# Patient Record
Sex: Female | Born: 1955 | Race: White | Hispanic: No | Marital: Married | Smoking: Former smoker
Health system: Southern US, Community
[De-identification: ages and names within clinical notes are randomized; demographics above are authoritative.]

## PROBLEM LIST (undated history)

## (undated) DIAGNOSIS — N3946 Mixed incontinence: Secondary | ICD-10-CM

## (undated) DIAGNOSIS — Z7189 Other specified counseling: Secondary | ICD-10-CM

## (undated) DIAGNOSIS — R232 Flushing: Secondary | ICD-10-CM

## (undated) DIAGNOSIS — E039 Hypothyroidism, unspecified: Secondary | ICD-10-CM

## (undated) DIAGNOSIS — B369 Superficial mycosis, unspecified: Secondary | ICD-10-CM

## (undated) DIAGNOSIS — R4589 Other symptoms and signs involving emotional state: Secondary | ICD-10-CM

## (undated) DIAGNOSIS — Z8489 Family history of other specified conditions: Secondary | ICD-10-CM

## (undated) DIAGNOSIS — M1990 Unspecified osteoarthritis, unspecified site: Secondary | ICD-10-CM

## (undated) DIAGNOSIS — F32A Depression, unspecified: Secondary | ICD-10-CM

## (undated) DIAGNOSIS — Z9889 Other specified postprocedural states: Secondary | ICD-10-CM

## (undated) DIAGNOSIS — E119 Type 2 diabetes mellitus without complications: Secondary | ICD-10-CM

## (undated) DIAGNOSIS — R7301 Impaired fasting glucose: Secondary | ICD-10-CM

## (undated) DIAGNOSIS — E65 Localized adiposity: Secondary | ICD-10-CM

## (undated) DIAGNOSIS — F329 Major depressive disorder, single episode, unspecified: Secondary | ICD-10-CM

## (undated) DIAGNOSIS — N816 Rectocele: Secondary | ICD-10-CM

## (undated) DIAGNOSIS — E669 Obesity, unspecified: Secondary | ICD-10-CM

## (undated) DIAGNOSIS — I10 Essential (primary) hypertension: Secondary | ICD-10-CM

## (undated) DIAGNOSIS — R0602 Shortness of breath: Secondary | ICD-10-CM

## (undated) DIAGNOSIS — K219 Gastro-esophageal reflux disease without esophagitis: Secondary | ICD-10-CM

## (undated) DIAGNOSIS — R112 Nausea with vomiting, unspecified: Secondary | ICD-10-CM

## (undated) DIAGNOSIS — C539 Malignant neoplasm of cervix uteri, unspecified: Secondary | ICD-10-CM

## (undated) DIAGNOSIS — J45909 Unspecified asthma, uncomplicated: Secondary | ICD-10-CM

## (undated) DIAGNOSIS — E7800 Pure hypercholesterolemia, unspecified: Secondary | ICD-10-CM

## (undated) DIAGNOSIS — N393 Stress incontinence (female) (male): Secondary | ICD-10-CM

## (undated) HISTORY — DX: Superficial mycosis, unspecified: B36.9

## (undated) HISTORY — DX: Impaired fasting glucose: R73.01

## (undated) HISTORY — DX: Mixed incontinence: N39.46

## (undated) HISTORY — DX: Rectocele: N81.6

## (undated) HISTORY — DX: Other specified counseling: Z71.89

## (undated) HISTORY — DX: Essential (primary) hypertension: I10

## (undated) HISTORY — DX: Localized adiposity: E65

## (undated) HISTORY — PX: HAMMER TOE SURGERY: SHX385

## (undated) HISTORY — PX: JOINT REPLACEMENT: SHX530

## (undated) HISTORY — DX: Other symptoms and signs involving emotional state: R45.89

## (undated) HISTORY — DX: Obesity, unspecified: E66.9

## (undated) HISTORY — PX: KNEE ARTHROSCOPY: SUR90

## (undated) HISTORY — PX: TUBAL LIGATION: SHX77

## (undated) HISTORY — DX: Stress incontinence (female) (male): N39.3

## (undated) HISTORY — DX: Pure hypercholesterolemia, unspecified: E78.00

## (undated) HISTORY — PX: COLONOSCOPY: SHX174

## (undated) HISTORY — PX: FUSION OF TALONAVICULAR JOINT: SHX6332

## (undated) HISTORY — PX: BACK SURGERY: SHX140

## (undated) HISTORY — DX: Flushing: R23.2

---
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
## 1984-03-27 HISTORY — PX: ABDOMINAL HYSTERECTOMY: SHX81

## 1999-04-20 ENCOUNTER — Other Ambulatory Visit: Admission: RE | Admit: 1999-04-20 | Discharge: 1999-04-20 | Payer: Self-pay | Admitting: Obstetrics and Gynecology

## 2001-04-19 ENCOUNTER — Ambulatory Visit (HOSPITAL_COMMUNITY): Admission: RE | Admit: 2001-04-19 | Discharge: 2001-04-19 | Payer: Self-pay | Admitting: Obstetrics and Gynecology

## 2001-04-19 ENCOUNTER — Encounter: Payer: Self-pay | Admitting: Obstetrics and Gynecology

## 2001-04-19 ENCOUNTER — Other Ambulatory Visit: Admission: RE | Admit: 2001-04-19 | Discharge: 2001-04-19 | Payer: Self-pay | Admitting: Obstetrics and Gynecology

## 2001-07-05 ENCOUNTER — Ambulatory Visit (HOSPITAL_COMMUNITY): Admission: RE | Admit: 2001-07-05 | Discharge: 2001-07-05 | Payer: Self-pay | Admitting: Family Medicine

## 2001-07-05 ENCOUNTER — Encounter: Payer: Self-pay | Admitting: Family Medicine

## 2002-03-27 HISTORY — PX: LUMBAR DISC SURGERY: SHX700

## 2002-06-03 ENCOUNTER — Ambulatory Visit (HOSPITAL_COMMUNITY): Admission: RE | Admit: 2002-06-03 | Discharge: 2002-06-03 | Payer: Self-pay | Admitting: Family Medicine

## 2003-02-04 ENCOUNTER — Ambulatory Visit (HOSPITAL_COMMUNITY): Admission: RE | Admit: 2003-02-04 | Discharge: 2003-02-04 | Payer: Self-pay | Admitting: Family Medicine

## 2003-02-23 ENCOUNTER — Inpatient Hospital Stay (HOSPITAL_COMMUNITY): Admission: RE | Admit: 2003-02-23 | Discharge: 2003-02-24 | Payer: Self-pay | Admitting: Neurosurgery

## 2003-03-23 ENCOUNTER — Ambulatory Visit (HOSPITAL_COMMUNITY): Admission: RE | Admit: 2003-03-23 | Discharge: 2003-03-23 | Payer: Self-pay | Admitting: Obstetrics and Gynecology

## 2003-06-03 ENCOUNTER — Ambulatory Visit (HOSPITAL_COMMUNITY): Admission: RE | Admit: 2003-06-03 | Discharge: 2003-06-03 | Payer: Self-pay | Admitting: Neurosurgery

## 2003-06-15 ENCOUNTER — Ambulatory Visit (HOSPITAL_COMMUNITY): Admission: RE | Admit: 2003-06-15 | Discharge: 2003-06-15 | Payer: Self-pay | Admitting: Neurosurgery

## 2003-06-23 ENCOUNTER — Ambulatory Visit (HOSPITAL_COMMUNITY): Admission: RE | Admit: 2003-06-23 | Discharge: 2003-06-23 | Payer: Self-pay | Admitting: Neurosurgery

## 2003-07-06 ENCOUNTER — Encounter: Admission: RE | Admit: 2003-07-06 | Discharge: 2003-07-06 | Payer: Self-pay | Admitting: Infectious Diseases

## 2003-07-08 ENCOUNTER — Ambulatory Visit (HOSPITAL_COMMUNITY): Admission: RE | Admit: 2003-07-08 | Discharge: 2003-07-08 | Payer: Self-pay | Admitting: Infectious Diseases

## 2003-07-10 ENCOUNTER — Ambulatory Visit (HOSPITAL_COMMUNITY): Admission: RE | Admit: 2003-07-10 | Discharge: 2003-07-10 | Payer: Self-pay | Admitting: Infectious Diseases

## 2003-07-28 ENCOUNTER — Encounter: Admission: RE | Admit: 2003-07-28 | Discharge: 2003-07-28 | Payer: Self-pay | Admitting: Infectious Diseases

## 2003-08-18 ENCOUNTER — Encounter: Admission: RE | Admit: 2003-08-18 | Discharge: 2003-08-18 | Payer: Self-pay | Admitting: Infectious Diseases

## 2003-08-25 ENCOUNTER — Encounter: Admission: RE | Admit: 2003-08-25 | Discharge: 2003-08-25 | Payer: Self-pay | Admitting: Infectious Diseases

## 2003-10-07 ENCOUNTER — Encounter: Admission: RE | Admit: 2003-10-07 | Discharge: 2003-10-07 | Payer: Self-pay | Admitting: Infectious Diseases

## 2003-11-10 ENCOUNTER — Encounter: Admission: RE | Admit: 2003-11-10 | Discharge: 2003-11-10 | Payer: Self-pay | Admitting: Infectious Diseases

## 2004-01-06 ENCOUNTER — Ambulatory Visit: Payer: Self-pay | Admitting: Infectious Diseases

## 2004-02-04 ENCOUNTER — Ambulatory Visit (HOSPITAL_COMMUNITY): Admission: RE | Admit: 2004-02-04 | Discharge: 2004-02-04 | Payer: Self-pay | Admitting: Family Medicine

## 2004-02-16 ENCOUNTER — Ambulatory Visit (HOSPITAL_COMMUNITY): Admission: RE | Admit: 2004-02-16 | Discharge: 2004-02-16 | Payer: Self-pay | Admitting: Family Medicine

## 2004-05-18 ENCOUNTER — Ambulatory Visit: Payer: Self-pay | Admitting: Infectious Diseases

## 2006-03-12 ENCOUNTER — Ambulatory Visit (HOSPITAL_COMMUNITY): Admission: RE | Admit: 2006-03-12 | Discharge: 2006-03-12 | Payer: Self-pay | Admitting: Family Medicine

## 2006-06-19 ENCOUNTER — Ambulatory Visit (HOSPITAL_COMMUNITY): Admission: RE | Admit: 2006-06-19 | Discharge: 2006-06-19 | Payer: Self-pay | Admitting: Gastroenterology

## 2006-06-19 ENCOUNTER — Ambulatory Visit: Payer: Self-pay | Admitting: Gastroenterology

## 2007-04-12 ENCOUNTER — Encounter: Admission: RE | Admit: 2007-04-12 | Discharge: 2007-04-12 | Payer: Self-pay | Admitting: Neurosurgery

## 2007-04-18 ENCOUNTER — Ambulatory Visit (HOSPITAL_COMMUNITY): Admission: RE | Admit: 2007-04-18 | Discharge: 2007-04-18 | Payer: Self-pay | Admitting: Family Medicine

## 2007-11-18 ENCOUNTER — Other Ambulatory Visit: Admission: RE | Admit: 2007-11-18 | Discharge: 2007-11-18 | Payer: Self-pay | Admitting: Obstetrics and Gynecology

## 2007-11-25 ENCOUNTER — Ambulatory Visit (HOSPITAL_COMMUNITY): Admission: RE | Admit: 2007-11-25 | Discharge: 2007-11-25 | Payer: Self-pay | Admitting: Obstetrics & Gynecology

## 2008-01-20 ENCOUNTER — Encounter: Payer: Self-pay | Admitting: Obstetrics and Gynecology

## 2008-01-20 ENCOUNTER — Observation Stay (HOSPITAL_COMMUNITY): Admission: RE | Admit: 2008-01-20 | Discharge: 2008-01-21 | Payer: Self-pay | Admitting: Obstetrics and Gynecology

## 2008-03-27 HISTORY — PX: BILATERAL SALPINGECTOMY: SHX5743

## 2010-01-25 ENCOUNTER — Other Ambulatory Visit: Admission: RE | Admit: 2010-01-25 | Discharge: 2010-01-25 | Payer: Self-pay | Admitting: Obstetrics & Gynecology

## 2010-06-01 ENCOUNTER — Other Ambulatory Visit (HOSPITAL_COMMUNITY): Payer: Self-pay | Admitting: Family Medicine

## 2010-06-01 DIAGNOSIS — Z139 Encounter for screening, unspecified: Secondary | ICD-10-CM

## 2010-06-03 ENCOUNTER — Ambulatory Visit (HOSPITAL_COMMUNITY)
Admission: RE | Admit: 2010-06-03 | Discharge: 2010-06-03 | Disposition: A | Discharge status: Home / Self Care | Payer: 59 | Source: Ambulatory Visit | Attending: Family Medicine | Admitting: Family Medicine

## 2010-06-03 DIAGNOSIS — Z139 Encounter for screening, unspecified: Secondary | ICD-10-CM

## 2010-06-03 DIAGNOSIS — Z1231 Encounter for screening mammogram for malignant neoplasm of breast: Secondary | ICD-10-CM | POA: Insufficient documentation

## 2010-08-09 NOTE — H&P (Signed)
[name], [name]                   ACCOUNT NO.:  [id number]   MEDICAL RECORD NO.:  [id number]          PATIENT TYPE:  AMB   LOCATION:  DAY                           FACILITY:  [facility]   PHYSICIAN:  Tilda Burrow, M.D. DATE OF BIRTH:  [birth year]   DATE OF ADMISSION:  DATE OF DISCHARGE:  LH                              HISTORY & PHYSICAL   ADMITTING DIAGNOSIS:  Right ovarian dermoid cyst.   HISTORY OF PRESENT ILLNESS:  This 55 year old female now status post  hysterectomy is scheduled for laparoscopic right salpingo-oophorectomy  and possible bilateral salpingo-oophorectomy at [facility] on  January 20, 2008.  Ultrasound has been performed, which shows a  characteristic appearance to an 1.1- x 1.5-cm hyperechoic area in the  right ovary consistent with an ovarian dermoid.  This side is  symptomatic in that she has some moderate discomfort on this side.  The  ultrasound was performed in followup from her annual visit in August  2009, which revealed some right adnexal tenderness, which on ultrasound  was found to be this dermoid cyst.  We have discussed the treatment  options for dermoid cyst including ovarian preservation by cystectomy.  The patient is not particularly interested in attempting to preserve the  right ovary because of the discomfort, which is likely related to  adhesions on that side as well as the dermoid.  Therefore, the plan is  to remove the right tube and ovary.  She would like to hold on to the  left ovary for hormone benefit unless distinct abnormalities were  identified.   PAST MEDICAL HISTORY:  Benign.   ALLERGIES:  None.   PAST SURGICAL HISTORY:  Back surgery, hysterectomy for cervical  abnormalities, and knee surgery.   SOCIAL HISTORY:  Nonsmoker, nondrinker, no drugs, employed at [employer], and married.   REVIEW OF SYSTEMS:  Notable for some mild stress incontinence and urge  incontinence, not responding to Kegel exercises.   PHYSICAL EXAMINATION:  GENERAL:  Healthy, large-framed Caucasian female.  VITAL SIGNS:  Weight 226.2, blood pressure 104/60, and pulse of 72.  HEENT:  Pupils equal, round, and reactive to light.  Extraocular  movements intact.  NECK:  Supple.  CHEST:  Clear to auscultation.  ABDOMEN:  Moderate abdominal wall thickness.  GU:  External genitalia multiparous.  Vaginal exam shows well-healed  surgical cuff with tenderness in the right adnexa and left adnexa is  without discomfort on bimanual.  EXTREMITIES:  Grossly normal.   IMPRESSION:  Right ovarian dermoid.   PLAN:  Laparoscopic right salpingo-oophorectomy on January 20, 2008.      Tilda Burrow, M.D.  Electronically Signed     JVF/MEDQ  D:  01/16/2008  T:  01/17/2008  Job:  [id number]   cc:   Donna Bernard, M.D.  Fax: [phone]   [facility]

## 2010-08-09 NOTE — Op Note (Signed)
[name], [name]                   ACCOUNT NO.:  [id number]   MEDICAL RECORD NO.:  [id number]          PATIENT TYPE:  INP   LOCATION:  A318                          FACILITY:  [facility]   PHYSICIAN:  Tilda Burrow, M.D. DATE OF BIRTH:  02/12/1956   DATE OF PROCEDURE:  DATE OF DISCHARGE:                               OPERATIVE REPORT   PREOPERATIVE DIAGNOSES:  1. Pelvic pain.  2. Dyspareunia.  3. Right ovarian dermoid cyst.   POSTOPERATIVE DIAGNOSES:  1. Bilateral ovarian cysts.  2. Extensive pelvic adhesions.   PROCEDURES:  1. Laparoscopic bilateral salpingo-oophorectomy.  2. Extensive lysis of pelvic adhesions.   SURGEON:  Tilda Burrow, MD   ASSISTANT:  Trenton Founds, RNFA   ANESTHESIA:  General.   COMPLICATIONS:  None.   FINDINGS:  1. Adhesions from distal portion of right tube to vaginal cuff.  2. Adhesions from right side to lateral side of the sigmoid colon.  3. Extensive adhesions surrounding and encapsulating the left tube and      ovary including what was probably a left hydrosalpinx.   DETAILS OF PROCEDURE:  The patient was taken to the operating room,  prepped and draped for combined abdominal and vaginal procedure with  Foley catheter in place and single-tooth tenaculum attached to the  vaginal cuff for orientation.  Attention was directed to the abdomen,  where after time-out was completed, an infraumbilical vertical 1-cm skin  incision was made as well as a transverse 2-cm suprapubic incisions and  smaller incisions in the right lower quadrant just lateral to the  umbilicus.  Infraumbilical incision was used to introduce Veress needle  with loss of resistance technique used to confirm intraperitoneal  rotation and confirmed by water droplet technique.  Pneumoperitoneum  achieved using 16 mmHg pressure and 3 liters of CO2 infused.  Laparoscopic umbilical trocar was introduced without difficulty and  pelvis inspected.  There was rather significant amount  of filmy  adhesions to the anterior abdominal wall, particular on the left side,  from the umbilical site down to the left iliac crest.  There were also  few adhesions in the pelvis.  The patient was placed in sharp  Trendelenburg position and attention directed to the pelvic adhesions.  The right lower quadrant port was used to access the pelvis with  Harmonic scalpel and suprapubic trocar was used for traction and  countertraction.  Omental adhesions to the anterior abdominal wall were  taken down two-thirds away across into the left pelvis, with all the  suprapubic filmy adhesions removed.  Attention was directed to the  bowel, which could be freed up from some thin adhesions to the right  adnexa.  At this point, the tube and ovary could be identified and  gradually freed up from some thin adhesions to the mesoappendix and also  to the right side of the sigmoid colon.  These adhesions were taken down  primarily with Harmonic scalpel, transection through thin filmy adhesion  sites were identifiable, and traction and countertraction went over  adjacent to bowel.  Procedure went very nicely with good  hemostasis.  The tubes have been quite mobile and was felt to be well out of the way  of the ureter even though the peritoneal surface was thickened out of  it.  The actual ureter peristalsis could not be visualized.  Using  Harmonic scalpel and remaining close to the ovary, which was pulled out  from a sidewall on retraction, we took small bites and took down the  right tube and ovary removing them as a specimen and depositing them in  the abdomen and pelvic cuff.  The photo was taken just before completion  of resection of this.  Hemostasis was quite good.  Attention was then  directed to the remainder of the pelvis.  Seeing into the left lower  quadrant was more challenging due to some omental adhesions that still  existed.  By using a 5-mm trocar in the suprapubic site, we were able to   access the omental adhesions from a different angle and take down  approximately 90% of the pelvic adhesions in the left lower quadrant.  Attention was then directed to the sigmoid colon, which was adherent and  overlying the left tube and ovary.  Using laparoscopic Kitner dissection  device and gentle countertraction, we were able to identify natural  cleavage planes between the left adnexal structures and the bowel,  moving the bowel away as adequately.  At this point, a large  hydrosalpinx could be seen on the left as well as the left ovary  adhering were some thin fibrofatty adhesions to the pelvic floor.  The  laparoscopic grasping device was used to place the left tube and ovary  on traction and countertraction and gradually dissection around this  tube and ovary were successful identified restoring relative normal  appearance to the adnexal anatomy.  Hydrosalpinx drained spontaneously  during manipulation.  The left ovary could not be visualized adequately  against the sidewall and again laparoscopic Kitner dissectors were used  to mobilize the tube and ovary on the sides.  Once the infundibulopelvic  ligament can be successfully evaluated, identified, and be held well  away from the pelvic sidewall, Harmonic scalpel was used to serially  clamp and coagulate across to the left infundibulopelvic ligament.  The  retroperitoneum was entered on the left side by placing ovary on  traction and using Harmonic scalpel to open up the retroperitoneal space  at the level of the remnant of the left round ligament.  I then could  use Kitner dissector to mobilize the inferior aspect of the left tube  and peel the tube and ovary away from the sidewall sufficiently.  Then,  we could safely transect the left infundibulopelvic ligament with small  bites, being careful to stay close of the ovary and well off the  sidewall.  At no time was the ureter considered to be at risk and effort  was  continuously made to stay safe.  The hemostasis was quite good at  the end of the procedure.  Laparoscopic EndoCatch bag replaced to the  umbilical site, which was converted to a 12-mm port, and the specimen  was removed and sent to Histology.  Sponge and needle counts were  correct.  Estimated blood loss less than 100 mL, tolerated well by the  patient.      Tilda Burrow, M.D.  Electronically Signed     JVF/MEDQ  D:  01/20/2008  T:  01/21/2008  Job:  [id number]

## 2010-08-12 NOTE — Op Note (Signed)
[name], [name]                             ACCOUNT NO.:  [id number]   MEDICAL RECORD NO.:  [id number]                   PATIENT TYPE:  INP   LOCATION:  2866                                 FACILITY:  [facility]   PHYSICIAN:  Hewitt Shorts, M.D.            DATE OF BIRTH:  06-May-1955   DATE OF PROCEDURE:  02/23/2003  DATE OF DISCHARGE:                                 OPERATIVE REPORT   PREOPERATIVE DIAGNOSES:  1. 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  2. Lumbar degenerative disk disease.  3. Lumbar spondylosis.  4. Lumbar radiculopathy.   POSTOPERATIVE DIAGNOSES:  1. 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  2. Lumbar degenerative disk disease.  3. Lumbar spondylosis.  4. Lumbar radiculopathy.   PROCEDURE:  Left L4-5 lumbar laminotomy and microdiskectomy with micro-  dissection.   SURGEON:  Hewitt Shorts, M.D.   ASSISTANT:  Danae Orleans. Venetia Maxon, M.D.   ANESTHESIA:  General endotracheal.   INDICATIONS FOR PROCEDURE:  The patient is a 55 year old woman who presented  with left lumbar radiculopathy, who was found by MRI scan to have a left L4-  5 lumbar disk herniation, superimposed upon underlying degenerative disk  disease and spondylosis.  The decision was made to proceed with an elective  laminotomy and a microdiskectomy.   DESCRIPTION OF PROCEDURE:  The patient was brought to the operating room and  placed under general endotracheal anesthesia.  The patient was turned to the  prone position and the lumbar region was prepped with DuraPrep and draped in  a sterile fashion.  The midline was infiltrated with local anesthetic with  epinephrine.  An x-ray was taken and the L4-5 level identified and a midline  incision made over the L4-5 level, and carried down through the subcutaneous  tissue.  Bipolar cautery and electrocautery were used to maintain  hemostasis.  Dissection was carried down to the lumbar fascia which was  incised on the left side of the midline, and the paraspinous  muscle was  dissected from the spinous process and lamina in a subperiosteal fashion.  The L4-5 interlaminar space was identified.  A self-retaining retractor was  placed and an x-ray was taken to confirm the localization.  Once that was  confirmed, we proceeded with a laminotomy using the [facility] drill along  with Kerrison punches.  The ligament of flavum was carefully resected.  The  microscope was draped and brought on to the field to provide magnification,  illumination and visualization.  The remainder of the decompression was  performed using micro-dissection and micro-surgical technique.  We  identified the thecal sac and left L5 nerve root.  The foramen was  identified, as well as the annulus.  There was a sub-ligamentous disk  herniation.  The overlying epidural veins were coagulated as necessary.  Then we incised the annulus and proceeded with a thorough diskectomy,  removing the disk fragments and decompressing the thecal  sac and nerve  roots.  There was mild irritability of the left L5 nerve root.  A thorough  diskectomy was performed removing all loose fragments of disk material from  both the disk space and the epidural space.  Hemostasis was done, which was  achieved with bipolar cautery.  Once the decompression was completed and  hemostasis was established, we did carefully examine the foramen, as well as  the ventral epidural space, and it was felt once again that good  decompression had been achieved, and all loose fragments of disk material  were removed.  We then proceeded with closure.  Prior to closure 2 mL of  fentanyl and 80 mg of Depo-Medrol were infused into the epidural space.  The  deep fascia was closed with interrupted undyed #1 Vicryl sutures, the deep  subcutaneous tissue was approximated with interrupted and inverted undyed #1  Vicryl sutures, and the subcutaneous and subcuticular layers were closed  with interrupted inverted #2-0 undyed Vicryl sutures,  and the skin was  closed with Dermabond.  The patient tolerated the procedure well.  The estimated blood loss was 25  mL.  The sponge and needle counts were correct.  Following surgery the patient is to be turned back to a supine position,  reversed from anesthetic, extubated and transferred to the recovery room for  further care.                                               Hewitt Shorts, M.D.    RWN/MEDQ  D:  02/23/2003  T:  02/23/2003  Job:  [id number]

## 2010-08-12 NOTE — Op Note (Signed)
NAMESINCLAIRE, ARTIGA                   ACCOUNT NO.:  [id number]   MEDICAL RECORD NO.:  [id number]          PATIENT TYPE:  AMB   LOCATION:  DAY                           FACILITY:  [facility]   PHYSICIAN:  Kassie Mends, M.D.      DATE OF BIRTH:  1955-04-02   DATE OF PROCEDURE:  06/19/2006  DATE OF DISCHARGE:                               OPERATIVE REPORT   PROCEDURE:  Colonoscopy.   INDICATIONS FOR EXAM:  Ms. Degan is a 55 year old female who presents for  average-risk colon cancer screening.   FINDINGS:  1. Normal colon.  2. Sigmoid colon diverticulosis.  Otherwise normal colon, without      evidence of polyps, masses, inflammatory changes, or arteriovenous      malformations.  3. Normal retroflexed view of the rectum.   RECOMMENDATIONS:  1. High-fiber diet.  Ms. Yilmaz is given a handout on diverticulosis and      high-fiber diet.  2. Screening colonoscopy in 10 years.  3. Follow up with Dr. Donna Bernard.   MEDICATIONS:  1. Demerol 100 mg IV.  2. Versed 6 mg IV.   PROCEDURE TECHNIQUE:  Physical exam was performed, and informed consent  was obtained from the patient after explaining the benefits, risks, and  alternatives to the procedure.  The patient was connected to the monitor  and placed in the left lateral position.  Continuous oxygen was provided  by nasal cannula, and IV medicine administered through an indwelling  cannula.  After administration, sedation, and rectal exam, the patient's  rectum was intubated,  and the scope was advanced under direct visualization to the cecum.  The  scope was subsequently removed slowly by carefully examining the color,  texture, anatomy, and integrity of the mucosa on the way out.  The  patient was recovered in the endoscopy suite and discharged to home in  satisfactory condition.      Kassie Mends, M.D.  Electronically Signed     SM/MEDQ  D:  06/19/2006  T:  06/19/2006  Job:  [id number]   cc:   Donna Bernard, M.D.  Fax: [phone]

## 2010-08-12 NOTE — Procedures (Signed)
   NAMESKYLER, DUSING                             ACCOUNT NO.:  [id number]   MEDICAL RECORD NO.:  [id number]                   PATIENT TYPE:  OUT   LOCATION:  DFTL                                 FACILITY:  [facility]   PHYSICIAN:  Donna Bernard, M.D.             DATE OF BIRTH:  1955-05-06   DATE OF PROCEDURE:  06/03/2002  DATE OF DISCHARGE:                                    STRESS TEST   PROCEDURE:  Stress test.   INDICATIONS FOR TEST:  This patient is a 55 year old white female with a  strong family history of coronary artery disease.  She also has a personal  history of hyperlipidemia and has had experiences of excessive shortness of  breath and at times discomfort with exertion.  Stress test is performed for  further stratification and risk.   TEST DATA:  Stress test was performed at standard Bruce protocol.  Resting  EKG revealed no ST abnormalities and normal sinus rhythm.  The patient  tolerated the first stages well.  Towards the end of the second stage, she  reached her submax predicted heart rate of 138.  The patient exercised for a  minute and a half into the third stage, achieving a maximal heart rate of  151 while exceeding her submax predicted heart rate.  At this point, there  were no significant changes in her ST segment.   IMPRESSION:  Negative adequate stress test.   PLAN:  The patient encouraged to proceed with regular exercise program.                                               Donna Bernard, M.D.    WSL/MEDQ  D:  06/03/2002  T:  06/03/2002  Job:  [id number]

## 2010-12-27 LAB — DIFFERENTIAL
Basophils Absolute: 0
Basophils Relative: 0
Eosinophils Absolute: 0
Eosinophils Relative: 0
Lymphocytes Relative: 14
Lymphs Abs: 1.7
Monocytes Absolute: 1
Monocytes Relative: 8
Neutro Abs: 9.6 — ABNORMAL HIGH
Neutrophils Relative %: 78 — ABNORMAL HIGH

## 2010-12-27 LAB — COMPREHENSIVE METABOLIC PANEL
ALT: 16
AST: 28
Albumin: 4.1
Alkaline Phosphatase: 60
BUN: 12
CO2: 23
Calcium: 8.8
Chloride: 104
Creatinine, Ser: 0.66
GFR calc Af Amer: >60
GFR calc non Af Amer: >60
Glucose, Bld: 110 — ABNORMAL HIGH
Potassium: 4.2
Sodium: 136
Total Bilirubin: 0.9
Total Protein: 7

## 2010-12-27 LAB — CBC
HCT: 35.4 — ABNORMAL LOW
HCT: 41.2
Hemoglobin: 12.2
Hemoglobin: 14.2
MCHC: 34.5
MCHC: 34.5
MCV: 89.7
MCV: 90.3
Platelets: 265
Platelets: 284
RBC: 3.92
RBC: 4.59
RDW: 12.5
RDW: 12.7
WBC: 12.3 — ABNORMAL HIGH
WBC: 9.7

## 2010-12-27 LAB — BASIC METABOLIC PANEL
BUN: 7
CO2: 27
Calcium: 8.7
Chloride: 105
Creatinine, Ser: 0.76
GFR calc Af Amer: >60
GFR calc non Af Amer: >60
Glucose, Bld: 150 — ABNORMAL HIGH
Potassium: 4.4
Sodium: 137

## 2011-03-28 HISTORY — PX: TOTAL KNEE ARTHROPLASTY: SHX125

## 2011-06-27 ENCOUNTER — Other Ambulatory Visit: Payer: Self-pay | Admitting: Family Medicine

## 2011-06-27 DIAGNOSIS — Z139 Encounter for screening, unspecified: Secondary | ICD-10-CM

## 2011-06-29 ENCOUNTER — Other Ambulatory Visit: Payer: Self-pay | Admitting: Adult Health

## 2011-06-29 ENCOUNTER — Other Ambulatory Visit (HOSPITAL_COMMUNITY)
Admission: RE | Admit: 2011-06-29 | Discharge: 2011-06-29 | Disposition: A | Discharge status: Home / Self Care | Payer: 59 | Source: Ambulatory Visit | Attending: Obstetrics and Gynecology | Admitting: Obstetrics and Gynecology

## 2011-06-29 DIAGNOSIS — Z01419 Encounter for gynecological examination (general) (routine) without abnormal findings: Secondary | ICD-10-CM | POA: Insufficient documentation

## 2011-06-30 ENCOUNTER — Ambulatory Visit (HOSPITAL_COMMUNITY)
Admission: RE | Admit: 2011-06-30 | Discharge: 2011-06-30 | Disposition: A | Discharge status: Home / Self Care | Payer: 59 | Source: Ambulatory Visit | Attending: Family Medicine | Admitting: Family Medicine

## 2011-06-30 DIAGNOSIS — Z139 Encounter for screening, unspecified: Secondary | ICD-10-CM

## 2011-06-30 DIAGNOSIS — Z1231 Encounter for screening mammogram for malignant neoplasm of breast: Secondary | ICD-10-CM | POA: Insufficient documentation

## 2011-08-28 ENCOUNTER — Ambulatory Visit (HOSPITAL_COMMUNITY)
Admission: RE | Admit: 2011-08-28 | Discharge: 2011-08-28 | Disposition: A | Discharge status: Home / Self Care | Payer: 59 | Source: Ambulatory Visit | Attending: Orthopedic Surgery | Admitting: Orthopedic Surgery

## 2011-08-28 DIAGNOSIS — IMO0001 Reserved for inherently not codable concepts without codable children: Secondary | ICD-10-CM | POA: Insufficient documentation

## 2011-08-28 DIAGNOSIS — M25562 Pain in left knee: Secondary | ICD-10-CM | POA: Insufficient documentation

## 2011-08-28 DIAGNOSIS — R262 Difficulty in walking, not elsewhere classified: Secondary | ICD-10-CM | POA: Insufficient documentation

## 2011-08-28 DIAGNOSIS — M25662 Stiffness of left knee, not elsewhere classified: Secondary | ICD-10-CM | POA: Insufficient documentation

## 2011-08-28 DIAGNOSIS — M25669 Stiffness of unspecified knee, not elsewhere classified: Secondary | ICD-10-CM | POA: Insufficient documentation

## 2011-08-28 DIAGNOSIS — M25569 Pain in unspecified knee: Secondary | ICD-10-CM | POA: Insufficient documentation

## 2011-08-28 NOTE — Evaluation (Signed)
Physical Therapy Evaluation  Patient Details  Name: NOVAH NESSEL MRN: [id number] Date of Birth: 11-06-1955  Today's Date: 08/28/2011 Time: 1010-1050 PT Time Calculation (min): 40 min  Visit#: 1  of 8   Re-eval: 09/27/11 Assessment Diagnosis: menisectomy Surgical Date: 08/08/11 Next MD Visit: 09/05/2011 Prior Therapy: none   Past Medical History: No past medical history on file. Past Surgical History: No past surgical history on file.  Subjective Symptoms/Limitations Symptoms: Ms. Barretto states that she had arthroscopic surgery for a meniscus tear on her L knee on 08/08/2011.  The patient states that she has good days and bad days and  night time seems to be the worst for her.  She is currently being referred to PT to maximize her functional potential. Pertinent History: The patient states she has had arthroscopic surgery on her R knee in 2006.  She states she is waiting for a TKR on the right.  She states that the pain in her left knee came on suddenly  she was working on her knees quite a bit and feels that this is what caused her injury. How long can you sit comfortably?: She states that she is able to sit for about 30 to 40 minutes. How long can you stand comfortably?: She states that she is able to stand for no more than fifteen minutes. How long can you walk comfortably?: The patient states that she is unable to walk more than 10 to 15 minutes.   She states she is now walking slower than normal. Pain Assessment Currently in Pain?: Yes Pain Score:   1 (worst pain has been 7/10) Pain Location: Knee Pain Orientation: Left Pain Type: Surgical pain Pain Onset: In the past 7 days Pain Frequency: Intermittent Pain Relieving Factors: elevating; cryocuff. 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 Leisure: Hobbies-no  Cognition/Observation Cognition Overall Cognitive Status: Appears within functional limits for tasks assessed  Sensation/Coordination/Flexibility/Functional Tests Functional Tests Functional Tests: LEFS 45/80 Functional Tests: quad length  foot 41/2 " from buttock. Functional Tests: PSLR R + 85 L + 80  Assessment LLE AROM (degrees) Left Knee Extension 0-130: 7  Left Knee Flexion 0-140: 125  LLE Strength Left Hip Flexion: 5/5 Left Hip Extension: 3+/5 Left Hip ABduction: 5/5 Left Hip ADduction: 5/5 Left Knee Flexion: 4/5 Left Knee Extension: 5/5 Left Ankle Dorsiflexion: 5/5  Exercise/Treatments    Stretches Active Hamstring Stretch: 3 reps;30 seconds Quad Stretch: 2 reps;30 seconds    Supine Quad Sets: 10 reps   Prone  Hamstring Curl: 10 reps;Limitations Hamstring Curl Limitations: 3# Hip Extension: Left;10 reps      Physical Therapy Assessment and Plan PT Assessment and Plan Clinical Impression Statement: Pt with increased pain, decreased strength and decreased activity tolerance who will benefit from skillled PT to return pt to work duties which require patient to be on her feet for 12 hr at a time,(pt believes her work will allow her to go back at 8hr days). Pt will benefit from skilled therapeutic intervention in order to improve on the following deficits: Decreased activity tolerance;Increased edema;Decreased range of motion;Pain Rehab Potential: Good PT Frequency: Min 2X/week PT Duration: 4 weeks PT Treatment/Interventions: Therapeutic activities;Therapeutic exercise PT Plan: begin rockerboard, standing knee flexion, sls, standing terminal extension, minis quats,  lateral, forward step ups and bike next treatment.      Goals Home Exercise Program Pt will Perform Home Exercise Program: Independently PT Short Term Goals Time to Complete Short Term Goals: 2 weeks PT Short  Term Goal 1: ROM 0-130 to allow normalized gt PT Short Term Goal 2: Pt to be able to sit for an hour at a time to watch an hour tv show. PT Short Term Goal 3: Pt to be able to stand for 20 minutes without increased pain to be able to wait comfortablly in lines PT Short Term Goal 4: pain no greater than a 4 PT Long Term Goals Time to Complete Long Term Goals: 4 weeks PT Long Term Goal 1: Pain no greater than a 2 PT Long Term Goal 2: Pt to be able to be on her feet for three hours at a time to allow pt to return to work. Long Term Goal 3: Pt to be able to sit for two hours at a time to enjoy a movie or traveling without increased pain  Problem List Patient Active Problem List  Diagnoses  . Stiffness of joint, lower leg, left  . Difficulty in walking  . Pain in left knee   PT - End of Session Activity Tolerance: Patient tolerated treatment well General Behavior During Session: [facility] for tasks performed Cognition: [facility] for tasks performed PT Plan of Care PT Home Exercise Plan: given  Alexia Dinger,CINDY 08/28/2011, 11:07 AM  Physician Documentation Your signature is required to indicate approval of the treatment plan as stated above.  Please sign and either send electronically or make a copy of this report for your files and return this physician signed original.   Please mark one 1.__approve of plan  2. ___approve of plan with the following conditions.   ______________________________                                                          _____________________ Physician Signature                                                                                                             Date

## 2011-08-31 ENCOUNTER — Ambulatory Visit (HOSPITAL_COMMUNITY)
Admission: RE | Admit: 2011-08-31 | Discharge: 2011-08-31 | Disposition: A | Discharge status: Home / Self Care | Payer: 59 | Source: Ambulatory Visit | Attending: Orthopedic Surgery | Admitting: Orthopedic Surgery

## 2011-08-31 NOTE — Progress Notes (Signed)
Physical Therapy Treatment Patient Details  Name: [name] MRN: [id number] Date of Birth: 07-24-1955  Today's Date: 08/31/2011 Time: 1030-1108 PT Time Calculation (min): 38 min Visit#: 2  of 8   Re-eval: 09/27/11 Charges:  therex 38'    Subjective: Symptoms/Limitations Symptoms: Pt. states she has been compliant with HEP.  Pt. states her knee is a little sore with 3/10 pain today in her L knee.  She is to discuss a TKA on the R knee with her doctor next week. Pain Assessment Currently in Pain?: Yes Pain Score:   3 Pain Location: Knee Pain Orientation: Left   Exercise/Treatments Aerobic Stationary Bike: 6'@1 .5 seat 9 Standing Heel Raises: 15 reps;Limitations Heel Raises Limitations: toeraises 15 reps Knee Flexion: 10 reps Lateral Step Up: 10 reps;Step Height: 4";Hand Hold: 1 Forward Step Up: 10 reps;Step Height: 4";Hand Hold: 1 Functional Squat: 10 reps Rocker Board: 2 minutes SLS: 15" max of 3 trials Supine Quad Sets: 10 reps Straight Leg Raises: 10 reps Prone  Hamstring Curl: 15 reps Hamstring Curl Limitations: 3# Hip Extension: 15 reps      Physical Therapy Assessment and Plan PT Assessment and Plan Clinical Impression Statement: Added new standing exercises and bike per POC all without difficulty or pain.  Pt. displays good form with little VC's needed.  Pt. was able to demonstrate HEP correctly.  Improving extension. PT Plan: Add TKE with theraband, forward step downs and progress exercises to increase strength.     Problem List Patient Active Problem List  Diagnoses  . Stiffness of joint, lower leg, left  . Difficulty in walking  . Pain in left knee    PT - End of Session Activity Tolerance: Patient tolerated treatment well General Behavior During Session: [facility] for tasks performed Cognition: [facility] for tasks performed  GP No functional reporting required  Lurena Nida, PTA/CLT 08/31/2011, 11:13 AM

## 2011-09-06 ENCOUNTER — Inpatient Hospital Stay (HOSPITAL_COMMUNITY): Admission: RE | Admit: 2011-09-06 | Payer: 59 | Source: Ambulatory Visit | Admitting: Physical Therapy

## 2011-09-07 ENCOUNTER — Ambulatory Visit (HOSPITAL_COMMUNITY): Payer: 59 | Admitting: Physical Therapy

## 2011-09-13 ENCOUNTER — Ambulatory Visit (HOSPITAL_COMMUNITY): Payer: 59 | Admitting: *Deleted

## 2011-09-14 ENCOUNTER — Ambulatory Visit (HOSPITAL_COMMUNITY): Payer: 59 | Admitting: *Deleted

## 2011-09-18 ENCOUNTER — Ambulatory Visit (HOSPITAL_COMMUNITY): Payer: 59 | Admitting: Physical Therapy

## 2011-09-21 ENCOUNTER — Ambulatory Visit (HOSPITAL_COMMUNITY): Payer: 59 | Admitting: *Deleted

## 2012-05-23 ENCOUNTER — Telehealth (HOSPITAL_COMMUNITY): Payer: Self-pay | Admitting: Dietician

## 2012-05-23 NOTE — Telephone Encounter (Signed)
Pt registered to attend [facility] Diabetes Class on 05/23/12. However, pt was a no-show for class.

## 2012-07-04 ENCOUNTER — Encounter: Payer: Self-pay | Admitting: *Deleted

## 2012-07-04 DIAGNOSIS — Z8541 Personal history of malignant neoplasm of cervix uteri: Secondary | ICD-10-CM | POA: Insufficient documentation

## 2012-07-04 DIAGNOSIS — I10 Essential (primary) hypertension: Secondary | ICD-10-CM

## 2012-07-05 ENCOUNTER — Ambulatory Visit (INDEPENDENT_AMBULATORY_CARE_PROVIDER_SITE_OTHER): Payer: 59 | Admitting: Adult Health

## 2012-07-05 ENCOUNTER — Other Ambulatory Visit (HOSPITAL_COMMUNITY)
Admission: RE | Admit: 2012-07-05 | Discharge: 2012-07-05 | Disposition: A | Discharge status: Home / Self Care | Payer: 59 | Source: Ambulatory Visit | Attending: Adult Health | Admitting: Adult Health

## 2012-07-05 ENCOUNTER — Encounter: Payer: Self-pay | Admitting: Adult Health

## 2012-07-05 VITALS — BP 130/86 | HR 80 | Ht 63.0 in | Wt 243.0 lb

## 2012-07-05 DIAGNOSIS — Z01419 Encounter for gynecological examination (general) (routine) without abnormal findings: Secondary | ICD-10-CM | POA: Insufficient documentation

## 2012-07-05 DIAGNOSIS — Z1151 Encounter for screening for human papillomavirus (HPV): Secondary | ICD-10-CM | POA: Insufficient documentation

## 2012-07-05 DIAGNOSIS — B369 Superficial mycosis, unspecified: Secondary | ICD-10-CM | POA: Insufficient documentation

## 2012-07-05 DIAGNOSIS — Z8541 Personal history of malignant neoplasm of cervix uteri: Secondary | ICD-10-CM

## 2012-07-05 DIAGNOSIS — I10 Essential (primary) hypertension: Secondary | ICD-10-CM

## 2012-07-05 DIAGNOSIS — Z1212 Encounter for screening for malignant neoplasm of rectum: Secondary | ICD-10-CM

## 2012-07-05 DIAGNOSIS — Z0000 Encounter for general adult medical examination without abnormal findings: Secondary | ICD-10-CM

## 2012-07-05 DIAGNOSIS — R232 Flushing: Secondary | ICD-10-CM

## 2012-07-05 DIAGNOSIS — N816 Rectocele: Secondary | ICD-10-CM

## 2012-07-05 HISTORY — DX: Rectocele: N81.6

## 2012-07-05 HISTORY — DX: Flushing: R23.2

## 2012-07-05 LAB — HEMOCCULT GUIAC POC 1CARD (OFFICE): Fecal Occult Blood, POC: NEGATIVE

## 2012-07-05 MED ORDER — NYSTATIN-TRIAMCINOLONE 100000-0.1 UNIT/GM-% EX CREA: TOPICAL_CREAM | Freq: Three times a day (TID) | CUTANEOUS | Status: DC

## 2012-07-05 MED ORDER — ESTRADIOL 0.52 MG/0.87 GM (0.06%) TD GEL: 1.0000 "application " | Freq: Every day | TRANSDERMAL | Status: DC

## 2012-07-05 NOTE — Patient Instructions (Addendum)
Try elestrin 1 pump to upper arm daily  Use mytrex cream to fungus Take benefiber and use wet wipes with BM, try  Zinc oxide Follow 3 months Sign up for my chart Mammogram due

## 2012-07-05 NOTE — Progress Notes (Signed)
Patient ID: [name], female   DOB: 1955-08-24, 57 y.o.   MRN: [id number] History of Present Illness:   [name] is a 57 year old white female in for a pap and physical. She is complaining of hot flashes and skin fungus.    Current Medications, Allergies, Past Medical History, Past Surgical History, Family History and Social History were reviewed in [employer] record.     Review of Systems:Patient denies any headaches, blurred vision, shortness of breath, chest pain, abdominal pain, problems with urination,  She is having 4-5 BMs per day and her rectal area itches. No mood changes. She has a skin fungus under breast and panniculus. She had right knee surgery and is back at work 4 hours per day. Reviewed past medical, surgical, social and family history. Reviewed medications and allergies.  Physical Exam:Blood pressure 130/86, pulse 80, height 5\' 3"  (1.6 m), weight 243 lb (110.224 kg). General:  Well developed, well nourished, no acute distress Skin:  Warm and dry Neck:  Midline trachea, normal thyroid Lungs; Clear to auscultation bilaterally Breast:  No dominant palpable mass, retraction, or nipple discharge, has skin fungus under breast Cardiovascular: Regular rate and rhythm Abdomen:  Soft, non tender, no hepatosplenomegaly, has skin fungus under panniculus. Painted area with gentian violet. Pelvic:  External genitalia is normal in appearance.  The vagina is normal in appearance. The cervix is absent.  Pap was performed with HPV. Uterus is absent.  No adnexal masses or tenderness noted. Rectal: Good sphincter tone, no polyps, felt, she does have a internal hemorrhoid and a rectocele.  Hemoccult negative. Extremities:  No swelling or varicosities noted Psych:  No mood changes  Impression: Yearly exam Skin fungus Hypertension Hot flashes Rectocele  Plan: Use mytrex  Cream and keep dry Try benefiber  And try wet wipes, can use zinc oxide prn. Trial of elestrin 1  pump to upper arm.Call with follow up of results Get mammogram now. Follow up in 1 year physcial

## 2012-07-22 ENCOUNTER — Other Ambulatory Visit: Payer: Self-pay | Admitting: Family Medicine

## 2012-07-23 ENCOUNTER — Encounter: Payer: Self-pay | Admitting: *Deleted

## 2012-09-16 ENCOUNTER — Other Ambulatory Visit: Payer: Self-pay | Admitting: Podiatry

## 2012-09-16 DIAGNOSIS — M7750 Other enthesopathy of unspecified foot: Secondary | ICD-10-CM

## 2012-09-18 ENCOUNTER — Ambulatory Visit
Admission: RE | Admit: 2012-09-18 | Discharge: 2012-09-18 | Disposition: A | Discharge status: Home / Self Care | Payer: 59 | Source: Ambulatory Visit | Attending: Podiatry | Admitting: Podiatry

## 2012-09-18 DIAGNOSIS — M7750 Other enthesopathy of unspecified foot: Secondary | ICD-10-CM

## 2012-10-07 ENCOUNTER — Ambulatory Visit: Payer: 59 | Admitting: Adult Health

## 2012-11-07 ENCOUNTER — Other Ambulatory Visit: Payer: Self-pay | Admitting: Family Medicine

## 2013-01-13 ENCOUNTER — Encounter: Payer: Self-pay | Admitting: Podiatry

## 2013-01-13 ENCOUNTER — Ambulatory Visit (INDEPENDENT_AMBULATORY_CARE_PROVIDER_SITE_OTHER): Payer: 59 | Admitting: Podiatry

## 2013-01-13 ENCOUNTER — Ambulatory Visit (INDEPENDENT_AMBULATORY_CARE_PROVIDER_SITE_OTHER): Payer: 59

## 2013-01-13 VITALS — BP 193/107 | HR 65 | Resp 16

## 2013-01-13 DIAGNOSIS — Z9889 Other specified postprocedural states: Secondary | ICD-10-CM

## 2013-01-13 DIAGNOSIS — M2040 Other hammer toe(s) (acquired), unspecified foot: Secondary | ICD-10-CM

## 2013-01-13 DIAGNOSIS — M7750 Other enthesopathy of unspecified foot: Secondary | ICD-10-CM

## 2013-01-13 NOTE — Progress Notes (Signed)
Subjective:     Patient ID: [name], female   DOB: 01-25-1956, 57 y.o.   MRN: [id number]  HPI patient states that she is getting pain in her ankle left still at this time. States that her toes are doing fine swelling still of the second toe left but does not affect shoe gear or work   Review of Systems  All other systems reviewed and are negative.       Objective:   Physical Exam  Nursing note and vitals reviewed. Cardiovascular: Intact distal pulses.   Musculoskeletal: Normal range of motion.  Neurological: She is alert.  Skin: Skin is warm.   Continued pain around the posterior tibial tendon left with mild skin discoloration within the area. No edema is noted. The second third and fourth toes are in good position with mild edema consistent with postop    Assessment:     Tendinitis posterior tibial tendon left. Healing surgical sites digits left    Plan:     Reviewed x-rays with the patient and recommended orthotics to lift the plantar arch up patient is scanned for custom orthotic devices

## 2013-01-17 ENCOUNTER — Telehealth: Payer: Self-pay | Admitting: *Deleted

## 2013-01-17 NOTE — Telephone Encounter (Signed)
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  Please fax to Clide Dales, company nurse at [phone].   See faxed note.

## 2013-02-04 ENCOUNTER — Other Ambulatory Visit: Payer: Self-pay | Admitting: Family Medicine

## 2013-02-26 ENCOUNTER — Telehealth: Payer: Self-pay | Admitting: Family Medicine

## 2013-02-26 DIAGNOSIS — E785 Hyperlipidemia, unspecified: Secondary | ICD-10-CM

## 2013-02-26 DIAGNOSIS — Z79899 Other long term (current) drug therapy: Secondary | ICD-10-CM

## 2013-02-26 NOTE — Telephone Encounter (Signed)
Patient needs BW paperwork °

## 2013-02-26 NOTE — Telephone Encounter (Signed)
bloodwork orders done. Pt notified.

## 2013-02-26 NOTE — Telephone Encounter (Signed)
Lip liv m7 

## 2013-03-01 LAB — BASIC METABOLIC PANEL
BUN: 18 mg/dL (ref 6–23)
CO2: 28 mEq/L (ref 19–32)
Calcium: 9.9 mg/dL (ref 8.4–10.5)
Creat: 0.8 mg/dL (ref 0.50–1.10)
Potassium: 4.4 mEq/L (ref 3.5–5.3)

## 2013-03-01 LAB — HEPATIC FUNCTION PANEL
AST: 20 U/L (ref 0–37)
Albumin: 4 g/dL (ref 3.5–5.2)
Alkaline Phosphatase: 62 U/L (ref 39–117)
Indirect Bilirubin: 0.3 mg/dL (ref 0.0–0.9)
Total Protein: 6.9 g/dL (ref 6.0–8.3)

## 2013-03-01 LAB — LIPID PANEL
HDL: 48 mg/dL (ref >39)
Triglycerides: 110 mg/dL (ref <150)

## 2013-03-04 ENCOUNTER — Other Ambulatory Visit: Payer: Self-pay | Admitting: Family Medicine

## 2013-03-04 NOTE — Telephone Encounter (Signed)
Not seen since Epic. 

## 2013-03-10 ENCOUNTER — Ambulatory Visit (INDEPENDENT_AMBULATORY_CARE_PROVIDER_SITE_OTHER): Payer: 59 | Admitting: Family Medicine

## 2013-03-10 ENCOUNTER — Encounter: Payer: Self-pay | Admitting: Family Medicine

## 2013-03-10 VITALS — BP 136/86 | Ht 63.0 in | Wt 251.4 lb

## 2013-03-10 DIAGNOSIS — R7301 Impaired fasting glucose: Secondary | ICD-10-CM | POA: Insufficient documentation

## 2013-03-10 DIAGNOSIS — E119 Type 2 diabetes mellitus without complications: Secondary | ICD-10-CM

## 2013-03-10 DIAGNOSIS — E785 Hyperlipidemia, unspecified: Secondary | ICD-10-CM

## 2013-03-10 DIAGNOSIS — I10 Essential (primary) hypertension: Secondary | ICD-10-CM

## 2013-03-10 NOTE — Progress Notes (Signed)
   Subjective:    Patient ID: [name], female    DOB: 1955-12-08, 57 y.o.   MRN: [id number]  HPI  Patient arrives for a follow up on hypertension. Trying to watch the salt, has cut down in diet. Does not miss bp med regularly. No obvious side effects.  Sticking with chol med. No sig s e s. Does not miss a dose. Exercising some but not a lot.   trying to not take in any sugarsand to go over recent lab results.  Realizes that her sugars have been going up lately. There is significant family history of obesity and sugar issues.  Results for orders placed in visit on 02/26/13  LIPID PANEL      Result Value Range   Cholesterol 181  0 - 200 mg/dL   Triglycerides 409  <811 mg/dL   HDL 48  >91 mg/dL   Total CHOL/HDL Ratio 3.8     VLDL 22  0 - 40 mg/dL   LDL Cholesterol 478 (*) 0 - 99 mg/dL  HEPATIC FUNCTION PANEL      Result Value Range   Total Bilirubin 0.4  0.3 - 1.2 mg/dL   Bilirubin, Direct 0.1  0.0 - 0.3 mg/dL   Indirect Bilirubin 0.3  0.0 - 0.9 mg/dL   Alkaline Phosphatase 62  39 - 117 U/L   AST 20  0 - 37 U/L   ALT 12  0 - 35 U/L   Total Protein 6.9  6.0 - 8.3 g/dL   Albumin 4.0  3.5 - 5.2 g/dL  BASIC METABOLIC PANEL      Result Value Range   Sodium 138  135 - 145 mEq/L   Potassium 4.4  3.5 - 5.3 mEq/L   Chloride 102  96 - 112 mEq/L   CO2 28  19 - 32 mEq/L   Glucose, Bld 137 (*) 70 - 99 mg/dL   BUN 18  6 - 23 mg/dL   Creat 2.95  6.21 - 3.08 mg/dL   Calcium 9.9  8.4 - 65.7 mg/dL      Review of Systems No headache no chest pain no back pain no change in bowel habits no blood in stool no rash ROS otherwise negative    Objective:   Physical Exam Alert HEENT normal. Vital stable. Blood pressure good on repeat. Lungs clear. Heart rare rhythm. Ankles without edema.       Assessment & Plan:  Impression 1 hypertension good control discussed. #2 hyperlipidemia recently good control discussed. #3 hyperglycemia worsening. A1c done. Unfortunately 6.0. Advised patient that  she is extremely close to diabetes. Long discussion held. Plan 25 minutes spent most in discussion. Maintain same medications. Meds refilled. Diet exercise discussed in encourage. WSL

## 2013-03-24 ENCOUNTER — Ambulatory Visit (INDEPENDENT_AMBULATORY_CARE_PROVIDER_SITE_OTHER): Payer: 59

## 2013-03-24 ENCOUNTER — Ambulatory Visit (INDEPENDENT_AMBULATORY_CARE_PROVIDER_SITE_OTHER): Payer: 59 | Admitting: Podiatry

## 2013-03-24 DIAGNOSIS — G8918 Other acute postprocedural pain: Secondary | ICD-10-CM

## 2013-03-24 DIAGNOSIS — M7750 Other enthesopathy of unspecified foot: Secondary | ICD-10-CM

## 2013-03-24 MED ORDER — TRIAMCINOLONE ACETONIDE 10 MG/ML IJ SUSP
10.0000 mg | Freq: Once | INTRAMUSCULAR | Status: AC
  Administered 2013-03-24: 10 mg

## 2013-03-24 NOTE — Progress Notes (Signed)
   Subjective:    Patient ID: [name], female    DOB: 24-Jun-1955, 57 y.o.   MRN: [id number]  HPIPt complains of shooting pain in left medial ankle for about 6 month.    Review of Systems     Objective:   Physical Exam        Assessment & Plan:

## 2013-03-24 NOTE — Progress Notes (Signed)
Subjective:     Patient ID: [name], female   DOB: [birth year], 57 y.o.   MRN: [id number]  HPI patient presents stating that my toe seems to be doing okay it's a little red it the and that my joint no longer hurts but I am having pain in my ankle that has not gotten better   Review of Systems     Objective:   Physical Exam Neurovascular status intact with discomfort around the posterior tibial tendon as it goes under the medial malleolus with a second toe left that is doing okay with slight redness distal but no significant other issues with well-healed second MPJ left    Assessment:     Doing well in the forefoot with slight stiffness of the second MPJ with mild edema and posterior tibial tendinitis left with no indication of muscle loss or tear    Plan:     Advised on boot usage for the posterior tib and I did do a careful injection 3 mg Kenalog 5 L like Marcaine mixture into the sheath explaining chances for rupture before doing this. For the forefoot we'll begin cortisone cream and stretching exercises and patient will reappoint 4 weeks to recheck

## 2013-03-30 ENCOUNTER — Other Ambulatory Visit: Payer: Self-pay | Admitting: Family Medicine

## 2013-04-21 ENCOUNTER — Ambulatory Visit (INDEPENDENT_AMBULATORY_CARE_PROVIDER_SITE_OTHER): Payer: 59

## 2013-04-21 ENCOUNTER — Encounter: Payer: 59 | Admitting: Podiatry

## 2013-04-21 ENCOUNTER — Ambulatory Visit: Payer: 59 | Admitting: Podiatry

## 2013-04-21 ENCOUNTER — Encounter: Payer: Self-pay | Admitting: *Deleted

## 2013-04-21 VITALS — BP 183/96 | HR 72 | Resp 18 | Ht 63.0 in | Wt 225.0 lb

## 2013-04-21 DIAGNOSIS — Z9889 Other specified postprocedural states: Secondary | ICD-10-CM

## 2013-04-21 DIAGNOSIS — M2040 Other hammer toe(s) (acquired), unspecified foot: Secondary | ICD-10-CM

## 2013-04-21 DIAGNOSIS — S96919A Strain of unspecified muscle and tendon at ankle and foot level, unspecified foot, initial encounter: Secondary | ICD-10-CM

## 2013-04-21 NOTE — Progress Notes (Signed)
Pt states the left medial foot still has tendon pain and the right in the ball of the foot has throbbing pain when walking.

## 2013-04-22 ENCOUNTER — Other Ambulatory Visit: Payer: Self-pay | Admitting: *Deleted

## 2013-04-22 DIAGNOSIS — R52 Pain, unspecified: Secondary | ICD-10-CM

## 2013-04-24 ENCOUNTER — Telehealth: Payer: Self-pay | Admitting: *Deleted

## 2013-04-24 ENCOUNTER — Ambulatory Visit
Admission: RE | Admit: 2013-04-24 | Discharge: 2013-04-24 | Disposition: A | Discharge status: Home / Self Care | Payer: 59 | Source: Ambulatory Visit | Attending: Podiatry | Admitting: Podiatry

## 2013-04-24 DIAGNOSIS — R52 Pain, unspecified: Secondary | ICD-10-CM

## 2013-04-24 NOTE — Telephone Encounter (Signed)
[employer] authorized MRI of Left ankle: [phone], valid for 45 days expiring 06/08/2013.

## 2013-04-30 ENCOUNTER — Telehealth: Payer: Self-pay | Admitting: *Deleted

## 2013-04-30 NOTE — Telephone Encounter (Addendum)
i informed pt results would be reviewed by Dr Paulla Dolly and I would call with instructions.  Pt states understanding.  Dr Paulla Dolly states there were no abnormal findings on MRI, continue the boot and reappoint in 3 weeks.  Orders to pt, pt states will make an appt later this week.

## 2013-06-24 ENCOUNTER — Other Ambulatory Visit: Payer: Self-pay | Admitting: Family Medicine

## 2013-08-16 ENCOUNTER — Other Ambulatory Visit: Payer: Self-pay | Admitting: Family Medicine

## 2013-08-19 NOTE — Telephone Encounter (Signed)
Needs office visit.

## 2013-09-10 ENCOUNTER — Other Ambulatory Visit: Payer: Self-pay | Admitting: Orthopedic Surgery

## 2013-09-10 DIAGNOSIS — M25572 Pain in left ankle and joints of left foot: Secondary | ICD-10-CM

## 2013-09-12 ENCOUNTER — Ambulatory Visit
Admission: RE | Admit: 2013-09-12 | Discharge: 2013-09-12 | Disposition: A | Discharge status: Home / Self Care | Payer: 59 | Source: Ambulatory Visit | Attending: Orthopedic Surgery | Admitting: Orthopedic Surgery

## 2013-09-12 DIAGNOSIS — M25572 Pain in left ankle and joints of left foot: Secondary | ICD-10-CM

## 2013-10-06 ENCOUNTER — Other Ambulatory Visit (HOSPITAL_COMMUNITY): Payer: Self-pay | Admitting: Orthopedic Surgery

## 2013-10-11 ENCOUNTER — Other Ambulatory Visit: Payer: Self-pay | Admitting: Family Medicine

## 2013-10-13 ENCOUNTER — Encounter (HOSPITAL_COMMUNITY): Payer: Self-pay | Admitting: Pharmacy Technician

## 2013-10-14 NOTE — Pre-Procedure Instructions (Addendum)
[name]  10/14/2013   Your procedure is scheduled on:  10/24/13  Report to [facility] short stay admitting at 1015 AM.  Call this number if you have problems the morning of surgery: [phone]   Remember:   Do not eat food or drink liquids after midnight.   Take these medicines the morning of surgery with A SIP OF WATER: none  STOP all herbel meds, nsaids (aleve,naproxen,advil,ibuprofen) 5 days prior to surgery(10/19/13) including vitamins, aspirin   Do not wear jewelry, make-up or nail polish.  Do not wear lotions, powders, or perfumes. You may wear deodorant.  Do not shave 48 hours prior to surgery. Men may shave face and neck.  Do not bring valuables to the hospital.  [facility] is not responsible                  for any belongings or valuables.               Contacts, dentures or bridgework may not be worn into surgery.  Leave suitcase in the car. After surgery it may be brought to your room.  For patients admitted to the hospital, discharge time is determined by your                treatment team.               Patients discharged the day of surgery will not be allowed to drive  home.  Name and phone number of your driver:   Special Instructions:  Special Instructions: [place] - Preparing for Surgery  Before surgery, you can play an important role.  Because skin is not sterile, your skin needs to be as free of germs as possible.  You can reduce the number of germs on you skin by washing with CHG (chlorahexidine gluconate) soap before surgery.  CHG is an antiseptic cleaner which kills germs and bonds with the skin to continue killing germs even after washing.  Please DO NOT use if you have an allergy to CHG or antibacterial soaps.  If your skin becomes reddened/irritated stop using the CHG and inform your nurse when you arrive at Short Stay.  Do not shave (including legs and underarms) for at least 48 hours prior to the first CHG shower.  You may shave your  face.  Please follow these instructions carefully:   1.  Shower with CHG Soap the night before surgery and the morning of Surgery.  2.  If you choose to wash your hair, wash your hair first as usual with your normal shampoo.  3.  After you shampoo, rinse your hair and body thoroughly to remove the Shampoo.  4.  Use CHG as you would any other liquid soap.  You can apply chg directly  to the skin and wash gently with scrungie or a clean washcloth.  5.  Apply the CHG Soap to your body ONLY FROM THE NECK DOWN.  Do not use on open wounds or open sores.  Avoid contact with your eyes ears, mouth and genitals (private parts).  Wash genitals (private parts)       with your normal soap.  6.  Wash thoroughly, paying special attention to the area where your surgery will be performed.  7.  Thoroughly rinse your body with warm water from the neck down.  8.  DO NOT shower/wash with your normal soap after using and rinsing off the CHG Soap.  9.  Pat yourself dry with a  clean towel.            10.  Wear clean pajamas.            11.  Place clean sheets on your bed the night of your first shower and do not sleep with pets.  Day of Surgery  Do not apply any lotions/deodorants the morning of surgery.  Please wear clean clothes to the hospital/surgery center.   Please read over the following fact sheets that you were given: Pain Booklet, Coughing and Deep Breathing, MRSA Information and Surgical Site Infection Prevention

## 2013-10-15 ENCOUNTER — Encounter (HOSPITAL_COMMUNITY): Payer: Self-pay

## 2013-10-15 ENCOUNTER — Encounter (HOSPITAL_COMMUNITY)
Admission: RE | Admit: 2013-10-15 | Discharge: 2013-10-15 | Disposition: A | Discharge status: Home / Self Care | Payer: 59 | Source: Ambulatory Visit | Attending: Anesthesiology | Admitting: Anesthesiology

## 2013-10-15 ENCOUNTER — Encounter (HOSPITAL_COMMUNITY)
Admission: RE | Admit: 2013-10-15 | Discharge: 2013-10-15 | Disposition: A | Discharge status: Home / Self Care | Payer: 59 | Source: Ambulatory Visit | Attending: Orthopedic Surgery | Admitting: Orthopedic Surgery

## 2013-10-15 DIAGNOSIS — Z01810 Encounter for preprocedural cardiovascular examination: Secondary | ICD-10-CM | POA: Insufficient documentation

## 2013-10-15 DIAGNOSIS — Z01818 Encounter for other preprocedural examination: Secondary | ICD-10-CM | POA: Diagnosis not present

## 2013-10-15 DIAGNOSIS — Z01812 Encounter for preprocedural laboratory examination: Secondary | ICD-10-CM | POA: Diagnosis not present

## 2013-10-15 HISTORY — DX: Unspecified osteoarthritis, unspecified site: M19.90

## 2013-10-15 HISTORY — DX: Type 2 diabetes mellitus without complications: E11.9

## 2013-10-15 HISTORY — DX: Other specified postprocedural states: R11.2

## 2013-10-15 HISTORY — DX: Other specified postprocedural states: Z98.890

## 2013-10-15 LAB — COMPREHENSIVE METABOLIC PANEL
ALBUMIN: 4.2 g/dL (ref 3.5–5.2)
ALT: 18 U/L (ref 0–35)
AST: 21 U/L (ref 0–37)
Alkaline Phosphatase: 75 U/L (ref 39–117)
Anion gap: 12 (ref 5–15)
BUN: 22 mg/dL (ref 6–23)
CALCIUM: 9.4 mg/dL (ref 8.4–10.5)
CHLORIDE: 99 meq/L (ref 96–112)
CO2: 28 mEq/L (ref 19–32)
Creatinine, Ser: 0.85 mg/dL (ref 0.50–1.10)
GFR calc Af Amer: 86 mL/min — ABNORMAL LOW (ref >90)
GFR calc non Af Amer: 75 mL/min — ABNORMAL LOW (ref >90)
Glucose, Bld: 167 mg/dL — ABNORMAL HIGH (ref 70–99)
Potassium: 4.3 mEq/L (ref 3.7–5.3)
Sodium: 139 mEq/L (ref 137–147)
Total Bilirubin: 0.4 mg/dL (ref 0.3–1.2)
Total Protein: 7.7 g/dL (ref 6.0–8.3)

## 2013-10-15 LAB — CBC
HCT: 40.7 % (ref 36.0–46.0)
Hemoglobin: 13.5 g/dL (ref 12.0–15.0)
MCH: 30.1 pg (ref 26.0–34.0)
MCHC: 33.2 g/dL (ref 30.0–36.0)
MCV: 90.8 fL (ref 78.0–100.0)
PLATELETS: 221 10*3/uL (ref 150–400)
RBC: 4.48 MIL/uL (ref 3.87–5.11)
RDW: 13 % (ref 11.5–15.5)
WBC: 6 10*3/uL (ref 4.0–10.5)

## 2013-10-15 LAB — PROTIME-INR
INR: 0.95 (ref 0.00–1.49)
PROTHROMBIN TIME: 12.7 s (ref 11.6–15.2)

## 2013-10-15 LAB — APTT: aPTT: 29 seconds (ref 24–37)

## 2013-10-15 NOTE — Progress Notes (Signed)
10/15/13 0916  OBSTRUCTIVE SLEEP APNEA  Have you ever been diagnosed with sleep apnea through a sleep study? No  Do you snore loudly (loud enough to be heard through closed doors)?  0  Do you often feel tired, fatigued, or sleepy during the daytime? 0  Has anyone observed you stop breathing during your sleep? 0  Do you have, or are you being treated for high blood pressure? 1  BMI more than 35 kg/m2? 1  Age over 56 years old? 1  Neck circumference greater than 40 cm/16 inches? 1 (17)  Gender: 0  Obstructive Sleep Apnea Score 4  Score 4 or greater  Results sent to PCP

## 2013-10-15 NOTE — Progress Notes (Signed)
req'd office notes, ekg from pcp dr Gwyndolyn Saxon [place]

## 2013-10-16 NOTE — Progress Notes (Signed)
Anesthesia chart review: Patient is a 58 year old female scheduled for left subtalar and talonavicular fusion on 10/24/13 by Dr. Sharol Given.  History includes former smoker, postoperative nausea and vomiting, hypertension, exertional dyspnea, asthma, borderline diabetes mellitus type 2, cervical scans her status post hysterectomy, stress incontinence, hot flashes, hypercholesterolemia, rectocele, back surgery, right TKA '13. BMI is consistent with morbid obesity.  OSA screening score is 4. PCP is Dr. Baltazar Apo.  EKG on 10/15/13 showed NSR, incomplete right BBB.  It was not felt significantly changed from prior tracing from 01/16/08 [facility]).   Preoperative CXR and labs noted.  Non-fasting glucose was 167.  Further evaluation by her assigned anesthesiologist on the day of surgery. If no acute changes then I would anticipate that she could proceed as planned.  George [facility] Short Stay Center/Anesthesiology Phone [phone] 10/16/2013 12:13 PM

## 2013-10-23 ENCOUNTER — Ambulatory Visit (HOSPITAL_COMMUNITY)
Admission: RE | Admit: 2013-10-23 | Discharge: 2013-10-23 | Disposition: A | Discharge status: Home / Self Care | Payer: 59 | Source: Ambulatory Visit | Attending: Family Medicine | Admitting: Family Medicine

## 2013-10-23 ENCOUNTER — Other Ambulatory Visit: Payer: Self-pay | Admitting: Family Medicine

## 2013-10-23 DIAGNOSIS — Z1231 Encounter for screening mammogram for malignant neoplasm of breast: Secondary | ICD-10-CM

## 2013-10-23 MED ORDER — CEFAZOLIN SODIUM-DEXTROSE 2-3 GM-% IV SOLR
2.0000 g | INTRAVENOUS | Status: AC
  Administered 2013-10-24: 2 g via INTRAVENOUS
  Filled 2013-10-23: qty 50

## 2013-10-24 ENCOUNTER — Encounter (HOSPITAL_COMMUNITY): Payer: Self-pay | Admitting: Surgery

## 2013-10-24 ENCOUNTER — Observation Stay (HOSPITAL_COMMUNITY)
Admission: RE | Admit: 2013-10-24 | Discharge: 2013-10-25 | Disposition: A | Discharge status: Home / Self Care | Payer: 59 | Source: Ambulatory Visit | Attending: Orthopedic Surgery | Admitting: Orthopedic Surgery

## 2013-10-24 ENCOUNTER — Encounter (HOSPITAL_COMMUNITY)
Admission: RE | Disposition: A | Discharge status: Home / Self Care | Payer: Self-pay | Source: Ambulatory Visit | Attending: Orthopedic Surgery

## 2013-10-24 ENCOUNTER — Inpatient Hospital Stay (HOSPITAL_COMMUNITY): Payer: 59 | Admitting: Anesthesiology

## 2013-10-24 ENCOUNTER — Encounter (HOSPITAL_COMMUNITY): Payer: 59 | Admitting: Vascular Surgery

## 2013-10-24 DIAGNOSIS — M19079 Primary osteoarthritis, unspecified ankle and foot: Secondary | ICD-10-CM | POA: Insufficient documentation

## 2013-10-24 DIAGNOSIS — I10 Essential (primary) hypertension: Secondary | ICD-10-CM | POA: Diagnosis not present

## 2013-10-24 DIAGNOSIS — E7800 Pure hypercholesterolemia, unspecified: Secondary | ICD-10-CM | POA: Diagnosis not present

## 2013-10-24 DIAGNOSIS — E119 Type 2 diabetes mellitus without complications: Secondary | ICD-10-CM | POA: Insufficient documentation

## 2013-10-24 DIAGNOSIS — J45909 Unspecified asthma, uncomplicated: Secondary | ICD-10-CM | POA: Diagnosis not present

## 2013-10-24 DIAGNOSIS — Z87891 Personal history of nicotine dependence: Secondary | ICD-10-CM | POA: Insufficient documentation

## 2013-10-24 DIAGNOSIS — M76829 Posterior tibial tendinitis, unspecified leg: Secondary | ICD-10-CM | POA: Diagnosis present

## 2013-10-24 DIAGNOSIS — M24173 Other articular cartilage disorders, unspecified ankle: Secondary | ICD-10-CM | POA: Diagnosis present

## 2013-10-24 DIAGNOSIS — M2140 Flat foot [pes planus] (acquired), unspecified foot: Secondary | ICD-10-CM | POA: Diagnosis not present

## 2013-10-24 DIAGNOSIS — M249 Joint derangement, unspecified: Secondary | ICD-10-CM | POA: Diagnosis present

## 2013-10-24 HISTORY — PX: ANKLE FUSION: SHX5718

## 2013-10-24 LAB — GLUCOSE, CAPILLARY
Glucose-Capillary: 138 mg/dL — ABNORMAL HIGH (ref 70–99)
Glucose-Capillary: 130 mg/dL — ABNORMAL HIGH (ref 70–99)

## 2013-10-24 SURGERY — ANKLE FUSION
Anesthesia: General | Site: Ankle | Laterality: Left

## 2013-10-24 MED ORDER — SENNOSIDES-DOCUSATE SODIUM 8.6-50 MG PO TABS: 1.0000 | ORAL_TABLET | Freq: Every evening | ORAL | Status: DC | PRN

## 2013-10-24 MED ORDER — ONDANSETRON HCL 4 MG/2ML IJ SOLN
4.0000 mg | Freq: Four times a day (QID) | INTRAMUSCULAR | Status: DC | PRN
  Administered 2013-10-24: 4 mg via INTRAVENOUS
  Filled 2013-10-24: qty 2

## 2013-10-24 MED ORDER — FENTANYL CITRATE 0.05 MG/ML IJ SOLN: 25.0000 ug | INTRAMUSCULAR | Status: DC | PRN

## 2013-10-24 MED ORDER — ASPIRIN EC 325 MG PO TBEC
325.0000 mg | DELAYED_RELEASE_TABLET | Freq: Every day | ORAL | Status: DC
  Administered 2013-10-24 – 2013-10-25 (×2): 325 mg via ORAL
  Filled 2013-10-24 (×2): qty 1

## 2013-10-24 MED ORDER — MIDAZOLAM HCL 5 MG/ML IJ SOLN: 2.0000 mg | Freq: Once | INTRAMUSCULAR | Status: DC

## 2013-10-24 MED ORDER — METOCLOPRAMIDE HCL 5 MG PO TABS
5.0000 mg | ORAL_TABLET | Freq: Three times a day (TID) | ORAL | Status: DC | PRN
  Filled 2013-10-24: qty 2

## 2013-10-24 MED ORDER — CEFAZOLIN SODIUM-DEXTROSE 2-3 GM-% IV SOLR
2.0000 g | Freq: Four times a day (QID) | INTRAVENOUS | Status: AC
  Administered 2013-10-24 – 2013-10-25 (×3): 2 g via INTRAVENOUS
  Filled 2013-10-24 (×3): qty 50

## 2013-10-24 MED ORDER — MIDAZOLAM HCL 2 MG/2ML IJ SOLN
INTRAMUSCULAR | Status: AC
  Administered 2013-10-24: 2 mg
  Filled 2013-10-24: qty 2

## 2013-10-24 MED ORDER — NEOSTIGMINE METHYLSULFATE 10 MG/10ML IV SOLN
INTRAVENOUS | Status: DC | PRN
  Administered 2013-10-24: 3 mg via INTRAVENOUS

## 2013-10-24 MED ORDER — METHOCARBAMOL 1000 MG/10ML IJ SOLN
500.0000 mg | Freq: Four times a day (QID) | INTRAVENOUS | Status: DC | PRN
  Filled 2013-10-24: qty 5

## 2013-10-24 MED ORDER — EPHEDRINE SULFATE 50 MG/ML IJ SOLN
INTRAMUSCULAR | Status: AC
  Filled 2013-10-24: qty 1

## 2013-10-24 MED ORDER — PROPOFOL 10 MG/ML IV BOLUS
INTRAVENOUS | Status: AC
  Filled 2013-10-24: qty 20

## 2013-10-24 MED ORDER — ONDANSETRON HCL 4 MG/2ML IJ SOLN
INTRAMUSCULAR | Status: AC
  Filled 2013-10-24: qty 2

## 2013-10-24 MED ORDER — ONDANSETRON HCL 4 MG/2ML IJ SOLN
INTRAMUSCULAR | Status: DC | PRN
  Administered 2013-10-24: 4 mg via INTRAVENOUS

## 2013-10-24 MED ORDER — DOCUSATE SODIUM 100 MG PO CAPS
100.0000 mg | ORAL_CAPSULE | Freq: Two times a day (BID) | ORAL | Status: DC
  Administered 2013-10-24 – 2013-10-25 (×2): 100 mg via ORAL
  Filled 2013-10-24 (×2): qty 1

## 2013-10-24 MED ORDER — MAGNESIUM CITRATE PO SOLN: 1.0000 | Freq: Once | ORAL | Status: AC | PRN

## 2013-10-24 MED ORDER — LIDOCAINE HCL (CARDIAC) 20 MG/ML IV SOLN
INTRAVENOUS | Status: AC
  Filled 2013-10-24: qty 5

## 2013-10-24 MED ORDER — ROCURONIUM BROMIDE 100 MG/10ML IV SOLN
INTRAVENOUS | Status: DC | PRN
Start: 2013-10-24 — End: 2013-10-24
  Administered 2013-10-24: 40 mg via INTRAVENOUS

## 2013-10-24 MED ORDER — ONDANSETRON HCL 4 MG PO TABS: 4.0000 mg | ORAL_TABLET | Freq: Four times a day (QID) | ORAL | Status: DC | PRN

## 2013-10-24 MED ORDER — METHOCARBAMOL 500 MG PO TABS: 500.0000 mg | ORAL_TABLET | Freq: Four times a day (QID) | ORAL | Status: DC | PRN

## 2013-10-24 MED ORDER — LISINOPRIL 10 MG PO TABS
10.0000 mg | ORAL_TABLET | Freq: Every day | ORAL | Status: DC
  Administered 2013-10-25: 10 mg via ORAL
  Filled 2013-10-24: qty 1

## 2013-10-24 MED ORDER — ONDANSETRON HCL 4 MG/2ML IJ SOLN: 4.0000 mg | Freq: Once | INTRAMUSCULAR | Status: DC | PRN

## 2013-10-24 MED ORDER — SIMVASTATIN 40 MG PO TABS
40.0000 mg | ORAL_TABLET | Freq: Every day | ORAL | Status: DC
  Administered 2013-10-24: 40 mg via ORAL
  Filled 2013-10-24 (×2): qty 1

## 2013-10-24 MED ORDER — FENTANYL CITRATE 0.05 MG/ML IJ SOLN
INTRAMUSCULAR | Status: AC
  Administered 2013-10-24: 100 ug via INTRAVENOUS
  Filled 2013-10-24: qty 2

## 2013-10-24 MED ORDER — HYDROMORPHONE HCL PF 1 MG/ML IJ SOLN: 0.5000 mg | INTRAMUSCULAR | Status: DC | PRN

## 2013-10-24 MED ORDER — OXYCODONE HCL 5 MG/5ML PO SOLN: 5.0000 mg | Freq: Once | ORAL | Status: DC | PRN

## 2013-10-24 MED ORDER — FENTANYL CITRATE 0.05 MG/ML IJ SOLN
100.0000 ug | Freq: Once | INTRAMUSCULAR | Status: AC
Start: 2013-10-24 — End: 2013-10-24
  Administered 2013-10-24: 100 ug via INTRAVENOUS

## 2013-10-24 MED ORDER — HYDROCHLOROTHIAZIDE 12.5 MG PO CAPS
12.5000 mg | ORAL_CAPSULE | Freq: Every day | ORAL | Status: DC
  Administered 2013-10-25: 12.5 mg via ORAL
  Filled 2013-10-24: qty 1

## 2013-10-24 MED ORDER — FENTANYL CITRATE 0.05 MG/ML IJ SOLN
INTRAMUSCULAR | Status: DC | PRN
  Administered 2013-10-24: 125 ug via INTRAVENOUS

## 2013-10-24 MED ORDER — SODIUM CHLORIDE 0.9 % IV SOLN: INTRAVENOUS | Status: DC

## 2013-10-24 MED ORDER — METOCLOPRAMIDE HCL 5 MG/ML IJ SOLN: 5.0000 mg | Freq: Three times a day (TID) | INTRAMUSCULAR | Status: DC | PRN

## 2013-10-24 MED ORDER — LACTATED RINGERS IV SOLN
INTRAVENOUS | Status: DC
  Administered 2013-10-24 (×2): via INTRAVENOUS

## 2013-10-24 MED ORDER — LIDOCAINE HCL (CARDIAC) 20 MG/ML IV SOLN
INTRAVENOUS | Status: DC | PRN
  Administered 2013-10-24: 40 mg via INTRAVENOUS

## 2013-10-24 MED ORDER — PHENYLEPHRINE 40 MCG/ML (10ML) SYRINGE FOR IV PUSH (FOR BLOOD PRESSURE SUPPORT)
PREFILLED_SYRINGE | INTRAVENOUS | Status: AC
  Filled 2013-10-24: qty 20

## 2013-10-24 MED ORDER — SODIUM CHLORIDE 0.9 % IJ SOLN
INTRAMUSCULAR | Status: AC
  Filled 2013-10-24: qty 10

## 2013-10-24 MED ORDER — ROCURONIUM BROMIDE 50 MG/5ML IV SOLN
INTRAVENOUS | Status: AC
  Filled 2013-10-24: qty 1

## 2013-10-24 MED ORDER — OXYCODONE-ACETAMINOPHEN 5-325 MG PO TABS
1.0000 | ORAL_TABLET | ORAL | Status: DC | PRN
  Administered 2013-10-24 – 2013-10-25 (×3): 1 via ORAL
  Filled 2013-10-24 (×3): qty 1

## 2013-10-24 MED ORDER — FENTANYL CITRATE 0.05 MG/ML IJ SOLN
INTRAMUSCULAR | Status: AC
  Filled 2013-10-24: qty 5

## 2013-10-24 MED ORDER — PROPOFOL 10 MG/ML IV BOLUS
INTRAVENOUS | Status: DC | PRN
  Administered 2013-10-24: 200 mg via INTRAVENOUS

## 2013-10-24 MED ORDER — MIDAZOLAM HCL 2 MG/2ML IJ SOLN
INTRAMUSCULAR | Status: AC
  Filled 2013-10-24: qty 2

## 2013-10-24 MED ORDER — LISINOPRIL-HYDROCHLOROTHIAZIDE 10-12.5 MG PO TABS: 1.0000 | ORAL_TABLET | Freq: Every morning | ORAL | Status: DC

## 2013-10-24 MED ORDER — PHENYLEPHRINE HCL 10 MG/ML IJ SOLN
INTRAMUSCULAR | Status: DC | PRN
  Administered 2013-10-24: 80 ug via INTRAVENOUS
  Administered 2013-10-24 (×2): 120 ug via INTRAVENOUS
  Administered 2013-10-24 (×5): 80 ug via INTRAVENOUS

## 2013-10-24 MED ORDER — OXYCODONE HCL 5 MG PO TABS: 5.0000 mg | ORAL_TABLET | Freq: Once | ORAL | Status: DC | PRN

## 2013-10-24 MED ORDER — GLYCOPYRROLATE 0.2 MG/ML IJ SOLN
INTRAMUSCULAR | Status: DC | PRN
  Administered 2013-10-24: 0.4 mg via INTRAVENOUS

## 2013-10-24 MED ORDER — BISACODYL 5 MG PO TBEC: 5.0000 mg | DELAYED_RELEASE_TABLET | Freq: Every day | ORAL | Status: DC | PRN

## 2013-10-24 SURGICAL SUPPLY — 42 items
BANDAGE ESMARK 6X9 LF (GAUZE/BANDAGES/DRESSINGS) ×1 IMPLANT
BANDAGE GAUZE ELAST BULKY 4 IN (GAUZE/BANDAGES/DRESSINGS) ×4 IMPLANT
BIT DRILL CANN LRG QC 5X300 (BIT) ×1 IMPLANT
BLADE SAW SGTL HD 18.5X60.5X1. (BLADE) ×2 IMPLANT
BLADE SURG 10 STRL SS (BLADE) IMPLANT
BNDG CMPR 9X6 STRL LF SNTH (GAUZE/BANDAGES/DRESSINGS) ×1
BNDG COHESIVE 4X5 TAN STRL (GAUZE/BANDAGES/DRESSINGS) ×2 IMPLANT
BNDG ESMARK 6X9 LF (GAUZE/BANDAGES/DRESSINGS) ×2
COVER MAYO STAND STRL (DRAPES) IMPLANT
COVER SURGICAL LIGHT HANDLE (MISCELLANEOUS) ×2 IMPLANT
DRAPE INCISE IOBAN 66X45 STRL (DRAPES) ×2 IMPLANT
DRAPE OEC MINIVIEW 54X84 (DRAPES) IMPLANT
DRAPE U-SHAPE 47X51 STRL (DRAPES) ×2 IMPLANT
DRSG ADAPTIC 3X8 NADH LF (GAUZE/BANDAGES/DRESSINGS) ×2 IMPLANT
DURAPREP 26ML APPLICATOR (WOUND CARE) ×2 IMPLANT
ELECT REM PT RETURN 9FT ADLT (ELECTROSURGICAL) ×2
ELECTRODE REM PT RTRN 9FT ADLT (ELECTROSURGICAL) ×1 IMPLANT
GAUZE INTERSORB DRY 3X8 047086 (GAUZE/BANDAGES/DRESSINGS) ×1 IMPLANT
GLOVE BIOGEL PI IND STRL 9 (GLOVE) ×1 IMPLANT
GLOVE BIOGEL PI INDICATOR 9 (GLOVE) ×1
GLOVE SURG ORTHO 9.0 STRL STRW (GLOVE) ×2 IMPLANT
GOWN STRL REUS W/ TWL XL LVL3 (GOWN DISPOSABLE) ×3 IMPLANT
GOWN STRL REUS W/TWL XL LVL3 (GOWN DISPOSABLE) ×6
GUIDEWIRE 2.8MM (WIRE) ×3 IMPLANT
KIT BASIN OR (CUSTOM PROCEDURE TRAY) ×2 IMPLANT
KIT ROOM TURNOVER OR (KITS) ×2 IMPLANT
NS IRRIG 1000ML POUR BTL (IV SOLUTION) ×2 IMPLANT
PACK ORTHO EXTREMITY (CUSTOM PROCEDURE TRAY) ×2 IMPLANT
PAD ABD 8X10 STRL (GAUZE/BANDAGES/DRESSINGS) ×2 IMPLANT
PAD ARMBOARD 7.5X6 YLW CONV (MISCELLANEOUS) ×4 IMPLANT
PUTTY BONE DBX 5CC MIX (Putty) ×1 IMPLANT
SCREW HL THREAD 6.5X65 (Screw) ×2 IMPLANT
SCREW SHORT THREAD 4.0X40 (Screw) ×2 IMPLANT
SPONGE GAUZE 4X4 12PLY (GAUZE/BANDAGES/DRESSINGS) ×2 IMPLANT
SPONGE GAUZE 4X4 12PLY STER LF (GAUZE/BANDAGES/DRESSINGS) ×1 IMPLANT
SPONGE LAP 18X18 X RAY DECT (DISPOSABLE) ×2 IMPLANT
SUCTION FRAZIER TIP 10 FR DISP (SUCTIONS) ×2 IMPLANT
SUT ETHILON 2 0 PSLX (SUTURE) ×6 IMPLANT
TOWEL OR 17X24 6PK STRL BLUE (TOWEL DISPOSABLE) ×2 IMPLANT
TOWEL OR 17X26 10 PK STRL BLUE (TOWEL DISPOSABLE) ×2 IMPLANT
TUBE CONNECTING 12X1/4 (SUCTIONS) ×2 IMPLANT
WATER STERILE IRR 1000ML POUR (IV SOLUTION) ×2 IMPLANT

## 2013-10-24 NOTE — Progress Notes (Signed)
Orthopedic Tech Progress Note Patient Details:  [name] Jun 14, 1955 [id number]  Ortho Devices Type of Ortho Device: CAM walker Ortho Device/Splint Interventions: Application   Cammer, [name] 10/24/2013, 2:48 PM

## 2013-10-24 NOTE — H&P (Signed)
[name] is an 58 y.o. female.   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  Past Medical History  Diagnosis Date  . Cancer     cervical  . Hypertension   . Stress incontinence   . Elevated cholesterol   . Superficial fungus infection of skin   . Panniculus   . Hot flashes 07/05/2012  . Rectocele 07/05/2012  . IFG (impaired fasting glucose)   . PONV (postoperative nausea and vomiting)     previously not recently  . Shortness of breath     exersional  . Asthma     Mild no rx  . Diabetes mellitus without complication     "borderline"  . Arthritis     Past Surgical History  Procedure Laterality Date  . Abdominal hysterectomy    . Knee arthroscopy Bilateral 208 lft, 2010 rt  . Lap bso loa    . Total knee arthroplasty Right 2013  . Back surgery  04  . Hammer toes Bilateral     Family History  Problem Relation Age of Onset  . Heart attack Father   . Cancer Other     cervical  . Diabetes Other   . Hypertension Other   . Stroke Mother   . Diabetes Brother   . Hypertension Brother   . Diabetes Brother   . Hypertension Brother    Social History:  reports that she quit smoking about 25 years ago. Her smoking use included Cigarettes. She has a 5 pack-year smoking history. She has never used smokeless tobacco. She reports that she does not drink alcohol or use illicit drugs.  Allergies:  Allergies  Allergen Reactions  . Naprosyn [Naproxen]     No prescriptions prior to admission    No results found for this or any previous visit (from the past 48 hour(s)). No results found.  Review of Systems  All other systems reviewed and are negative.   There were no vitals taken for this  visit. Physical Exam   Examination patient has a good dorsalis pedis pulse she has a fixed pronated valgus hindfoot. She has tenderness to palpation the sinus Tarsi. Radiographs shows arthritic changes of the subtalar and talonavicular joint. 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  Plan: Will plan for a subtalar and talonavicular fusion. Risks and benefits were discussed including infection persistent pain need for additional surgery. Patient states she understands and wishes to proceed at this time.  DUDA,MARCUS V 10/24/2013, 7:02 AM

## 2013-10-24 NOTE — Anesthesia Procedure Notes (Addendum)
Procedure Name: Intubation Date/Time: 10/24/2013 12:27 PM Performed by: Rush Farmer E Pre-anesthesia Checklist: Patient identified, Emergency Drugs available, Suction available, Patient being monitored and Timeout performed Patient Re-evaluated:Patient Re-evaluated prior to inductionOxygen Delivery Method: Circle system utilized Preoxygenation: Pre-oxygenation with 100% oxygen Intubation Type: IV induction Laryngoscope Size: Mac and 3 Grade View: Grade I Tube type: Oral Tube size: 7.0 mm Number of attempts: 1 Airway Equipment and Method: Stylet Placement Confirmation: ETT inserted through vocal cords under direct vision,  positive ETCO2 and breath sounds checked- equal and bilateral Secured at: 21 cm Tube secured with: Tape Dental Injury: Teeth and Oropharynx as per pre-operative assessment    Anesthesia Regional Block:  Popliteal block  Pre-Anesthetic Checklist: ,, timeout performed, Correct Patient, Correct Site, Correct Laterality, Correct Procedure, Correct Position, site marked, Risks and benefits discussed,  Surgical consent,  Pre-op evaluation,  At surgeon's request and post-op pain management  Laterality: Left  Prep: chloraprep       Needles:   Needle Type: Echogenic Stimulator Needle     Needle Length: 9cm 9 cm Needle Gauge: 22 and 22 G    Additional Needles:  Procedures: ultrasound guided (picture in chart) and nerve stimulator Popliteal block Narrative:  Start time: 10/24/2013 11:40 AM End time: 10/24/2013 11:50 AM Injection made incrementally with aspirations every 5 mL.  Performed by: Personally   Additional Notes: 35 cc 0.5% Marcaine with 1:200 Epi injected easily

## 2013-10-24 NOTE — Anesthesia Postprocedure Evaluation (Signed)
  Anesthesia Post-op Note  Patient: [name]  Procedure(s) Performed: Procedure(s): LEFT ANKLE SUBTALAR AND TALONAVICULAR FUSION (Left)  Patient Location: PACU  Anesthesia Type:General and GA combined with regional for post-op pain  Level of Consciousness: awake, alert  and oriented  Airway and Oxygen Therapy: Patient Spontanous Breathing and Patient connected to nasal cannula oxygen  Post-op Pain: none  Post-op Assessment: Post-op Vital signs reviewed, Patient's Cardiovascular Status Stable, Respiratory Function Stable, Patent Airway and Pain level controlled  Post-op Vital Signs: stable  Last Vitals:  Filed Vitals:   10/24/13 1500  BP: 125/67  Pulse: 83  Temp: 36.3 C  Resp: 18    Complications: No apparent anesthesia complications

## 2013-10-24 NOTE — Anesthesia Preprocedure Evaluation (Addendum)
Anesthesia Evaluation  Patient identified by MRN, date of birth, ID band Patient awake    Reviewed: Allergy & Precautions, H&P , NPO status , Patient's Chart, lab work & pertinent test results  Airway Mallampati: II TM Distance: >3 FB Neck ROM: Full    Dental  (+) Teeth Intact, Dental Advisory Given   Pulmonary former smoker,  breath sounds clear to auscultation        Cardiovascular hypertension, Rhythm:Regular Rate:Normal     Neuro/Psych    GI/Hepatic   Endo/Other  diabetes  Renal/GU      Musculoskeletal   Abdominal   Peds  Hematology   Anesthesia Other Findings   Reproductive/Obstetrics                          Anesthesia Physical Anesthesia Plan  ASA: III  Anesthesia Plan: General   Post-op Pain Management:    Induction:   Airway Management Planned: LMA  Additional Equipment:   Intra-op Plan:   Post-operative Plan:   Informed Consent: I have reviewed the patients History and Physical, chart, labs and discussed the procedure including the risks, benefits and alternatives for the proposed anesthesia with the patient or authorized representative who has indicated his/her understanding and acceptance.   Dental advisory given  Plan Discussed with: CRNA and Anesthesiologist  Anesthesia Plan Comments:         Anesthesia Quick Evaluation

## 2013-10-24 NOTE — Transfer of Care (Signed)
Immediate Anesthesia Transfer of Care Note  Patient: [name]  Procedure(s) Performed: Procedure(s): LEFT ANKLE SUBTALAR AND TALONAVICULAR FUSION (Left)  Patient Location: PACU  Anesthesia Type:General  Level of Consciousness: awake, alert  and oriented  Airway & Oxygen Therapy: Patient Spontanous Breathing and Patient connected to nasal cannula oxygen  Post-op Assessment: Report given to PACU RN and Post -op Vital signs reviewed and stable  Post vital signs: Reviewed and stable  Complications: No apparent anesthesia complications

## 2013-10-24 NOTE — Op Note (Signed)
10/24/2013  5:15 PM  PATIENT:  [name]    PRE-OPERATIVE DIAGNOSIS:  Left ankle posterior tibial tendon insufficiency  POST-OPERATIVE DIAGNOSIS:  Same  PROCEDURE:  LEFT ANKLE SUBTALAR AND TALONAVICULAR FUSION  SURGEON:  Newt Minion, MD  PHYSICIAN ASSISTANT:None ANESTHESIA:   General  PREOPERATIVE INDICATIONS:  [name] is a  58 y.o. female with a diagnosis of Left ankle posterior tibial tendon insufficiency who failed conservative measures and elected for surgical management.    The risks benefits and alternatives were discussed with the patient preoperatively including but not limited to the risks of infection, bleeding, nerve injury, cardiopulmonary complications, the need for revision surgery, among others, and the patient was willing to proceed.  OPERATIVE IMPLANTS: Cannulated screw fixation x2 for the subtalar and talonavicular joints. Demineralized bone matrix 5 cc  OPERATIVE FINDINGS: Stable fusion radiographs obtained.  OPERATIVE PROCEDURE: Patient was brought to the operating room and underwent a general anesthetic. After adequate levels of anesthesia were obtained patient's left lower extremity was prepped using DuraPrep draped into a sterile field. A timeout was called. An oblique incision was made across the sinus Tarsi. The extensor muscles were elevated and using a saw and osteotome the subtalar joint was debrided of articular cartilage. A curette was used to finalize the debridement. The wound was irrigated with normal saline and the articular surfaces were packed with the  demineralized bone matrix. Attention was then focused on the talonavicular joint. A dorsal incision was made longitudinally just medial to the anterior tibial tendon. This was carried down to the talonavicular joint which was also debrided of articular cartilage. With the foot held plantigrade with slight valgus with a flat plate beneath the foot 2 K wires were inserted from the calcaneus into the  talus. C-arm fluoroscopy verified alignment. These were overdrilled and cannulated compression screws were placed to secure the subtalar joint. Attention was then focused on the talonavicular joint. With the foot plantigrade slight valgus hindfoot 2 guidewires were inserted from the navicular into the talus. 4.0 cannulated screws were then used to stabilize the talonavicular joint. C-arm fluoroscopy verified alignment in both AP and lateral planes. The bone fusion areas were packed with demineralized bone matrix. The wounds were irrigated with normal saline. The incisions were closed using 2-0 nylon. The foot was then wrapped with a sterile compressive dressing patient was extubated taken to the PACU in stable condition.

## 2013-10-25 DIAGNOSIS — M2140 Flat foot [pes planus] (acquired), unspecified foot: Secondary | ICD-10-CM | POA: Diagnosis not present

## 2013-10-25 MED ORDER — OXYCODONE-ACETAMINOPHEN 5-325 MG PO TABS: 1.0000 | ORAL_TABLET | ORAL | Status: DC | PRN

## 2013-10-25 NOTE — Discharge Summary (Signed)
Physician Discharge Summary  Patient ID: [name] MRN: [id number] DOB/AGE: 1955-10-18 58 y.o.  Admit date: 10/24/2013 Discharge date: 10/25/2013  Admission Diagnoses: Posterior tibial tendon insufficiency on the left  Discharge Diagnoses:  Active Problems:   Posterior tibialis muscle dysfunction   Discharged Condition: stable  Hospital Course: Patient's hospital course was essentially unremarkable. She underwent subtalar and talonavicular fusion. Postoperatively patient progressed well with therapy and was discharged to home in stable condition.  Consults: None  Significant Diagnostic Studies: labs: Routine labs  Treatments: surgery: See operative note  Discharge Exam: Blood pressure 142/77, pulse 82, temperature 98.8 F (37.1 C), temperature source Oral, resp. rate 17, height 5\' 3"  (1.6 m), weight 117.028 kg (258 lb), SpO2 98.00%. Incision/Wound: dressing clean dry and intact  Disposition:      Medication List    ASK your doctor about these medications       acetaminophen 500 MG tablet  Commonly known as:  TYLENOL  Take 1,000 mg by mouth every 6 (six) hours as needed.     beta carotene w/minerals tablet  Take 1 tablet by mouth daily.     ibuprofen 200 MG tablet  Commonly known as:  ADVIL,MOTRIN  Take 400 mg by mouth every 6 (six) hours as needed.     lisinopril-hydrochlorothiazide 10-12.5 MG per tablet  Commonly known as:  PRINZIDE,ZESTORETIC  TAKE 1 TABLET BY MOUTH IN THE MORNING     simvastatin 40 MG tablet  Commonly known as:  ZOCOR  TAKE 1 TABLET BY MOUTH EVERY DAY           Follow-up Information   Follow up with Child Campoy V, MD In 1 week.   Specialty:  Orthopedic Surgery   Contact information:   [place] [address] [phone]       Signed: Newt Minion 10/25/2013, 8:14 AM

## 2013-10-25 NOTE — Discharge Instructions (Signed)
Elevation left lower extremity. Minimize weightbearing left foot with fracture boot in place. May take boot off at rest.

## 2013-10-25 NOTE — Progress Notes (Signed)
Discharge instructions and prescriptions given to and reviewed with patient. Patient denies questions or concerns at this time. PT recommending wheelchair at home for patient, order placed per MD. Patient eager to be discharged home, despite wheelchair not being delivered. Unable to contact case management or equipment. Patient told to talk with doctor when setting up follow up appointment. Patient agrees and states she has no further questions at this time. IV removed. Vital signs stable. Patient discharged with personal belongings, prescriptions, and discharge packet via wheelchair accompanied by husband.

## 2013-10-25 NOTE — Progress Notes (Signed)
Physical Therapy Treatment Patient Details Name: MAELEY MATTON MRN: [id number] DOB: Jul 29, 1955 Today's Date: 10/25/2013    History of Present Illness pt presents with L Subtalar and Talonavicular Fusion.      PT Comments    At this time pt is unable to maintain NWBing and tends to keep L LE as TDWBing or PWBing during all mobility.  Pt ed on order for NWBing.  Discussed need for W/C with elevating leg rests for home as pt is having difficulties with mobility and WBing status.  Would benefit from continued PT, although pt is eager for D/C to home today.  Will continue to follow if remains on acute.    Follow Up Recommendations  Home health PT;Supervision for mobility/OOB     Equipment Recommendations  Wheelchair (measurements PT);Wheelchair cushion (measurements PT) (Elevating leg rests)    Recommendations for Other Services OT consult     Precautions / Restrictions Precautions Precautions: Fall Required Braces or Orthoses: Other Brace/Splint Other Brace/Splint: Cam Boot Restrictions Weight Bearing Restrictions: Yes LLE Weight Bearing: Non weight bearing    Mobility  Bed Mobility Overal bed mobility: Modified Independent                Transfers Overall transfer level: Needs assistance Equipment used: Rolling walker (2 wheeled) Transfers: Sit to/from Stand Sit to Stand: Supervision         General transfer comment: pt unable to maintain NWBing on L LE.  Cues for UE use and controlling descent to sitting.    Ambulation/Gait Ambulation/Gait assistance: Min assist Ambulation Distance (Feet): 30 Feet Assistive device: Rolling walker (2 wheeled) Gait Pattern/deviations: Step-to pattern     General Gait Details: pt unable to maintain NWBing on L LE despite multiple cues and stopping mid gait to attempt to have pt keep L LE off of floor.  pt varies betweent TDWBing and PWBing throughout.     Stairs Stairs: Yes Stairs assistance: Min assist (+2 for safety.   ) Stair Management: No rails;Step to pattern;Backwards;With walker Number of Stairs: 2 General stair comments: cues for stairs sequencing, use of RW on stairs, and maintaining NWBing.    Wheelchair Mobility    Modified Rankin (Stroke Patients Only)       Balance Overall balance assessment: Needs assistance Sitting-balance support: No upper extremity supported;Feet supported Sitting balance-Leahy Scale: Good     Standing balance support: Bilateral upper extremity supported Standing balance-Leahy Scale: Poor                      Cognition Arousal/Alertness: Awake/alert Behavior During Therapy: WFL for tasks assessed/performed Overall Cognitive Status: Within Functional Limits for tasks assessed                      Exercises      General Comments        Pertinent Vitals/Pain 3/10.  Premedicated.      Home Living Family/patient expects to be discharged to:: Private residence Living Arrangements: Spouse/significant other Available Help at Discharge: Family;Available 24 hours/day Type of Home: House Home Access: Stairs to enter Entrance Stairs-Rails: None Home Layout: One level Home Equipment: Environmental consultant - 2 wheels;Bedside commode      Prior Function Level of Independence: Independent          PT Goals (current goals can now be found in the care plan section) Acute Rehab PT Goals Patient Stated Goal: Home today PT Goal Formulation: With patient Time For Goal Achievement: 11/01/13 Potential  to Achieve Goals: Good    Frequency  Min 5X/week    PT Plan      Co-evaluation             End of Session Equipment Utilized During Treatment: Gait belt (Cam Boot) Activity Tolerance: Patient limited by fatigue Patient left: in chair;with call bell/phone within reach;with family/visitor present     Time: 5456-2563 PT Time Calculation (min): 29 min  Charges:  $Gait Training: 23-37 mins                    G Codes:  Functional Assessment Tool  Used: Clinical Judgement Functional Limitation: Mobility: Walking and moving around Mobility: Walking and Moving Around Current Status [phone]): At least 1 percent but less than 20 percent impaired, limited or restricted Mobility: Walking and Moving Around Goal Status [phone]): 0 percent impaired, limited or restricted   Catarina Hartshorn, [place] 10/25/2013, 12:03 PM

## 2013-10-25 NOTE — Progress Notes (Signed)
UR Completed.  [name] [id number] 10/25/2013

## 2013-10-25 NOTE — Progress Notes (Signed)
Subjective: 1 Day Post-Op Procedure(s) (LRB): LEFT ANKLE SUBTALAR AND TALONAVICULAR FUSION (Left) Patient reports pain as mild.    Objective: Vital signs in last 24 hours: Temp:  [97.3 F (36.3 Wolf)-98.8 F (37.1 Wolf)] 98.8 F (37.1 Wolf) (08/01 0524) Pulse Rate:  [62-88] 82 (08/01 0524) Resp:  [11-27] 17 (08/01 0524) BP: (125-162)/(66-81) 142/77 mmHg (08/01 0524) SpO2:  [95 %-100 %] 98 % (08/01 0524) Weight:  [117.028 kg (258 lb)] 117.028 kg (258 lb) (07/31 1025)  Intake/Output from previous day: 07/31 0701 - 08/01 0700 In: 1390 [P.O.:240; I.V.:1100; IV Piggyback:50] Out: 28 [Blood:50] Intake/Output this shift:    No results found for this basename: HGB,  in the last 72 hours No results found for this basename: WBC, RBC, HCT, PLT,  in the last 72 hours No results found for this basename: NA, K, CL, CO2, BUN, CREATININE, GLUCOSE, CALCIUM,  in the last 72 hours No results found for this basename: LABPT, INR,  in the last 72 hours  Neurologically intact  Assessment/Plan: 1 Day Post-Op Procedure(s) (LRB): LEFT ANKLE SUBTALAR AND TALONAVICULAR FUSION (Left) Plan  Discharge home.   [name] 10/25/2013, 9:07 AM

## 2013-10-26 NOTE — Care Management Note (Signed)
    Page 1 of 1   10/26/2013     8:47:29 AM CARE MANAGEMENT NOTE 10/26/2013  Patient:  [name], [name]   Account Number:  [id number]  Date Initiated:  10/26/2013  Documentation initiated by:  [facility]  Subjective/Objective Assessment:   adm: Posterior tibial tendon insufficiency on the left     Action/Plan:   Anticipated DC Date:  10/25/2013   Anticipated DC Plan:           Choice offered to / List presented to:     DME arranged  [place]      DME agency  [place].        Status of service:   Medicare Important Message given?   (If response is "NO", the following Medicare IM given date fields will be blank) Date Medicare IM given:   Medicare IM given by:   Date Additional Medicare IM given:   Additional Medicare IM given by:    Discharge Disposition:  HOME/SELF CARE  Per UR Regulation:    If discussed at Long Length of Stay Meetings, dates discussed:    Comments:  10/26/13 08:20 CM called pt to confirm address for delivery of wheelchair and cushion.  Pt left without receiving her wheelchair and cushion.  [facility] DME rep notified.  No other CM needs were communicated.  Mariane Masters, BSN, CM [phone].

## 2013-10-29 ENCOUNTER — Encounter (HOSPITAL_COMMUNITY): Payer: Self-pay | Admitting: Orthopedic Surgery

## 2013-11-17 ENCOUNTER — Other Ambulatory Visit: Payer: Self-pay | Admitting: Family Medicine

## 2013-11-17 DIAGNOSIS — E119 Type 2 diabetes mellitus without complications: Secondary | ICD-10-CM

## 2013-11-17 DIAGNOSIS — I10 Essential (primary) hypertension: Secondary | ICD-10-CM

## 2013-11-17 DIAGNOSIS — E785 Hyperlipidemia, unspecified: Secondary | ICD-10-CM

## 2013-11-18 ENCOUNTER — Telehealth: Payer: Self-pay | Admitting: *Deleted

## 2013-11-18 NOTE — Telephone Encounter (Signed)
Way late on fu visit, lip liv m7 A1c plus ov plus one omo worth medxs

## 2013-11-18 NOTE — Telephone Encounter (Signed)
Pt requesting refills through pharm for simvastatin and lisinopril/hctz. Last check up 02/2013. 1 month refills plus pt needs ov and bloodwork for lipid, liver, met 7, and a1c per Dr. Richardson Landry. bloodwork orders in. [facility] [place] to notify pt about bloodwork and to schedule OV.

## 2013-11-25 NOTE — Telephone Encounter (Signed)
Patient notified and verbalized understanding. 

## 2013-12-30 LAB — LIPID PANEL
CHOLESTEROL: 182 mg/dL (ref 0–200)
HDL: 46 mg/dL (ref >39)
LDL Cholesterol: 101 mg/dL — ABNORMAL HIGH (ref 0–99)
Total CHOL/HDL Ratio: 4 Ratio
Triglycerides: 174 mg/dL — ABNORMAL HIGH (ref <150)
VLDL: 35 mg/dL (ref 0–40)

## 2013-12-30 LAB — BASIC METABOLIC PANEL
BUN: 17 mg/dL (ref 6–23)
CO2: 30 mEq/L (ref 19–32)
Calcium: 9.2 mg/dL (ref 8.4–10.5)
Chloride: 102 mEq/L (ref 96–112)
Creat: 0.89 mg/dL (ref 0.50–1.10)
GLUCOSE: 154 mg/dL — AB (ref 70–99)
POTASSIUM: 4.5 meq/L (ref 3.5–5.3)
Sodium: 138 mEq/L (ref 135–145)

## 2013-12-30 LAB — HEPATIC FUNCTION PANEL
ALT: 17 U/L (ref 0–35)
AST: 23 U/L (ref 0–37)
Albumin: 4.1 g/dL (ref 3.5–5.2)
Alkaline Phosphatase: 66 U/L (ref 39–117)
BILIRUBIN DIRECT: 0.1 mg/dL (ref 0.0–0.3)
Indirect Bilirubin: 0.3 mg/dL (ref 0.2–1.2)
Total Bilirubin: 0.4 mg/dL (ref 0.2–1.2)
Total Protein: 6.8 g/dL (ref 6.0–8.3)

## 2013-12-30 LAB — HEMOGLOBIN A1C
HEMOGLOBIN A1C: 6.6 % — AB (ref <5.7)
Mean Plasma Glucose: 143 mg/dL — ABNORMAL HIGH (ref <117)

## 2014-01-13 ENCOUNTER — Encounter: Payer: Self-pay | Admitting: Family Medicine

## 2014-01-13 ENCOUNTER — Ambulatory Visit (INDEPENDENT_AMBULATORY_CARE_PROVIDER_SITE_OTHER): Payer: 59 | Admitting: Family Medicine

## 2014-01-13 DIAGNOSIS — Z23 Encounter for immunization: Secondary | ICD-10-CM

## 2014-01-13 DIAGNOSIS — E119 Type 2 diabetes mellitus without complications: Secondary | ICD-10-CM

## 2014-01-13 DIAGNOSIS — I10 Essential (primary) hypertension: Secondary | ICD-10-CM

## 2014-01-13 DIAGNOSIS — E785 Hyperlipidemia, unspecified: Secondary | ICD-10-CM

## 2014-01-13 NOTE — Progress Notes (Signed)
   Subjective:    Patient ID: [name], female    DOB: 08-10-1955, 58 y.o.   MRN: [id number]  HPIFollow up on bloodwork.  A1C 6.6 on 12/30/13. Pt in today to discuss diabetes. Pt has been watching her diet. Cutting back on sugar and starches. Gets boot off her foot tomorrow. Will start walking when specialist releases her.   On diet for about a month so far, lost tne or twelve pounds,    gmo and bro has diabetes, but mpstly grandparents  Diet down thru the yrs not the best  Drinks diet sodas and tea with sugar  Not much exercise  Works at [employer] and g moves around some  Compliant with blood pressure medication. No obvious side effect. Has cut salt intake down.  Also history of hyperlipidemia. With medications in this regard.    Review of Systems No headache no chest pain and back pain abdominal pain no change in bowel habits ROS otherwise negative    Objective:   Physical Exam  Alert HEENT normal. Lungs clear heart regular rate and rhythm. Ankles without edema.  C diabetic foot exam    Assessment & Plan:  Impression type 2 diabetes good control. #2 hypertension good control. 3 hyperlipidemia discussed plan diet exercise discuss follow-up in several months. WSR

## 2014-01-18 DIAGNOSIS — E119 Type 2 diabetes mellitus without complications: Secondary | ICD-10-CM | POA: Insufficient documentation

## 2014-01-26 ENCOUNTER — Other Ambulatory Visit: Payer: Self-pay | Admitting: Family Medicine

## 2014-01-26 ENCOUNTER — Encounter: Payer: Self-pay | Admitting: Family Medicine

## 2014-01-27 LAB — HM DIABETES EYE EXAM

## 2014-02-10 ENCOUNTER — Encounter: Payer: Self-pay | Admitting: Adult Health

## 2014-02-10 ENCOUNTER — Ambulatory Visit (INDEPENDENT_AMBULATORY_CARE_PROVIDER_SITE_OTHER): Payer: 59 | Admitting: Adult Health

## 2014-02-10 VITALS — BP 140/90 | HR 80 | Ht 62.25 in | Wt 249.5 lb

## 2014-02-10 DIAGNOSIS — Z1212 Encounter for screening for malignant neoplasm of rectum: Secondary | ICD-10-CM

## 2014-02-10 DIAGNOSIS — Z01419 Encounter for gynecological examination (general) (routine) without abnormal findings: Secondary | ICD-10-CM

## 2014-02-10 DIAGNOSIS — B369 Superficial mycosis, unspecified: Secondary | ICD-10-CM

## 2014-02-10 DIAGNOSIS — N3946 Mixed incontinence: Secondary | ICD-10-CM

## 2014-02-10 DIAGNOSIS — Z8541 Personal history of malignant neoplasm of cervix uteri: Secondary | ICD-10-CM

## 2014-02-10 LAB — HEMOCCULT GUIAC POC 1CARD (OFFICE): Fecal Occult Blood, POC: NEGATIVE

## 2014-02-10 MED ORDER — MIRABEGRON ER 50 MG PO TB24: 50.0000 mg | ORAL_TABLET | Freq: Every day | ORAL | Status: DC

## 2014-02-10 MED ORDER — NYSTATIN 100000 UNIT/GM EX POWD: CUTANEOUS | Status: DC

## 2014-02-10 NOTE — Patient Instructions (Signed)
Urinary Incontinence Urinary incontinence is the involuntary loss of urine from your bladder. CAUSES  There are many causes of urinary incontinence. They include:  Medicines.  Infections.  Prostatic enlargement, leading to overflow of urine from your bladder.  Surgery.  Neurological diseases.  Emotional factors. SIGNS AND SYMPTOMS Urinary Incontinence can be divided into four types: 1. Urge incontinence. Urge incontinence is the involuntary loss of urine before you have the opportunity to go to the bathroom. There is a sudden urge to void but not enough time to reach a bathroom. 2. Stress incontinence. Stress incontinence is the sudden loss of urine with any activity that forces urine to pass. It is commonly caused by anatomical changes to the pelvis and sphincter areas of your body. 3. Overflow incontinence. Overflow incontinence is the loss of urine from an obstructed opening to your bladder. This results in a backup of urine and a resultant buildup of pressure within the bladder. When the pressure within the bladder exceeds the closing pressure of the sphincter, the urine overflows, which causes incontinence, similar to water overflowing a dam. 4. Total incontinence. Total incontinence is the loss of urine as a result of the inability to store urine within your bladder. DIAGNOSIS  Evaluating the cause of incontinence may require:  A thorough and complete medical and obstetric history.  A complete physical exam.  Laboratory tests such as a urine culture and sensitivities. When additional tests are indicated, they can include:  An ultrasound exam.  Kidney and bladder X-rays.  Cystoscopy. This is an exam of the bladder using a narrow scope.  Urodynamic testing to test the nerve function to the bladder and sphincter areas. TREATMENT  Treatment for urinary incontinence depends on the cause:  For urge incontinence caused by a bacterial infection, antibiotics will be prescribed.  If the urge incontinence is related to medicines you take, your health care provider may have you change the medicine.  For stress incontinence, surgery to re-establish anatomical support to the bladder or sphincter, or both, will often correct the condition.  For overflow incontinence caused by an enlarged prostate, an operation to open the channel through the enlarged prostate will allow the flow of urine out of the bladder. In women with fibroids, a hysterectomy may be recommended.  For total incontinence, surgery on your urinary sphincter may help. An artificial urinary sphincter (an inflatable cuff placed around the urethra) may be required. In women who have developed a hole-like passage between their bladder and vagina (vesicovaginal fistula), surgery to close the fistula often is required. HOME CARE INSTRUCTIONS  Normal daily hygiene and the use of pads or adult diapers that are changed regularly will help prevent odors and skin damage.  Avoid caffeine. It can overstimulate your bladder.  Use the bathroom regularly. Try about every 2-3 hours to go to the bathroom, even if you do not feel the need to do so. Take time to empty your bladder completely. After urinating, wait a minute. Then try to urinate again.  For causes involving nerve dysfunction, keep a log of the medicines you take and a journal of the times you go to the bathroom. SEEK MEDICAL CARE IF:  You experience worsening of pain instead of improvement in pain after your procedure.  Your incontinence becomes worse instead of better. SEE IMMEDIATE MEDICAL CARE IF:  You experience fever or shaking chills.  You are unable to pass your urine.  You have redness spreading into your groin or down into your thighs. MAKE SURE   YOU:   Understand these instructions.   Will watch your condition.  Will get help right away if you are not doing well or get worse. Document Released: 04/20/2004 Document Revised: 01/01/2013 Document  Reviewed: 08/20/2012 [facility] Patient Information 2015 [employer], [place]. This information is not intended to replace advice given to you by your health care provider. Make sure you discuss any questions you have with your health care provider. Try myrbetriq Use nystatin powder Follow up in 6 weeks Physical in 1 year Mammogram yearly Labs with PCP

## 2014-02-10 NOTE — Progress Notes (Signed)
Patient ID: [name], female   DOB: May 28, 1955, 58 y.o.   MRN: [id number] History of Present Illness: [name] is a 58 year old white female, married in for gyn exam.She had normal pap with negative HPV 07/05/12.She is sp hysterectomy for cervical cancer.She is complaining of urinary incontinence and skin rash.She sweats a lot, she says.She is out of work, had foot surgery and it is not healing well, wearing a boot.   Current Medications, Allergies, Past Medical History, Past Surgical History, Family History and Social History were reviewed in [employer] record.     Review of Systems: Patient denies any headaches, blurred vision, shortness of breath, chest pain, abdominal pain, problems with bowel movements, or intercourse.Not having sex, no mood swings,see HPI for positives.    Physical Exam:BP 140/90 mmHg  Pulse 80  Ht 5' 2.25" (1.581 m)  Wt 249 lb 8 oz (113.172 kg)  BMI 45.28 kg/m2 General:  Well developed, well nourished, no acute distress Skin:  Warm and dry Neck:  Midline trachea, normal thyroid Lungs; Clear to auscultation bilaterally Breast:  No dominant palpable mass, retraction, or nipple discharge, has skin fungus under breasts Cardiovascular: Regular rate and rhythm Abdomen:  Soft, non tender, no hepatosplenomegaly,obese and has skin fungus under panniculus Pelvic:  External genitalia is normal in appearance for age, no lesions.  The vagina has decreased color, moisture and rugae.The cervix and uterus are absent.  No adnexal masses or tenderness noted.Has some relaxation. Rectal: Good sphincter tone, no polyps, has hemorrhoids.  Hemoccult negative. Extremities:  No swelling or varicosities noted Psych:  No mood changes,alert and cooperative,seems happy and is dieting now.   Impression: Well woman exam no pap Mixed UI Skin fungus History of cervical cancer    Plan: Rx myrbetriq 50 mg #30 1 daily with 3 refills Rx nystatin powder to use 2-3 x  daily to affected areas with 3 refills Review handout on UI Follow up in 6 weeks Physical in 1 year Mammogram yearly Labs with PCP Colonoscopy per GI

## 2014-02-20 ENCOUNTER — Other Ambulatory Visit: Payer: Self-pay | Admitting: Family Medicine

## 2014-02-23 ENCOUNTER — Other Ambulatory Visit: Payer: Self-pay | Admitting: Family Medicine

## 2014-03-24 ENCOUNTER — Ambulatory Visit: Payer: 59 | Admitting: Adult Health

## 2014-03-24 ENCOUNTER — Encounter: Payer: Self-pay | Admitting: Adult Health

## 2014-03-24 ENCOUNTER — Ambulatory Visit (INDEPENDENT_AMBULATORY_CARE_PROVIDER_SITE_OTHER): Payer: 59 | Admitting: Adult Health

## 2014-03-24 VITALS — BP 148/80 | Ht 62.0 in | Wt 246.0 lb

## 2014-03-24 DIAGNOSIS — N3946 Mixed incontinence: Secondary | ICD-10-CM

## 2014-03-24 DIAGNOSIS — B369 Superficial mycosis, unspecified: Secondary | ICD-10-CM

## 2014-03-24 MED ORDER — MIRABEGRON ER 25 MG PO TB24: 25.0000 mg | ORAL_TABLET | Freq: Every day | ORAL | Status: DC

## 2014-03-24 NOTE — Progress Notes (Signed)
Subjective:     Patient ID: [name], female   DOB: 03/20/1956, 58 y.o.   MRN: [id number]  HPI [name] is a 58 year old white female in for follow up of UI and skin fungus.  Review of Systems See HPI Reviewed past medical,surgical, social and family history. Reviewed medications and allergies.     Objective:   Physical Exam BP 148/80 mmHg  Ht 5\' 2"  (1.575 m)  Wt 246 lb (111.585 kg)  BMI 44.98 kg/m2   Skin fungus much better with nystatin powder and myrbetriq helped a lot with UI, wants to continue  She has lost 3.5 lbs too. Assessment:     Mixed UI Skin fungus resolving    Plan:     Refilled myrbetriq 25 mg #30 1 daily with 11 refills Continue nystatin powder Follow up prn

## 2014-03-24 NOTE — Patient Instructions (Signed)
Follow up prn

## 2014-04-02 ENCOUNTER — Other Ambulatory Visit: Payer: Self-pay | Admitting: Family Medicine

## 2014-04-15 ENCOUNTER — Encounter: Payer: Self-pay | Admitting: Family Medicine

## 2014-04-15 ENCOUNTER — Ambulatory Visit (INDEPENDENT_AMBULATORY_CARE_PROVIDER_SITE_OTHER): Payer: 59 | Admitting: Family Medicine

## 2014-04-15 VITALS — BP 100/80 | Ht 63.0 in | Wt 244.5 lb

## 2014-04-15 DIAGNOSIS — E119 Type 2 diabetes mellitus without complications: Secondary | ICD-10-CM

## 2014-04-15 LAB — POCT GLYCOSYLATED HEMOGLOBIN (HGB A1C): HEMOGLOBIN A1C: 6

## 2014-04-15 NOTE — Progress Notes (Signed)
   Subjective:    Patient ID: [name], female    DOB: 09/22/1955, 58 y.o.   MRN: [id number]  Diabetes She presents for her follow-up diabetic visit. She has type 2 diabetes mellitus. Her disease course has been stable. There are no hypoglycemic associated symptoms. There are no diabetic associated symptoms. There are no hypoglycemic complications. Symptoms are stable. There are no diabetic complications. There are no known risk factors for coronary artery disease. Current diabetic treatment includes diet. She is compliant with treatment all of the time.   Patient states she has no other concerns at this time.  Still on nutra syst watching diet closely , and a lot of water  Results for orders placed or performed in visit on 04/15/14  POCT glycosylated hemoglobin (Hb A1C)  Result Value Ref Range   Hemoglobin A1C 6.0    Watching diet  Works at [employer] and g sits andnstand s etc.  Had flu shot  Saw an eye doc, nov or dec and eyes weere good   Compliant with blood pressure medication. No obvious side effects. Watching her salt intake. Not able to exercise at this point because of healing from recent foot surgery.  Claims compliance with lipid medication. No obvious side effects. Watching fat in her diet. Next  Frustrated about weight. Has lost a few pounds but not as much as he hoped. Notes shortness of breath with exertion. Review of Systems No headache no chest pain no back pain no abdominal pain chronic foot pain    Objective:   Physical Exam Alert no acute distress blood pressure excellent on repeat H&T normal. Lungs clear. Heart regular in rhythm. Ankles without edema.       Assessment & Plan:  Impression 1 type 2 diabetes good control discussed #2 hypertension good control discussed over 3 hyperlipidemia discussed #4 weight gain with dyspnea with exertion due to deconditioning discussed plan maintain same medication. Diet discussed exercise discussed. Encouraged to advance  exercise. Follow-up in several months. Blood work then. If all results good can back off to every 6 month visit. WSL

## 2014-05-18 ENCOUNTER — Other Ambulatory Visit: Payer: Self-pay | Admitting: Family Medicine

## 2014-06-14 ENCOUNTER — Other Ambulatory Visit: Payer: Self-pay | Admitting: Family Medicine

## 2014-07-07 ENCOUNTER — Other Ambulatory Visit: Payer: Self-pay | Admitting: *Deleted

## 2014-07-07 DIAGNOSIS — E119 Type 2 diabetes mellitus without complications: Secondary | ICD-10-CM

## 2014-07-07 DIAGNOSIS — E785 Hyperlipidemia, unspecified: Secondary | ICD-10-CM

## 2014-07-07 DIAGNOSIS — Z79899 Other long term (current) drug therapy: Secondary | ICD-10-CM

## 2014-07-08 LAB — LIPID PANEL
CHOL/HDL RATIO: 4.2 ratio (ref 0.0–4.4)
CHOLESTEROL TOTAL: 233 mg/dL — AB (ref 100–199)
HDL: 56 mg/dL (ref >39)
LDL Calculated: 154 mg/dL — ABNORMAL HIGH (ref 0–99)
TRIGLYCERIDES: 114 mg/dL (ref 0–149)
VLDL Cholesterol Cal: 23 mg/dL (ref 5–40)

## 2014-07-08 LAB — HEPATIC FUNCTION PANEL
ALT: 20 IU/L (ref 0–32)
AST: 19 IU/L (ref 0–40)
Albumin: 4.4 g/dL (ref 3.5–5.5)
Alkaline Phosphatase: 65 IU/L (ref 39–117)
BILIRUBIN TOTAL: 0.4 mg/dL (ref 0.0–1.2)
Bilirubin, Direct: 0.09 mg/dL (ref 0.00–0.40)
Total Protein: 6.9 g/dL (ref 6.0–8.5)

## 2014-07-08 LAB — HEMOGLOBIN A1C
Est. average glucose Bld gHb Est-mCnc: 131 mg/dL
Hgb A1c MFr Bld: 6.2 % — ABNORMAL HIGH (ref 4.8–5.6)

## 2014-07-15 ENCOUNTER — Ambulatory Visit (INDEPENDENT_AMBULATORY_CARE_PROVIDER_SITE_OTHER): Payer: 59 | Admitting: Family Medicine

## 2014-07-15 ENCOUNTER — Encounter: Payer: Self-pay | Admitting: Family Medicine

## 2014-07-15 VITALS — BP 124/86 | Ht 63.0 in | Wt 246.0 lb

## 2014-07-15 DIAGNOSIS — E785 Hyperlipidemia, unspecified: Secondary | ICD-10-CM | POA: Diagnosis not present

## 2014-07-15 DIAGNOSIS — E119 Type 2 diabetes mellitus without complications: Secondary | ICD-10-CM

## 2014-07-15 DIAGNOSIS — I10 Essential (primary) hypertension: Secondary | ICD-10-CM | POA: Diagnosis not present

## 2014-07-15 MED ORDER — LISINOPRIL-HYDROCHLOROTHIAZIDE 10-12.5 MG PO TABS: 1.0000 | ORAL_TABLET | Freq: Every day | ORAL | Status: DC

## 2014-07-15 MED ORDER — SIMVASTATIN 40 MG PO TABS: ORAL_TABLET | ORAL | Status: DC

## 2014-07-15 NOTE — Patient Instructions (Signed)
Results for orders placed or performed in visit on 07/07/14  Lipid panel  Result Value Ref Range   Cholesterol, Total 233 (H) 100 - 199 mg/dL   Triglycerides 114 0 - 149 mg/dL   HDL 56 >39 mg/dL   VLDL Cholesterol Cal 23 5 - 40 mg/dL   LDL Calculated 154 (H) 0 - 99 mg/dL   Chol/HDL Ratio 4.2 0.0 - 4.4 ratio units  Hepatic function panel  Result Value Ref Range   Total Protein 6.9 6.0 - 8.5 g/dL   Albumin 4.4 3.5 - 5.5 g/dL   Bilirubin Total 0.4 0.0 - 1.2 mg/dL   Bilirubin, Direct 0.09 0.00 - 0.40 mg/dL   Alkaline Phosphatase 65 39 - 117 IU/L   AST 19 0 - 40 IU/L   ALT 20 0 - 32 IU/L  Hemoglobin A1c  Result Value Ref Range   Hgb A1c MFr Bld 6.2 (H) 4.8 - 5.6 %   Est. average glucose Bld gHb Est-mCnc 131 mg/dL

## 2014-07-15 NOTE — Progress Notes (Signed)
   Subjective:    Patient ID: [name], female    DOB: April 28, 1955, 59 y.o.   MRN: [id number]  Diabetes She presents for her follow-up diabetic visit. She has type 2 diabetes mellitus. Her disease course has been stable. There are no hypoglycemic associated symptoms. There are no diabetic associated symptoms. There are no hypoglycemic complications. Symptoms are stable. There are no diabetic complications. There are no known risk factors for coronary artery disease. When asked about current treatments, none were reported. She is compliant with treatment all of the time.  Patient had blood work completed and A1C was included.  No other concerns at this time.   Results for orders placed or performed in visit on 07/07/14  Lipid panel  Result Value Ref Range   Cholesterol, Total 233 (H) 100 - 199 mg/dL   Triglycerides 114 0 - 149 mg/dL   HDL 56 >39 mg/dL   VLDL Cholesterol Cal 23 5 - 40 mg/dL   LDL Calculated 154 (H) 0 - 99 mg/dL   Chol/HDL Ratio 4.2 0.0 - 4.4 ratio units  Hepatic function panel  Result Value Ref Range   Total Protein 6.9 6.0 - 8.5 g/dL   Albumin 4.4 3.5 - 5.5 g/dL   Bilirubin Total 0.4 0.0 - 1.2 mg/dL   Bilirubin, Direct 0.09 0.00 - 0.40 mg/dL   Alkaline Phosphatase 65 39 - 117 IU/L   AST 19 0 - 40 IU/L   ALT 20 0 - 32 IU/L  Hemoglobin A1c  Result Value Ref Range   Hgb A1c MFr Bld 6.2 (H) 4.8 - 5.6 %   Est. average glucose Bld gHb Est-mCnc 131 mg/dL   Patient compliant with blood pressure medicine. No obvious side effects. Has cut down salt intake. Blood pressure decent when checked elsewhere.  Compliant with lipid medicine except she did miss about a week right before blood work done. No obvious side effects.  Review of Systems Some fatigue ongoing weight gain ongoing stress ongoing foot troubles sees orthopedist    Objective:   Physical Exam Alert vitals stable blood pressure good on repeat HEENT normal lungs clear heart regular in rhythm. Ankles without  edema       Assessment & Plan:  Impression 1 hypertension good control #2 type 2 diabetes control good discuss. #3 hyperlipidemia control not good suboptimum at this time. Likely due to missing medication. Plan diet discussed exercise discussed recheck in 6 months. WSL

## 2014-08-12 ENCOUNTER — Other Ambulatory Visit: Payer: Self-pay | Admitting: Orthopedic Surgery

## 2014-08-12 DIAGNOSIS — M25572 Pain in left ankle and joints of left foot: Secondary | ICD-10-CM

## 2014-08-14 ENCOUNTER — Ambulatory Visit
Admission: RE | Admit: 2014-08-14 | Discharge: 2014-08-14 | Disposition: A | Discharge status: Home / Self Care | Payer: 59 | Source: Ambulatory Visit | Attending: Orthopedic Surgery | Admitting: Orthopedic Surgery

## 2014-08-14 DIAGNOSIS — M25572 Pain in left ankle and joints of left foot: Secondary | ICD-10-CM

## 2014-08-21 ENCOUNTER — Encounter (HOSPITAL_COMMUNITY): Payer: Self-pay | Admitting: Pharmacy Technician

## 2014-08-25 ENCOUNTER — Encounter (HOSPITAL_COMMUNITY): Payer: Self-pay | Admitting: *Deleted

## 2014-08-25 ENCOUNTER — Other Ambulatory Visit (HOSPITAL_COMMUNITY): Payer: Self-pay | Admitting: Orthopedic Surgery

## 2014-08-25 MED ORDER — CEFAZOLIN SODIUM-DEXTROSE 2-3 GM-% IV SOLR
2.0000 g | INTRAVENOUS | Status: AC
  Administered 2014-08-26: 2 g via INTRAVENOUS
  Filled 2014-08-25: qty 50

## 2014-08-26 ENCOUNTER — Encounter (HOSPITAL_COMMUNITY)
Admission: RE | Disposition: A | Discharge status: Home / Self Care | Payer: Self-pay | Source: Ambulatory Visit | Attending: Orthopedic Surgery

## 2014-08-26 ENCOUNTER — Encounter (HOSPITAL_COMMUNITY): Payer: Self-pay | Admitting: *Deleted

## 2014-08-26 ENCOUNTER — Ambulatory Visit (HOSPITAL_COMMUNITY): Payer: 59 | Admitting: Certified Registered Nurse Anesthetist

## 2014-08-26 ENCOUNTER — Ambulatory Visit (HOSPITAL_COMMUNITY)
Admission: RE | Admit: 2014-08-26 | Discharge: 2014-08-27 | Disposition: A | Discharge status: Home / Self Care | Payer: 59 | Source: Ambulatory Visit | Attending: Orthopedic Surgery | Admitting: Orthopedic Surgery

## 2014-08-26 DIAGNOSIS — Z8541 Personal history of malignant neoplasm of cervix uteri: Secondary | ICD-10-CM | POA: Insufficient documentation

## 2014-08-26 DIAGNOSIS — N3946 Mixed incontinence: Secondary | ICD-10-CM | POA: Insufficient documentation

## 2014-08-26 DIAGNOSIS — Y838 Other surgical procedures as the cause of abnormal reaction of the patient, or of later complication, without mention of misadventure at the time of the procedure: Secondary | ICD-10-CM | POA: Insufficient documentation

## 2014-08-26 DIAGNOSIS — I10 Essential (primary) hypertension: Secondary | ICD-10-CM | POA: Insufficient documentation

## 2014-08-26 DIAGNOSIS — Z96651 Presence of right artificial knee joint: Secondary | ICD-10-CM | POA: Diagnosis not present

## 2014-08-26 DIAGNOSIS — J45909 Unspecified asthma, uncomplicated: Secondary | ICD-10-CM | POA: Diagnosis not present

## 2014-08-26 DIAGNOSIS — Z886 Allergy status to analgesic agent status: Secondary | ICD-10-CM | POA: Insufficient documentation

## 2014-08-26 DIAGNOSIS — E119 Type 2 diabetes mellitus without complications: Secondary | ICD-10-CM | POA: Insufficient documentation

## 2014-08-26 DIAGNOSIS — E780 Pure hypercholesterolemia: Secondary | ICD-10-CM | POA: Diagnosis not present

## 2014-08-26 DIAGNOSIS — T8489XA Other specified complication of internal orthopedic prosthetic devices, implants and grafts, initial encounter: Secondary | ICD-10-CM | POA: Diagnosis not present

## 2014-08-26 DIAGNOSIS — M24673 Ankylosis, unspecified ankle: Secondary | ICD-10-CM | POA: Diagnosis present

## 2014-08-26 DIAGNOSIS — E669 Obesity, unspecified: Secondary | ICD-10-CM | POA: Diagnosis not present

## 2014-08-26 DIAGNOSIS — Z90710 Acquired absence of both cervix and uterus: Secondary | ICD-10-CM | POA: Diagnosis not present

## 2014-08-26 DIAGNOSIS — Z87891 Personal history of nicotine dependence: Secondary | ICD-10-CM | POA: Insufficient documentation

## 2014-08-26 DIAGNOSIS — Z981 Arthrodesis status: Secondary | ICD-10-CM | POA: Insufficient documentation

## 2014-08-26 DIAGNOSIS — M24672 Ankylosis, left ankle: Secondary | ICD-10-CM

## 2014-08-26 HISTORY — DX: Shortness of breath: R06.02

## 2014-08-26 HISTORY — DX: Family history of other specified conditions: Z84.89

## 2014-08-26 HISTORY — DX: Unspecified asthma, uncomplicated: J45.909

## 2014-08-26 HISTORY — DX: Malignant neoplasm of cervix uteri, unspecified: C53.9

## 2014-08-26 HISTORY — PX: ANKLE FUSION: SHX5718

## 2014-08-26 LAB — COMPREHENSIVE METABOLIC PANEL
ALK PHOS: 52 U/L (ref 38–126)
ALT: 15 U/L (ref 14–54)
AST: 18 U/L (ref 15–41)
Albumin: 3.6 g/dL (ref 3.5–5.0)
Anion gap: 9 (ref 5–15)
BILIRUBIN TOTAL: 0.4 mg/dL (ref 0.3–1.2)
BUN: 18 mg/dL (ref 6–20)
CALCIUM: 9.3 mg/dL (ref 8.9–10.3)
CHLORIDE: 105 mmol/L (ref 101–111)
CO2: 26 mmol/L (ref 22–32)
CREATININE: 0.79 mg/dL (ref 0.44–1.00)
GFR calc Af Amer: >60 mL/min (ref >60)
GFR calc non Af Amer: >60 mL/min (ref >60)
Glucose, Bld: 140 mg/dL — ABNORMAL HIGH (ref 65–99)
Potassium: 4.5 mmol/L (ref 3.5–5.1)
Sodium: 140 mmol/L (ref 135–145)
TOTAL PROTEIN: 6.6 g/dL (ref 6.5–8.1)

## 2014-08-26 LAB — CBC
HEMATOCRIT: 36.9 % (ref 36.0–46.0)
HEMOGLOBIN: 12.6 g/dL (ref 12.0–15.0)
MCH: 30.3 pg (ref 26.0–34.0)
MCHC: 34.1 g/dL (ref 30.0–36.0)
MCV: 88.7 fL (ref 78.0–100.0)
PLATELETS: 232 10*3/uL (ref 150–400)
RBC: 4.16 MIL/uL (ref 3.87–5.11)
RDW: 12.8 % (ref 11.5–15.5)
WBC: 6.1 10*3/uL (ref 4.0–10.5)

## 2014-08-26 LAB — PROTIME-INR
INR: 0.98 (ref 0.00–1.49)
Prothrombin Time: 13.2 seconds (ref 11.6–15.2)

## 2014-08-26 LAB — APTT: APTT: 28 s (ref 24–37)

## 2014-08-26 SURGERY — ANKLE FUSION
Anesthesia: General | Site: Foot | Laterality: Left

## 2014-08-26 MED ORDER — METOCLOPRAMIDE HCL 5 MG PO TABS: 5.0000 mg | ORAL_TABLET | Freq: Three times a day (TID) | ORAL | Status: DC | PRN

## 2014-08-26 MED ORDER — LACTATED RINGERS IV SOLN
INTRAVENOUS | Status: DC
  Administered 2014-08-26: 50 mL/h via INTRAVENOUS
  Administered 2014-08-26: 13:00:00 via INTRAVENOUS

## 2014-08-26 MED ORDER — ASPIRIN 81 MG PO TABS: 81.0000 mg | ORAL_TABLET | Freq: Every day | ORAL | Status: DC

## 2014-08-26 MED ORDER — ACETAMINOPHEN 325 MG PO TABS
650.0000 mg | ORAL_TABLET | Freq: Four times a day (QID) | ORAL | Status: DC | PRN
  Administered 2014-08-27: 650 mg via ORAL
  Filled 2014-08-26: qty 2

## 2014-08-26 MED ORDER — FENTANYL CITRATE (PF) 250 MCG/5ML IJ SOLN
INTRAMUSCULAR | Status: AC
  Filled 2014-08-26: qty 5

## 2014-08-26 MED ORDER — ONDANSETRON HCL 4 MG/2ML IJ SOLN
4.0000 mg | Freq: Four times a day (QID) | INTRAMUSCULAR | Status: DC | PRN
  Administered 2014-08-26: 4 mg via INTRAVENOUS
  Filled 2014-08-26 (×2): qty 2

## 2014-08-26 MED ORDER — CEFAZOLIN SODIUM-DEXTROSE 2-3 GM-% IV SOLR
2.0000 g | Freq: Four times a day (QID) | INTRAVENOUS | Status: AC
  Administered 2014-08-26 – 2014-08-27 (×3): 2 g via INTRAVENOUS
  Filled 2014-08-26 (×3): qty 50

## 2014-08-26 MED ORDER — FENTANYL CITRATE (PF) 100 MCG/2ML IJ SOLN
INTRAMUSCULAR | Status: DC | PRN
  Administered 2014-08-26: 100 ug via INTRAVENOUS
  Administered 2014-08-26: 25 ug via INTRAVENOUS
  Administered 2014-08-26: 50 ug via INTRAVENOUS
  Administered 2014-08-26 (×2): 25 ug via INTRAVENOUS
  Administered 2014-08-26: 50 ug via INTRAVENOUS
  Administered 2014-08-26: 25 ug via INTRAVENOUS

## 2014-08-26 MED ORDER — OXYCODONE HCL 5 MG PO TABS
5.0000 mg | ORAL_TABLET | ORAL | Status: DC | PRN
  Administered 2014-08-26: 10 mg via ORAL

## 2014-08-26 MED ORDER — OXYCODONE-ACETAMINOPHEN 5-325 MG PO TABS: 1.0000 | ORAL_TABLET | ORAL | Status: DC | PRN

## 2014-08-26 MED ORDER — DEXAMETHASONE SODIUM PHOSPHATE 4 MG/ML IJ SOLN
INTRAMUSCULAR | Status: DC | PRN
  Administered 2014-08-26: 4 mg via INTRAVENOUS

## 2014-08-26 MED ORDER — METHOCARBAMOL 500 MG PO TABS
500.0000 mg | ORAL_TABLET | Freq: Four times a day (QID) | ORAL | Status: DC | PRN
  Administered 2014-08-26: 500 mg via ORAL

## 2014-08-26 MED ORDER — SUCCINYLCHOLINE CHLORIDE 20 MG/ML IJ SOLN
INTRAMUSCULAR | Status: AC
  Filled 2014-08-26: qty 1

## 2014-08-26 MED ORDER — PROPOFOL 10 MG/ML IV BOLUS
INTRAVENOUS | Status: AC
  Filled 2014-08-26: qty 20

## 2014-08-26 MED ORDER — ONDANSETRON HCL 4 MG/2ML IJ SOLN: 4.0000 mg | Freq: Once | INTRAMUSCULAR | Status: DC | PRN

## 2014-08-26 MED ORDER — ONDANSETRON HCL 4 MG/2ML IJ SOLN
INTRAMUSCULAR | Status: AC
  Filled 2014-08-26: qty 2

## 2014-08-26 MED ORDER — FENTANYL CITRATE (PF) 100 MCG/2ML IJ SOLN
INTRAMUSCULAR | Status: AC
  Filled 2014-08-26: qty 2

## 2014-08-26 MED ORDER — HYDROCHLOROTHIAZIDE 12.5 MG PO CAPS
12.5000 mg | ORAL_CAPSULE | Freq: Every day | ORAL | Status: DC
  Administered 2014-08-26 – 2014-08-27 (×2): 12.5 mg via ORAL
  Filled 2014-08-26 (×2): qty 1

## 2014-08-26 MED ORDER — GLYCOPYRROLATE 0.2 MG/ML IJ SOLN
INTRAMUSCULAR | Status: DC | PRN
  Administered 2014-08-26: 0.2 mg via INTRAVENOUS

## 2014-08-26 MED ORDER — EPHEDRINE SULFATE 50 MG/ML IJ SOLN
INTRAMUSCULAR | Status: DC | PRN
  Administered 2014-08-26: 5 mg via INTRAVENOUS

## 2014-08-26 MED ORDER — PROPOFOL 10 MG/ML IV BOLUS
INTRAVENOUS | Status: DC | PRN
  Administered 2014-08-26: 200 mg via INTRAVENOUS

## 2014-08-26 MED ORDER — DEXTROSE 5 % IV SOLN
500.0000 mg | Freq: Four times a day (QID) | INTRAVENOUS | Status: DC | PRN
  Filled 2014-08-26: qty 5

## 2014-08-26 MED ORDER — NEOSTIGMINE METHYLSULFATE 10 MG/10ML IV SOLN
INTRAVENOUS | Status: AC
  Filled 2014-08-26: qty 1

## 2014-08-26 MED ORDER — METHOCARBAMOL 500 MG PO TABS
ORAL_TABLET | ORAL | Status: AC
  Filled 2014-08-26: qty 1

## 2014-08-26 MED ORDER — ASPIRIN EC 325 MG PO TBEC: 325.0000 mg | DELAYED_RELEASE_TABLET | Freq: Every day | ORAL | Status: DC

## 2014-08-26 MED ORDER — STERILE WATER FOR INJECTION IJ SOLN
INTRAMUSCULAR | Status: AC
  Filled 2014-08-26: qty 20

## 2014-08-26 MED ORDER — ACETAMINOPHEN 650 MG RE SUPP: 650.0000 mg | Freq: Four times a day (QID) | RECTAL | Status: DC | PRN

## 2014-08-26 MED ORDER — PHENYLEPHRINE HCL 10 MG/ML IJ SOLN
INTRAMUSCULAR | Status: DC | PRN
  Administered 2014-08-26 (×3): 40 ug via INTRAVENOUS

## 2014-08-26 MED ORDER — ARTIFICIAL TEARS OP OINT
TOPICAL_OINTMENT | OPHTHALMIC | Status: AC
  Filled 2014-08-26: qty 3.5

## 2014-08-26 MED ORDER — EPHEDRINE SULFATE 50 MG/ML IJ SOLN
INTRAMUSCULAR | Status: AC
  Filled 2014-08-26: qty 2

## 2014-08-26 MED ORDER — ONDANSETRON HCL 4 MG PO TABS: 4.0000 mg | ORAL_TABLET | Freq: Four times a day (QID) | ORAL | Status: DC | PRN

## 2014-08-26 MED ORDER — LISINOPRIL-HYDROCHLOROTHIAZIDE 10-12.5 MG PO TABS: 1.0000 | ORAL_TABLET | Freq: Every day | ORAL | Status: DC

## 2014-08-26 MED ORDER — HYDROMORPHONE HCL 1 MG/ML IJ SOLN
0.5000 mg | INTRAMUSCULAR | Status: AC | PRN
  Administered 2014-08-26 (×4): 0.5 mg via INTRAVENOUS

## 2014-08-26 MED ORDER — HYDROMORPHONE HCL 1 MG/ML IJ SOLN
1.0000 mg | INTRAMUSCULAR | Status: DC | PRN
  Administered 2014-08-26: 1 mg via INTRAVENOUS
  Filled 2014-08-26: qty 1

## 2014-08-26 MED ORDER — ROCURONIUM BROMIDE 50 MG/5ML IV SOLN
INTRAVENOUS | Status: AC
  Filled 2014-08-26: qty 1

## 2014-08-26 MED ORDER — OXYCODONE HCL 5 MG PO TABS
ORAL_TABLET | ORAL | Status: AC
  Filled 2014-08-26: qty 2

## 2014-08-26 MED ORDER — ONDANSETRON HCL 4 MG/2ML IJ SOLN
INTRAMUSCULAR | Status: DC | PRN
  Administered 2014-08-26: 4 mg via INTRAVENOUS

## 2014-08-26 MED ORDER — HYDROMORPHONE HCL 1 MG/ML IJ SOLN
INTRAMUSCULAR | Status: AC
  Administered 2014-08-26: 0.5 mg via INTRAVENOUS
  Filled 2014-08-26: qty 1

## 2014-08-26 MED ORDER — 0.9 % SODIUM CHLORIDE (POUR BTL) OPTIME
TOPICAL | Status: DC | PRN
  Administered 2014-08-26: 1000 mL

## 2014-08-26 MED ORDER — LIDOCAINE HCL (CARDIAC) 20 MG/ML IV SOLN
INTRAVENOUS | Status: DC | PRN
  Administered 2014-08-26: 80 mg via INTRAVENOUS

## 2014-08-26 MED ORDER — LIDOCAINE HCL (CARDIAC) 20 MG/ML IV SOLN
INTRAVENOUS | Status: AC
  Filled 2014-08-26: qty 10

## 2014-08-26 MED ORDER — LISINOPRIL 10 MG PO TABS
10.0000 mg | ORAL_TABLET | Freq: Every day | ORAL | Status: DC
  Administered 2014-08-26 – 2014-08-27 (×2): 10 mg via ORAL
  Filled 2014-08-26 (×2): qty 1

## 2014-08-26 MED ORDER — MIDAZOLAM HCL 2 MG/2ML IJ SOLN
INTRAMUSCULAR | Status: AC
  Filled 2014-08-26: qty 2

## 2014-08-26 MED ORDER — ASPIRIN EC 81 MG PO TBEC
81.0000 mg | DELAYED_RELEASE_TABLET | Freq: Every day | ORAL | Status: DC
  Administered 2014-08-26 – 2014-08-27 (×2): 81 mg via ORAL
  Filled 2014-08-26 (×2): qty 1

## 2014-08-26 MED ORDER — SODIUM CHLORIDE 0.9 % IV SOLN
INTRAVENOUS | Status: DC
  Administered 2014-08-26: 18:00:00 via INTRAVENOUS

## 2014-08-26 MED ORDER — SODIUM CHLORIDE 0.9 % IJ SOLN
INTRAMUSCULAR | Status: AC
  Filled 2014-08-26: qty 20

## 2014-08-26 MED ORDER — PHENYLEPHRINE 40 MCG/ML (10ML) SYRINGE FOR IV PUSH (FOR BLOOD PRESSURE SUPPORT)
PREFILLED_SYRINGE | INTRAVENOUS | Status: AC
  Filled 2014-08-26: qty 10

## 2014-08-26 MED ORDER — METOCLOPRAMIDE HCL 5 MG/ML IJ SOLN: 5.0000 mg | Freq: Three times a day (TID) | INTRAMUSCULAR | Status: DC | PRN

## 2014-08-26 SURGICAL SUPPLY — 44 items
BANDAGE ESMARK 6X9 LF (GAUZE/BANDAGES/DRESSINGS) ×1 IMPLANT
BLADE SAW SGTL HD 18.5X60.5X1. (BLADE) ×2 IMPLANT
BLADE SURG 10 STRL SS (BLADE) IMPLANT
BNDG CMPR 9X6 STRL LF SNTH (GAUZE/BANDAGES/DRESSINGS) ×1
BNDG COHESIVE 4X5 TAN STRL (GAUZE/BANDAGES/DRESSINGS) ×2 IMPLANT
BNDG ESMARK 6X9 LF (GAUZE/BANDAGES/DRESSINGS) ×2
BNDG GAUZE ELAST 4 BULKY (GAUZE/BANDAGES/DRESSINGS) ×3 IMPLANT
BUR SURG 4X8 MED (BURR) IMPLANT
BURR SURG 4X8 MED (BURR) ×2
COVER MAYO STAND STRL (DRAPES) IMPLANT
COVER SURGICAL LIGHT HANDLE (MISCELLANEOUS) ×4 IMPLANT
DRAPE OEC MINIVIEW 54X84 (DRAPES) IMPLANT
DRAPE U-SHAPE 47X51 STRL (DRAPES) ×2 IMPLANT
DRSG ADAPTIC 3X8 NADH LF (GAUZE/BANDAGES/DRESSINGS) ×2 IMPLANT
DRSG PAD ABDOMINAL 8X10 ST (GAUZE/BANDAGES/DRESSINGS) ×2 IMPLANT
DURAPREP 26ML APPLICATOR (WOUND CARE) ×2 IMPLANT
ELECT REM PT RETURN 9FT ADLT (ELECTROSURGICAL) ×2
ELECTRODE REM PT RTRN 9FT ADLT (ELECTROSURGICAL) ×1 IMPLANT
GAUZE SPONGE 4X4 12PLY STRL (GAUZE/BANDAGES/DRESSINGS) ×2 IMPLANT
GLOVE BIOGEL PI IND STRL 9 (GLOVE) ×1 IMPLANT
GLOVE BIOGEL PI INDICATOR 9 (GLOVE) ×1
GLOVE SURG ORTHO 9.0 STRL STRW (GLOVE) ×2 IMPLANT
GOWN STRL REUS W/ TWL LRG LVL3 (GOWN DISPOSABLE) ×1 IMPLANT
GOWN STRL REUS W/ TWL XL LVL3 (GOWN DISPOSABLE) ×1 IMPLANT
GOWN STRL REUS W/TWL LRG LVL3 (GOWN DISPOSABLE) ×2
GOWN STRL REUS W/TWL XL LVL3 (GOWN DISPOSABLE) ×2
GUIDEWIRE THREADED 1.6 (WIRE) ×1 IMPLANT
GUIDEWIRE THREADED 2.8 (WIRE) ×1 IMPLANT
KIT BASIN OR (CUSTOM PROCEDURE TRAY) ×2 IMPLANT
KIT ROOM TURNOVER OR (KITS) ×2 IMPLANT
MIX DBX 10CC 35% BONE (Bone Implant) ×1 IMPLANT
NS IRRIG 1000ML POUR BTL (IV SOLUTION) ×2 IMPLANT
PACK ORTHO EXTREMITY (CUSTOM PROCEDURE TRAY) ×2 IMPLANT
PAD ARMBOARD 7.5X6 YLW CONV (MISCELLANEOUS) ×4 IMPLANT
SCREW CANN 16 THRD/70 7.3 (Screw) ×1 IMPLANT
SCREW CANN P THRD/50 4.5 (Screw) ×2 IMPLANT
SPONGE GAUZE 4X4 12PLY STER LF (GAUZE/BANDAGES/DRESSINGS) ×1 IMPLANT
SPONGE LAP 18X18 X RAY DECT (DISPOSABLE) ×2 IMPLANT
SUCTION FRAZIER TIP 10 FR DISP (SUCTIONS) ×2 IMPLANT
SUT ETHILON 2 0 PSLX (SUTURE) ×6 IMPLANT
TOWEL OR 17X24 6PK STRL BLUE (TOWEL DISPOSABLE) ×2 IMPLANT
TOWEL OR 17X26 10 PK STRL BLUE (TOWEL DISPOSABLE) ×2 IMPLANT
TUBE CONNECTING 12X1/4 (SUCTIONS) ×2 IMPLANT
WATER STERILE IRR 1000ML POUR (IV SOLUTION) ×2 IMPLANT

## 2014-08-26 NOTE — Anesthesia Procedure Notes (Signed)
Procedure Name: LMA Insertion Date/Time: 08/26/2014 12:22 PM Performed by: [place] Pink Pre-anesthesia Checklist: Patient identified, Emergency Drugs available, Suction available, Patient being monitored and Timeout performed Patient Re-evaluated:Patient Re-evaluated prior to inductionOxygen Delivery Method: Circle system utilized Preoxygenation: Pre-oxygenation with 100% oxygen Intubation Type: IV induction LMA: LMA inserted LMA Size: 4.0 Number of attempts: 1 Placement Confirmation: positive ETCO2 and breath sounds checked- equal and bilateral Tube secured with: Tape Dental Injury: Teeth and Oropharynx as per pre-operative assessment

## 2014-08-26 NOTE — Anesthesia Preprocedure Evaluation (Signed)
Anesthesia Evaluation  Patient identified by MRN, date of birth, ID band Patient awake    Reviewed: Allergy & Precautions, NPO status , Patient's Chart, lab work & pertinent test results  History of Anesthesia Complications (+) PONV  Airway Mallampati: I       Dental   Pulmonary asthma , Current Smoker, former smoker,    Pulmonary exam normal       Cardiovascular hypertension, Normal cardiovascular exam    Neuro/Psych    GI/Hepatic   Endo/Other  diabetes  Renal/GU      Musculoskeletal  (+) Arthritis -,   Abdominal   Peds  Hematology   Anesthesia Other Findings   Reproductive/Obstetrics                             Anesthesia Physical Anesthesia Plan  ASA: II  Anesthesia Plan: General   Post-op Pain Management:    Induction: Intravenous  Airway Management Planned: Oral ETT and LMA  Additional Equipment:   Intra-op Plan:   Post-operative Plan: Extubation in OR  Informed Consent: I have reviewed the patients History and Physical, chart, labs and discussed the procedure including the risks, benefits and alternatives for the proposed anesthesia with the patient or authorized representative who has indicated his/her understanding and acceptance.     Plan Discussed with: CRNA, Anesthesiologist and Surgeon  Anesthesia Plan Comments:         Anesthesia Quick Evaluation

## 2014-08-26 NOTE — Op Note (Signed)
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  with Revision Talonavicular and Subtalar Fusion  SURGEON:  DUDA,MARCUS V, MD  PHYSICIAN ASSISTANT:None ANESTHESIA:   General  PREOPERATIVE INDICATIONS:  [name] is a  59 y.o. 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    The risks benefits and alternatives were discussed with the patient preoperatively including but not limited to the risks of infection, bleeding, nerve injury, cardiopulmonary complications, the need for revision surgery, among others, and the patient was willing to proceed.  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 After adequate levels anesthesia obtained patient's left lower extremity was prepped using DuraPrep draped into a sterile field. A timeout was called. The 3 previous incisions were used. Attention was first focused over the sinus Tarsi this incision was carried down to the subtalar joint. A bur was used to debride the sclerotic bone. There was fibrous tissue at the fusion site and this was also debrided. The heel incision was opened and the 2 headless compression screws were removed. Further debridement with the bur was used in the posterior facet and the subtalar joint. This was debrided back to bleeding viable bone. Attention was then focused on the talonavicular joint. A bur was used to debride down to the talonavicular joint. The 2 compression screws were removed and there was  fibrous tissue with the union site of the talonavicular joint. The bur was used to debride this back to bleeding viable bone. The foot was held plantigrade with the ankle at 90 and 2 K wires were inserted from the navicular into the talus C-arm floss be verified alignment and the screws were inserted and secured. Prior to insertion the screws demineralized bone matrix was placed at the fusion site. Attention was then focused on the subtalar joint. A new guidewire was used to insert from the calcaneus into the talus. C-arm floss be verified alignment. A 7.3 cannulated screw was inserted prior to compression the joint was packed with demineralized bone matrix a total 10 mL of demineralized bone matrix was used. C-arm floss be verified alignment. The incisions were closed using 2-0 nylon. A sterile compressive dressing was applied. Patient was extubated taken to the PACU in stable condition.

## 2014-08-26 NOTE — H&P (Signed)
[name] is an 59 y.o. female.   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 Patient has developed a fibrous union and has pain with activities of daily living.  Past Medical History  Diagnosis Date  . Cancer     cervical  . Hypertension   . Stress incontinence   . Elevated cholesterol   . Superficial fungus infection of skin   . Panniculus   . Hot flashes 07/05/2012  . Rectocele 07/05/2012  . IFG (impaired fasting glucose)   . PONV (postoperative nausea and vomiting)     previously not recently  . Shortness of breath     exersional  . Asthma     Mild no rx  . Diabetes mellitus without complication     "borderline"  . Arthritis   . Obesity   . Mixed stress and urge urinary incontinence     Past Surgical History  Procedure Laterality Date  . Abdominal hysterectomy    . Knee arthroscopy Bilateral 208 lft, 2010 rt  . Lap bso loa    . Total knee arthroplasty Right 2013  . Hammer toes Bilateral   . Ankle fusion Left 10/24/2013    Procedure: LEFT ANKLE SUBTALAR AND TALONAVICULAR FUSION;  Surgeon: Newt Minion, MD;  Location: [place];  Service: Orthopedics;  Laterality: Left;  . Colonoscopy    . Back surgery  04    Lumbar 4- 5 Disectomy    Family History  Problem Relation Age of Onset  . Heart attack Father   . Stroke Mother   . Diabetes Brother   . Hypertension Brother   . Diabetes Brother   . Hypertension Brother   . Diabetes Maternal Grandmother   . Cancer Maternal Aunt     cervical   Social History:  reports that she quit smoking about 26 years ago. Her smoking use included Cigarettes. She has a 5 pack-year smoking history. She has never used smokeless tobacco. She reports that she does not drink alcohol or use illicit drugs.  Allergies:  Allergies  Allergen Reactions  . Naprosyn [Naproxen] Nausea Only    Can take Alleve but  years ago patient took a liquid form of this medication and it made me nauseated    No prescriptions prior to admission    No results found for this or any previous visit (from the past 48 hour(s)). No results found.  Review of Systems  All other systems reviewed and are negative.   There were no vitals taken for this visit. Physical Exam   On examination patient has pain to palpation of the talonavicular and subtalar joints. CT scan confirms fibrous union. 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  Plan plan for removal deep retained hardware plan for open reduction internal fixation with revision fusion. Risk and benefits were discussed patient states she understands wish to proceed at this time.  DUDA,MARCUS V 08/26/2014, 7:01 AM

## 2014-08-26 NOTE — Transfer of Care (Signed)
Immediate Anesthesia Transfer of Care Note  Patient: [name]  Procedure(s) Performed: Procedure(s): Left Foot Take Down Non-union with Revision Talonavicular and Subtalar Fusion (Left)  Patient Location: PACU  Anesthesia Type:General  Level of Consciousness: awake, alert  and oriented  Airway & Oxygen Therapy: Patient Spontanous Breathing and Patient connected to nasal cannula oxygen  Post-op Assessment: Report given to RN and Post -op Vital signs reviewed and stable  Post vital signs: Reviewed and stable  Last Vitals:  Filed Vitals:   08/26/14 1014  BP: 172/76  Pulse: 71  Temp: 36.3 C  Resp: 20    Complications: No apparent anesthesia complications

## 2014-08-26 NOTE — Anesthesia Postprocedure Evaluation (Signed)
Anesthesia Post Note  Patient: [name]  Procedure(s) Performed: Procedure(s) (LRB): Left Foot Take Down Non-union with Revision Talonavicular and Subtalar Fusion (Left)  Anesthesia type: General  Patient location: PACU  Post pain: Pain level controlled and Adequate analgesia  Post assessment: Post-op Vital signs reviewed, Patient's Cardiovascular Status Stable, Respiratory Function Stable, Patent Airway and Pain level controlled  Last Vitals:  Filed Vitals:   08/26/14 1014  BP: 172/76  Pulse: 71  Temp: 36.3 C  Resp: 20    Post vital signs: Reviewed and stable  Level of consciousness: awake, alert  and oriented  Complications: No apparent anesthesia complications

## 2014-08-26 NOTE — Progress Notes (Signed)
Orthopedic Tech Progress Note Patient Details:  [name] 10/31/1955 [id number] Delivered CAM walker to pt.'s nurse. Ortho Devices Type of Ortho Device: CAM walker Ortho Device/Splint Interventions: Other (comment)   Darrol Poke 08/26/2014, 2:51 PM

## 2014-08-27 ENCOUNTER — Encounter (HOSPITAL_COMMUNITY): Payer: Self-pay | Admitting: Orthopedic Surgery

## 2014-08-27 DIAGNOSIS — T8489XA Other specified complication of internal orthopedic prosthetic devices, implants and grafts, initial encounter: Secondary | ICD-10-CM | POA: Diagnosis not present

## 2014-08-27 NOTE — Evaluation (Signed)
Physical Therapy Evaluation Patient Details Name: [name] MRN: [id number] DOB: 11/16/1955 Today's Date: 08/27/2014   History of Present Illness  pt rpesents post revision of L ankle Fusion.    Clinical Impression  Pt moving well and indicates original surgery was October 24, 2013.  Pt indicates no concerns and no difficulties needing PT at this time.  Will sign off.      Follow Up Recommendations No PT follow up    Equipment Recommendations  None recommended by PT    Recommendations for Other Services       Precautions / Restrictions Precautions Precautions: None Restrictions Weight Bearing Restrictions: Yes LLE Weight Bearing: Non weight bearing      Mobility  Bed Mobility Overal bed mobility: Modified Independent                Transfers Overall transfer level: Modified independent Equipment used: Rolling walker (2 wheeled)                Ambulation/Gait Ambulation/Gait assistance: Modified independent (Device/Increase time) Ambulation Distance (Feet): 80 Feet Assistive device: Rolling walker (2 wheeled) Gait Pattern/deviations: Step-to pattern     General Gait Details: pt demonstrates good use of RW and awareness of safety.    Stairs            Wheelchair Mobility    Modified Rankin (Stroke Patients Only)       Balance Overall balance assessment: No apparent balance deficits (not formally assessed)                                           Pertinent Vitals/Pain Pain Assessment: No/denies pain    Home Living Family/patient expects to be discharged to:: Private residence Living Arrangements: Spouse/significant other Available Help at Discharge: Family;Available 24 hours/day Type of Home: House Home Access: Stairs to enter Entrance Stairs-Rails: Left Entrance Stairs-Number of Steps: 2 Home Layout: One level Home Equipment: Walker - 2 wheels;Bedside commode      Prior Function Level of Independence:  Independent               Hand Dominance        Extremity/Trunk Assessment   Upper Extremity Assessment: Overall WFL for tasks assessed           Lower Extremity Assessment: Overall WFL for tasks assessed      Cervical / Trunk Assessment: Normal  Communication   Communication: No difficulties  Cognition Arousal/Alertness: Awake/alert Behavior During Therapy: WFL for tasks assessed/performed Overall Cognitive Status: Within Functional Limits for tasks assessed                      General Comments      Exercises        Assessment/Plan    PT Assessment Patent does not need any further PT services  PT Diagnosis Difficulty walking   PT Problem List    PT Treatment Interventions     PT Goals (Current goals can be found in the Care Plan section) Acute Rehab PT Goals PT Goal Formulation: All assessment and education complete, DC therapy    Frequency     Barriers to discharge        Co-evaluation               End of Session Equipment Utilized During Treatment: Gait belt Activity Tolerance: Patient tolerated treatment well  Patient left: in bed;with call bell/phone within reach Nurse Communication: Mobility status    Functional Assessment Tool Used: Clinical Judgement Functional Limitation: Mobility: Walking and moving around Mobility: Walking and Moving Around Current Status [phone]): 0 percent impaired, limited or restricted Mobility: Walking and Moving Around Goal Status [phone]): 0 percent impaired, limited or restricted Mobility: Walking and Moving Around Discharge Status [phone]): 0 percent impaired, limited or restricted    Time: 1346-1359 PT Time Calculation (min) (ACUTE ONLY): 13 min   Charges:   PT Evaluation $Initial PT Evaluation Tier I: 1 Procedure     PT G Codes:   PT G-Codes **NOT FOR INPATIENT CLASS** Functional Assessment Tool Used: Clinical Judgement Functional Limitation: Mobility: Walking and moving  around Mobility: Walking and Moving Around Current Status (G6659): 0 percent impaired, limited or restricted Mobility: Walking and Moving Around Goal Status (D3570): 0 percent impaired, limited or restricted Mobility: Walking and Moving Around Discharge Status (V7793): 0 percent impaired, limited or restricted    Catarina Hartshorn, [place] 08/27/2014, 3:35 PM

## 2014-08-27 NOTE — Progress Notes (Signed)
[name] to be D/C'd Home per MD order.  Discussed with the patient and all questions fully answered.  VSS, Surgical dressing changed prior to d/c and is clean, dry, intact.  IV catheter discontinued intact. Site without signs and symptoms of complications. Dressing and pressure applied.  An After Visit Summary was printed and given to the patient. Patient received prescription.  D/c education completed with patient/family including follow up instructions, medication list, d/c activities limitations if indicated, with other d/c instructions as indicated by MD - patient able to verbalize understanding, all questions fully answered.   Patient instructed to return to ED, call 911, or call MD for any changes in condition.   Patient escorted via [place], and D/C home via private auto.  Donn Pierini Beckner 08/27/2014 12:01 PM

## 2014-08-27 NOTE — Progress Notes (Signed)
  Pharmacy Discharge Medication Therapy Review   Total Number of meds on admission _____7_____ (polypharmacy > 10 meds)  Indications for all medications: [x]  Yes       []  No  Adherence Review  []  Excellent (no doses missed/week)     [x]  Good (no more than 1 dose missed/week)     []  Partial (2-3 doses missed/week)     []  Poor (>3 doses missed/week)  Total number of high risk medications __0__ (Anticoagulants, Dual antiplatelets, oral Antihyperglycemic agents,Insulins, Antipsychotics, Anti-Seizure meds, Inhalers, HF/ACS meds, Antibiotics and HIV medications)   Assessment: (Medication related problems)  Intervention  YES NO  Explanation   Indications      Medication without noted indication []  [x]     Indication without noted medication []  [x]     Duplicate therapy [x]  []  Patient to start Aspirin 325 post op Subtalar and talonavicular fibrous union.  Duplicate therapy with home medication aspirin 81 mg daily.    Efficacy      Suboptimal drug or dose selection []  [x]    Insufficient dose/duration []  [x]    Failure to receive therapy  (Rx not filled) []  [x]     Safety      Adverse drug event []  [x]     Drug interaction []  [x]     Excessive dose/duration []  [x]    High-risk medications []  [x]    Compliance     Underuse []  [x]     Overuse []  [x]    Other pertinent pharmacist counseling []  [x]  Patient to follow up with Dr. Sharol Given in 2 weeks    Total number of new medications upon discharge: ____2_____  Time:  Time spent preparing for discharge counseling: 5 min. Time spent counseling patient: 0 Additional time spent on discharge (specify): 0  PLAN:  Consider the following at discharge/Recommendations discussed with provider - DC Aspirin 81 mg by mouth daily - Continue Aspirin EC 325 mg by mouth daily  Hassie Bruce, Pharm. D. Clinical Pharmacy Resident Pager: [phone] Ph: [phone] 08/27/2014 8:45 AM

## 2014-08-27 NOTE — Discharge Summary (Signed)
Physician Discharge Summary  Patient ID: [name] MRN: [id number] DOB/AGE: Jun 02, 1955 59 y.o.  Admit date: 08/26/2014 Discharge date: 08/27/2014  Admission Diagnoses: Subtalar and talonavicular fibrous union  Discharge Diagnoses:  Active Problems:   Fibrosis of subtalar joint   Discharged Condition: stable  Hospital Course: Patient's hospital course was essentially unremarkable. She underwent removal the deep retained hardware takedown of fibrous union of the subtalar and talonavicular joint with revision fusion. Postoperatively patient progressed well and was discharged to home in stable condition.  Consults: None  Significant Diagnostic Studies: labs: Routine labs  Treatments: surgery: See operative note  Discharge Exam: Blood pressure 131/61, pulse 79, temperature 97.9 F (36.6 C), temperature source Oral, resp. rate 17, height 5\' 3"  (1.6 m), weight 113.399 kg (250 lb), SpO2 100 %. Incision/Wound: dressing clean and dry  Disposition: 01-Home or Self Care  Discharge Instructions    Call MD / Call 911    Complete by:  As directed   If you experience chest pain or shortness of breath, CALL 911 and be transported to the hospital emergency room.  If you develope a fever above 101 F, pus (white drainage) or increased drainage or redness at the wound, or calf pain, call your surgeon's office.     Constipation Prevention    Complete by:  As directed   Drink plenty of fluids.  Prune juice may be helpful.  You may use a stool softener, such as Colace (over the counter) 100 mg twice a day.  Use MiraLax (over the counter) for constipation as needed.     Diet - low sodium heart healthy    Complete by:  As directed      Elevate operative extremity    Complete by:  As directed      Increase activity slowly as tolerated    Complete by:  As directed      Touch down weight bearing    Complete by:  As directed   Laterality:  left  Extremity:  Lower            Medication List     TAKE these medications        acetaminophen 500 MG tablet  Commonly known as:  TYLENOL  Take 1,000 mg by mouth every 6 (six) hours as needed for mild pain.     aspirin 81 MG tablet  Take 81 mg by mouth daily.     aspirin EC 325 MG tablet  Take 1 tablet (325 mg total) by mouth daily.     ibuprofen 200 MG tablet  Commonly known as:  ADVIL,MOTRIN  Take 400 mg by mouth every 6 (six) hours as needed for moderate pain.     lisinopril-hydrochlorothiazide 10-12.5 MG per tablet  Commonly known as:  PRINZIDE,ZESTORETIC  Take 1 tablet by mouth daily.     nystatin 100000 UNIT/GM Powd  Apply 1 g topically as needed (for itching/ rash).     oxyCODONE-acetaminophen 5-325 MG per tablet  Commonly known as:  ROXICET  Take 1 tablet by mouth every 4 (four) hours as needed for severe pain.     simvastatin 40 MG tablet  Commonly known as:  ZOCOR  TAKE 1 TABLET (40 MG TOTAL) BY MOUTH DAILY AT 6 PM.     Vitamin D3 3000 UNITS Tabs  Take 3,000 Units by mouth daily as needed (for vitamin).           Follow-up Information    Follow up with Newt Minion, MD In  2 weeks.   Specialty:  Orthopedic Surgery   Contact information:   [place] [address] [phone]       Signed: Newt Minion 08/27/2014, 5:44 AM

## 2014-09-21 ENCOUNTER — Other Ambulatory Visit: Payer: Self-pay

## 2014-10-30 ENCOUNTER — Other Ambulatory Visit: Payer: Self-pay | Admitting: Family Medicine

## 2014-10-30 DIAGNOSIS — Z1231 Encounter for screening mammogram for malignant neoplasm of breast: Secondary | ICD-10-CM

## 2014-11-03 ENCOUNTER — Other Ambulatory Visit: Payer: Self-pay | Admitting: Adult Health

## 2014-11-05 ENCOUNTER — Ambulatory Visit (HOSPITAL_COMMUNITY)
Admission: RE | Admit: 2014-11-05 | Discharge: 2014-11-05 | Disposition: A | Discharge status: Home / Self Care | Payer: 59 | Source: Ambulatory Visit | Attending: Family Medicine | Admitting: Family Medicine

## 2014-11-05 DIAGNOSIS — Z1231 Encounter for screening mammogram for malignant neoplasm of breast: Secondary | ICD-10-CM | POA: Diagnosis not present

## 2014-11-20 ENCOUNTER — Other Ambulatory Visit: Payer: Self-pay | Admitting: Family Medicine

## 2014-12-02 ENCOUNTER — Ambulatory Visit (HOSPITAL_COMMUNITY): Payer: 59 | Attending: Orthopedic Surgery | Admitting: Physical Therapy

## 2014-12-02 DIAGNOSIS — M25675 Stiffness of left foot, not elsewhere classified: Secondary | ICD-10-CM | POA: Diagnosis present

## 2014-12-02 DIAGNOSIS — R262 Difficulty in walking, not elsewhere classified: Secondary | ICD-10-CM | POA: Diagnosis present

## 2014-12-02 DIAGNOSIS — IMO0002 Reserved for concepts with insufficient information to code with codable children: Secondary | ICD-10-CM

## 2014-12-02 DIAGNOSIS — M2142 Flat foot [pes planus] (acquired), left foot: Secondary | ICD-10-CM | POA: Diagnosis present

## 2014-12-02 DIAGNOSIS — R269 Unspecified abnormalities of gait and mobility: Secondary | ICD-10-CM | POA: Diagnosis present

## 2014-12-02 DIAGNOSIS — M25672 Stiffness of left ankle, not elsewhere classified: Secondary | ICD-10-CM | POA: Insufficient documentation

## 2014-12-02 DIAGNOSIS — R29898 Other symptoms and signs involving the musculoskeletal system: Secondary | ICD-10-CM

## 2014-12-02 NOTE — Patient Instructions (Signed)
Plantar Fascia, Standing   Stand on stairs or curb. Lower heel. Hold _30__ seconds.  Repeat _3__ times per session. Do __2_ sessions per day.  Copyright  VHI. All rights reserved.  Calf Stretch   Stand with hands supported on wall, elbows slightly bent, front knee bent, back knee straight, feet parallel and both heels on floor. Lean into wall by pushing hips forward until a stretch is felt in calf muscle. Hold __30__ seconds, 3 times. Repeat with leg positions switched.  Copyright  VHI. All rights reserved.  Ankle Alphabet   Using left ankle and foot only, trace the letters of the alphabet. Perform A to Z. Repeat __2__ times per set. Do __1__ sets per session. Do _2___ sessions per day.  http://orth.exer.us/17   Copyright  VHI. All rights reserved.  Ankle Circles   Slowly rotate right foot and ankle clockwise then counterclockwise. Gradually increase range of motion. Avoid pain. Circle __15__ times each direction per set. Do __1__ sets per session. Do __2__ sessions per day.  http://orth.exer.us/31   Copyright  VHI. All rights reserved.  ANKLE: Inversion, Unilateral   Sit at edge of surface, feet on floor on a towel. Bring toes in toward body and back out. Do not move hip. _15__ reps per set, _1__ sets per day, _2__ times per day.   Copyright  VHI. All rights reserved.  Dorsiflexion: Resisted   Facing anchor, tubing around left foot, pull toward face.  Repeat __15__ times per set. Do _1___ sets per session. Do ___1_ sessions per day.  http://orth.exer.us/8   Copyright  VHI. All rights reserved.  Plantar Flexion: Resisted   Anchor behind, tubing around left foot, press down. Repeat _15___ times per set. Do __1__ sets per session. Do __1__ sessions per day.  http://orth.exer.us/10   Copyright  VHI. All rights reserved.  Inversion: Resisted   Cross legs with left leg underneath, foot in tubing loop. Hold tubing around other foot to resist and turn foot  in. Repeat __15__ times per set. Do ___1_ sets per session. Do _1___ sessions per day.  http://orth.exer.us/12   Copyright  VHI. All rights reserved.  Eversion: Resisted   With left foot in tubing loop, hold tubing around other foot to resist and turn foot out. Repeat __15__ times per set. Do __1__ sets per session. Do ___1_ sessions per day.  http://orth.exer.us/14   Copyright  VHI. All rights reserved.

## 2014-12-02 NOTE — Therapy (Signed)
[place], [place], [address] Phone: [phone]   Fax:  [phone]  Physical Therapy Evaluation  Patient Details  Name: [name] MRN: [id number] Date of Birth: 23-Jul-1955 Referring Provider:  Newt Minion, MD  Encounter Date: 12/02/2014      PT End of Session - 12/02/14 1108    Visit Number 1   Number of Visits 8   Date for PT Re-Evaluation 12/30/14   Authorization Type UHC   Authorization Time Period 12/02/14-01/27/15   Authorization - Visit Number 1   Authorization - Number of Visits 8   PT Start Time 0930   PT Stop Time 1017   PT Time Calculation (min) 47 min   Activity Tolerance Patient tolerated treatment well   Behavior During Therapy [facility] for tasks assessed/performed      Past Medical History  Diagnosis Date  . Hypertension   . Stress incontinence   . Elevated cholesterol   . Superficial fungus infection of skin   . Panniculus   . Hot flashes 07/05/2012  . Rectocele 07/05/2012  . IFG (impaired fasting glucose)   . Diabetes mellitus without complication     "borderline"  . Obesity   . Mixed stress and urge urinary incontinence   . PONV (postoperative nausea and vomiting)   . Family history of adverse reaction to anesthesia     "I think my mama gets sick"  . Asthmatic bronchitis     "haven't used an inhaler in years" (08/26/2014)  . Exertional shortness of breath   . Arthritis     "fingers occasionally" (08/26/2014)  . Cervical cancer     Past Surgical History  Procedure Laterality Date  . Knee arthroscopy Bilateral 2008-2010     left-right  . Bilateral salpingectomy  2010    w/LOA  . Total knee arthroplasty Right 2013  . Hammer toe surgery Bilateral 2000-2013    right-left  . Ankle fusion Left 10/24/2013    Procedure: LEFT ANKLE SUBTALAR AND TALONAVICULAR FUSION;  Surgeon: Newt Minion, MD;  Location: [place];  Service: Orthopedics;  Laterality: Left;  . Colonoscopy    . Fusion of  talonavicular joint Left     Take Down Non-union with Revision Talonavicular and Subtalar Fusion /notes 08/26/2014  . Joint replacement    . Back surgery    . Lumbar disc surgery  2004    Lumbar 4- 5  . Abdominal hysterectomy  1986    "partial"  . Ankle fusion Left 08/26/2014    Procedure: Left Foot Take Down Non-union with Revision Talonavicular and Subtalar Fusion;  Surgeon: Newt Minion, MD;  Location: [facility];  Service: Orthopedics;  Laterality: Left;    There were no vitals filed for this visit.  Visit Diagnosis:  Difficulty walking  Joint stiffness of left ankle and/or foot  Weakness of left foot  Abnormality of gait      Subjective Assessment - 12/02/14 0936    Subjective Pt underwent subtalar fusion on 08/26/14. She has been experiencing some tightness in the back of her ankle and some soreness in her foot/ankle when walking. Her biggest complaint is that the tightness is limiting her ability to walk without a limp.    How long can you sit comfortably? no limitations   How long can you stand comfortably? 5 minutes   How long can you walk comfortably? 10 minutes   Patient Stated Goals stretch out tightness in her heel, be able  to walk without a limp   Currently in Pain? Yes   Pain Score 1    Pain Location Foot   Pain Orientation Left            [facility] PT Assessment - 12/02/14 0001    Assessment   Medical Diagnosis L subtalar fusion   Onset Date/Surgical Date 08/26/14   Next MD Visit 12/24/14   Prior Therapy no   Balance Screen   Has the patient fallen in the past 6 months No   Has the patient had a decrease in activity level because of a fear of falling?  No   Is the patient reluctant to leave their home because of a fear of falling?  No   Home Environment   Living Environment Private residence   Living Arrangements Spouse/significant other   Type of [place] to enter   Entrance Stairs-Number of Steps 2   Entrance Stairs-Rails Right    Prior Function   Level of Independence Independent   Vocation --  plans to return to work at end of year   Vocation Requirements standing on feet all day, walking .25 miles at a time   Observation/Other Assessments   Focus on Therapeutic Outcomes (FOTO)  52% limited   Functional Tests   Functional tests Single leg stance   Single Leg Stance   Comments 3 seconds LLE   ROM / Strength   AROM / PROM / Strength AROM;PROM;Strength   AROM   AROM Assessment Site Ankle   Right/Left Ankle Left   Left Ankle Dorsiflexion 2   Left Ankle Plantar Flexion 20   Left Ankle Inversion 18   Left Ankle Eversion 15   PROM   PROM Assessment Site Ankle   Right/Left Ankle Left   Left Ankle Dorsiflexion 8   Left Ankle Plantar Flexion 25   Left Ankle Inversion 25   Left Ankle Eversion 20   Strength   Strength Assessment Site Ankle   Right/Left Ankle Left   Left Ankle Dorsiflexion 4+/5   Left Ankle Plantar Flexion 4-/5   Left Ankle Inversion 3+/5   Left Ankle Eversion 4-/5   Ambulation/Gait   Ambulation/Gait Yes   Ambulation/Gait Assistance 7: Independent   Ambulation Distance (Feet) 560 Feet   Gait Pattern Decreased step length - right;Decreased stance time - right;Decreased stride length;Decreased weight shift to left;Left foot flat   Gait Comments TUG 9.95 seconds   6 minute walk test results    Aerobic Endurance Distance Walked 560   Endurance additional comments 3 minute walk test                 PT Education - 12/02/14 1052    Education provided Yes   Education Details Prognosis, POC, HEP   Person(s) Educated Patient   Methods Explanation;Handout   Comprehension Verbalized understanding;Returned demonstration          PT Short Term Goals - 12/02/14 1123    PT SHORT TERM GOAL #1   Title Pt will be independent with HEP   Time 2   Period Weeks   Status New   PT SHORT TERM GOAL #2   Title Improve L ankle plantarflexion, inversion, and eversion strength to 4/5 to improve  gait mechanics.    Time 2   Period Weeks   Status New   PT SHORT TERM GOAL #3   Title Pt will maintain SLS x10 seconds on LLE to demonstrate improved balance.  Time 2   Period Weeks   Status New           PT Long Term Goals - 12/02/14 1125    PT LONG TERM GOAL #1   Title Pt will be independent with advanced HEP.    Time 4   Period Weeks   Status New   PT LONG TERM GOAL #2   Title Improve L ankle plantarflexion, inversion, and eversion to 4+/5 or greater to improve gait mechanics.    Time 4   Period Weeks   Status New   PT LONG TERM GOAL #3   Title Pt will maintain SLS x 15 seconds or greater to demonstrate improved balance.   Time 4   Period Weeks   Status New   PT LONG TERM GOAL #4   Title Pt will ambulate 700 feet or greater during 3 minute walk test to demonstrate improved gait speed and functional mobility.   Time 4   Period Weeks   Status New   PT LONG TERM GOAL #5   Title Pt will ambulate 1,000 feet with equal WB, equal step length, and <1/10 pain to allow her to walk community distances.   Time 4   Period Weeks   Status New               Plan - 12/02/14 1111    Clinical Impression Statement Pt presents to PT following a subtalar fusion, demonstrating decreased L ankle ROM and strength, impaired gait mechanics, decreased functional activity tolerance, and impaired balance. Pt will benefit from skilled physical therapy services to address these impairments in order to return pt to PLOF. Pt's high copay limits the amount of treatment sessions that she will be able to attend, so each session should focus on upgrading HEP and moving pt towards independence as quickly as possible. Pt's greatest limitation at this time is ROM of ankle and gait mechanics.     Pt will benefit from skilled therapeutic intervention in order to improve on the following deficits Abnormal gait;Decreased activity tolerance;Decreased balance;Decreased endurance;Decreased  mobility;Decreased range of motion;Decreased strength;Difficulty walking;Hypomobility;Pain   Rehab Potential Good   PT Frequency 2x / week   PT Duration 4 weeks   PT Treatment/Interventions ADLs/Self Care Home Management;Gait training;Stair training;Functional mobility training;Therapeutic activities;Therapeutic exercise;Balance training;Neuromuscular re-education;Manual techniques;Passive range of motion   PT Next Visit Plan Follow up with HEP, begin slantboard stretch, seated BAPS, SLS         Problem List Patient Active Problem List   Diagnosis Date Noted  . 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 Mixed stress and urge urinary incontinence 02/10/2014  . Diabetes 01/18/2014  . Posterior tibialis muscle dysfunction 10/24/2013  . Hyperlipidemia LDL goal <100 03/10/2013  . Superficial fungus infection of skin 07/05/2012  . Hot flashes 07/05/2012  . Rectocele 07/05/2012  . HTN (hypertension) 07/04/2012  . Hx of cervical cancer 07/04/2012  . Stiffness of joint, lower leg, left 08/28/2011  . Difficulty in walking(719.7) 08/28/2011  . Pain in left knee 08/28/2011    Hilma Favors, PT, DPT [phone] 12/02/2014, 11:30 AM  [place], [place], [address] Phone: [phone]   Fax:  [phone]

## 2014-12-08 ENCOUNTER — Other Ambulatory Visit: Payer: Self-pay

## 2014-12-08 MED ORDER — SIMVASTATIN 40 MG PO TABS: ORAL_TABLET | ORAL | Status: DC

## 2014-12-08 MED ORDER — LISINOPRIL-HYDROCHLOROTHIAZIDE 10-12.5 MG PO TABS: 1.0000 | ORAL_TABLET | Freq: Every day | ORAL | Status: DC

## 2014-12-09 ENCOUNTER — Ambulatory Visit (HOSPITAL_COMMUNITY): Payer: 59 | Admitting: Physical Therapy

## 2014-12-09 DIAGNOSIS — R262 Difficulty in walking, not elsewhere classified: Secondary | ICD-10-CM

## 2014-12-09 DIAGNOSIS — IMO0002 Reserved for concepts with insufficient information to code with codable children: Secondary | ICD-10-CM

## 2014-12-09 DIAGNOSIS — R269 Unspecified abnormalities of gait and mobility: Secondary | ICD-10-CM

## 2014-12-09 DIAGNOSIS — R29898 Other symptoms and signs involving the musculoskeletal system: Secondary | ICD-10-CM

## 2014-12-09 NOTE — Therapy (Signed)
[place], [place], [address] Phone: [phone]   Fax:  [phone]  Physical Therapy Treatment  Patient Details  Name: [name] MRN: [id number] Date of Birth: 1956/02/15 Referring Provider:  Newt Minion, MD  Encounter Date: 12/09/2014      PT End of Session - 12/09/14 1050    Visit Number 2   Number of Visits 8   Date for PT Re-Evaluation 12/30/14   Authorization Type UHC   Authorization Time Period 12/02/14-01/27/15   Authorization - Visit Number 2   Authorization - Number of Visits 8   PT Start Time [phone]   PT Stop Time 1025   PT Time Calculation (min) 47 min   Activity Tolerance Patient tolerated treatment well   Behavior During Therapy [facility] for tasks assessed/performed      Past Medical History  Diagnosis Date  . Hypertension   . Stress incontinence   . Elevated cholesterol   . Superficial fungus infection of skin   . Panniculus   . Hot flashes 07/05/2012  . Rectocele 07/05/2012  . IFG (impaired fasting glucose)   . Diabetes mellitus without complication     "borderline"  . Obesity   . Mixed stress and urge urinary incontinence   . PONV (postoperative nausea and vomiting)   . Family history of adverse reaction to anesthesia     "I think my mama gets sick"  . Asthmatic bronchitis     "haven't used an inhaler in years" (08/26/2014)  . Exertional shortness of breath   . Arthritis     "fingers occasionally" (08/26/2014)  . Cervical cancer     Past Surgical History  Procedure Laterality Date  . Knee arthroscopy Bilateral 2008-2010     left-right  . Bilateral salpingectomy  2010    w/LOA  . Total knee arthroplasty Right 2013  . Hammer toe surgery Bilateral 2000-2013    right-left  . Ankle fusion Left 10/24/2013    Procedure: LEFT ANKLE SUBTALAR AND TALONAVICULAR FUSION;  Surgeon: Newt Minion, MD;  Location: [facility];  Service: Orthopedics;  Laterality: Left;  . Colonoscopy    . Fusion of  talonavicular joint Left     Take Down Non-union with Revision Talonavicular and Subtalar Fusion /notes 08/26/2014  . Joint replacement    . Back surgery    . Lumbar disc surgery  2004    Lumbar 4- 5  . Abdominal hysterectomy  1986    "partial"  . Ankle fusion Left 08/26/2014    Procedure: Left Foot Take Down Non-union with Revision Talonavicular and Subtalar Fusion;  Surgeon: Newt Minion, MD;  Location: [facility];  Service: Orthopedics;  Laterality: Left;    There were no vitals filed for this visit.  Visit Diagnosis:  Difficulty walking  Joint stiffness of left ankle and/or foot  Weakness of left foot  Abnormality of gait      Subjective Assessment - 12/09/14 1049    Subjective Pt states she is tired of limping.  Reports complaince with HEP . Currently with some discomfort in her forefoot and swelling in her ankle.   Currently in Pain? Yes   Pain Score 1    Pain Location Foot   Pain Orientation Left                         [facility] Adult PT Treatment/Exercise - 12/09/14 1044    Manual Therapy   Manual  Therapy Joint mobilization;Soft tissue mobilization   Manual therapy comments Lt ankle and forefoot   Joint Mobilization to improve joint mobiltiy   Soft tissue mobilization to mobilze tissue and decrease edema   Ankle Exercises: Stretches   Plantar Fascia Stretch 3 reps;30 seconds   Slant Board Stretch 3 reps;30 seconds   Ankle Exercises: Standing   Vector Stance Left;5 reps;5 seconds   Vector Stance Limitations 1 HHA   SLS 1 minute trials with most of 3" on Lt LE   Ankle Exercises: Seated   BAPS Level 2;Sitting;10 reps                  PT Short Term Goals - 12/02/14 1123    PT SHORT TERM GOAL #1   Title Pt will be independent with HEP   Time 2   Period Weeks   Status New   PT SHORT TERM GOAL #2   Title Improve L ankle plantarflexion, inversion, and eversion strength to 4/5 to improve gait mechanics.    Time 2   Period Weeks   Status New    PT SHORT TERM GOAL #3   Title Pt will maintain SLS x10 seconds on LLE to demonstrate improved balance.    Time 2   Period Weeks   Status New           PT Long Term Goals - 12/02/14 1125    PT LONG TERM GOAL #1   Title Pt will be independent with advanced HEP.    Time 4   Period Weeks   Status New   PT LONG TERM GOAL #2   Title Improve L ankle plantarflexion, inversion, and eversion to 4+/5 or greater to improve gait mechanics.    Time 4   Period Weeks   Status New   PT LONG TERM GOAL #3   Title Pt will maintain SLS x 15 seconds or greater to demonstrate improved balance.   Time 4   Period Weeks   Status New   PT LONG TERM GOAL #4   Title Pt will ambulate 700 feet or greater during 3 minute walk test to demonstrate improved gait speed and functional mobility.   Time 4   Period Weeks   Status New   PT LONG TERM GOAL #5   Title Pt will ambulate 1,000 feet with equal WB, equal step length, and <1/10 pain to allow her to walk community distances.   Time 4   Period Weeks   Status New               Plan - 12/09/14 1050    Clinical Impression Statement Pt given copy of inital evaluation with explanation of measures and goals.  PRogressed with SLS and vector stance today.  Pt with poor ability to maintain balance on Lt LE and unable to maintain greater than 3 seconds without use of UE's.  Added vector stance to improve ankle stability and manual added to improve joint mobiltiy in forefoot and ankle.  Audible popping in forefoot with overall improvment at end of session with mobility and gait.     PT Next Visit Plan Progress ROM of ankle, stability and strength.  Continue with manual PRN.  Work on gait using longer stride and heel to toe gait.         Problem List Patient Active Problem List   Diagnosis Date Noted  . 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 Mixed stress and urge urinary incontinence 02/10/2014  .  Diabetes 01/18/2014  . Posterior tibialis muscle  dysfunction 10/24/2013  . Hyperlipidemia LDL goal <100 03/10/2013  . Superficial fungus infection of skin 07/05/2012  . Hot flashes 07/05/2012  . Rectocele 07/05/2012  . HTN (hypertension) 07/04/2012  . Hx of cervical cancer 07/04/2012  . Stiffness of joint, lower leg, left 08/28/2011  . Difficulty in walking(719.7) 08/28/2011  . Pain in left knee 08/28/2011    Teena Irani, PTA/CLT [phone]  12/09/2014, 10:55 AM  [place], [place], [address] Phone: [phone]   Fax:  [phone]

## 2014-12-11 ENCOUNTER — Ambulatory Visit (HOSPITAL_COMMUNITY): Payer: 59 | Admitting: Physical Therapy

## 2014-12-11 DIAGNOSIS — R269 Unspecified abnormalities of gait and mobility: Secondary | ICD-10-CM

## 2014-12-11 DIAGNOSIS — R262 Difficulty in walking, not elsewhere classified: Secondary | ICD-10-CM | POA: Diagnosis not present

## 2014-12-11 DIAGNOSIS — R29898 Other symptoms and signs involving the musculoskeletal system: Secondary | ICD-10-CM

## 2014-12-11 DIAGNOSIS — IMO0002 Reserved for concepts with insufficient information to code with codable children: Secondary | ICD-10-CM

## 2014-12-11 NOTE — Therapy (Signed)
[place], [place], [address] Phone: [phone]   Fax:  [phone]  Physical Therapy Treatment  Patient Details  Name: [name] MRN: [id number] Date of Birth: [birth year] Referring Provider:  Newt Minion, MD  Encounter Date: 12/11/2014      PT End of Session - 12/11/14 1015    Visit Number 3   Number of Visits 8   Authorization Type UHC   Authorization Time Period 12/02/14-01/27/15   Authorization - Visit Number 3   Authorization - Number of Visits 8   PT Start Time 7824   PT Stop Time 2353   PT Time Calculation (min) 39 min   Activity Tolerance Patient tolerated treatment well      Past Medical History  Diagnosis Date  . Hypertension   . Stress incontinence   . Elevated cholesterol   . Superficial fungus infection of skin   . Panniculus   . Hot flashes 07/05/2012  . Rectocele 07/05/2012  . IFG (impaired fasting glucose)   . Diabetes mellitus without complication     "borderline"  . Obesity   . Mixed stress and urge urinary incontinence   . PONV (postoperative nausea and vomiting)   . Family history of adverse reaction to anesthesia     "I think my mama gets sick"  . Asthmatic bronchitis     "haven't used an inhaler in years" (08/26/2014)  . Exertional shortness of breath   . Arthritis     "fingers occasionally" (08/26/2014)  . Cervical cancer     Past Surgical History  Procedure Laterality Date  . Knee arthroscopy Bilateral 2008-2010     left-right  . Bilateral salpingectomy  2010    w/LOA  . Total knee arthroplasty Right 2013  . Hammer toe surgery Bilateral 2000-2013    right-left  . Ankle fusion Left 10/24/2013    Procedure: LEFT ANKLE SUBTALAR AND TALONAVICULAR FUSION;  Surgeon: Newt Minion, MD;  Location: [facility];  Service: Orthopedics;  Laterality: Left;  . Colonoscopy    . Fusion of talonavicular joint Left     Take Down Non-union with Revision Talonavicular and Subtalar Fusion  /notes 08/26/2014  . Joint replacement    . Back surgery    . Lumbar disc surgery  2004    Lumbar 4- 5  . Abdominal hysterectomy  1986    "partial"  . Ankle fusion Left 08/26/2014    Procedure: Left Foot Take Down Non-union with Revision Talonavicular and Subtalar Fusion;  Surgeon: Newt Minion, MD;  Location: [facility];  Service: Orthopedics;  Laterality: Left;    There were no vitals filed for this visit.  Visit Diagnosis:  Difficulty walking  Joint stiffness of left ankle and/or foot  Weakness of left foot  Abnormality of gait      Subjective Assessment - 12/11/14 0934    Subjective Pt states that she continues to be sore.  She is painting at home stretching her leg on her step ladder.     Currently in Pain? Yes   Pain Score 6                 [facility] Adult PT Treatment/Exercise - 12/11/14 0001    Ambulation/Gait   Pre-Gait Activities Lt heel down x 5 seconds/flat x 5 seconds/toe off x5 seconds  (slow gait along counter to encourage heel toe gt)   Manual Therapy   Manual Therapy Edema management;Joint mobilization   Manual therapy  comments Lt ankle    Joint Mobilization to improve joint mobiltiy   Soft tissue mobilization to decrease edema.    Ankle Exercises: Standing   Vector Stance Left;10 seconds   Rocker Board 2 minutes   Rebounder anterior/posterior.    Ankle Exercises: Stretches   Plantar Fascia Stretch 3 reps;30 seconds   Slant Board Stretch 3 reps;30 seconds                PT Education - 12/11/14 1015    Education provided Yes   Education Details gait exercise   Person(s) Educated Patient   Methods Demonstration;Explanation   Comprehension Verbalized understanding;Returned demonstration          PT Short Term Goals - 12/02/14 1123    PT SHORT TERM GOAL #1   Title Pt will be independent with HEP   Time 2   Period Weeks   Status New   PT SHORT TERM GOAL #2   Title Improve L ankle plantarflexion, inversion, and eversion strength to 4/5  to improve gait mechanics.    Time 2   Period Weeks   Status New   PT SHORT TERM GOAL #3   Title Pt will maintain SLS x10 seconds on LLE to demonstrate improved balance.    Time 2   Period Weeks   Status New           PT Long Term Goals - 12/02/14 1125    PT LONG TERM GOAL #1   Title Pt will be independent with advanced HEP.    Time 4   Period Weeks   Status New   PT LONG TERM GOAL #2   Title Improve L ankle plantarflexion, inversion, and eversion to 4+/5 or greater to improve gait mechanics.    Time 4   Period Weeks   Status New   PT LONG TERM GOAL #3   Title Pt will maintain SLS x 15 seconds or greater to demonstrate improved balance.   Time 4   Period Weeks   Status New   PT LONG TERM GOAL #4   Title Pt will ambulate 700 feet or greater during 3 minute walk test to demonstrate improved gait speed and functional mobility.   Time 4   Period Weeks   Status New   PT LONG TERM GOAL #5   Title Pt will ambulate 1,000 feet with equal WB, equal step length, and <1/10 pain to allow her to walk community distances.   Time 4   Period Weeks   Status New               Plan - 12/11/14 1016    Clinical Impression Statement Pt increased vector stance to 10 seconds; with baps done in standing.  Pt had difficulty with standing Baps but was able to master with verbal and manual cuing.  Manual done at end of session with pt stating that she had no pain.    PT Next Visit Plan Progress ROM of ankle, stability and strength.  Continue with manual PRN.  Work on gait using longer stride and heel to toe gait.         Problem List Patient Active Problem List   Diagnosis Date Noted  . 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 Mixed stress and urge urinary incontinence 02/10/2014  . Diabetes 01/18/2014  . Posterior tibialis muscle dysfunction 10/24/2013  . Hyperlipidemia LDL goal <100 03/10/2013  . Superficial fungus infection of skin 07/05/2012  . Hot flashes 07/05/2012  .  Rectocele 07/05/2012  . HTN (hypertension) 07/04/2012  . Hx of cervical cancer 07/04/2012  . Stiffness of joint, lower leg, left 08/28/2011  . Difficulty in walking(719.7) 08/28/2011  . Pain in left knee 08/28/2011    Rayetta Humphrey, PT CLT [phone] 12/11/2014, 10:18 AM  [employer], [place], [address] Phone: [phone]   Fax:  [phone]

## 2014-12-15 ENCOUNTER — Ambulatory Visit (HOSPITAL_COMMUNITY): Payer: 59 | Admitting: Physical Therapy

## 2014-12-17 ENCOUNTER — Ambulatory Visit (HOSPITAL_COMMUNITY): Payer: 59 | Admitting: Physical Therapy

## 2014-12-17 DIAGNOSIS — IMO0002 Reserved for concepts with insufficient information to code with codable children: Secondary | ICD-10-CM

## 2014-12-17 DIAGNOSIS — R262 Difficulty in walking, not elsewhere classified: Secondary | ICD-10-CM | POA: Diagnosis not present

## 2014-12-17 DIAGNOSIS — R269 Unspecified abnormalities of gait and mobility: Secondary | ICD-10-CM

## 2014-12-17 DIAGNOSIS — R29898 Other symptoms and signs involving the musculoskeletal system: Secondary | ICD-10-CM

## 2014-12-17 NOTE — Therapy (Signed)
[place], [place], [address] Phone: [phone]   Fax:  [phone]  Physical Therapy Treatment  Patient Details  Name: [name] MRN: [id number] Date of Birth: Jan 16, 1956 Referring Provider:  Mikey Kirschner, MD  Encounter Date: 12/17/2014      PT End of Session - 12/17/14 1357    Visit Number 4   Number of Visits 8   Authorization Type U[employer]   Authorization Time Period 12/02/14-01/27/15   Authorization - Visit Number 4   Authorization - Number of Visits 8   PT Start Time 3818   PT Stop Time 1350   PT Time Calculation (min) 46 min   Activity Tolerance Patient tolerated treatment well   Behavior During Therapy [facility] for tasks assessed/performed      Past Medical History  Diagnosis Date  . Hypertension   . Stress incontinence   . Elevated cholesterol   . Superficial fungus infection of skin   . Panniculus   . Hot flashes 07/05/2012  . Rectocele 07/05/2012  . IFG (impaired fasting glucose)   . Diabetes mellitus without complication     "borderline"  . Obesity   . Mixed stress and urge urinary incontinence   . PONV (postoperative nausea and vomiting)   . Family history of adverse reaction to anesthesia     "I think my mama gets sick"  . Asthmatic bronchitis     "haven't used an inhaler in years" (08/26/2014)  . Exertional shortness of breath   . Arthritis     "fingers occasionally" (08/26/2014)  . Cervical cancer     Past Surgical History  Procedure Laterality Date  . Knee arthroscopy Bilateral 2008-2010     left-right  . Bilateral salpingectomy  2010    w/LOA  . Total knee arthroplasty Right 2013  . Hammer toe surgery Bilateral 2000-2013    right-left  . Ankle fusion Left 10/24/2013    Procedure: LEFT ANKLE SUBTALAR AND TALONAVICULAR FUSION;  Surgeon: Newt Minion, MD;  Location: [place];  Service: Orthopedics;  Laterality: Left;  . Colonoscopy    . Fusion of talonavicular joint Left     Take  Down Non-union with Revision Talonavicular and Subtalar Fusion /notes 08/26/2014  . Joint replacement    . Back surgery    . Lumbar disc surgery  2004    Lumbar 4- 5  . Abdominal hysterectomy  1986    "partial"  . Ankle fusion Left 08/26/2014    Procedure: Left Foot Take Down Non-union with Revision Talonavicular and Subtalar Fusion;  Surgeon: Newt Minion, MD;  Location: [place];  Service: Orthopedics;  Laterality: Left;    There were no vitals filed for this visit.  Visit Diagnosis:  Difficulty walking  Joint stiffness of left ankle and/or foot  Weakness of left foot  Abnormality of gait      Subjective Assessment - 12/17/14 1406    Subjective Pt states she is doing well and trying to walk slower and concentrate on heel to toe gait.  STates when she's up alot her Rt hip starts hurting.   Currently in Pain? Yes   Pain Score 2    Pain Location Foot   Pain Orientation Left                         [facility] Adult PT Treatment/Exercise - 12/17/14 1307    Ambulation/Gait   Pre-Gait Activities heel to toe  gait   Manual Therapy   Manual Therapy Edema management;Joint mobilization   Manual therapy comments Lt ankle    Joint Mobilization to improve joint mobiltiy   Soft tissue mobilization to decrease edema.    Ankle Exercises: Stretches   Plantar Fascia Stretch 3 reps;30 seconds   Slant Board Stretch 3 reps;30 seconds   Ankle Exercises: Standing   BAPS Limitations  add next session   Vector Stance Left;5 seconds   Vector Stance Limitations 1 HHA 10 reps   SLS 1 minute trials with most of 3" on Lt LE   Rocker Board 2 minutes;Limitations  A/P and Rt/Lt 1 finger assist   Rebounder --                  PT Short Term Goals - 12/02/14 1123    PT SHORT TERM GOAL #1   Title Pt will be independent with HEP   Time 2   Period Weeks   Status New   PT SHORT TERM GOAL #2   Title Improve L ankle plantarflexion, inversion, and eversion strength to 4/5 to improve  gait mechanics.    Time 2   Period Weeks   Status New   PT SHORT TERM GOAL #3   Title Pt will maintain SLS x10 seconds on LLE to demonstrate improved balance.    Time 2   Period Weeks   Status New           PT Long Term Goals - 12/02/14 1125    PT LONG TERM GOAL #1   Title Pt will be independent with advanced HEP.    Time 4   Period Weeks   Status New   PT LONG TERM GOAL #2   Title Improve L ankle plantarflexion, inversion, and eversion to 4+/5 or greater to improve gait mechanics.    Time 4   Period Weeks   Status New   PT LONG TERM GOAL #3   Title Pt will maintain SLS x 15 seconds or greater to demonstrate improved balance.   Time 4   Period Weeks   Status New   PT LONG TERM GOAL #4   Title Pt will ambulate 700 feet or greater during 3 minute walk test to demonstrate improved gait speed and functional mobility.   Time 4   Period Weeks   Status New   PT LONG TERM GOAL #5   Title Pt will ambulate 1,000 feet with equal WB, equal step length, and <1/10 pain to allow her to walk community distances.   Time 4   Period Weeks   Status New               Plan - 12/17/14 1357    Clinical Impression Statement Increased to 10 reps fo vector stance today and able to maintain SLS better than first trial.  Pt able to ambulate without antalgia when decreases gait speed and concentrates on heel to toe gait.  Pt reports compliance with HEP and ambulation.  Rotation of ankle remains most diffiucult for patient.     PT Next Visit Plan Progress ROM of ankle, stability and strength.  Continue with manual PRN.  Add HR/TR and standing BAPS next visit and begin dynamic balance tasks (tandem, retro, sidestepping).          Problem List Patient Active Problem List   Diagnosis Date Noted  . 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 Mixed stress and urge urinary incontinence 02/10/2014  . Diabetes 01/18/2014  .  Posterior tibialis muscle dysfunction 10/24/2013  . Hyperlipidemia  LDL goal <100 03/10/2013  . Superficial fungus infection of skin 07/05/2012  . Hot flashes 07/05/2012  . Rectocele 07/05/2012  . HTN (hypertension) 07/04/2012  . Hx of cervical cancer 07/04/2012  . Stiffness of joint, lower leg, left 08/28/2011  . Difficulty in walking(719.7) 08/28/2011  . Pain in left knee 08/28/2011    Teena Irani, PTA/CLT [id number]  12/17/2014, 2:07 PM  [facility] [address] [place], [place], [address] Phone: [phone]   Fax:  [phone]

## 2014-12-23 ENCOUNTER — Ambulatory Visit (HOSPITAL_COMMUNITY): Payer: 59 | Admitting: Physical Therapy

## 2014-12-23 DIAGNOSIS — R262 Difficulty in walking, not elsewhere classified: Secondary | ICD-10-CM | POA: Diagnosis not present

## 2014-12-23 DIAGNOSIS — R269 Unspecified abnormalities of gait and mobility: Secondary | ICD-10-CM

## 2014-12-23 DIAGNOSIS — R29898 Other symptoms and signs involving the musculoskeletal system: Secondary | ICD-10-CM

## 2014-12-23 DIAGNOSIS — IMO0002 Reserved for concepts with insufficient information to code with codable children: Secondary | ICD-10-CM

## 2014-12-23 NOTE — Therapy (Signed)
[name], [place], [address] Phone: [phone]   Fax:  [phone]  Physical Therapy Treatment  Patient Details  Name: [name] MRN: [id number] Date of Birth: 1956-02-17 Referring Provider:  Newt Minion, MD  Encounter Date: 12/23/2014      PT End of Session - 12/23/14 1417    Visit Number 5   Number of Visits 8   Authorization Type UHC   Authorization Time Period 12/02/14-01/27/15   Authorization - Visit Number 5   Authorization - Number of Visits 8   PT Start Time 6195   PT Stop Time 1108   PT Time Calculation (min) 33 min   Activity Tolerance Patient tolerated treatment well   Behavior During Therapy [facility] for tasks assessed/performed      Past Medical History  Diagnosis Date  . Hypertension   . Stress incontinence   . Elevated cholesterol   . Superficial fungus infection of skin   . Panniculus   . Hot flashes 07/05/2012  . Rectocele 07/05/2012  . IFG (impaired fasting glucose)   . Diabetes mellitus without complication     "borderline"  . Obesity   . Mixed stress and urge urinary incontinence   . PONV (postoperative nausea and vomiting)   . Family history of adverse reaction to anesthesia     "I think my mama gets sick"  . Asthmatic bronchitis     "haven't used an inhaler in years" (08/26/2014)  . Exertional shortness of breath   . Arthritis     "fingers occasionally" (08/26/2014)  . Cervical cancer     Past Surgical History  Procedure Laterality Date  . Knee arthroscopy Bilateral 2008-2010     left-right  . Bilateral salpingectomy  2010    w/LOA  . Total knee arthroplasty Right 2013  . Hammer toe surgery Bilateral 2000-2013    right-left  . Ankle fusion Left 10/24/2013    Procedure: LEFT ANKLE SUBTALAR AND TALONAVICULAR FUSION;  Surgeon: Newt Minion, MD;  Location: [place];  Service: Orthopedics;  Laterality: Left;  . Colonoscopy    . Fusion of talonavicular joint Left     Take Down  Non-union with Revision Talonavicular and Subtalar Fusion /notes 08/26/2014  . Joint replacement    . Back surgery    . Lumbar disc surgery  2004    Lumbar 4- 5  . Abdominal hysterectomy  1986    "partial"  . Ankle fusion Left 08/26/2014    Procedure: Left Foot Take Down Non-union with Revision Talonavicular and Subtalar Fusion;  Surgeon: Newt Minion, MD;  Location: [place];  Service: Orthopedics;  Laterality: Left;    There were no vitals filed for this visit.  Visit Diagnosis:  Difficulty walking  Joint stiffness of left ankle and/or foot  Weakness of left foot  Abnormality of gait      Subjective Assessment - 12/23/14 1414    Subjective Pt reports overall improvement and concentrating on her improved gait.  Currently with ankle discomfort, 2/10   Currently in Pain? Yes   Pain Score 2    Pain Location Ankle   Pain Orientation Left                         [facility] Adult PT Treatment/Exercise - 12/23/14 1038    Ambulation/Gait   Pre-Gait Activities heel to toe gait   Manual Therapy   Manual Therapy Edema management;Joint mobilization  Manual therapy comments Lt ankle    Joint Mobilization to improve joint mobiltiy   Soft tissue mobilization to decrease edema.    Ankle Exercises: Stretches   Plantar Fascia Stretch 3 reps;30 seconds   Slant Board Stretch 3 reps;30 seconds   Ankle Exercises: Standing   BAPS Standing;Level 1;10 reps   Other Standing Ankle Exercises ankle excursions 10 reps each for Lt                  PT Short Term Goals - 12/02/14 1123    PT SHORT TERM GOAL #1   Title Pt will be independent with HEP   Time 2   Period Weeks   Status New   PT SHORT TERM GOAL #2   Title Improve L ankle plantarflexion, inversion, and eversion strength to 4/5 to improve gait mechanics.    Time 2   Period Weeks   Status New   PT SHORT TERM GOAL #3   Title Pt will maintain SLS x10 seconds on LLE to demonstrate improved balance.    Time 2    Period Weeks   Status New           PT Long Term Goals - 12/02/14 1125    PT LONG TERM GOAL #1   Title Pt will be independent with advanced HEP.    Time 4   Period Weeks   Status New   PT LONG TERM GOAL #2   Title Improve L ankle plantarflexion, inversion, and eversion to 4+/5 or greater to improve gait mechanics.    Time 4   Period Weeks   Status New   PT LONG TERM GOAL #3   Title Pt will maintain SLS x 15 seconds or greater to demonstrate improved balance.   Time 4   Period Weeks   Status New   PT LONG TERM GOAL #4   Title Pt will ambulate 700 feet or greater during 3 minute walk test to demonstrate improved gait speed and functional mobility.   Time 4   Period Weeks   Status New   PT LONG TERM GOAL #5   Title Pt will ambulate 1,000 feet with equal WB, equal step length, and <1/10 pain to allow her to walk community distances.   Time 4   Period Weeks   Status New               Plan - 12/23/14 1417    Clinical Impression Statement Pt late for appointment so unable to complete full program.  Pt reports overall improvement with slowing down her gait speed to improve antalgia.  Progressed to BAPS in standing with most difficulty with rotational movment/control.  Added ankle excursions today to help improve mobilityi.  Continued with manual therapy to help decrease pain and improve ROM.     PT Next Visit Plan Progress ROM of ankle, stability and strength.  Continue with manual PRN.  Add HR/TR and begin dynamic balance tasks (tandem, retro, sidestepping).          Problem List Patient Active Problem List   Diagnosis Date Noted  . 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 Mixed stress and urge urinary incontinence 02/10/2014  . Diabetes 01/18/2014  . Posterior tibialis muscle dysfunction 10/24/2013  . Hyperlipidemia LDL goal <100 03/10/2013  . Superficial fungus infection of skin 07/05/2012  . Hot flashes 07/05/2012  . Rectocele 07/05/2012  . HTN  (hypertension) 07/04/2012  . Hx of cervical cancer 07/04/2012  . Stiffness of  joint, lower leg, left 08/28/2011  . Difficulty in walking(719.7) 08/28/2011  . Pain in left knee 08/28/2011    [name], PTA/CLT [id number] 12/23/2014, 2:31 PM  [facility] [address] [place], [place], [address] Phone: [phone]   Fax:  [phone]

## 2014-12-25 ENCOUNTER — Ambulatory Visit (HOSPITAL_COMMUNITY): Payer: 59 | Admitting: Physical Therapy

## 2014-12-25 DIAGNOSIS — IMO0002 Reserved for concepts with insufficient information to code with codable children: Secondary | ICD-10-CM

## 2014-12-25 DIAGNOSIS — R262 Difficulty in walking, not elsewhere classified: Secondary | ICD-10-CM | POA: Diagnosis not present

## 2014-12-25 DIAGNOSIS — R269 Unspecified abnormalities of gait and mobility: Secondary | ICD-10-CM

## 2014-12-25 DIAGNOSIS — R29898 Other symptoms and signs involving the musculoskeletal system: Secondary | ICD-10-CM

## 2014-12-25 NOTE — Therapy (Signed)
[place], [place], [address] Phone: [phone]   Fax:  [phone]  Physical Therapy Treatment  Patient Details  Name: [name] MRN: [id number] Date of Birth: Mar 08, 1956 Referring Provider:  Newt Minion, MD  Encounter Date: 12/25/2014      PT End of Session - 12/25/14 1205    Visit Number 6   Number of Visits 8   Date for PT Re-Evaluation 12/30/14   Authorization Type UHC   Authorization Time Period 12/02/14-01/27/15   Authorization - Visit Number 6   Authorization - Number of Visits 8   PT Start Time 0931   PT Stop Time 1015   PT Time Calculation (min) 44 min   Activity Tolerance Patient tolerated treatment well   Behavior During Therapy [facility] for tasks assessed/performed      Past Medical History  Diagnosis Date  . Hypertension   . Stress incontinence   . Elevated cholesterol   . Superficial fungus infection of skin   . Panniculus   . Hot flashes 07/05/2012  . Rectocele 07/05/2012  . IFG (impaired fasting glucose)   . Diabetes mellitus without complication     "borderline"  . Obesity   . Mixed stress and urge urinary incontinence   . PONV (postoperative nausea and vomiting)   . Family history of adverse reaction to anesthesia     "I think my mama gets sick"  . Asthmatic bronchitis     "haven't used an inhaler in years" (08/26/2014)  . Exertional shortness of breath   . Arthritis     "fingers occasionally" (08/26/2014)  . Cervical cancer     Past Surgical History  Procedure Laterality Date  . Knee arthroscopy Bilateral 2008-2010     left-right  . Bilateral salpingectomy  2010    w/LOA  . Total knee arthroplasty Right 2013  . Hammer toe surgery Bilateral 2000-2013    right-left  . Ankle fusion Left 10/24/2013    Procedure: LEFT ANKLE SUBTALAR AND TALONAVICULAR FUSION;  Surgeon: Newt Minion, MD;  Location: [place];  Service: Orthopedics;  Laterality: Left;  . Colonoscopy    . Fusion of  talonavicular joint Left     Take Down Non-union with Revision Talonavicular and Subtalar Fusion /notes 08/26/2014  . Joint replacement    . Back surgery    . Lumbar disc surgery  2004    Lumbar 4- 5  . Abdominal hysterectomy  1986    "partial"  . Ankle fusion Left 08/26/2014    Procedure: Left Foot Take Down Non-union with Revision Talonavicular and Subtalar Fusion;  Surgeon: Newt Minion, MD;  Location: [place];  Service: Orthopedics;  Laterality: Left;    There were no vitals filed for this visit.  Visit Diagnosis:  Difficulty walking  Joint stiffness of left ankle and/or foot  Weakness of left foot  Abnormality of gait      Subjective Assessment - 12/25/14 0939    Subjective Pt reports that she has some stiffness and soreness in her ankle today.    Currently in Pain? Yes   Pain Score 2    Pain Location Ankle   Pain Orientation Left                [facility] Adult PT Treatment/Exercise - 12/25/14 0001    Manual Therapy   Manual Therapy Joint mobilization;Soft tissue mobilization   Manual therapy comments Lt ankle    Joint Mobilization to improve joint  mobiltiy   Soft tissue mobilization to L achilles   Ankle Exercises: Seated   Towel Crunch --  2 minutes   Towel Inversion/Eversion 1 rep  1 minute   Ankle Exercises: Stretches   Slant Board Stretch 3 reps;30 seconds   Ankle Exercises: Standing   BAPS Standing;Level 2;10 reps   Rocker Board 2 minutes;Limitations  A/P and Rt/Lt 1 finger assist   Heel Raises 15 reps   Heel Walk (Round Trip) 2                  PT Short Term Goals - 12/02/14 1123    PT SHORT TERM GOAL #1   Title Pt will be independent with HEP   Time 2   Period Weeks   Status New   PT SHORT TERM GOAL #2   Title Improve L ankle plantarflexion, inversion, and eversion strength to 4/5 to improve gait mechanics.    Time 2   Period Weeks   Status New   PT SHORT TERM GOAL #3   Title Pt will maintain SLS x10 seconds on LLE to demonstrate  improved balance.    Time 2   Period Weeks   Status New           PT Long Term Goals - 12/02/14 1125    PT LONG TERM GOAL #1   Title Pt will be independent with advanced HEP.    Time 4   Period Weeks   Status New   PT LONG TERM GOAL #2   Title Improve L ankle plantarflexion, inversion, and eversion to 4+/5 or greater to improve gait mechanics.    Time 4   Period Weeks   Status New   PT LONG TERM GOAL #3   Title Pt will maintain SLS x 15 seconds or greater to demonstrate improved balance.   Time 4   Period Weeks   Status New   PT LONG TERM GOAL #4   Title Pt will ambulate 700 feet or greater during 3 minute walk test to demonstrate improved gait speed and functional mobility.   Time 4   Period Weeks   Status New   PT LONG TERM GOAL #5   Title Pt will ambulate 1,000 feet with equal WB, equal step length, and <1/10 pain to allow her to walk community distances.   Time 4   Period Weeks   Status New               Plan - 12/25/14 1205    Clinical Impression Statement Pt continues to demonstrate difficulty with BAPS board and with ambulating with heel toe pattern. Heel and toe walking were added today, pt was unable to complete toe walking d/t reports of pain. Pt will benefit from continued ROM activities and strength training.    PT Next Visit Plan Reassess next session        Problem List Patient Active Problem List   Diagnosis Date Noted  . 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 Mixed stress and urge urinary incontinence 02/10/2014  . Diabetes 01/18/2014  . Posterior tibialis muscle dysfunction 10/24/2013  . Hyperlipidemia LDL goal <100 03/10/2013  . Superficial fungus infection of skin 07/05/2012  . Hot flashes 07/05/2012  . Rectocele 07/05/2012  . HTN (hypertension) 07/04/2012  . Hx of cervical cancer 07/04/2012  . Stiffness of joint, lower leg, left 08/28/2011  . Difficulty in walking(719.7) 08/28/2011  . Pain in left knee 08/28/2011    Hilma Favors, PT,  DPT [phone] 12/25/2014, 12:09 PM  [facility] [place] [address] [place], [place], [address] Phone: [phone]   Fax:  [phone]

## 2014-12-28 LAB — HM DIABETES EYE EXAM

## 2014-12-29 ENCOUNTER — Ambulatory Visit (HOSPITAL_COMMUNITY): Payer: 59 | Attending: Orthopedic Surgery | Admitting: Physical Therapy

## 2014-12-29 DIAGNOSIS — R269 Unspecified abnormalities of gait and mobility: Secondary | ICD-10-CM | POA: Diagnosis present

## 2014-12-29 DIAGNOSIS — M25672 Stiffness of left ankle, not elsewhere classified: Secondary | ICD-10-CM | POA: Diagnosis present

## 2014-12-29 DIAGNOSIS — M2142 Flat foot [pes planus] (acquired), left foot: Secondary | ICD-10-CM | POA: Diagnosis present

## 2014-12-29 DIAGNOSIS — R262 Difficulty in walking, not elsewhere classified: Secondary | ICD-10-CM

## 2014-12-29 DIAGNOSIS — M25675 Stiffness of left foot, not elsewhere classified: Secondary | ICD-10-CM | POA: Diagnosis present

## 2014-12-29 DIAGNOSIS — R29898 Other symptoms and signs involving the musculoskeletal system: Secondary | ICD-10-CM

## 2014-12-29 DIAGNOSIS — IMO0002 Reserved for concepts with insufficient information to code with codable children: Secondary | ICD-10-CM

## 2014-12-29 NOTE — Therapy (Signed)
[facility] [address] [place], [place], [address] Phone: [phone]   Fax:  [phone]  Physical Therapy Treatment (Reassessment)  Patient Details  Name: [name] MRN: [id number] Date of Birth: February 28, 1956 Referring Provider:  Newt Minion, MD  Encounter Date: 12/29/2014      PT End of Session - 12/29/14 1224    Visit Number 7   Number of Visits 12   Date for PT Re-Evaluation 01/26/15   Authorization Type UHC   Authorization Time Period 12/02/14-01/27/15   PT Start Time 1029   PT Stop Time 1100   PT Time Calculation (min) 31 min   Activity Tolerance Patient tolerated treatment well   Behavior During Therapy [facility] for tasks assessed/performed      Past Medical History  Diagnosis Date  . Hypertension   . Stress incontinence   . Elevated cholesterol   . Superficial fungus infection of skin   . Panniculus   . Hot flashes 07/05/2012  . Rectocele 07/05/2012  . IFG (impaired fasting glucose)   . Diabetes mellitus without complication     "borderline"  . Obesity   . Mixed stress and urge urinary incontinence   . PONV (postoperative nausea and vomiting)   . Family history of adverse reaction to anesthesia     "I think my mama gets sick"  . Asthmatic bronchitis     "haven't used an inhaler in years" (08/26/2014)  . Exertional shortness of breath   . Arthritis     "fingers occasionally" (08/26/2014)  . Cervical cancer     Past Surgical History  Procedure Laterality Date  . Knee arthroscopy Bilateral 2008-2010     left-right  . Bilateral salpingectomy  2010    w/LOA  . Total knee arthroplasty Right 2013  . Hammer toe surgery Bilateral 2000-2013    right-left  . Ankle fusion Left 10/24/2013    Procedure: LEFT ANKLE SUBTALAR AND TALONAVICULAR FUSION;  Surgeon: Newt Minion, MD;  Location: [facility];  Service: Orthopedics;  Laterality: Left;  . Colonoscopy    . Fusion of talonavicular joint Left     Take Down Non-union with  Revision Talonavicular and Subtalar Fusion /notes 08/26/2014  . Joint replacement    . Back surgery    . Lumbar disc surgery  2004    Lumbar 4- 5  . Abdominal hysterectomy  1986    "partial"  . Ankle fusion Left 08/26/2014    Procedure: Left Foot Take Down Non-union with Revision Talonavicular and Subtalar Fusion;  Surgeon: Newt Minion, MD;  Location: [facility];  Service: Orthopedics;  Laterality: Left;    There were no vitals filed for this visit.  Visit Diagnosis:  Difficulty walking  Joint stiffness of left ankle and/or foot  Weakness of left foot  Abnormality of gait      Subjective Assessment - 12/29/14 1032    Subjective Pt reports that her ankle feels pretty sore today. She has been trying to keep moving to decrease the soreness in her foot and ankle. Pt reports that she has been able to move her foot more without pain, has been walking more, and  has had decreased pain levels. She still has difficulty with moving her foot in circular motion, is having difficulty walking without a limp.    How long can you sit comfortably? no limitations   How long can you stand comfortably? 15 minutes   How long can you walk comfortably? 10 minutes  Currently in Pain? Yes   Pain Score 3    Pain Location Ankle  ankle and foot   Pain Orientation Left            [facility] PT Assessment - 12/29/14 0001    Observation/Other Assessments   Focus on Therapeutic Outcomes (FOTO)  45% limited   Functional Tests   Functional tests Single leg stance   Single Leg Stance   Comments 6 seconds   AROM   AROM Assessment Site Ankle   Right/Left Ankle Left   Left Ankle Dorsiflexion 6   Left Ankle Plantar Flexion 32   Left Ankle Inversion 18   Left Ankle Eversion 20   PROM   PROM Assessment Site Ankle   Right/Left Ankle Left   Left Ankle Dorsiflexion 10   Left Ankle Plantar Flexion 38   Left Ankle Inversion 24   Left Ankle Eversion 24   Strength   Strength Assessment Site Ankle   Right/Left  Ankle Left   Left Ankle Dorsiflexion 4+/5   Left Ankle Plantar Flexion 4/5   Left Ankle Inversion 4-/5   Left Ankle Eversion 4/5   Ambulation/Gait   Gait Comments TUG: 7.32   6 minute walk test results    Aerobic Endurance Distance Walked 520   Endurance additional comments 3 minute walk test                     [facility] Adult PT Treatment/Exercise - 12/29/14 0001    Ankle Exercises: Seated   ABC's 1 rep   Ankle Exercises: Standing   BAPS Standing;Level 2;10 reps                  PT Short Term Goals - 12/29/14 1307    PT SHORT TERM GOAL #1   Title Pt will be independent with HEP   Time 2   Period Weeks   Status Achieved   PT SHORT TERM GOAL #2   Title Improve L ankle plantarflexion, inversion, and eversion strength to 4/5 to improve gait mechanics.    Time 2   Period Weeks   Status On-going   PT SHORT TERM GOAL #3   Title Pt will maintain SLS x10 seconds on LLE to demonstrate improved balance.    Time 2   Period Weeks   Status On-going           PT Long Term Goals - 12/29/14 1307    PT LONG TERM GOAL #1   Title Pt will be independent with advanced HEP.    Time 4   Period Weeks   Status On-going   PT LONG TERM GOAL #2   Title Improve L ankle plantarflexion, inversion, and eversion to 4+/5 or greater to improve gait mechanics.    Time 4   Period Weeks   Status On-going   PT LONG TERM GOAL #3   Title Pt will maintain SLS x 15 seconds or greater to demonstrate improved balance.   Time 4   Period Weeks   Status On-going   PT LONG TERM GOAL #4   Title Pt will ambulate 700 feet or greater during 3 minute walk test to demonstrate improved gait speed and functional mobility.   Time 4   Period Weeks   Status On-going   PT LONG TERM GOAL #5   Title Pt will ambulate 1,000 feet with equal WB, equal step length, and <1/10 pain to allow her to walk community distances.   Time 4  Period Weeks   Status On-going               Plan -  12/29/14 1225    Clinical Impression Statement Pt 14 minutes late to treatment today. Reassessment was completed. Pt demonstrates improvements in ankle ROM, ankle strength, TUG time, and functional activity tolerance. Pt continues to demonstrate limited mobility of ankle joint, difficulty with walking with proper gait mechanics, decreased gait speed, and decreased endurance. Pt's main limitation at this time is her limited mobility, which is affecting her walking. She will benefit from continued skilled physical therapy to continue to address her impairments in order to return pt to optimal level of function.     PT Treatment/Interventions ADLs/Self Care Home Management;Gait training;Stair training;Functional mobility training;Therapeutic activities;Therapeutic exercise;Balance training;Neuromuscular re-education;Manual techniques;Passive range of motion   PT Next Visit Plan Continue with BAPS board, rocker board, gait training        Problem List Patient Active Problem List   Diagnosis Date Noted  . 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 Mixed stress and urge urinary incontinence 02/10/2014  . Diabetes ([place]) 01/18/2014  . Posterior tibialis muscle dysfunction 10/24/2013  . Hyperlipidemia LDL goal <100 03/10/2013  . Superficial fungus infection of skin 07/05/2012  . Hot flashes 07/05/2012  . Rectocele 07/05/2012  . HTN (hypertension) 07/04/2012  . Hx of cervical cancer 07/04/2012  . Stiffness of joint, lower leg, left 08/28/2011  . Difficulty in walking(719.7) 08/28/2011  . Pain in left knee 08/28/2011    Hilma Favors, PT, DPT [phone] 12/29/2014, 1:09 PM  [facility] [address] [place], [place], [address] Phone: [phone]   Fax:  [phone]

## 2014-12-30 ENCOUNTER — Ambulatory Visit (INDEPENDENT_AMBULATORY_CARE_PROVIDER_SITE_OTHER): Payer: 59 | Admitting: Adult Health

## 2014-12-30 ENCOUNTER — Encounter: Payer: Self-pay | Admitting: Adult Health

## 2014-12-30 VITALS — BP 144/70 | HR 68 | Ht 63.0 in | Wt 250.5 lb

## 2014-12-30 DIAGNOSIS — R4589 Other symptoms and signs involving emotional state: Secondary | ICD-10-CM

## 2014-12-30 DIAGNOSIS — F489 Nonpsychotic mental disorder, unspecified: Secondary | ICD-10-CM | POA: Diagnosis not present

## 2014-12-30 DIAGNOSIS — N951 Menopausal and female climacteric states: Secondary | ICD-10-CM | POA: Diagnosis not present

## 2014-12-30 DIAGNOSIS — Z79890 Hormone replacement therapy (postmenopausal): Secondary | ICD-10-CM | POA: Diagnosis not present

## 2014-12-30 DIAGNOSIS — Z7189 Other specified counseling: Secondary | ICD-10-CM | POA: Insufficient documentation

## 2014-12-30 DIAGNOSIS — R232 Flushing: Secondary | ICD-10-CM

## 2014-12-30 HISTORY — DX: Other symptoms and signs involving emotional state: R45.89

## 2014-12-30 HISTORY — DX: Other specified counseling: Z71.89

## 2014-12-30 MED ORDER — PAROXETINE MESYLATE 7.5 MG PO CAPS: ORAL_CAPSULE | ORAL | Status: DC

## 2014-12-30 NOTE — Patient Instructions (Signed)
Take brisdelle Follow up in 4 weeks

## 2014-12-30 NOTE — Progress Notes (Signed)
Subjective:     Patient ID: [name], female   DOB: 11/18/1955, 59 y.o.   MRN: [id number]  HPI [name] is a 59 year old white female, married in complaining of hot flashes and moodiness.She has used patches and gels in the past and even OTC meds, but has stopped it all, not sure why.  Review of Systems Patient denies any headaches, hearing loss, fatigue, blurred vision, shortness of breath, chest pain, abdominal pain, problems with bowel movements, urination, or intercourse(not having sex). No joint pain.See HPI for positives. Reviewed past medical,surgical, social and family history. Reviewed medications and allergies.     Objective:   Physical Exam BP 144/70 mmHg  Pulse 68  Ht 5\' 3"  (1.6 m)  Wt 250 lb 8 oz (113.626 kg)  BMI 44.39 kg/m2 Skin warm and dry. Lungs: clear to ausculation bilaterally. Cardiovascular: regular rate and rhythm.   Discussed ET vs brisdelle and the risk benefits and she wants to try brisdelle first. Face time 10 minutes with 50 % counseling.  Assessment:     Hot flashes  Moody  ET counseling     Plan:     Rx brisdelle 7.5 mg #30 take 1 daily with 6 refills,discount card given Follow up in 4 weeks

## 2014-12-31 ENCOUNTER — Ambulatory Visit (HOSPITAL_COMMUNITY): Payer: 59 | Admitting: Physical Therapy

## 2014-12-31 DIAGNOSIS — IMO0002 Reserved for concepts with insufficient information to code with codable children: Secondary | ICD-10-CM

## 2014-12-31 DIAGNOSIS — R29898 Other symptoms and signs involving the musculoskeletal system: Secondary | ICD-10-CM

## 2014-12-31 DIAGNOSIS — R269 Unspecified abnormalities of gait and mobility: Secondary | ICD-10-CM

## 2014-12-31 DIAGNOSIS — R262 Difficulty in walking, not elsewhere classified: Secondary | ICD-10-CM

## 2014-12-31 NOTE — Therapy (Signed)
[facility] [place], [place], [address] Phone: [phone]   Fax:  [phone]  Physical Therapy Treatment  Patient Details  Name: [name] MRN: [id number] Date of Birth: [birth year] Referring Provider:  Newt Minion, MD  Encounter Date: 12/31/2014      PT End of Session - 12/31/14 1151    Visit Number 8   Number of Visits 12   Date for PT Re-Evaluation 01/26/15   Authorization Type UHC   Authorization Time Period 12/02/14-01/27/15   PT Start Time 1105   PT Stop Time 1145   PT Time Calculation (min) 40 min   Activity Tolerance Patient tolerated treatment well   Behavior During Therapy [facility] for tasks assessed/performed      Past Medical History  Diagnosis Date  . Hypertension   . Stress incontinence   . Elevated cholesterol   . Superficial fungus infection of skin   . Panniculus   . Hot flashes 07/05/2012  . Rectocele 07/05/2012  . IFG (impaired fasting glucose)   . Diabetes mellitus without complication ([place])     "borderline"  . Obesity   . Mixed stress and urge urinary incontinence   . PONV (postoperative nausea and vomiting)   . Family history of adverse reaction to anesthesia     "I think my mama gets sick"  . Asthmatic bronchitis     "haven't used an inhaler in years" (08/26/2014)  . Exertional shortness of breath   . Arthritis     "fingers occasionally" (08/26/2014)  . Cervical cancer ([place])   . Moody 12/30/2014  . Counseling for estrogen replacement therapy 12/30/2014    Past Surgical History  Procedure Laterality Date  . Knee arthroscopy Bilateral 2008-2010     left-right  . Bilateral salpingectomy  2010    w/LOA  . Total knee arthroplasty Right 2013  . Hammer toe surgery Bilateral 2000-2013    right-left  . Ankle fusion Left 10/24/2013    Procedure: LEFT ANKLE SUBTALAR AND TALONAVICULAR FUSION;  Surgeon: Newt Minion, MD;  Location: [facility];  Service: Orthopedics;  Laterality: Left;  . Colonoscopy     . Fusion of talonavicular joint Left     Take Down Non-union with Revision Talonavicular and Subtalar Fusion /notes 08/26/2014  . Joint replacement    . Back surgery    . Lumbar disc surgery  2004    Lumbar 4- 5  . Abdominal hysterectomy  1986    "partial"  . Ankle fusion Left 08/26/2014    Procedure: Left Foot Take Down Non-union with Revision Talonavicular and Subtalar Fusion;  Surgeon: Newt Minion, MD;  Location: [facility];  Service: Orthopedics;  Laterality: Left;    There were no vitals filed for this visit.  Visit Diagnosis:  Difficulty walking  Joint stiffness of left ankle and/or foot  Weakness of left foot  Abnormality of gait      Subjective Assessment - 12/31/14 1150    Subjective PT states the ball of her foot and ankle are hurting today.  Noted antalgic gait as well.    Currently in Pain? Yes   Pain Score 3    Pain Location Foot   Pain Orientation Left   Pain Descriptors / Indicators Discomfort                         [facility] Adult PT Treatment/Exercise - 12/31/14 1109    Manual Therapy   Manual  Therapy Joint mobilization;Soft tissue mobilization   Manual therapy comments Lt ankle and plantar fascia   Joint Mobilization to improve joint mobiltiy   Soft tissue mobilization to L achilles   Ankle Exercises: Stretches   Plantar Fascia Stretch 3 reps;30 seconds   Slant Board Stretch 3 reps;30 seconds   Ankle Exercises: Standing   BAPS Standing;Level 2;10 reps   SLS 1 minute trials with most of 3" on Lt LE   Rocker Board 2 minutes;Limitations   Heel Raises 15 reps   Heel Walk (Round Trip) 1 RT                  PT Short Term Goals - 12/29/14 1307    PT SHORT TERM GOAL #1   Title Pt will be independent with HEP   Time 2   Period Weeks   Status Achieved   PT SHORT TERM GOAL #2   Title Improve L ankle plantarflexion, inversion, and eversion strength to 4/5 to improve gait mechanics.    Time 2   Period Weeks   Status On-going   PT  SHORT TERM GOAL #3   Title Pt will maintain SLS x10 seconds on LLE to demonstrate improved balance.    Time 2   Period Weeks   Status On-going           PT Long Term Goals - 12/29/14 1307    PT LONG TERM GOAL #1   Title Pt will be independent with advanced HEP.    Time 4   Period Weeks   Status On-going   PT LONG TERM GOAL #2   Title Improve L ankle plantarflexion, inversion, and eversion to 4+/5 or greater to improve gait mechanics.    Time 4   Period Weeks   Status On-going   PT LONG TERM GOAL #3   Title Pt will maintain SLS x 15 seconds or greater to demonstrate improved balance.   Time 4   Period Weeks   Status On-going   PT LONG TERM GOAL #4   Title Pt will ambulate 700 feet or greater during 3 minute walk test to demonstrate improved gait speed and functional mobility.   Time 4   Period Weeks   Status On-going   PT LONG TERM GOAL #5   Title Pt will ambulate 1,000 feet with equal WB, equal step length, and <1/10 pain to allow her to walk community distances.   Time 4   Period Weeks   Status On-going               Plan - 12/31/14 1158    Clinical Impression Statement Attempted eccentric heelraise lowering with LT only and toe walking, however too painful and difficult for patient.  Pt completed all other exericses today with most diffiuclty completing BAPS.  Pt with noted discomfort in the plantar aspect of foot spanning from heel to ball of foot.  Manual completed to Lt foot, ankle and calf musculature today with overall relief noted.  Pt reported she intends on going to get her some new tennis shoes today.    PT Next Visit Plan Continue to progress functional strength and decrease pain.         Problem List Patient Active Problem List   Diagnosis Date Noted  . Moody 12/30/2014  . Counseling for estrogen replacement therapy 12/30/2014  . 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 Mixed stress and urge urinary incontinence 02/10/2014  . Diabetes ([place])  01/18/2014  . Posterior  tibialis muscle dysfunction 10/24/2013  . Hyperlipidemia LDL goal <100 03/10/2013  . Superficial fungus infection of skin 07/05/2012  . Hot flashes 07/05/2012  . Rectocele 07/05/2012  . HTN (hypertension) 07/04/2012  . Hx of cervical cancer 07/04/2012  . Stiffness of joint, lower leg, left 08/28/2011  . Difficulty in walking(719.7) 08/28/2011  . Pain in left knee 08/28/2011    Teena Irani, PTA/CLT [phone]  12/31/2014, 12:01 PM  [facility] [address] [place], [place], [address] Phone: [phone]   Fax:  [phone]

## 2015-01-04 ENCOUNTER — Telehealth (HOSPITAL_COMMUNITY): Payer: Self-pay

## 2015-01-04 NOTE — Telephone Encounter (Signed)
Patient moved her appt to 01-06-15

## 2015-01-05 ENCOUNTER — Telehealth: Payer: Self-pay | Admitting: Family Medicine

## 2015-01-05 ENCOUNTER — Ambulatory Visit (HOSPITAL_COMMUNITY): Payer: 59 | Admitting: Physical Therapy

## 2015-01-05 DIAGNOSIS — R262 Difficulty in walking, not elsewhere classified: Secondary | ICD-10-CM

## 2015-01-05 DIAGNOSIS — R29898 Other symptoms and signs involving the musculoskeletal system: Secondary | ICD-10-CM

## 2015-01-05 DIAGNOSIS — E785 Hyperlipidemia, unspecified: Secondary | ICD-10-CM

## 2015-01-05 DIAGNOSIS — IMO0002 Reserved for concepts with insufficient information to code with codable children: Secondary | ICD-10-CM

## 2015-01-05 DIAGNOSIS — Z79899 Other long term (current) drug therapy: Secondary | ICD-10-CM

## 2015-01-05 DIAGNOSIS — E119 Type 2 diabetes mellitus without complications: Secondary | ICD-10-CM

## 2015-01-05 NOTE — Telephone Encounter (Signed)
Pt needs bw orders, aware of [facility]  Last labs  12/30/13 Lip, Hep, BMP, A1C  Other labs ordered by other docs since then

## 2015-01-05 NOTE — Telephone Encounter (Signed)
same

## 2015-01-05 NOTE — Telephone Encounter (Signed)
Notified patient bloodwork has been ordered.  

## 2015-01-05 NOTE — Therapy (Signed)
[name], [place], [address] Phone: [phone]   Fax:  [phone]  Physical Therapy Treatment  Patient Details  Name: [name] MRN: [id number] Date of Birth: 06-24-1955 Referring Provider:  Newt Minion, MD  Encounter Date: 01/05/2015      PT End of Session - 01/05/15 1105    Visit Number 9   Number of Visits 12   Date for PT Re-Evaluation 01/26/15   Authorization Type UHC   Authorization Time Period 12/02/14-01/27/15   PT Start Time 1025   PT Stop Time 1105   PT Time Calculation (min) 40 min   Activity Tolerance Patient tolerated treatment well   Behavior During Therapy [name] for tasks assessed/performed      Past Medical History  Diagnosis Date  . Hypertension   . Stress incontinence   . Elevated cholesterol   . Superficial fungus infection of skin   . Panniculus   . Hot flashes 07/05/2012  . Rectocele 07/05/2012  . IFG (impaired fasting glucose)   . Diabetes mellitus without complication ([place])     "borderline"  . Obesity   . Mixed stress and urge urinary incontinence   . PONV (postoperative nausea and vomiting)   . Family history of adverse reaction to anesthesia     "I think my mama gets sick"  . Asthmatic bronchitis     "haven't used an inhaler in years" (08/26/2014)  . Exertional shortness of breath   . Arthritis     "fingers occasionally" (08/26/2014)  . Cervical cancer ([place])   . Moody 12/30/2014  . Counseling for estrogen replacement therapy 12/30/2014    Past Surgical History  Procedure Laterality Date  . Knee arthroscopy Bilateral 2008-2010     left-right  . Bilateral salpingectomy  2010    w/LOA  . Total knee arthroplasty Right 2013  . Hammer toe surgery Bilateral 2000-2013    right-left  . Ankle fusion Left 10/24/2013    Procedure: LEFT ANKLE SUBTALAR AND TALONAVICULAR FUSION;  Surgeon: Newt Minion, MD;  Location: [name];  Service: Orthopedics;  Laterality: Left;  . Colonoscopy     . Fusion of talonavicular joint Left     Take Down Non-union with Revision Talonavicular and Subtalar Fusion /notes 08/26/2014  . Joint replacement    . Back surgery    . Lumbar disc surgery  2004    Lumbar 4- 5  . Abdominal hysterectomy  1986    "partial"  . Ankle fusion Left 08/26/2014    Procedure: Left Foot Take Down Non-union with Revision Talonavicular and Subtalar Fusion;  Surgeon: Newt Minion, MD;  Location: [name];  Service: Orthopedics;  Laterality: Left;    There were no vitals filed for this visit.  Visit Diagnosis:  Difficulty walking  Joint stiffness of left ankle and/or foot  Weakness of left foot      Subjective Assessment - 01/05/15 1111    Subjective PT 10 min late for appt.  STates she is having minimal pain in her Lt foot.  Pt wearing new tennis shoes and reports comfort.    Currently in Pain? Yes   Pain Score 2    Pain Location Foot   Pain Orientation Left                         [facility] Adult PT Treatment/Exercise - 01/05/15 1046    Manual Therapy   Manual Therapy Joint mobilization;Soft  tissue mobilization   Manual therapy comments Lt ankle and plantar fascia   Joint Mobilization to improve joint mobiltiy   Soft tissue mobilization to L achilles   Ankle Exercises: Stretches   Plantar Fascia Stretch 3 reps;30 seconds   Slant Board Stretch 3 reps;30 seconds   Ankle Exercises: Standing   BAPS Standing;Level 2;10 reps   Rocker Board 2 minutes;Limitations   Heel Raises 15 reps   Heel Walk (Round Trip) 1 RT                  PT Short Term Goals - 12/29/14 1307    PT SHORT TERM GOAL #1   Title Pt will be independent with HEP   Time 2   Period Weeks   Status Achieved   PT SHORT TERM GOAL #2   Title Improve L ankle plantarflexion, inversion, and eversion strength to 4/5 to improve gait mechanics.    Time 2   Period Weeks   Status On-going   PT SHORT TERM GOAL #3   Title Pt will maintain SLS x10 seconds on LLE to  demonstrate improved balance.    Time 2   Period Weeks   Status On-going           PT Long Term Goals - 12/29/14 1307    PT LONG TERM GOAL #1   Title Pt will be independent with advanced HEP.    Time 4   Period Weeks   Status On-going   PT LONG TERM GOAL #2   Title Improve L ankle plantarflexion, inversion, and eversion to 4+/5 or greater to improve gait mechanics.    Time 4   Period Weeks   Status On-going   PT LONG TERM GOAL #3   Title Pt will maintain SLS x 15 seconds or greater to demonstrate improved balance.   Time 4   Period Weeks   Status On-going   PT LONG TERM GOAL #4   Title Pt will ambulate 700 feet or greater during 3 minute walk test to demonstrate improved gait speed and functional mobility.   Time 4   Period Weeks   Status On-going   PT LONG TERM GOAL #5   Title Pt will ambulate 1,000 feet with equal WB, equal step length, and <1/10 pain to allow her to walk community distances.   Time 4   Period Weeks   Status On-going               Plan - 01/05/15 1106    Clinical Impression Statement Pt wearing new shoes today [name] and states they are very comfortable.  Continued difficulty with eccentric heelrasie lowering or toewalking.  PT ablet o complete all other exercises without diffiuclty or c/o pain. Overall improving mobility in foot with manual and increased awarness of gait corrections and reduced antaligia.       PT Next Visit Plan Continue to progress functional strength and decrease pain.         Problem List Patient Active Problem List   Diagnosis Date Noted  . Moody 12/30/2014  . Counseling for estrogen replacement therapy 12/30/2014  . 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 Mixed stress and urge urinary incontinence 02/10/2014  . Diabetes ([place]) 01/18/2014  . Posterior tibialis muscle dysfunction 10/24/2013  . Hyperlipidemia LDL goal <100 03/10/2013  . Superficial fungus infection of skin 07/05/2012  . Hot flashes  07/05/2012  . Rectocele 07/05/2012  . HTN (hypertension) 07/04/2012  . Hx of cervical cancer  07/04/2012  . Stiffness of joint, lower leg, left 08/28/2011  . Difficulty in walking(719.7) 08/28/2011  . Pain in left knee 08/28/2011    Teena Irani, PTA/CLT [phone]  01/05/2015, 11:13 AM  [name], [place], [address] Phone: [phone]   Fax:  [phone]

## 2015-01-06 ENCOUNTER — Ambulatory Visit (HOSPITAL_COMMUNITY): Payer: 59 | Admitting: Physical Therapy

## 2015-01-06 DIAGNOSIS — R269 Unspecified abnormalities of gait and mobility: Secondary | ICD-10-CM

## 2015-01-06 DIAGNOSIS — R29898 Other symptoms and signs involving the musculoskeletal system: Secondary | ICD-10-CM

## 2015-01-06 DIAGNOSIS — R262 Difficulty in walking, not elsewhere classified: Secondary | ICD-10-CM | POA: Diagnosis not present

## 2015-01-06 DIAGNOSIS — IMO0002 Reserved for concepts with insufficient information to code with codable children: Secondary | ICD-10-CM

## 2015-01-06 NOTE — Therapy (Signed)
[place], [place], [address] Phone: [phone]   Fax:  [phone]  Physical Therapy Treatment  Patient Details  Name: [name] MRN: [id number] Date of Birth: December 10, 1955 Referring Provider:  Newt Minion, MD  Encounter Date: 01/06/2015      PT End of Session - 01/06/15 1654    Visit Number 10   Number of Visits 12   Date for PT Re-Evaluation 01/26/15   Authorization Type UHC   Authorization Time Period 12/02/14-01/27/15   PT Start Time 1304   PT Stop Time 1348   PT Time Calculation (min) 44 min   Activity Tolerance Patient tolerated treatment well   Behavior During Therapy [facility] for tasks assessed/performed      Past Medical History  Diagnosis Date  . Hypertension   . Stress incontinence   . Elevated cholesterol   . Superficial fungus infection of skin   . Panniculus   . Hot flashes 07/05/2012  . Rectocele 07/05/2012  . IFG (impaired fasting glucose)   . Diabetes mellitus without complication ([place])     "borderline"  . Obesity   . Mixed stress and urge urinary incontinence   . PONV (postoperative nausea and vomiting)   . Family history of adverse reaction to anesthesia     "I think my mama gets sick"  . Asthmatic bronchitis     "haven't used an inhaler in years" (08/26/2014)  . Exertional shortness of breath   . Arthritis     "fingers occasionally" (08/26/2014)  . Cervical cancer ([place])   . Moody 12/30/2014  . Counseling for estrogen replacement therapy 12/30/2014    Past Surgical History  Procedure Laterality Date  . Knee arthroscopy Bilateral 2008-2010     left-right  . Bilateral salpingectomy  2010    w/LOA  . Total knee arthroplasty Right 2013  . Hammer toe surgery Bilateral 2000-2013    right-left  . Ankle fusion Left 10/24/2013    Procedure: LEFT ANKLE SUBTALAR AND TALONAVICULAR FUSION;  Surgeon: Newt Minion, MD;  Location: [facility];  Service: Orthopedics;  Laterality: Left;  .  Colonoscopy    . Fusion of talonavicular joint Left     Take Down Non-union with Revision Talonavicular and Subtalar Fusion /notes 08/26/2014  . Joint replacement    . Back surgery    . Lumbar disc surgery  2004    Lumbar 4- 5  . Abdominal hysterectomy  1986    "partial"  . Ankle fusion Left 08/26/2014    Procedure: Left Foot Take Down Non-union with Revision Talonavicular and Subtalar Fusion;  Surgeon: Newt Minion, MD;  Location: [facility];  Service: Orthopedics;  Laterality: Left;    There were no vitals filed for this visit.  Visit Diagnosis:  Difficulty walking  Joint stiffness of left ankle and/or foot  Weakness of left foot  Abnormality of gait      Subjective Assessment - 01/06/15 1657    Subjective Pt states she has most stiffness/pain after prolonged immobility.  Pt admits to being noncompliant with HEP.    Currently in Pain? Yes   Pain Score 2    Pain Location Foot   Pain Orientation Left   Pain Descriptors / Indicators Discomfort                         [facility] Adult PT Treatment/Exercise - 01/06/15 0001    Manual Therapy   Manual Therapy  Joint mobilization;Soft tissue mobilization   Manual therapy comments Lt ankle and plantar fascia   Joint Mobilization to improve joint mobiltiy   Soft tissue mobilization to L achilles   Ankle Exercises: Stretches   Plantar Fascia Stretch 3 reps;30 seconds   Slant Board Stretch 3 reps;30 seconds   Ankle Exercises: Standing   BAPS Standing;Level 2;10 reps   Rocker Board 2 minutes;Limitations  A/P and Rt/Lt   Heel Raises 15 reps   Heel Walk (Round Trip) 1 RT                  PT Short Term Goals - 12/29/14 1307    PT SHORT TERM GOAL #1   Title Pt will be independent with HEP   Time 2   Period Weeks   Status Achieved   PT SHORT TERM GOAL #2   Title Improve L ankle plantarflexion, inversion, and eversion strength to 4/5 to improve gait mechanics.    Time 2   Period Weeks   Status On-going   PT  SHORT TERM GOAL #3   Title Pt will maintain SLS x10 seconds on LLE to demonstrate improved balance.    Time 2   Period Weeks   Status On-going           PT Long Term Goals - 12/29/14 1307    PT LONG TERM GOAL #1   Title Pt will be independent with advanced HEP.    Time 4   Period Weeks   Status On-going   PT LONG TERM GOAL #2   Title Improve L ankle plantarflexion, inversion, and eversion to 4+/5 or greater to improve gait mechanics.    Time 4   Period Weeks   Status On-going   PT LONG TERM GOAL #3   Title Pt will maintain SLS x 15 seconds or greater to demonstrate improved balance.   Time 4   Period Weeks   Status On-going   PT LONG TERM GOAL #4   Title Pt will ambulate 700 feet or greater during 3 minute walk test to demonstrate improved gait speed and functional mobility.   Time 4   Period Weeks   Status On-going   PT LONG TERM GOAL #5   Title Pt will ambulate 1,000 feet with equal WB, equal step length, and <1/10 pain to allow her to walk community distances.   Time 4   Period Weeks   Status On-going               Plan - 01/06/15 1655    Clinical Impression Statement Continued reported comfort with shoes.  Continued with established therex with improved control with BAPS actvitiy today.  Instructed with theraband exericses as well today and encouraged patient to be more diligent with HEP in order to get better results.     PT Next Visit Plan Continue to progress functional strength and decrease pain. Add tandem balance, SLS And vector stance next session.        Problem List Patient Active Problem List   Diagnosis Date Noted  . Moody 12/30/2014  . Counseling for estrogen replacement therapy 12/30/2014  . 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 Mixed stress and urge urinary incontinence 02/10/2014  . Diabetes ([place]) 01/18/2014  . Posterior tibialis muscle dysfunction 10/24/2013  . Hyperlipidemia LDL goal <100 03/10/2013  . Superficial fungus  infection of skin 07/05/2012  . Hot flashes 07/05/2012  . Rectocele 07/05/2012  . HTN (hypertension) 07/04/2012  . Hx of cervical  cancer 07/04/2012  . Stiffness of joint, lower leg, left 08/28/2011  . Difficulty in walking(719.7) 08/28/2011  . Pain in left knee 08/28/2011    Teena Irani, PTA/CLT [phone]  01/06/2015, 5:03 PM  [facility] [address] [place], [place], [address] Phone: [phone]   Fax:  [phone]

## 2015-01-07 ENCOUNTER — Encounter (HOSPITAL_COMMUNITY): Payer: 59 | Admitting: Physical Therapy

## 2015-01-09 LAB — BASIC METABOLIC PANEL
BUN/Creatinine Ratio: 27 — ABNORMAL HIGH (ref 9–23)
BUN: 23 mg/dL (ref 6–24)
CALCIUM: 9.7 mg/dL (ref 8.7–10.2)
CO2: 25 mmol/L (ref 18–29)
Chloride: 99 mmol/L (ref 97–108)
Creatinine, Ser: 0.86 mg/dL (ref 0.57–1.00)
GFR calc Af Amer: 86 mL/min/{1.73_m2} (ref >59)
GFR, EST NON AFRICAN AMERICAN: 75 mL/min/{1.73_m2} (ref >59)
GLUCOSE: 139 mg/dL — AB (ref 65–99)
Potassium: 4.9 mmol/L (ref 3.5–5.2)
Sodium: 140 mmol/L (ref 134–144)

## 2015-01-09 LAB — HEPATIC FUNCTION PANEL
ALBUMIN: 4.5 g/dL (ref 3.5–5.5)
ALT: 14 IU/L (ref 0–32)
AST: 17 IU/L (ref 0–40)
Alkaline Phosphatase: 74 IU/L (ref 39–117)
Bilirubin Total: 0.3 mg/dL (ref 0.0–1.2)
Bilirubin, Direct: 0.09 mg/dL (ref 0.00–0.40)
TOTAL PROTEIN: 7 g/dL (ref 6.0–8.5)

## 2015-01-09 LAB — LIPID PANEL
Chol/HDL Ratio: 4.3 ratio units (ref 0.0–4.4)
Cholesterol, Total: 217 mg/dL — ABNORMAL HIGH (ref 100–199)
HDL: 51 mg/dL (ref >39)
LDL CALC: 142 mg/dL — AB (ref 0–99)
Triglycerides: 121 mg/dL (ref 0–149)
VLDL CHOLESTEROL CAL: 24 mg/dL (ref 5–40)

## 2015-01-09 LAB — HEMOGLOBIN A1C
Est. average glucose Bld gHb Est-mCnc: 131 mg/dL
HEMOGLOBIN A1C: 6.2 % — AB (ref 4.8–5.6)

## 2015-01-12 ENCOUNTER — Ambulatory Visit (INDEPENDENT_AMBULATORY_CARE_PROVIDER_SITE_OTHER): Payer: 59 | Admitting: Family Medicine

## 2015-01-12 ENCOUNTER — Encounter: Payer: Self-pay | Admitting: Family Medicine

## 2015-01-12 ENCOUNTER — Ambulatory Visit (HOSPITAL_COMMUNITY): Payer: 59 | Admitting: Physical Therapy

## 2015-01-12 VITALS — BP 112/80 | Ht 63.0 in | Wt 244.0 lb

## 2015-01-12 DIAGNOSIS — E119 Type 2 diabetes mellitus without complications: Secondary | ICD-10-CM | POA: Diagnosis not present

## 2015-01-12 DIAGNOSIS — R29898 Other symptoms and signs involving the musculoskeletal system: Secondary | ICD-10-CM

## 2015-01-12 DIAGNOSIS — R269 Unspecified abnormalities of gait and mobility: Secondary | ICD-10-CM

## 2015-01-12 DIAGNOSIS — IMO0002 Reserved for concepts with insufficient information to code with codable children: Secondary | ICD-10-CM

## 2015-01-12 DIAGNOSIS — Z23 Encounter for immunization: Secondary | ICD-10-CM | POA: Diagnosis not present

## 2015-01-12 DIAGNOSIS — E785 Hyperlipidemia, unspecified: Secondary | ICD-10-CM

## 2015-01-12 DIAGNOSIS — R262 Difficulty in walking, not elsewhere classified: Secondary | ICD-10-CM

## 2015-01-12 DIAGNOSIS — I10 Essential (primary) hypertension: Secondary | ICD-10-CM

## 2015-01-12 MED ORDER — PHENTERMINE HCL 37.5 MG PO CAPS: 37.5000 mg | ORAL_CAPSULE | ORAL | Status: DC

## 2015-01-12 NOTE — Progress Notes (Signed)
Subjective:    Patient ID: [name], female    DOB: 1955/06/06, 59 y.o.   MRN: [id number]  Hyperlipidemia This is a chronic problem. The current episode started more than 1 year ago. Compliance problems include adherence to exercise (health conscious with diet. no bread and no potatoes).    pt states she is Prediabetic. Inaccurately. Here in office once again patient is reminded that she has true type 2 diabetes. Mostly watching her diet. A1C on bloodwork was 6.2. She does check blood sugar. Been running 130- 160. 132 to 60 at times, often late at night gtrapes and apples Flu vaccine today.    Patient claims compliance with her lipid medication. No obvious side effects. Does not miss a dose. Watching diet also. Medications are reviewed today.  Patient very frustrated by her weight. High BMI see vitals. Many years ago took medicine help her lose weight and it did help  Not exercising due to musculoskeletal injury   Pt states no concerns or problems.   Recent Results (from the past 2160 hour(s))  HM DIABETES EYE EXAM     Status: None   Collection Time: 12/28/14 12:00 AM  Result Value Ref Range   HM Diabetic Eye Exam No Retinopathy No Retinopathy  Lipid panel     Status: Abnormal   Collection Time: 01/08/15  8:39 AM  Result Value Ref Range   Cholesterol, Total 217 (H) 100 - 199 mg/dL   Triglycerides 121 0 - 149 mg/dL   HDL 51 >39 mg/dL    Comment: According to ATP-III Guidelines, HDL-C >59 mg/dL is considered a negative risk factor for CHD.    VLDL Cholesterol Cal 24 5 - 40 mg/dL   LDL Calculated 142 (H) 0 - 99 mg/dL   Chol/HDL Ratio 4.3 0.0 - 4.4 ratio units    Comment:                                   T. Chol/HDL Ratio                                             Men  Women                               1/2 Avg.Risk  3.4    3.3                                   Avg.Risk  5.0    4.4                                2X Avg.Risk  9.6    7.1                                3X  Avg.Risk 23.4   11.0   Hepatic function panel     Status: None   Collection Time: 01/08/15  8:39 AM  Result Value Ref Range   Total Protein 7.0 6.0 - 8.5 g/dL   Albumin 4.5 3.5 - 5.5 g/dL  Bilirubin Total 0.3 0.0 - 1.2 mg/dL   Bilirubin, Direct 0.09 0.00 - 0.40 mg/dL   Alkaline Phosphatase 74 39 - 117 IU/L   AST 17 0 - 40 IU/L   ALT 14 0 - 32 IU/L  Basic metabolic panel     Status: Abnormal   Collection Time: 01/08/15  8:39 AM  Result Value Ref Range   Glucose 139 (H) 65 - 99 mg/dL   BUN 23 6 - 24 mg/dL   Creatinine, Ser 0.86 0.57 - 1.00 mg/dL   GFR calc non Af Amer 75 >59 mL/min/1.73   GFR calc Af Amer 86 >59 mL/min/1.73   BUN/Creatinine Ratio 27 (H) 9 - 23   Sodium 140 134 - 144 mmol/L    Comment: **Effective January 11, 2015 the reference interval**   for Sodium, Serum will be changing to:                                             136 - 144    Potassium 4.9 3.5 - 5.2 mmol/L    Comment: **Effective January 11, 2015 the reference interval**   for Potassium, Serum will be changing to:                          0 -  7 days        3.7 - 5.2                          8 - 30 days        3.7 - 6.4                          1 -  6 months      3.8 - 6.0                   7 months -  1 year        3.8 - 5.3                              >1 year        3.5 - 5.2    Chloride 99 97 - 108 mmol/L    Comment: **Effective January 11, 2015 the reference interval**   for Chloride, Serum will be changing to:                                              97 - 106    CO2 25 18 - 29 mmol/L   Calcium 9.7 8.7 - 10.2 mg/dL  Hemoglobin A1c     Status: Abnormal   Collection Time: 01/08/15  8:39 AM  Result Value Ref Range   Hgb A1c MFr Bld 6.2 (H) 4.8 - 5.6 %    Comment:          Pre-diabetes: 5.7 - 6.4          Diabetes: >6.4          Glycemic control for adults with diabetes: <7.0    Est. average glucose Bld gHb Est-mCnc 131 mg/dL  Review of Systems No headache no chest pain no back pain  no abdominal pain no change in bowel habits ROS otherwise negative    Objective:   Physical Exam Alert vitals stable blood pressure good on repeat HEENT normal. Lungs clear heart regular rhythm ankles without edema. C diabetic foot exam      Assessment & Plan:  Impression 1 type 2 diabetes good control discussed #2 hypertension also good control maintain same medication discussed #3 hyperlipidemia controlled with #4 morbid obesity with many risk factors. Discussed patient would like to try medicine benefits side effects wrist discussed plan initiate phentermine. Maintain other medications. Diet exercise discussed flu shot today recheck in several months. WSL

## 2015-01-12 NOTE — Therapy (Signed)
[place] [place], [place], [address] Phone: [phone]   Fax:  [phone]  Physical Therapy Treatment  Patient Details  Name: [name] MRN: [id number] Date of Birth: 08/07/1955 Referring Provider: Dr. Sharol Given  Encounter Date: 01/12/2015      PT End of Session - 01/12/15 1633    Visit Number 11   Number of Visits 12   Date for PT Re-Evaluation 01/26/15   Authorization Type UHC   Authorization Time Period 12/02/14-01/27/15   PT Start Time 1015   PT Stop Time 1100   PT Time Calculation (min) 45 min   Activity Tolerance Patient tolerated treatment well   Behavior During Therapy [facility] for tasks assessed/performed      Past Medical History  Diagnosis Date  . Hypertension   . Stress incontinence   . Elevated cholesterol   . Superficial fungus infection of skin   . Panniculus   . Hot flashes 07/05/2012  . Rectocele 07/05/2012  . IFG (impaired fasting glucose)   . Diabetes mellitus without complication ([place])     "borderline"  . Obesity   . Mixed stress and urge urinary incontinence   . PONV (postoperative nausea and vomiting)   . Family history of adverse reaction to anesthesia     "I think my mama gets sick"  . Asthmatic bronchitis     "haven't used an inhaler in years" (08/26/2014)  . Exertional shortness of breath   . Arthritis     "fingers occasionally" (08/26/2014)  . Cervical cancer ([place])   . Moody 12/30/2014  . Counseling for estrogen replacement therapy 12/30/2014    Past Surgical History  Procedure Laterality Date  . Knee arthroscopy Bilateral 2008-2010     left-right  . Bilateral salpingectomy  2010    w/LOA  . Total knee arthroplasty Right 2013  . Hammer toe surgery Bilateral 2000-2013    right-left  . Ankle fusion Left 10/24/2013    Procedure: LEFT ANKLE SUBTALAR AND TALONAVICULAR FUSION;  Surgeon: Newt Minion, MD;  Location: [facility];  Service: Orthopedics;  Laterality: Left;  . Colonoscopy    .  Fusion of talonavicular joint Left     Take Down Non-union with Revision Talonavicular and Subtalar Fusion /notes 08/26/2014  . Joint replacement    . Back surgery    . Lumbar disc surgery  2004    Lumbar 4- 5  . Abdominal hysterectomy  1986    "partial"  . Ankle fusion Left 08/26/2014    Procedure: Left Foot Take Down Non-union with Revision Talonavicular and Subtalar Fusion;  Surgeon: Newt Minion, MD;  Location: [facility];  Service: Orthopedics;  Laterality: Left;    There were no vitals filed for this visit.  Visit Diagnosis:  Difficulty walking  Joint stiffness of left ankle and/or foot  Weakness of left foot  Abnormality of gait      Subjective Assessment - 01/12/15 1625    Subjective Pt states [place] and it hurt really bad that night.  Currently with 2/10 pain in her dorsal forefoot and lateral ankle region.     Currently in Pain? Yes   Pain Score 2    Pain Location Foot   Pain Orientation Left            [facility] PT Assessment - 01/12/15 0001    Assessment   Medical Diagnosis L subtalar fusion   Referring Provider Dr. Sharol Given  Onset Date/Surgical Date 08/26/14                     [facility] Adult PT Treatment/Exercise - 01/12/15 1629    Manual Therapy   Manual Therapy Joint mobilization;Soft tissue mobilization   Manual therapy comments Lt ankle and plantar fascia   Joint Mobilization to improve joint mobiltiy   Soft tissue mobilization to L achilles   Ankle Exercises: Stretches   Plantar Fascia Stretch 3 reps;30 seconds   Slant Board Stretch 3 reps;30 seconds   Ankle Exercises: Standing   Vector Stance Left;Limitations  10 reps 3"   SLS 20" max each LE   Ankle Exercises: Machines for Strengthening   Cybex Leg Press 3 Pl ankle Lt only 15 reps                  PT Short Term Goals - 12/29/14 1307    PT SHORT TERM GOAL #1   Title Pt will be independent with HEP   Time 2   Period Weeks   Status  Achieved   PT SHORT TERM GOAL #2   Title Improve L ankle plantarflexion, inversion, and eversion strength to 4/5 to improve gait mechanics.    Time 2   Period Weeks   Status On-going   PT SHORT TERM GOAL #3   Title Pt will maintain SLS x10 seconds on LLE to demonstrate improved balance.    Time 2   Period Weeks   Status On-going           PT Long Term Goals - 12/29/14 1307    PT LONG TERM GOAL #1   Title Pt will be independent with advanced HEP.    Time 4   Period Weeks   Status On-going   PT LONG TERM GOAL #2   Title Improve L ankle plantarflexion, inversion, and eversion to 4+/5 or greater to improve gait mechanics.    Time 4   Period Weeks   Status On-going   PT LONG TERM GOAL #3   Title Pt will maintain SLS x 15 seconds or greater to demonstrate improved balance.   Time 4   Period Weeks   Status On-going   PT LONG TERM GOAL #4   Title Pt will ambulate 700 feet or greater during 3 minute walk test to demonstrate improved gait speed and functional mobility.   Time 4   Period Weeks   Status On-going   PT LONG TERM GOAL #5   Title Pt will ambulate 1,000 feet with equal WB, equal step length, and <1/10 pain to allow her to walk community distances.   Time 4   Period Weeks   Status On-going               Plan - 01/12/15 1633    Clinical Impression Statement Pt still unable to complete Lt single heelriase due to discomfort.  Added cybex calf press today to provide weight bearing assist through ROM.  Pt able to complete and reported less stiffness following.   Also added SLS and vector stance to improve stability in Lt LE.     PT Next Visit Plan Continue to progress functional strength and decrease pain. Re-evaluate next session.         Problem List Patient Active Problem List   Diagnosis Date Noted  . Moody 12/30/2014  . Counseling for estrogen replacement therapy 12/30/2014  . 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 Mixed stress and urge urinary  incontinence 02/10/2014  . Diabetes ([place]) 01/18/2014  . Posterior tibialis muscle dysfunction 10/24/2013  . Hyperlipidemia LDL goal <100 03/10/2013  . Superficial fungus infection of skin 07/05/2012  . Hot flashes 07/05/2012  . Rectocele 07/05/2012  . HTN (hypertension) 07/04/2012  . Hx of cervical cancer 07/04/2012  . Stiffness of joint, lower leg, left 08/28/2011  . Difficulty in walking(719.7) 08/28/2011  . Pain in left knee 08/28/2011    Teena Irani, PTA/CLT [phone] 01/12/2015, 4:35 PM  [facility] [address] [place], [place], [address] Phone: [phone]   Fax:  [phone]  Name: [name] MRN: [id number] Date of Birth: 08/02/1955

## 2015-01-14 ENCOUNTER — Ambulatory Visit (HOSPITAL_COMMUNITY): Payer: 59 | Admitting: Physical Therapy

## 2015-01-17 ENCOUNTER — Encounter: Payer: Self-pay | Admitting: Family Medicine

## 2015-01-19 ENCOUNTER — Ambulatory Visit (HOSPITAL_COMMUNITY): Payer: 59 | Admitting: Physical Therapy

## 2015-01-19 ENCOUNTER — Telehealth: Payer: Self-pay | Admitting: Adult Health

## 2015-01-19 DIAGNOSIS — R269 Unspecified abnormalities of gait and mobility: Secondary | ICD-10-CM

## 2015-01-19 DIAGNOSIS — R262 Difficulty in walking, not elsewhere classified: Secondary | ICD-10-CM | POA: Diagnosis not present

## 2015-01-19 DIAGNOSIS — IMO0002 Reserved for concepts with insufficient information to code with codable children: Secondary | ICD-10-CM

## 2015-01-19 DIAGNOSIS — R29898 Other symptoms and signs involving the musculoskeletal system: Secondary | ICD-10-CM

## 2015-01-19 NOTE — Telephone Encounter (Signed)
Spoke with pt. Pt thinks she is having side effects from Melia. She is having stomach cramps, restless leg syndrome, diarrhea and constipation, and rectal bleeding. Please advise. Thanks!! [place]

## 2015-01-19 NOTE — Therapy (Signed)
[place], [place], [address] Phone: [phone]   Fax:  [phone]  Physical Therapy Treatment / discharge  Patient Details  Name: [name] MRN: [id number] Date of Birth: February 14, 1956 Referring Provider: Dr Sharol Given  Encounter Date: 01/19/2015      PT End of Session - 01/19/15 1401    Visit Number 12   Number of Visits 12   Date for PT Re-Evaluation 01/26/15   Authorization Type [employer]   Authorization Time Period 12/02/14-01/27/15   Authorization - Visit Number 12   Authorization - Number of Visits 12   PT Start Time 1310   PT Stop Time 1355   PT Time Calculation (min) 45 min   Activity Tolerance Patient tolerated treatment well   Behavior During Therapy [facility] for tasks assessed/performed      Past Medical History  Diagnosis Date  . Hypertension   . Stress incontinence   . Elevated cholesterol   . Superficial fungus infection of skin   . Panniculus   . Hot flashes 07/05/2012  . Rectocele 07/05/2012  . IFG (impaired fasting glucose)   . Diabetes mellitus without complication ([place])     "borderline"  . Obesity   . Mixed stress and urge urinary incontinence   . PONV (postoperative nausea and vomiting)   . Family history of adverse reaction to anesthesia     "I think my mama gets sick"  . Asthmatic bronchitis     "haven't used an inhaler in years" (08/26/2014)  . Exertional shortness of breath   . Arthritis     "fingers occasionally" (08/26/2014)  . Cervical cancer ([place])   . Moody 12/30/2014  . Counseling for estrogen replacement therapy 12/30/2014    Past Surgical History  Procedure Laterality Date  . Knee arthroscopy Bilateral 2008-2010     left-right  . Bilateral salpingectomy  2010    w/LOA  . Total knee arthroplasty Right 2013  . Hammer toe surgery Bilateral 2000-2013    right-left  . Ankle fusion Left 10/24/2013    Procedure: LEFT ANKLE SUBTALAR AND TALONAVICULAR FUSION;  Surgeon: Newt Minion, MD;   Location: [facility];  Service: Orthopedics;  Laterality: Left;  . Colonoscopy    . Fusion of talonavicular joint Left     Take Down Non-union with Revision Talonavicular and Subtalar Fusion /notes 08/26/2014  . Joint replacement    . Back surgery    . Lumbar disc surgery  2004    Lumbar 4- 5  . Abdominal hysterectomy  1986    "partial"  . Ankle fusion Left 08/26/2014    Procedure: Left Foot Take Down Non-union with Revision Talonavicular and Subtalar Fusion;  Surgeon: Newt Minion, MD;  Location: [facility];  Service: Orthopedics;  Laterality: Left;    There were no vitals filed for this visit.  Visit Diagnosis:  Difficulty walking  Joint stiffness of left ankle and/or foot  Weakness of left foot  Abnormality of gait      Subjective Assessment - 01/19/15 1315    Subjective Pt states she does not have pain first thing in the morning but gets worse as the day goes on.  Reports pain is on the outside of her foot up the back of her leg (achilles)   Currently in Pain? Yes   Pain Score 3    Pain Location Ankle   Pain Orientation Left            [facility] PT Assessment -  01/19/15 1321    Assessment   Medical Diagnosis L subtalar fusion   Referring Provider Dr Sharol Given   Onset Date/Surgical Date 08/26/14   Next MD Visit 01/25/2015   Observation/Other Assessments   Focus on Therapeutic Outcomes (FOTO)  55% limited   AROM   AROM Assessment Site Ankle   Right/Left Ankle Left   Left Ankle Dorsiflexion 10  was 6 degrees   Left Ankle Plantar Flexion 38  was 32 degrees   Left Ankle Inversion 18  was 18 degrees   Left Ankle Eversion 20  was 20 degrees   PROM   PROM Assessment Site Ankle   Right/Left Ankle Left   Left Ankle Dorsiflexion 12  was 10 degrees   Left Ankle Plantar Flexion 38  was 38 degrees   Left Ankle Inversion 24  was 24 degrees   Left Ankle Eversion 30  was 24 degrees   Strength   Strength Assessment Site Ankle   Right/Left Ankle Left   Left Ankle Dorsiflexion 5/5   was 4+/5   Left Ankle Plantar Flexion 4/5  was 4/5   Left Ankle Inversion 4-/5  was 4-/5   Left Ankle Eversion 5/5  was 4/5   Ambulation/Gait   Gait Comments TUG: 6.58   6 minute walk test results    Aerobic Endurance Distance Walked 700   Endurance additional comments 3 minutes                               PT Short Term Goals - 01/19/15 1332    PT SHORT TERM GOAL #1   Title Pt will be independent with HEP   Time 2   Period Weeks   Status Achieved   PT SHORT TERM GOAL #2   Title Improve L ankle plantarflexion, inversion, and eversion strength to 4/5 to improve gait mechanics.    Time 2   Period Weeks   Status Achieved   PT SHORT TERM GOAL #3   Title Pt will maintain SLS x10 seconds on LLE to demonstrate improved balance.    Time 2   Period Weeks   Status Achieved           PT Long Term Goals - 01/19/15 1332    PT LONG TERM GOAL #1   Title Pt will be independent with advanced HEP.    Time 4   Period Weeks   Status Achieved   PT LONG TERM GOAL #2   Title Improve L ankle plantarflexion, inversion, and eversion to 4+/5 or greater to improve gait mechanics.    Time 4   Period Weeks   Status Partially Met   PT LONG TERM GOAL #3   Title Pt will maintain SLS x 15 seconds or greater to demonstrate improved balance.   Time 4   Period Weeks   Status Achieved   PT LONG TERM GOAL #4   Title Pt will ambulate 700 feet or greater during 3 minute walk test to demonstrate improved gait speed and functional mobility.   Time 4   Period Weeks   Status achieved   PT LONG TERM GOAL #5   Title Pt will ambulate 1,000 feet with equal WB, equal step length, and <1/10 pain to allow her to walk community distances.   Time 4   Period Weeks   Status Not Met               Plan -  01/19/15 1401    Clinical Impression Statement Re-evaluation completed today without overall improvements noted.  PT reports 80% subjective improviment with largest  limitation still being pain after ambulatiing community distances and after prolonged actvitiy.  ROM and strength are now functional with calf anke inverters being the weakest.  Pt has met all STG's and 3/5 LTG's.  Pt instructed to continue HEP and increase walking actvitiy at home.  PT is to follow up with MD next week.      PT Next Visit Plan Discharge to HEP as most goals met and patient agrees to discharge at this time.         Problem List Patient Active Problem List   Diagnosis Date Noted  . Moody 12/30/2014  . Counseling for estrogen replacement therapy 12/30/2014  . 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 Mixed stress and urge urinary incontinence 02/10/2014  . Diabetes ([place]) 01/18/2014  . Posterior tibialis muscle dysfunction 10/24/2013  . Hyperlipidemia LDL goal <100 03/10/2013  . Superficial fungus infection of skin 07/05/2012  . Hot flashes 07/05/2012  . Rectocele 07/05/2012  . HTN (hypertension) 07/04/2012  . Hx of cervical cancer 07/04/2012  . Stiffness of joint, lower leg, left 08/28/2011  . Difficulty in walking(719.7) 08/28/2011  . Pain in left knee 08/28/2011    Teena Irani, PTA/CLT [phone]  01/19/2015, 2:31 PM  [facility] [address] [place], [place], [address] Phone: [phone]   Fax:  [phone]  Name: [name] MRN: [id number] Date of Birth: 09/23/1955

## 2015-01-19 NOTE — Telephone Encounter (Signed)
Left message could have some diarrhea but not had pts have the others could stop for a few days and see if better

## 2015-01-21 ENCOUNTER — Ambulatory Visit (HOSPITAL_COMMUNITY): Payer: 59

## 2015-01-27 ENCOUNTER — Ambulatory Visit: Payer: 59 | Admitting: Adult Health

## 2015-01-27 ENCOUNTER — Telehealth: Payer: Self-pay | Admitting: Adult Health

## 2015-01-27 NOTE — Telephone Encounter (Signed)
Left message I called to call me back tomorrow

## 2015-03-17 ENCOUNTER — Telehealth: Payer: Self-pay | Admitting: Obstetrics and Gynecology

## 2015-03-17 NOTE — Telephone Encounter (Signed)
Pt states that she has hot flashes all the time and needs something for them. i advised the pt that Anderson Malta was out of the office but would be stopping by in Friday and that if she could wait I would send the message to Anderson Malta and she would call her on Friday. Pt stated that that would be fine.

## 2015-03-17 NOTE — Telephone Encounter (Signed)
Pt called stating that she is having really bad flashes, pt normally sees Anderson Malta but because Anderson Malta is on vacation and the patient is miserable and needs a something for it. Please contact pt

## 2015-03-18 NOTE — Telephone Encounter (Signed)
Pt has refills on [place], try taking one in am

## 2015-04-14 ENCOUNTER — Encounter: Payer: Self-pay | Admitting: Family Medicine

## 2015-04-14 ENCOUNTER — Ambulatory Visit (INDEPENDENT_AMBULATORY_CARE_PROVIDER_SITE_OTHER): Payer: 59 | Admitting: Family Medicine

## 2015-04-14 VITALS — BP 122/80 | Ht 63.0 in | Wt 230.5 lb

## 2015-04-14 DIAGNOSIS — E119 Type 2 diabetes mellitus without complications: Secondary | ICD-10-CM | POA: Diagnosis not present

## 2015-04-14 DIAGNOSIS — I10 Essential (primary) hypertension: Secondary | ICD-10-CM | POA: Diagnosis not present

## 2015-04-14 DIAGNOSIS — M545 Low back pain, unspecified: Secondary | ICD-10-CM

## 2015-04-14 DIAGNOSIS — H6121 Impacted cerumen, right ear: Secondary | ICD-10-CM

## 2015-04-14 LAB — POCT GLYCOSYLATED HEMOGLOBIN (HGB A1C): Hemoglobin A1C: 6.4

## 2015-04-14 MED ORDER — PHENTERMINE HCL 37.5 MG PO CAPS: 37.5000 mg | ORAL_CAPSULE | ORAL | Status: DC

## 2015-04-14 NOTE — Progress Notes (Signed)
   Subjective:    Patient ID: [name], female    DOB: 02/18/1956, 60 y.o.   MRN: [id number]  Patient arrives office with multiple concerns. Diabetes She presents for her follow-up diabetic visit. She has type 2 diabetes mellitus. No MedicAlert identification noted. She has not had a previous visit with a dietitian. She sees a podiatrist.Eye exam is current (December 28 2014).   Patient has c/o right ear stopping up. Occurs intermittently. No noticeable congestion drainage or sore throat.    and lower back pain.  Trying to walk   Notes back pain , ongoing trouble rolling over, often in low back and aches, stay s in the low back , ibu using 200 four two or three times   Results for orders placed or performed in visit on 04/14/15  POCT glycosylated hemoglobin (Hb A1C)  Result Value Ref Range   Hemoglobin A1C 6.4    notes plus cutting down sugars    claims compliance with blood pressure medication. No obvious side effects. Watching salt intake. Review of Systems  no headache no chest pain no abdominal pain no change in bowel habits no blood in stool ROS otherwise negative    Objective:   Physical Exam   alert vital stable lungs clear heart regular in rhythm HEENT some moderate wax left TM open  Ankles trace edema      Assessment & Plan:   impression 1 type 2 diabetes good control to maintain same discussed #2 hypertension meds reviewed today good control to maintain same level #3 right cerumen impaction. Offered ENT referral for wax removal #4 low back pain musculoskeletal control fairly well by ibuprofen plan low back exercises  , offered ENT referral to get wax removed from ear, maintain same dose of blood pressure medication. Phentermine written out. And contains same effort with diet on diabetes. Recheck in several months. WSL

## 2015-05-31 ENCOUNTER — Encounter (HOSPITAL_COMMUNITY): Payer: Self-pay

## 2015-07-08 ENCOUNTER — Telehealth: Payer: Self-pay | Admitting: Family Medicine

## 2015-07-08 DIAGNOSIS — I10 Essential (primary) hypertension: Secondary | ICD-10-CM

## 2015-07-08 DIAGNOSIS — E118 Type 2 diabetes mellitus with unspecified complications: Secondary | ICD-10-CM

## 2015-07-08 DIAGNOSIS — E785 Hyperlipidemia, unspecified: Secondary | ICD-10-CM

## 2015-07-08 NOTE — Telephone Encounter (Signed)
Spoke with patient and informed her per Dr.Steve Luking- labs ordered for upcoming appointment. Informed patient to fast prior to labs.

## 2015-07-08 NOTE — Telephone Encounter (Signed)
Pt is requesting lab orders to be sent over for her appt on Tuesday. Pt is wanting to have them done tomorrow. Last labs per epic were: lipid,hepatic,bmp,and a1c on 01/08/15.

## 2015-07-08 NOTE — Telephone Encounter (Signed)
Rep same 

## 2015-07-12 LAB — BASIC METABOLIC PANEL
BUN/Creatinine Ratio: 32 — ABNORMAL HIGH (ref 9–23)
BUN: 27 mg/dL — ABNORMAL HIGH (ref 6–24)
CALCIUM: 9.4 mg/dL (ref 8.7–10.2)
CO2: 20 mmol/L (ref 18–29)
Chloride: 101 mmol/L (ref 96–106)
Creatinine, Ser: 0.84 mg/dL (ref 0.57–1.00)
GFR calc Af Amer: 88 mL/min/{1.73_m2} (ref >59)
GFR, EST NON AFRICAN AMERICAN: 76 mL/min/{1.73_m2} (ref >59)
Glucose: 155 mg/dL — ABNORMAL HIGH (ref 65–99)
POTASSIUM: 4.8 mmol/L (ref 3.5–5.2)
Sodium: 139 mmol/L (ref 134–144)

## 2015-07-12 LAB — HEPATIC FUNCTION PANEL
ALBUMIN: 4.2 g/dL (ref 3.5–5.5)
ALT: 19 IU/L (ref 0–32)
AST: 21 IU/L (ref 0–40)
Alkaline Phosphatase: 66 IU/L (ref 39–117)
BILIRUBIN TOTAL: 0.3 mg/dL (ref 0.0–1.2)
Bilirubin, Direct: 0.06 mg/dL (ref 0.00–0.40)
Total Protein: 6.8 g/dL (ref 6.0–8.5)

## 2015-07-12 LAB — LIPID PANEL
CHOL/HDL RATIO: 4 ratio (ref 0.0–4.4)
Cholesterol, Total: 212 mg/dL — ABNORMAL HIGH (ref 100–199)
HDL: 53 mg/dL (ref >39)
LDL CALC: 133 mg/dL — AB (ref 0–99)
Triglycerides: 131 mg/dL (ref 0–149)
VLDL Cholesterol Cal: 26 mg/dL (ref 5–40)

## 2015-07-12 LAB — HEMOGLOBIN A1C
Est. average glucose Bld gHb Est-mCnc: 131 mg/dL
Hgb A1c MFr Bld: 6.2 % — ABNORMAL HIGH (ref 4.8–5.6)

## 2015-07-13 ENCOUNTER — Encounter: Payer: Self-pay | Admitting: Family Medicine

## 2015-07-13 ENCOUNTER — Ambulatory Visit (INDEPENDENT_AMBULATORY_CARE_PROVIDER_SITE_OTHER): Payer: 59 | Admitting: Family Medicine

## 2015-07-13 VITALS — BP 130/88 | Ht 63.0 in | Wt 227.4 lb

## 2015-07-13 DIAGNOSIS — E119 Type 2 diabetes mellitus without complications: Secondary | ICD-10-CM | POA: Diagnosis not present

## 2015-07-13 DIAGNOSIS — E785 Hyperlipidemia, unspecified: Secondary | ICD-10-CM | POA: Diagnosis not present

## 2015-07-13 DIAGNOSIS — I10 Essential (primary) hypertension: Secondary | ICD-10-CM

## 2015-07-13 MED ORDER — SIMVASTATIN 40 MG PO TABS: ORAL_TABLET | ORAL | Status: DC

## 2015-07-13 MED ORDER — LISINOPRIL-HYDROCHLOROTHIAZIDE 10-12.5 MG PO TABS: 1.0000 | ORAL_TABLET | Freq: Every day | ORAL | Status: DC

## 2015-07-13 MED ORDER — PHENTERMINE HCL 37.5 MG PO CAPS: 37.5000 mg | ORAL_CAPSULE | ORAL | Status: DC

## 2015-07-13 NOTE — Progress Notes (Signed)
   Subjective:    Patient ID: [name], female    DOB: Nov 01, 1955, 60 y.o.   MRN: [id number] Patient arrives office with numerous concerns Diabetes She presents for her follow-up diabetic visit. She has type 2 diabetes mellitus. There are no hypoglycemic associated symptoms. There are no diabetic associated symptoms. There are no hypoglycemic complications. There are no diabetic complications. There are no known risk factors for coronary artery disease. Current diabetic treatment includes diet. She is compliant with treatment all of the time.   Results for orders placed or performed in visit on 07/08/15  Lipid panel  Result Value Ref Range   Cholesterol, Total 212 (H) 100 - 199 mg/dL   Triglycerides 131 0 - 149 mg/dL   HDL 53 >39 mg/dL   VLDL Cholesterol Cal 26 5 - 40 mg/dL   LDL Calculated 133 (H) 0 - 99 mg/dL   Chol/HDL Ratio 4.0 0.0 - 4.4 ratio units  Hepatic function panel  Result Value Ref Range   Total Protein 6.8 6.0 - 8.5 g/dL   Albumin 4.2 3.5 - 5.5 g/dL   Bilirubin Total 0.3 0.0 - 1.2 mg/dL   Bilirubin, Direct 0.06 0.00 - 0.40 mg/dL   Alkaline Phosphatase 66 39 - 117 IU/L   AST 21 0 - 40 IU/L   ALT 19 0 - 32 IU/L  Basic metabolic panel  Result Value Ref Range   Glucose 155 (H) 65 - 99 mg/dL   BUN 27 (H) 6 - 24 mg/dL   Creatinine, Ser 0.84 0.57 - 1.00 mg/dL   GFR calc non Af Amer 76 >59 mL/min/1.73   GFR calc Af Amer 88 >59 mL/min/1.73   BUN/Creatinine Ratio 32 (H) 9 - 23   Sodium 139 134 - 144 mmol/L   Potassium 4.8 3.5 - 5.2 mmol/L   Chloride 101 96 - 106 mmol/L   CO2 20 18 - 29 mmol/L   Calcium 9.4 8.7 - 10.2 mg/dL  HgB A1c  Result Value Ref Range   Hgb A1c MFr Bld 6.2 (H) 4.8 - 5.6 %   Est. average glucose Bld gHb Est-mCnc 131 mg/dL   Patient states that she has some hip and back pain. , progressive over the past six months,no ortho spec no chiro   Eye doc around one yr ago , due next wk  140 143 ish, exercisisng several times per wk  Patient claims  compliance with blood pressure medication. No obvious side effects. Meds reviewed today has cut down salt intake.  Patient notes compliance with Zocor. Admits only so-so compliance with diet  Review of Systems No headache, no major weight loss or weight gain, no chest pain no back pain abdominal pain no change in bowel habits complete ROS otherwise negative     Objective:   Physical Exam  Alert vitals stable blood pressure good on repeat HEENT normal lungs clear heart rhythm left hip slight tenderness with external rotation positive sciatic notch tenderness negative straight leg raise C diabetic foot exam      Assessment & Plan:  Impression 1 type 2 diabetes excellent control discussed #2 hypertension good control discussed #3 hyperlipidemia fair control patient wishes to hold off on increasing meds water this for now #4 morbid obesity 3 more months on the phentermine plan diet exercise discussed as noted above all meds refilled recheck in 6 months WSL

## 2015-10-13 ENCOUNTER — Other Ambulatory Visit: Payer: Self-pay | Admitting: Family Medicine

## 2015-10-13 DIAGNOSIS — Z1231 Encounter for screening mammogram for malignant neoplasm of breast: Secondary | ICD-10-CM

## 2015-11-08 ENCOUNTER — Ambulatory Visit (HOSPITAL_COMMUNITY): Payer: 59

## 2015-11-20 ENCOUNTER — Other Ambulatory Visit: Payer: Self-pay | Admitting: Family Medicine

## 2016-01-11 ENCOUNTER — Other Ambulatory Visit: Payer: Self-pay | Admitting: Family Medicine

## 2016-01-12 ENCOUNTER — Ambulatory Visit: Payer: 59 | Admitting: Family Medicine

## 2016-01-17 ENCOUNTER — Encounter: Payer: Self-pay | Admitting: Family Medicine

## 2016-01-17 ENCOUNTER — Ambulatory Visit (INDEPENDENT_AMBULATORY_CARE_PROVIDER_SITE_OTHER): Payer: 59 | Admitting: Family Medicine

## 2016-01-17 VITALS — BP 110/74 | Temp 98.6°F | Ht 63.0 in | Wt 235.0 lb

## 2016-01-17 DIAGNOSIS — B029 Zoster without complications: Secondary | ICD-10-CM

## 2016-01-17 MED ORDER — HYDROCODONE-ACETAMINOPHEN 5-325 MG PO TABS: 1.0000 | ORAL_TABLET | Freq: Four times a day (QID) | ORAL | 0 refills | Status: DC | PRN

## 2016-01-17 MED ORDER — VALACYCLOVIR HCL 1 G PO TABS: 1000.0000 mg | ORAL_TABLET | Freq: Three times a day (TID) | ORAL | 0 refills | Status: DC

## 2016-01-17 NOTE — Progress Notes (Signed)
   Subjective:    Patient ID: [name], female    DOB: [birth year], 60 y.o.   MRN: [id number]  Rash  This is a new problem. Episode onset: 4 -5 days. Location: left thigh. The rash is characterized by burning and pain. Treatments tried: cream, hot shower, ice pack.   Burning pains at times pain preceded rash by several days.  Did have chickenpox as a child next  Sharp shooting pains.  Symptom care helping some   Review of Systems  Skin: Positive for rash.  No fever no vomiting     Objective:   Physical Exam  .be Alert vitals stable, NAD. Blood pressure good on repeat. HEENT normal. Lungs clear. Heart regular rate and rhythm.  Skin patches of discrete vesicles on erythematous base and sensitive to touch left upper thigh and lateral hip     Assessment & Plan:  Impression shingles discussed at length plan Valtrex 1 g 3 times a day 7 days hydrocodone when necessary for pains warning signs discussed symptom care discussed

## 2016-03-30 NOTE — Telephone Encounter (Signed)
No, overdue for htn and glucose and six mo f u

## 2016-04-07 ENCOUNTER — Other Ambulatory Visit: Payer: Self-pay | Admitting: Family Medicine

## 2016-05-08 ENCOUNTER — Ambulatory Visit (INDEPENDENT_AMBULATORY_CARE_PROVIDER_SITE_OTHER): Payer: 59 | Admitting: Family Medicine

## 2016-05-08 VITALS — BP 136/88 | Wt 243.2 lb

## 2016-05-08 DIAGNOSIS — E119 Type 2 diabetes mellitus without complications: Secondary | ICD-10-CM | POA: Diagnosis not present

## 2016-05-08 DIAGNOSIS — E7800 Pure hypercholesterolemia, unspecified: Secondary | ICD-10-CM | POA: Diagnosis not present

## 2016-05-08 DIAGNOSIS — I10 Essential (primary) hypertension: Secondary | ICD-10-CM | POA: Diagnosis not present

## 2016-05-08 MED ORDER — LISINOPRIL-HYDROCHLOROTHIAZIDE 10-12.5 MG PO TABS: 1.0000 | ORAL_TABLET | Freq: Every day | ORAL | 1 refills | Status: DC

## 2016-05-08 MED ORDER — SIMVASTATIN 40 MG PO TABS: ORAL_TABLET | ORAL | 1 refills | Status: DC

## 2016-05-08 NOTE — Progress Notes (Signed)
   Subjective:    Patient ID: [name], female    DOB: 1955/09/05, 61 y.o.   MRN: [id number]  HPI Patient in office today for follow-up on HTN.  She states she is doing well.  Blood pressure medicine and blood pressure levels reviewed today with patient. Compliant with blood pressure medicine. States does not miss a dose. No obvious side effects. Blood pressure generally good when checked elsewhere. Watching salt intake.  Patient continues to take lipid medication regularly. No obvious side effects from it. Generally does not miss a dose. Prior blood work results are reviewed with patient. Patient continues to work on fat intake in diet  Patient claims compliance with diabetes medication. No obvious side effects. Reports no substantial low sugar spells. Most numbers are generally in good range when checked fasting. Generally does not miss a dose of medication. Watching diabetic diet closely  Most sugars 130 ish  Walking some but not a lot  Ck sugar once per few wks    Eyes overall good, but pt notes slght blurring at times. Eye dr visist cue.       Review of Systems No headache, no major weight loss or weight gain, no chest pain no back pain abdominal pain no change in bowel habits complete ROS otherwise negative     Objective:   Physical Exam Alert vitals stable, NAD. Blood pressure good on repeat. HEENT normal. Lungs clear. Heart regular rate and rhythm.        Assessment & Plan:  Impression 1 type 2 diabetes glucoses reviewed old A1c's reviewed currently morning sugars in good shape. A1c pending #2 hypertension good control discussed compliance discussed in good level #3 hyperlipidemia prior blood work reviewed time for new blood work medications discussed tolerating well plan medications refilled. Appropriate blood work diet exercise discussed further recommendations based results

## 2016-05-09 ENCOUNTER — Encounter: Payer: Self-pay | Admitting: Family Medicine

## 2016-05-09 NOTE — Addendum Note (Signed)
Addended by: Mikey Kirschner on: 05/09/2016 09:32 AM   Modules accepted: Orders

## 2016-05-14 LAB — LIPID PANEL
Chol/HDL Ratio: 3.8 ratio units (ref 0.0–4.4)
Cholesterol, Total: 178 mg/dL (ref 100–199)
HDL: 47 mg/dL (ref >39)
LDL Calculated: 108 mg/dL — ABNORMAL HIGH (ref 0–99)
Triglycerides: 113 mg/dL (ref 0–149)
VLDL CHOLESTEROL CAL: 23 mg/dL (ref 5–40)

## 2016-05-14 LAB — BASIC METABOLIC PANEL
BUN / CREAT RATIO: 17 (ref 12–28)
BUN: 14 mg/dL (ref 8–27)
CO2: 23 mmol/L (ref 18–29)
CREATININE: 0.81 mg/dL (ref 0.57–1.00)
Calcium: 9.3 mg/dL (ref 8.7–10.3)
Chloride: 101 mmol/L (ref 96–106)
GFR, EST AFRICAN AMERICAN: 91 mL/min/{1.73_m2} (ref >59)
GFR, EST NON AFRICAN AMERICAN: 79 mL/min/{1.73_m2} (ref >59)
Glucose: 156 mg/dL — ABNORMAL HIGH (ref 65–99)
Potassium: 4.7 mmol/L (ref 3.5–5.2)
Sodium: 140 mmol/L (ref 134–144)

## 2016-05-14 LAB — HEPATIC FUNCTION PANEL
ALBUMIN: 4.3 g/dL (ref 3.6–4.8)
ALK PHOS: 68 IU/L (ref 39–117)
ALT: 12 IU/L (ref 0–32)
AST: 17 IU/L (ref 0–40)
BILIRUBIN, DIRECT: 0.09 mg/dL (ref 0.00–0.40)
Bilirubin Total: 0.3 mg/dL (ref 0.0–1.2)
Total Protein: 6.9 g/dL (ref 6.0–8.5)

## 2016-05-14 LAB — MICROALBUMIN / CREATININE URINE RATIO
Creatinine, Urine: 174.1 mg/dL
MICROALB/CREAT RATIO: 3.5 mg/g{creat} (ref 0.0–30.0)
Microalbumin, Urine: 6.1 ug/mL

## 2016-05-14 LAB — HEMOGLOBIN A1C
ESTIMATED AVERAGE GLUCOSE: 134 mg/dL
Hgb A1c MFr Bld: 6.3 % — ABNORMAL HIGH (ref 4.8–5.6)

## 2016-05-17 ENCOUNTER — Encounter: Payer: Self-pay | Admitting: Family Medicine

## 2016-06-01 ENCOUNTER — Telehealth: Payer: Self-pay | Admitting: Family Medicine

## 2016-06-01 NOTE — Telephone Encounter (Signed)
Patient is requesting results to the lab work she completed on 05/13/16.

## 2016-06-01 NOTE — Telephone Encounter (Signed)
See letter sent 05/17/16: Dear [name] your kidney function and liver function and lipid profile and urine protein test and hemoglobin A1c are all in very good limits, please stick with your medication and keep working on diet and exercise, Dr. Richardson Landry  Read letter above to patient. Patient verbalized understanding.

## 2016-06-14 ENCOUNTER — Telehealth: Payer: Self-pay

## 2016-06-15 ENCOUNTER — Other Ambulatory Visit: Payer: Self-pay

## 2016-06-15 DIAGNOSIS — Z1211 Encounter for screening for malignant neoplasm of colon: Secondary | ICD-10-CM

## 2016-06-15 NOTE — Telephone Encounter (Signed)
See triage

## 2016-06-15 NOTE — Telephone Encounter (Signed)
Gastroenterology Pre-Procedure Review  Request Date: 06/14/2016 Requesting Physician:   PATIENT REVIEW QUESTIONS: The patient responded to the following health history questions as indicated:    1. Diabetes Melitis: no 2. Joint replacements in the past 12 months: no 3. Major health problems in the past 3 months: no 4. Has an artificial valve or MVP: no 5. Has a defibrillator: no 6. Has been advised in past to take antibiotics in advance of a procedure like teeth cleaning: no 7. Family history of colon cancer: no  8. Alcohol Use: no 9. History of sleep apnea: no  10. History of coronary artery or other vascular stents placed within the last 12 months: no    MEDICATIONS & ALLERGIES:    Patient reports the following regarding taking any blood thinners:   Plavix? no Aspirin? YES Coumadin? no Brilinta? no Xarelto? no Eliquis? no Pradaxa? no Savaysa? no Effient? no  Patient confirms/reports the following medications:  Current Outpatient Prescriptions  Medication Sig Dispense Refill  . aspirin 81 MG tablet Take 81 mg by mouth daily.    Marland Kitchen DHEA 50 MG CAPS Take 1 capsule by mouth daily.    Marland Kitchen ibuprofen (ADVIL,MOTRIN) 200 MG tablet Take 400 mg by mouth every 6 (six) hours as needed for moderate pain.     Marland Kitchen lisinopril-hydrochlorothiazide (PRINZIDE,ZESTORETIC) 10-12.5 MG tablet Take 1 tablet by mouth daily. 90 tablet 1  . progesterone (PROMETRIUM) 100 MG capsule Take 100 mg by mouth daily.    . simvastatin (ZOCOR) 40 MG tablet TAKE 1 TABLET (40 MG TOTAL) BY MOUTH DAILY AT 6 PM. 90 tablet 1  . thyroid (ARMOUR) 30 MG tablet Take 30 mg by mouth daily before breakfast.     . nystatin (MYCOSTATIN/NYSTOP) 100000 UNIT/GM POWD USE POWDER 2-3 X DAILY TO AFFECTED AREA (Patient not taking: Reported on 05/08/2016) 60 g 3  . ONE TOUCH ULTRA TEST test strip USE TO TEST BLOOD SUGAR DAILY 100 each 1  . ONETOUCH DELICA LANCETS 49S MISC USE TO OBTAIN A BLOOD SPECIMEN TO TEST BLOOD SUGAR 100 each 1   No  current facility-administered medications for this visit.     Patient confirms/reports the following allergies:  Allergies  Allergen Reactions  . Naprosyn [Naproxen] Nausea Only    Can take Alleve but years ago patient took a liquid form of this medication and it made me nauseated    No orders of the defined types were placed in this encounter.   AUTHORIZATION INFORMATION Primary Insurance:   ID #:   Group #:  Pre-Cert / Auth required: Pre-Cert / Auth #:   Secondary Insurance:   ID #:   Group #:  Pre-Cert / Auth required:  Pre-Cert / Auth #:   SCHEDULE INFORMATION: Procedure has been scheduled as follows:  Date:  07/14/2016             Time:  8:30 AM Location: [facility] Short Stay  This Gastroenterology Pre-Precedure Review Form is being routed to the following provider(s): Barney Drain, MD

## 2016-06-19 NOTE — Telephone Encounter (Signed)

## 2016-06-22 MED ORDER — NA SULFATE-K SULFATE-MG SULF 17.5-3.13-1.6 GM/177ML PO SOLN: 1.0000 | ORAL | 0 refills | Status: DC

## 2016-06-22 NOTE — Telephone Encounter (Signed)
Rx sent to the pharmacy and instructions mailed to pt.  

## 2016-07-10 ENCOUNTER — Telehealth: Payer: Self-pay

## 2016-07-10 NOTE — Telephone Encounter (Signed)
Pt needs help with her prep instructions. Please call her at [phone]

## 2016-07-10 NOTE — Telephone Encounter (Signed)
Reviewed instructions for the prep and pt expressed understanding.

## 2016-07-13 ENCOUNTER — Telehealth: Payer: Self-pay

## 2016-07-13 NOTE — Telephone Encounter (Signed)
I called [employer] @ [phone] and spoke to [place] . [place] [phone]  For the screening colonoscopy on 07/14/2016.

## 2016-07-14 ENCOUNTER — Ambulatory Visit (HOSPITAL_COMMUNITY)
Admission: RE | Admit: 2016-07-14 | Discharge: 2016-07-14 | Disposition: A | Discharge status: Home / Self Care | Payer: 59 | Source: Ambulatory Visit | Attending: Gastroenterology | Admitting: Gastroenterology

## 2016-07-14 ENCOUNTER — Encounter (HOSPITAL_COMMUNITY)
Admission: RE | Disposition: A | Discharge status: Home / Self Care | Payer: Self-pay | Source: Ambulatory Visit | Attending: Gastroenterology

## 2016-07-14 ENCOUNTER — Encounter (HOSPITAL_COMMUNITY): Payer: Self-pay | Admitting: *Deleted

## 2016-07-14 DIAGNOSIS — K644 Residual hemorrhoidal skin tags: Secondary | ICD-10-CM | POA: Insufficient documentation

## 2016-07-14 DIAGNOSIS — E669 Obesity, unspecified: Secondary | ICD-10-CM | POA: Insufficient documentation

## 2016-07-14 DIAGNOSIS — J45909 Unspecified asthma, uncomplicated: Secondary | ICD-10-CM | POA: Diagnosis not present

## 2016-07-14 DIAGNOSIS — E7800 Pure hypercholesterolemia, unspecified: Secondary | ICD-10-CM | POA: Insufficient documentation

## 2016-07-14 DIAGNOSIS — Z7982 Long term (current) use of aspirin: Secondary | ICD-10-CM | POA: Diagnosis not present

## 2016-07-14 DIAGNOSIS — M1990 Unspecified osteoarthritis, unspecified site: Secondary | ICD-10-CM | POA: Insufficient documentation

## 2016-07-14 DIAGNOSIS — I10 Essential (primary) hypertension: Secondary | ICD-10-CM | POA: Insufficient documentation

## 2016-07-14 DIAGNOSIS — Z1212 Encounter for screening for malignant neoplasm of rectum: Secondary | ICD-10-CM | POA: Diagnosis not present

## 2016-07-14 DIAGNOSIS — Z1211 Encounter for screening for malignant neoplasm of colon: Secondary | ICD-10-CM

## 2016-07-14 DIAGNOSIS — K5730 Diverticulosis of large intestine without perforation or abscess without bleeding: Secondary | ICD-10-CM | POA: Insufficient documentation

## 2016-07-14 DIAGNOSIS — Z87891 Personal history of nicotine dependence: Secondary | ICD-10-CM | POA: Diagnosis not present

## 2016-07-14 DIAGNOSIS — D123 Benign neoplasm of transverse colon: Secondary | ICD-10-CM | POA: Diagnosis not present

## 2016-07-14 DIAGNOSIS — K562 Volvulus: Secondary | ICD-10-CM | POA: Diagnosis not present

## 2016-07-14 DIAGNOSIS — E119 Type 2 diabetes mellitus without complications: Secondary | ICD-10-CM | POA: Diagnosis not present

## 2016-07-14 DIAGNOSIS — Z79899 Other long term (current) drug therapy: Secondary | ICD-10-CM | POA: Diagnosis not present

## 2016-07-14 DIAGNOSIS — K648 Other hemorrhoids: Secondary | ICD-10-CM | POA: Insufficient documentation

## 2016-07-14 DIAGNOSIS — Z8541 Personal history of malignant neoplasm of cervix uteri: Secondary | ICD-10-CM | POA: Diagnosis not present

## 2016-07-14 HISTORY — PX: POLYPECTOMY: SHX5525

## 2016-07-14 HISTORY — PX: COLONOSCOPY: SHX5424

## 2016-07-14 LAB — GLUCOSE, CAPILLARY: Glucose-Capillary: 149 mg/dL — ABNORMAL HIGH (ref 65–99)

## 2016-07-14 SURGERY — COLONOSCOPY
Anesthesia: Moderate Sedation

## 2016-07-14 MED ORDER — MIDAZOLAM HCL 5 MG/5ML IJ SOLN
INTRAMUSCULAR | Status: AC
  Filled 2016-07-14: qty 10

## 2016-07-14 MED ORDER — ONDANSETRON HCL 4 MG/2ML IJ SOLN
INTRAMUSCULAR | Status: DC | PRN
  Administered 2016-07-14: 4 mg via INTRAVENOUS

## 2016-07-14 MED ORDER — STERILE WATER FOR IRRIGATION IR SOLN
Status: DC | PRN
  Administered 2016-07-14: 2.5 mL

## 2016-07-14 MED ORDER — MEPERIDINE HCL 100 MG/ML IJ SOLN
INTRAMUSCULAR | Status: DC | PRN
  Administered 2016-07-14: 50 mg via INTRAVENOUS
  Administered 2016-07-14 (×2): 25 mg via INTRAVENOUS

## 2016-07-14 MED ORDER — MIDAZOLAM HCL 5 MG/5ML IJ SOLN
INTRAMUSCULAR | Status: DC | PRN
  Administered 2016-07-14: 2 mg via INTRAVENOUS
  Administered 2016-07-14: 1 mg via INTRAVENOUS
  Administered 2016-07-14: 2 mg via INTRAVENOUS

## 2016-07-14 MED ORDER — ONDANSETRON HCL 4 MG/2ML IJ SOLN
INTRAMUSCULAR | Status: AC
  Filled 2016-07-14: qty 2

## 2016-07-14 MED ORDER — SODIUM CHLORIDE 0.9 % IV SOLN
INTRAVENOUS | Status: DC
  Administered 2016-07-14: 1000 mL via INTRAVENOUS

## 2016-07-14 MED ORDER — MEPERIDINE HCL 100 MG/ML IJ SOLN
INTRAMUSCULAR | Status: AC
  Filled 2016-07-14: qty 2

## 2016-07-14 NOTE — H&P (Signed)
Primary Care Physician:  Mickie Hillier, MD Primary Gastroenterologist:  Dr. Oneida Alar  Pre-Procedure History & Physical: HPI:  [name] is a 61 y.o. female here for [place].  Past Medical History:  Diagnosis Date  . Arthritis    "fingers occasionally" (08/26/2014)  . Asthmatic bronchitis    "haven't used an inhaler in years" (08/26/2014)  . Cervical cancer ([place])   . Counseling for estrogen replacement therapy 12/30/2014  . Diabetes mellitus without complication ([place])    "borderline"  . Elevated cholesterol   . Exertional shortness of breath   . Family history of adverse reaction to anesthesia    "I think my mama gets sick"  . Hot flashes 07/05/2012  . Hypertension   . IFG (impaired fasting glucose)   . Mixed stress and urge urinary incontinence   . [place] 12/30/2014  . Obesity   . Panniculus   . PONV (postoperative nausea and vomiting)   . Rectocele 07/05/2012  . Stress incontinence   . Superficial fungus infection of skin     Past Surgical History:  Procedure Laterality Date  . ABDOMINAL HYSTERECTOMY  1986   "partial"  . ANKLE FUSION Left 10/24/2013   Procedure: LEFT ANKLE SUBTALAR AND TALONAVICULAR FUSION;  Surgeon: Newt Minion, MD;  Location: [facility];  Service: Orthopedics;  Laterality: Left;  . ANKLE FUSION Left 08/26/2014   Procedure: Left Foot Take Down Non-union with Revision Talonavicular and Subtalar Fusion;  Surgeon: Newt Minion, MD;  Location: [facility];  Service: Orthopedics;  Laterality: Left;  . BACK SURGERY    . BILATERAL SALPINGECTOMY  2010   w/LOA  . COLONOSCOPY    . FUSION OF TALONAVICULAR JOINT Left    Take Down Non-union with Revision Talonavicular and Subtalar Fusion /notes 08/26/2014  . HAMMER TOE SURGERY Bilateral 2000-2013   right-left  . JOINT REPLACEMENT    . KNEE ARTHROSCOPY Bilateral 2008-2010    left-right  . LUMBAR DISC SURGERY  2004   Lumbar 4- 5  . TOTAL KNEE ARTHROPLASTY Right 2013    Prior to Admission medications    Medication Sig Start Date End Date Taking? Authorizing Provider  aspirin 81 MG tablet Take 81 mg by mouth daily.   Yes Historical Provider, MD  DHEA 50 MG CAPS Take 50 mg by mouth daily.    Yes Historical Provider, MD  ibuprofen (ADVIL,MOTRIN) 200 MG tablet Take 600-800 mg by mouth every 6 (six) hours as needed for moderate pain.    Yes Historical Provider, MD  lisinopril-hydrochlorothiazide (PRINZIDE,ZESTORETIC) 10-12.5 MG tablet Take 1 tablet by mouth daily. 05/08/16  Yes Mikey Kirschner, MD  Na Sulfate-K Sulfate-Mg Sulf (SUPREP BOWEL PREP KIT) 17.5-3.13-1.6 GM/180ML SOLN Take 1 kit by mouth as directed. 06/22/16  Yes Danie Binder, MD  progesterone (PROMETRIUM) 100 MG capsule Take 100 mg by mouth at bedtime.    Yes Historical Provider, MD  simvastatin (ZOCOR) 40 MG tablet TAKE 1 TABLET (40 MG TOTAL) BY MOUTH DAILY AT 6 PM. Patient taking differently: Take 40 mg by mouth daily.  05/08/16  Yes Mikey Kirschner, MD  thyroid (ARMOUR) 30 MG tablet Take 60 mg by mouth daily before breakfast.    Yes Historical Provider, MD  ONE TOUCH ULTRA TEST test strip USE TO TEST BLOOD SUGAR DAILY 11/20/14   Mikey Kirschner, MD  [facility] DELICA LANCETS 16X MISC USE TO OBTAIN A BLOOD SPECIMEN TO TEST BLOOD SUGAR 11/20/14   Mikey Kirschner, MD    Allergies  as of 06/15/2016 - Review Complete 06/14/2016  Allergen Reaction Noted  . Naprosyn [naproxen] Nausea Only 07/23/2012    Family History  Problem Relation Age of Onset  . Heart attack Father   . Stroke Mother   . Diabetes Brother   . Hypertension Brother   . Diabetes Brother   . Hypertension Brother   . Diabetes Maternal Grandmother   . Cancer Maternal Aunt     cervical  . Diabetes Paternal Grandmother     Social History   Social History  . Marital status: Married    Spouse name: N/A  . Number of children: N/A  . Years of education: N/A   Occupational History  . Not on file.   Social History Main Topics  . Smoking status: Former Smoker     Packs/day: 0.50    Years: 10.00    Types: Cigarettes    Quit date: 03/27/1988  . Smokeless tobacco: Never Used  . Alcohol use Yes     Comment: occasionally a beer  . Drug use: No  . Sexual activity: No     Comment: hyst   Other Topics Concern  . Not on file   Social History Narrative  . No narrative on file    Review of Systems: See HPI, otherwise negative ROS   Physical Exam: BP 136/66   Pulse 70   Temp 97.7 F (36.5 C) (Oral)   Resp 18   Ht '5\' 3"'$  (1.6 m)   Wt 245 lb (111.1 kg)   SpO2 96%   BMI 43.40 kg/m  General:   Alert,  pleasant and cooperative in NAD Head:  Normocephalic and atraumatic. Neck:  Supple; Lungs:  Clear throughout to auscultation.    Heart:  Regular rate and rhythm. Abdomen:  Soft, nontender and nondistended. Normal bowel sounds, without guarding, and without rebound.   Neurologic:  Alert and  oriented x4;  grossly normal neurologically.  Impression/Plan:     SCREENING  Plan:  1. TCS TODAY. DISCUSSED PROCEDURE, BENEFITS, & RISKS: < 1% chance of medication reaction, bleeding, perforation, or rupture of spleen/liver.

## 2016-07-14 NOTE — Discharge Instructions (Signed)
You have lnternal and external hemorrhoids. YOU HAVE diverticulosis IN YOUR LEFT COLON. YOU HAD ONE SMALL POLYP REMOVED.    DRINK WATER TO KEEP YOUR URINE LIGHT YELLOW.  FOLLOW A HIGH FIBER DIET. AVOID ITEMS THAT CAUSE BLOATING. See info below. CONSIDER A PLANT BASED DIET-NO MEAT OR DAIRY FOR 6 MOS. AVOID ITEMS THAT CAUSE BLOATING & GAS. I RECOMMEND THE BOOK, "PREVENT AND REVERSE HEART DISEASE". CALDWELL ESSELTYN JR., MD. PAGE 120-121 CLEARLY STATES THE RULES AND QUICK AND EASY RECIPES FOR BREAKFAST, LUNCH, AND DINNER ARE AFTER P. 127.  CONTINUE YOUR WEIGHT LOSS EFFORTS. LOSE TEN POUNDS.  WHILE I DO NOT WANT TO ALARM YOU, YOUR BODY MASS INDEX IS OVER 40 WHICH MEANS YOU ARE MORBIDLY OBESE. OBESITY TURNS ON CANCER GENES. OBESITY IS ASSOCIATED WITH AN INCREASE RISK FOR ALL CANCERS, INCLUDING ESOPHAGEAL AND COLON CANCER.   USE PREPARATION H FOUR TIMES  A DAY IF NEEDED TO RELIEVE RECTAL PAIN/PRESSURE/BLEEDING.   YOUR BIOPSY RESULTS WILL BE AVAILABLE IN MY CHART  APR 24 AND MY OFFICE WILL CONTACT YOU IN 10-14 DAYS WITH YOUR RESULTS.   Next colonoscopy in 5-10 years.  Colonoscopy Care After Read the instructions outlined below and refer to this sheet in the next week. These discharge instructions provide you with general information on caring for yourself after you leave the hospital. While your treatment has been planned according to the most current medical practices available, unavoidable complications occasionally occur. If you have any problems or questions after discharge, call DR. Nyriah Coote, [phone].  ACTIVITY  You may resume your regular activity, but move at a slower pace for the next 24 hours.   Take frequent rest periods for the next 24 hours.   Walking will help get rid of the air and reduce the bloated feeling in your belly (abdomen).   No driving for 24 hours (because of the medicine (anesthesia) used during the test).   You may shower.   Do not sign any important legal  documents or operate any machinery for 24 hours (because of the anesthesia used during the test).    NUTRITION  Drink plenty of fluids.   You may resume your normal diet as instructed by your doctor.   Begin with a light meal and progress to your normal diet. Heavy or fried foods are harder to digest and may make you feel sick to your stomach (nauseated).   Avoid alcoholic beverages for 24 hours or as instructed.    MEDICATIONS  You may resume your normal medications.   WHAT YOU CAN EXPECT TODAY  Some feelings of bloating in the abdomen.   Passage of more gas than usual.   Spotting of blood in your stool or on the toilet paper  .  IF YOU HAD POLYPS REMOVED DURING THE COLONOSCOPY:  Eat a soft diet IF YOU HAVE NAUSEA, BLOATING, ABDOMINAL PAIN, OR VOMITING.    FINDING OUT THE RESULTS OF YOUR TEST Not all test results are available during your visit. DR. Oneida Alar WILL CALL YOU WITHIN 14 DAYS OF YOUR PROCEDUE WITH YOUR RESULTS. Do not assume everything is normal if you have not heard from DR. Isis Costanza, CALL HER OFFICE AT [phone].  SEEK IMMEDIATE MEDICAL ATTENTION AND CALL THE OFFICE: [phone] IF:  You have more than a spotting of blood in your stool.   Your belly is swollen (abdominal distention).   You are nauseated or vomiting.   You have a temperature over 101F.   You have abdominal pain or discomfort that  is severe or gets worse throughout the day.  High-Fiber Diet A high-fiber diet changes your normal diet to include more whole grains, legumes, fruits, and vegetables. Changes in the diet involve replacing refined carbohydrates with unrefined foods. The calorie level of the diet is essentially unchanged. The Dietary Reference Intake (recommended amount) for adult males is 38 grams per day. For adult females, it is 25 grams per day. Pregnant and lactating women should consume 28 grams of fiber per day. Fiber is the intact part of a plant that is not broken down  during digestion. Functional fiber is fiber that has been isolated from the plant to provide a beneficial effect in the body. PURPOSE  Increase stool bulk.   Ease and regulate bowel movements.   Lower cholesterol.  REDUCE RISK OF COLON CANCER  INDICATIONS THAT YOU NEED MORE FIBER  Constipation and hemorrhoids.   Uncomplicated diverticulosis (intestine condition) and irritable bowel syndrome.   Weight management.   As a protective measure against hardening of the arteries (atherosclerosis), diabetes, and cancer.   GUIDELINES FOR INCREASING FIBER IN THE DIET  Start adding fiber to the diet slowly. A gradual increase of about 5 more grams (2 slices of whole-wheat bread, 2 servings of most fruits or vegetables, or 1 bowl of high-fiber cereal) per day is best. Too rapid an increase in fiber may result in constipation, flatulence, and bloating.   Drink enough water and fluids to keep your urine clear or pale yellow. Water, juice, or caffeine-free drinks are recommended. Not drinking enough fluid may cause constipation.   Eat a variety of high-fiber foods rather than one type of fiber.   Try to increase your intake of fiber through using high-fiber foods rather than fiber pills or supplements that contain small amounts of fiber.   The goal is to change the types of food eaten. Do not supplement your present diet with high-fiber foods, but replace foods in your present diet.   INCLUDE A VARIETY OF FIBER SOURCES  Replace refined and processed grains with whole grains, canned fruits with fresh fruits, and incorporate other fiber sources. White rice, white breads, and most bakery goods contain little or no fiber.   Brown whole-grain rice, buckwheat oats, and many fruits and vegetables are all good sources of fiber. These include: broccoli, Brussels sprouts, cabbage, cauliflower, beets, sweet potatoes, white potatoes (skin on), carrots, tomatoes, eggplant, squash, berries, fresh fruits, and  dried fruits.   Cereals appear to be the richest source of fiber. Cereal fiber is found in whole grains and bran. Bran is the fiber-rich outer coat of cereal grain, which is largely removed in refining. In whole-grain cereals, the bran remains. In breakfast cereals, the largest amount of fiber is found in those with "bran" in their names. The fiber content is sometimes indicated on the label.   You may need to include additional fruits and vegetables each day.   In baking, for 1 cup white flour, you may use the following substitutions:   1 cup whole-wheat flour minus 2 tablespoons.   1/2 cup white flour plus 1/2 cup whole-wheat flour.   Polyps, Colon  A polyp is extra tissue that grows inside your body. Colon polyps grow in the large intestine. The large intestine, also called the colon, is part of your digestive system. It is a long, hollow tube at the end of your digestive tract where your body makes and stores stool. Most polyps are not dangerous. They are benign. This means they are  not cancerous. But over time, some types of polyps can turn into cancer. Polyps that are smaller than a pea are usually not harmful. But larger polyps could someday become or may already be cancerous. To be safe, doctors remove all polyps and test them.   PREVENTION There is not one sure way to prevent polyps. You might be able to lower your risk of getting them if you:  Eat more fruits and vegetables and less fatty food.   Do not smoke.   Avoid alcohol.   Exercise every day.   Lose weight if you are overweight.   Eating more calcium and folate can also lower your risk of getting polyps. Some foods that are rich in calcium are milk, cheese, and broccoli. Some foods that are rich in folate are chickpeas, kidney beans, and spinach.    Diverticulosis Diverticulosis is a common condition that develops when small pouches (diverticula) form in the wall of the colon. The risk of diverticulosis increases with  age. It happens more often in people who eat a low-fiber diet. Most individuals with diverticulosis have no symptoms. Those individuals with symptoms usually experience belly (abdominal) pain, constipation, or loose stools (diarrhea).  HOME CARE INSTRUCTIONS  Increase the amount of fiber in your diet as directed by your caregiver or dietician. This may reduce symptoms of diverticulosis.   Drink at least 6 to 8 glasses of water each day to prevent constipation.   Try not to strain when you have a bowel movement.   Avoiding nuts and seeds to prevent complications is NOT NECESSARY.     FOODS HAVING HIGH FIBER CONTENT INCLUDE:  Fruits. Apple, peach, pear, tangerine, raisins, prunes.   Vegetables. Brussels sprouts, asparagus, broccoli, cabbage, carrot, cauliflower, romaine lettuce, spinach, summer squash, tomato, winter squash, zucchini.   Starchy Vegetables. Baked beans, kidney beans, lima beans, split peas, lentils, potatoes (with skin).   Grains. Whole wheat bread, brown rice, bran flake cereal, plain oatmeal, white rice, shredded wheat, bran muffins.    SEEK IMMEDIATE MEDICAL CARE IF:  You develop increasing pain or severe bloating.   You have an oral temperature above 101F.   You develop vomiting or bowel movements that are bloody or black.   Hemorrhoids Hemorrhoids are dilated (enlarged) veins around the rectum. Sometimes clots will form in the veins. This makes them swollen and painful. These are called thrombosed hemorrhoids. Causes of hemorrhoids include:  Constipation.   Straining to have a bowel movement.   HEAVY LIFTING  HOME CARE INSTRUCTIONS  Eat a well balanced diet and drink 6 to 8 glasses of water every day to avoid constipation. You may also use a bulk laxative.   Avoid straining to have bowel movements.   Keep anal area dry and clean.   Do not use a donut shaped pillow or sit on the toilet for long periods. This increases blood pooling and pain.    Move your bowels when your body has the urge; this will require less straining and will decrease pain and pressure.

## 2016-07-14 NOTE — Op Note (Signed)
[facility] Patient Name: [name] Procedure Date: 07/14/2016 7:18 AM MRN: [id number] Date of Birth: June 17, 1955 Attending MD: Barney Drain , MD CSN: [id number] Age: 61 Admit Type: Outpatient Procedure:                Colonoscopy WITH COLD SNARE POLYPECTOMY Indications:              Screening for colorectal malignant neoplasm Providers:                Barney Drain, MD, Lurline Del, RN, Janeece Riggers, RN,                            Jeralyn Bennett RN, RN Referring MD:             Rosemary Holms, MD Medicines:                Ondansetron 4 mg IV, Meperidine 100 mg IV,                            Midazolam 5 mg IV Complications:            No immediate complications. Estimated Blood Loss:     Estimated blood loss was minimal. Procedure:                Pre-Anesthesia Assessment:                           - Prior to the procedure, a History and Physical                            was performed, and patient medications and                            allergies were reviewed. The patient's tolerance of                            previous anesthesia was also reviewed. The risks                            and benefits of the procedure and the sedation                            options and risks were discussed with the patient.                            All questions were answered, and informed consent                            was obtained. Prior Anticoagulants: The patient has                            taken aspirin. ASA Grade Assessment: II - A patient                            with mild systemic disease. After reviewing the  risks and benefits, the patient was deemed in                            satisfactory condition to undergo the procedure.                            After obtaining informed consent, the colonoscope                            was passed under direct vision. Throughout the                            procedure, the patient's blood pressure,  pulse, and                            oxygen saturations were monitored continuously. The                            EC-[id number] ([id number]) scope was introduced through                            the anus and advanced to the the cecum, identified                            by appendiceal orifice and ileocecal valve. The                            colonoscopy was technically difficult and complex                            due to significant looping and the patient's                            agitation. Successful completion of the procedure                            was aided by increasing the dose of sedation                            medication, straightening and shortening the scope                            to obtain bowel loop reduction, applying abdominal                            pressure and COLOWRAP. The patient tolerated the                            procedure fairly well. The quality of the bowel                            preparation was excellent. The ileocecal valve,  appendiceal orifice, and rectum were photographed. Scope In: 8:57:05 AM Scope Out: 9:15:34 AM Total Procedure Duration: 0 hours 18 minutes 29 seconds  Findings:      A 6 mm polyp was found in the hepatic flexure. The polyp was sessile.       The polyp was removed with a cold snare. Resection and retrieval were       complete.      Many small and large-mouthed diverticula were found in the recto-sigmoid       colon and sigmoid colon.      The recto-sigmoid colon, sigmoid colon and descending colon were       moderately redundant.      External and internal hemorrhoids were found during retroflexion. Impression:               - One 6 mm polyp at the hepatic flexure, removed                            with a cold snare.                           - Diverticulosis in the recto-sigmoid colon and in                            the sigmoid colon.                           - Redundant LEFT  colon.                           - External and internal hemorrhoids. Moderate Sedation:      Moderate (conscious) sedation was administered by the endoscopy nurse       and supervised by the endoscopist. The following parameters were       monitored: oxygen saturation, heart rate, blood pressure, and response       to care. Total physician intraservice time was 26 minutes. Recommendation:           - Repeat colonoscopy in 5-10 years for surveillance.                           - High fiber diet.                           - Continue present medications.                           - Await pathology results.                           - Patient has a contact number available for                            emergencies. The signs and symptoms of potential                            delayed complications were discussed with the  patient. Return to normal activities tomorrow.                            Written discharge instructions were provided to the                            patient. Procedure Code(s):        --- Professional ---                           [phone], Colonoscopy, flexible; with removal of                            tumor(s), polyp(s), or other lesion(s) by snare                            technique                           99152, Moderate sedation services provided by the                            same physician or other qualified health care                            professional performing the diagnostic or                            therapeutic service that the sedation supports,                            requiring the presence of an independent trained                            observer to assist in the monitoring of the                            patient's level of consciousness and physiological                            status; initial 15 minutes of intraservice time,                            patient age 27 years or older                            [phone], Moderate sedation services; each additional                            15 minutes intraservice time Diagnosis Code(s):        --- Professional ---                           Z12.11, Encounter for screening for malignant  neoplasm of colon                           D12.3, Benign neoplasm of transverse colon (hepatic                            flexure or splenic flexure)                           K64.8, Other hemorrhoids                           K57.30, Diverticulosis of large intestine without                            perforation or abscess without bleeding                           Q43.8, Other specified congenital malformations of                            intestine CPT copyright 2016 [employer]. All rights reserved. The codes documented in this report are preliminary and upon coder review may  be revised to meet current compliance requirements. Barney Drain, MD Barney Drain, MD 07/14/2016 10:05:35 AM This report has been signed electronically. Number of Addenda: 0

## 2016-07-21 ENCOUNTER — Encounter (HOSPITAL_COMMUNITY): Payer: Self-pay | Admitting: Gastroenterology

## 2016-07-21 DIAGNOSIS — Z029 Encounter for administrative examinations, unspecified: Secondary | ICD-10-CM

## 2016-07-27 ENCOUNTER — Ambulatory Visit (INDEPENDENT_AMBULATORY_CARE_PROVIDER_SITE_OTHER): Payer: 59 | Admitting: Orthopedic Surgery

## 2016-08-03 ENCOUNTER — Ambulatory Visit (INDEPENDENT_AMBULATORY_CARE_PROVIDER_SITE_OTHER): Payer: 59 | Admitting: Orthopedic Surgery

## 2016-08-14 ENCOUNTER — Ambulatory Visit (INDEPENDENT_AMBULATORY_CARE_PROVIDER_SITE_OTHER): Payer: 59

## 2016-08-14 ENCOUNTER — Ambulatory Visit (INDEPENDENT_AMBULATORY_CARE_PROVIDER_SITE_OTHER): Payer: 59 | Admitting: Orthopedic Surgery

## 2016-08-14 ENCOUNTER — Encounter (INDEPENDENT_AMBULATORY_CARE_PROVIDER_SITE_OTHER): Payer: Self-pay | Admitting: Orthopedic Surgery

## 2016-08-14 VITALS — Ht 63.0 in | Wt 245.0 lb

## 2016-08-14 DIAGNOSIS — M79672 Pain in left foot: Secondary | ICD-10-CM

## 2016-08-14 DIAGNOSIS — M79671 Pain in right foot: Secondary | ICD-10-CM

## 2016-08-14 DIAGNOSIS — Z981 Arthrodesis status: Secondary | ICD-10-CM

## 2016-08-14 NOTE — Progress Notes (Signed)
Office Visit Note   Patient: [name]           Date of Birth: [birth year]           MRN: [id number] Visit Date: 08/14/2016              Requested by: Mikey Kirschner, [address] [place], [place] [address] PCP: Mikey Kirschner, MD  Chief Complaint  Patient presents with  . Right Foot - Pain    Ongoing pain x 1 month, patient questions if she has a fallen arch.  . Left Foot - Edema    3 prior surgeries, left ankle and subtalar and talonavicular fusion, left foot take down nonunion removal deep retained hardware, left ankle scop with debridement of synovitis and osteochondral defects of talar dome.       HPI:  patient is a 61 year old woman who is year status post left ankle arthroscopy and debridement. She is also status post revision of talonavicular and subtalar fusion on the left. Agents states she's having pain on the arch on the right foot and pain across the midfoot on the left.  Assessment & Plan: Visit Diagnoses:  1. Pain in right foot   2. Pain in left foot   3. S/P ankle fusion     Plan:  Recommended a stiff soled walking shoe such as a new balance walking sneaker with sole orthotics. I feel this should resolve most of her problems with the midfoot arthritis and arch pain on the right.  Follow-Up Instructions: Return if symptoms worsen or fail to improve.   Ortho Exam  Patient is alert, oriented, no adenopathy, well-dressed, normal affect, normal respiratory effort.  examination patient has a normal gait. She has pain to palpation beneath the arch of the right foot. The posterior tibial tendon is nontender to palpation peroneal tendons are nontender to palpation she has good inversion strength and no weakness of the posterior tibial tendon. She has no tenderness to palpation over the origin of the plantar fascia. Eversion is nonpainful. She does have some tenderness to palpation through the midfoot. Examination the left foot she has no pain with  range of motion the ankle she is tender to palpation across the midfoot plantar fascia posterior tibial tendon nontender to palpation.  Imaging: Xr Ankle 2 Views Left  Result Date: 08/14/2016  Two-view radiographs of the left ankle show some joint space narrowing medially of the tibial talar joint. There is stable alignment of the subtalar and talonavicular fusion, kidney features no hardware failure.  Xr Foot 2 Views Left  Result Date: 08/14/2016  2 view radiographs of the left foot shows stable internal fixation and talonavicular fusion there is also some arthritic changes through the medial cuneiform navicular joint metatarsal cascade is well aligned with no stress fractures. No hardware failure.  Xr Foot 2 Views Right  Result Date: 08/14/2016  Two-view radiographs the right foot shows some arthritic changes of the medial cuneiform navicular joint no abnormalities through the metatarsals no stress fractures.   Labs: Lab Results  Component Value Date   HGBA1C 6.3 (H) 05/13/2016   HGBA1C 6.2 (H) 07/10/2015   HGBA1C 6.4 04/14/2015    Orders:  Orders Placed This Encounter  Procedures  . XR Ankle 2 Views Left  . XR Foot 2 Views Left  . XR Foot 2 Views Right   No orders of the defined types were placed in this encounter.    Procedures: No procedures performed  Clinical Data: No additional findings.  ROS:  All other systems negative, except as noted in the HPI. Review of Systems  Objective: Vital Signs: Ht 5\' 3"  (1.6 m)   Wt 245 lb (111.1 kg)   BMI 43.40 kg/m   Specialty Comments:  No specialty comments available.  PMFS History: Patient Active Problem List   Diagnosis Date Noted  . Special screening for malignant neoplasms, colon   . Moody 12/30/2014  . Counseling for estrogen replacement therapy 12/30/2014  . 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 Mixed stress and urge urinary incontinence 02/10/2014  . Diabetes ([facility]) 01/18/2014  . Posterior tibialis  muscle dysfunction 10/24/2013  . Hyperlipidemia LDL goal <100 03/10/2013  . Superficial fungus infection of skin 07/05/2012  . Hot flashes 07/05/2012  . Rectocele 07/05/2012  . HTN (hypertension) 07/04/2012  . Hx of cervical cancer 07/04/2012  . Stiffness of joint, lower leg, left 08/28/2011  . Difficulty in walking(719.7) 08/28/2011  . Pain in left knee 08/28/2011   Past Medical History:  Diagnosis Date  . Arthritis    "fingers occasionally" (08/26/2014)  . Asthmatic bronchitis    "haven't used an inhaler in years" (08/26/2014)  . Cervical cancer ([facility])   . Counseling for estrogen replacement therapy 12/30/2014  . Diabetes mellitus without complication ([facility])    "borderline"  . Elevated cholesterol   . Exertional shortness of breath   . Family history of adverse reaction to anesthesia    "I think my mama gets sick"  . Hot flashes 07/05/2012  . Hypertension   . IFG (impaired fasting glucose)   . Mixed stress and urge urinary incontinence   . Moody 12/30/2014  . Obesity   . Panniculus   . PONV (postoperative nausea and vomiting)   . Rectocele 07/05/2012  . Stress incontinence   . Superficial fungus infection of skin     Family History  Problem Relation Age of Onset  . Heart attack Father   . Stroke Mother   . Diabetes Brother   . Hypertension Brother   . Diabetes Brother   . Hypertension Brother   . Diabetes Maternal Grandmother   . Cancer Maternal Aunt        cervical  . Diabetes Paternal Grandmother     Past Surgical History:  Procedure Laterality Date  . ABDOMINAL HYSTERECTOMY  1986   "partial"  . ANKLE FUSION Left 10/24/2013   Procedure: LEFT ANKLE SUBTALAR AND TALONAVICULAR FUSION;  Surgeon: Newt Minion, MD;  Location: [facility];  Service: Orthopedics;  Laterality: Left;  . ANKLE FUSION Left 08/26/2014   Procedure: Left Foot Take Down Non-union with Revision Talonavicular and Subtalar Fusion;  Surgeon: Newt Minion, MD;  Location: [facility];  Service: Orthopedics;   Laterality: Left;  . BACK SURGERY    . BILATERAL SALPINGECTOMY  2010   w/LOA  . COLONOSCOPY    . COLONOSCOPY N/A 07/14/2016   Procedure: COLONOSCOPY;  Surgeon: Danie Binder, MD;  [facility];  Service: Endoscopy;  Laterality: N/A;  8:30  . FUSION OF TALONAVICULAR JOINT Left    Take Down Non-union with Revision Talonavicular and Subtalar Fusion /notes 08/26/2014  . HAMMER TOE SURGERY Bilateral 2000-2013   right-left  . JOINT REPLACEMENT    . KNEE ARTHROSCOPY Bilateral 2008-2010    left-right  . LUMBAR DISC SURGERY  2004   Lumbar 4- 5  . POLYPECTOMY  07/14/2016   Procedure: POLYPECTOMY;  Surgeon: Danie Binder, MD;  [facility];  Service: Endoscopy;;  hepatic flexure  . TOTAL KNEE ARTHROPLASTY Right 2013   Social History   Occupational History  . Not on file.   Social History Main Topics  . Smoking status: Former Smoker    Packs/day: 0.50    Years: 10.00    Types: Cigarettes    Quit date: 03/27/1988  . Smokeless tobacco: Never Used  . Alcohol use Yes     Comment: occasionally a beer  . Drug use: No  . Sexual activity: No     Comment: hyst

## 2016-08-22 DIAGNOSIS — Z029 Encounter for administrative examinations, unspecified: Secondary | ICD-10-CM

## 2016-08-28 ENCOUNTER — Other Ambulatory Visit: Payer: Self-pay | Admitting: *Deleted

## 2016-08-28 MED ORDER — MIRABEGRON ER 25 MG PO TB24: 25.0000 mg | ORAL_TABLET | Freq: Every day | ORAL | 3 refills | Status: DC

## 2016-09-04 IMAGING — CT CT ANKLE*L* W/O CM
1 series · 12 of 14 positions shown, 15 images · non-contrast
Comparison: 09/12/2013

CLINICAL DATA: Chronic left lateral ankle and foot pain and
swelling. Subtalar fusion, query nonunion. If none tear of the
tibialis posterior tendon.

EXAM:
CT OF THE LEFT ANKLE WITHOUT CONTRAST
TECHNIQUE: Multidetector CT imaging of the left ankle was performed according
to the standard protocol. Multiplanar CT image reconstructions were
also generated.

[Series 6: lt ankle st 3.0 b41s · axial · 0.33mm/px · z∈[+11,+179]mm · 12 of 68 slices shown, 15 images]
[im 6/68  soft-tissue]
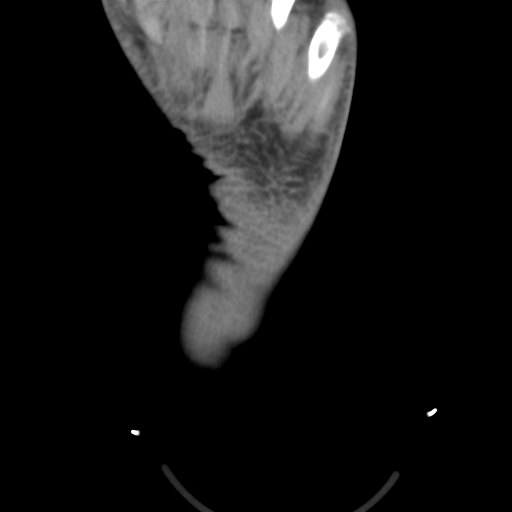
[im 6/68  bone]
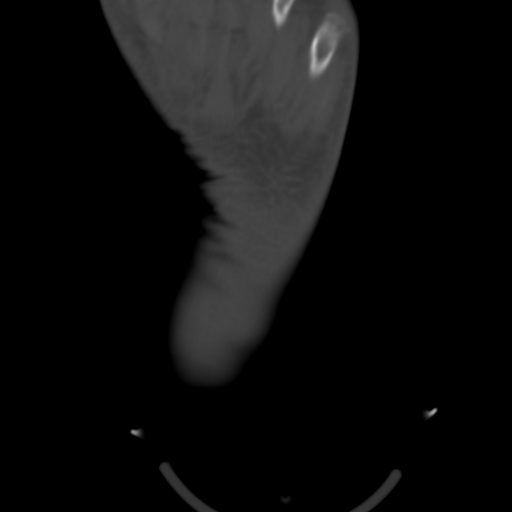
[im 11/68  bone]
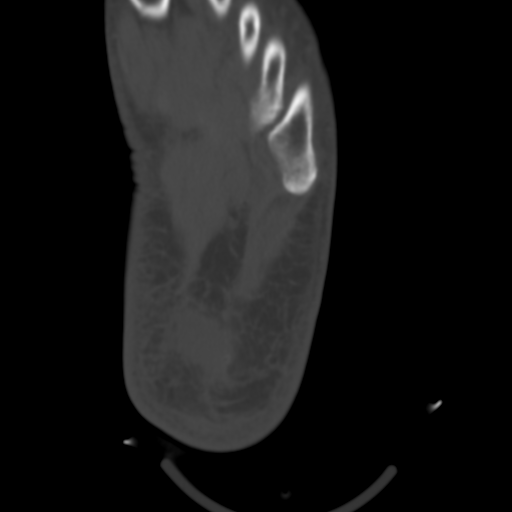
[im 16/68  bone]
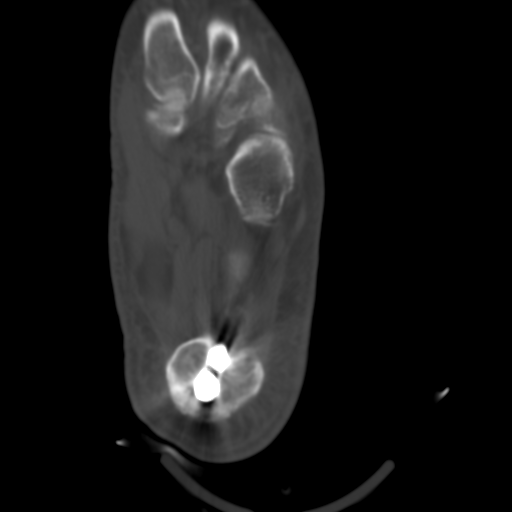
[im 21/68  bone]
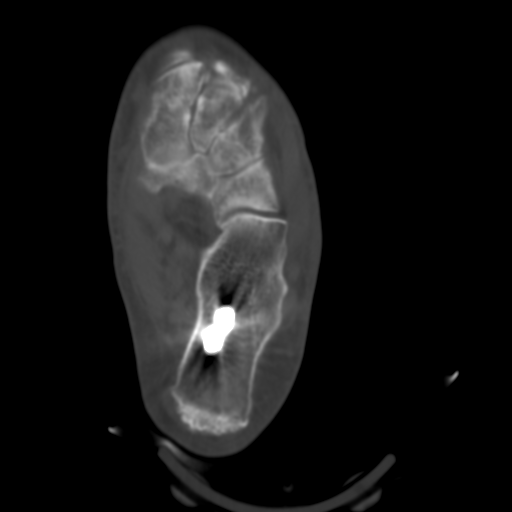
[im 26/68  soft-tissue]
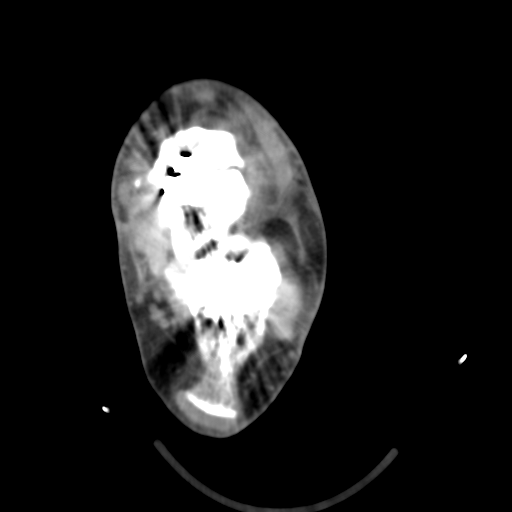
[im 26/68  bone]
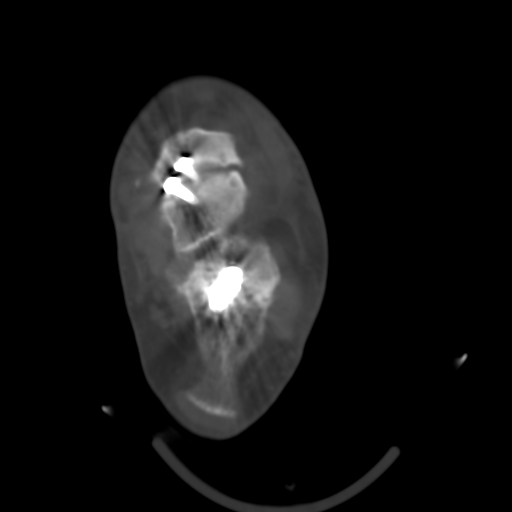
[im 31/68  bone]
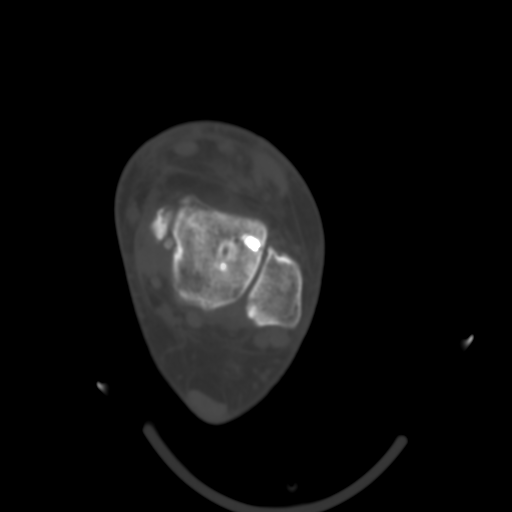
[im 37/68  bone]
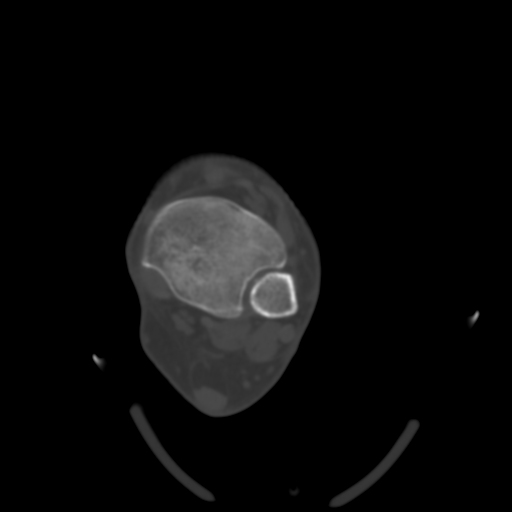
[im 42/68  bone]
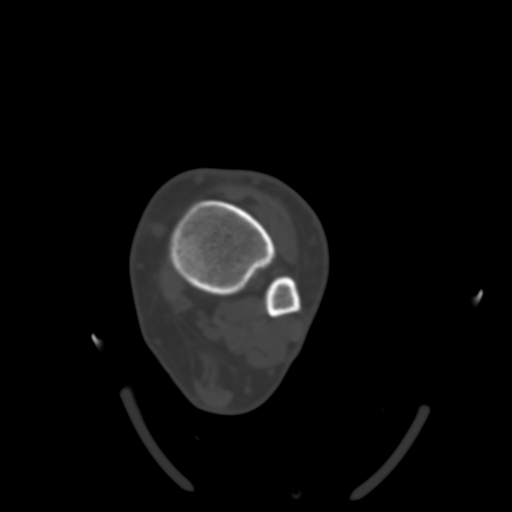
[im 47/68  soft-tissue]
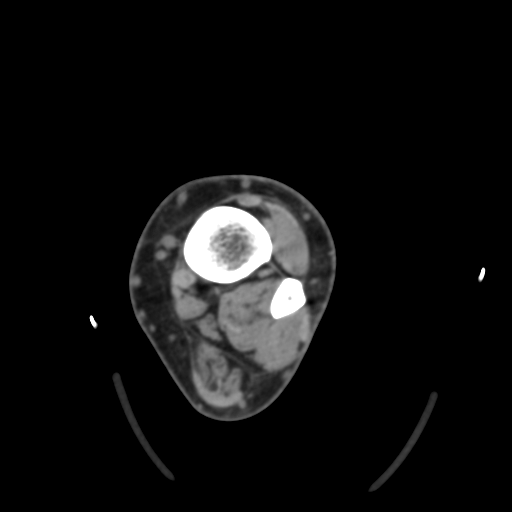
[im 47/68  bone]
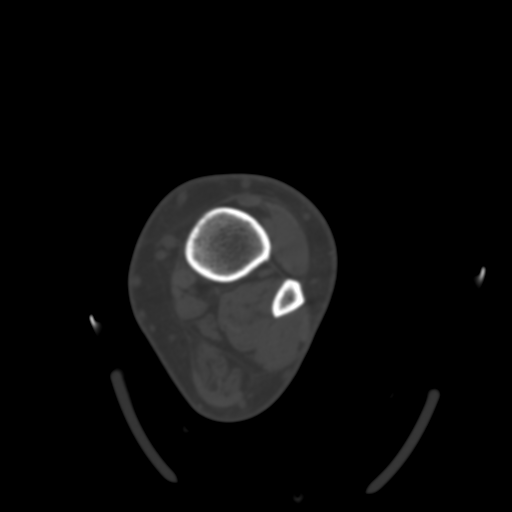
[im 52/68  bone]
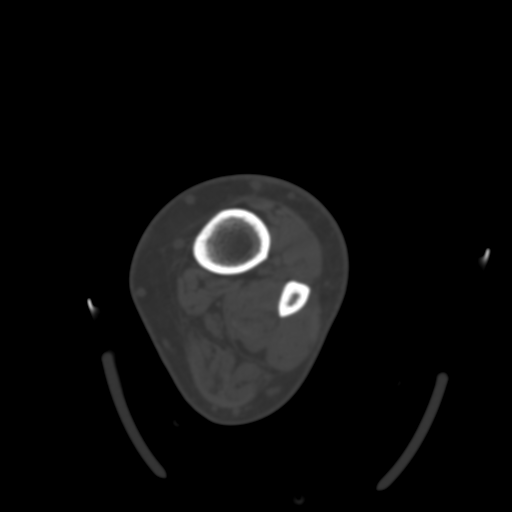
[im 57/68  bone]
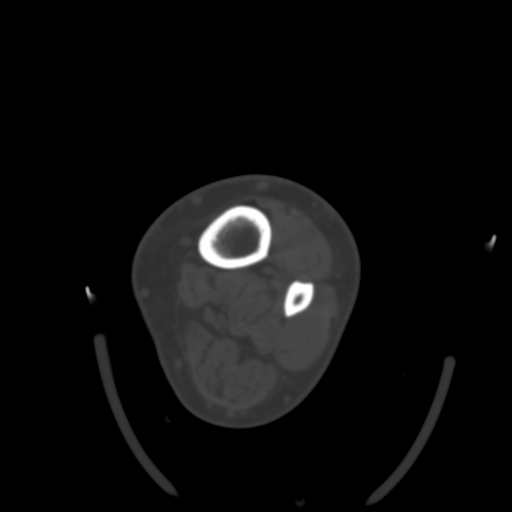
[im 62/68  bone]
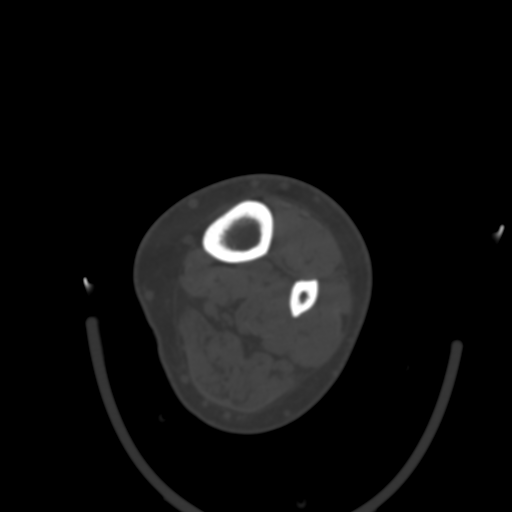

[12 of 14 positions shown; findings below may reference images not displayed]

FINDINGS: Two cannulated lag screws extend from the posterior inferior
calcaneus into the talus. The proximal threading of the lag screws
may traverse the joint rather than lag the joint. There is reduced
articular space along the posterior subtalar facet but no bony
union. There is reduced articular space in the middle and anterior
subtalar facets. Guide pin track observed on image 22 series 603
with capture of talar dome and tibial plafond cortex.

Two retrograde screws are present in the anterior talus. The heads
of the screws appear to be within an irregular and subluxed
talonavicular joint. Screws extend towards but do not reach the
talar dome. The navicular appears subluxed about 6 mm superiorly
with respect to the anterior talus, with sclerosis in both the
navicular and adjacent talus.

Aside from mild spurring at the calcaneocuboid joint, the
calcaneocuboid joint appears unremarkable. Adjacent os peroneus
noted.

No malalignment at the Lisfranc joint. There is mild degenerative
spurring at the Lisfranc joint.

Thickening of the medial band of the plantar fascia with adjacent
edema in the subcutaneous tissues favoring plantar fasciitis.
IMPRESSION: 1. No bony bridging at the fused posterior subtalar facet despite
the cannulated screws. Threading on the cannulated screws may
traverse the articulation.
2. Abnormally and irregular talonavicular articulation with dorsal
subluxation of the navicular by 6 mm with respect to the talus.
There is probably been some subsidence of the proximal navicular,
with the heads of the anterior fixation screws now in the
talonavicular joint.
3. Plantar fasciitis.

## 2016-09-18 ENCOUNTER — Telehealth: Payer: Self-pay | Admitting: Family Medicine

## 2016-09-18 DIAGNOSIS — E119 Type 2 diabetes mellitus without complications: Secondary | ICD-10-CM

## 2016-09-18 DIAGNOSIS — M791 Myalgia, unspecified site: Secondary | ICD-10-CM

## 2016-09-18 DIAGNOSIS — I10 Essential (primary) hypertension: Secondary | ICD-10-CM

## 2016-09-18 DIAGNOSIS — E785 Hyperlipidemia, unspecified: Secondary | ICD-10-CM

## 2016-09-18 NOTE — Telephone Encounter (Signed)
Plus A1c, would also rec o v after b w finished, sched in one wk with carolyn

## 2016-09-18 NOTE — Telephone Encounter (Signed)
Spoke with patient and informed her per Dr.Steve Luking - labs have been ordered. Patient verbalized understanding. FYI patient had already scheduled appointment prior to message being taken with you on 10/02/16

## 2016-09-18 NOTE — Telephone Encounter (Signed)
Lip liv m7 tsh esr

## 2016-09-18 NOTE — Telephone Encounter (Signed)
Patient is requesting order for labs.  She said she has been dealing with body aches, hip and back pain down to her toes for a while.  Also, she would like to have her thyroid checked.

## 2016-09-27 LAB — BASIC METABOLIC PANEL
BUN/Creatinine Ratio: 22 (ref 12–28)
BUN: 19 mg/dL (ref 8–27)
CHLORIDE: 101 mmol/L (ref 96–106)
CO2: 25 mmol/L (ref 20–29)
Calcium: 9.5 mg/dL (ref 8.7–10.3)
Creatinine, Ser: 0.88 mg/dL (ref 0.57–1.00)
GFR calc Af Amer: 83 mL/min/{1.73_m2} (ref >59)
GFR calc non Af Amer: 72 mL/min/{1.73_m2} (ref >59)
GLUCOSE: 133 mg/dL — AB (ref 65–99)
POTASSIUM: 4.6 mmol/L (ref 3.5–5.2)
Sodium: 139 mmol/L (ref 134–144)

## 2016-09-27 LAB — LIPID PANEL
CHOLESTEROL TOTAL: 166 mg/dL (ref 100–199)
Chol/HDL Ratio: 3.4 ratio (ref 0.0–4.4)
HDL: 49 mg/dL (ref >39)
LDL Calculated: 96 mg/dL (ref 0–99)
Triglycerides: 104 mg/dL (ref 0–149)
VLDL CHOLESTEROL CAL: 21 mg/dL (ref 5–40)

## 2016-09-27 LAB — HEPATIC FUNCTION PANEL
ALT: 15 IU/L (ref 0–32)
AST: 19 IU/L (ref 0–40)
Albumin: 4.4 g/dL (ref 3.6–4.8)
Alkaline Phosphatase: 65 IU/L (ref 39–117)
Bilirubin Total: 0.3 mg/dL (ref 0.0–1.2)
Bilirubin, Direct: 0.1 mg/dL (ref 0.00–0.40)
Total Protein: 7.2 g/dL (ref 6.0–8.5)

## 2016-09-27 LAB — TSH: TSH: 1.8 u[IU]/mL (ref 0.450–4.500)

## 2016-09-27 LAB — HEMOGLOBIN A1C
Est. average glucose Bld gHb Est-mCnc: 140 mg/dL
Hgb A1c MFr Bld: 6.5 % — ABNORMAL HIGH (ref 4.8–5.6)

## 2016-09-27 LAB — SEDIMENTATION RATE: SED RATE: 13 mm/h (ref 0–40)

## 2016-10-02 ENCOUNTER — Ambulatory Visit (INDEPENDENT_AMBULATORY_CARE_PROVIDER_SITE_OTHER): Payer: 59 | Admitting: Family Medicine

## 2016-10-02 ENCOUNTER — Encounter: Payer: Self-pay | Admitting: Family Medicine

## 2016-10-02 VITALS — BP 120/84 | Temp 98.7°F | Ht 63.0 in | Wt 251.0 lb

## 2016-10-02 DIAGNOSIS — I10 Essential (primary) hypertension: Secondary | ICD-10-CM | POA: Diagnosis not present

## 2016-10-02 DIAGNOSIS — M545 Low back pain, unspecified: Secondary | ICD-10-CM

## 2016-10-02 DIAGNOSIS — E785 Hyperlipidemia, unspecified: Secondary | ICD-10-CM | POA: Diagnosis not present

## 2016-10-02 DIAGNOSIS — G8929 Other chronic pain: Secondary | ICD-10-CM

## 2016-10-02 DIAGNOSIS — E119 Type 2 diabetes mellitus without complications: Secondary | ICD-10-CM

## 2016-10-02 MED ORDER — LISINOPRIL-HYDROCHLOROTHIAZIDE 10-12.5 MG PO TABS: 1.0000 | ORAL_TABLET | Freq: Every day | ORAL | 1 refills | Status: DC

## 2016-10-02 MED ORDER — ONETOUCH DELICA LANCETS 33G MISC: 1 refills | Status: DC

## 2016-10-02 MED ORDER — GLUCOSE BLOOD VI STRP: ORAL_STRIP | 1 refills | Status: DC

## 2016-10-02 MED ORDER — SIMVASTATIN 40 MG PO TABS: ORAL_TABLET | ORAL | 1 refills | Status: DC

## 2016-10-02 NOTE — Patient Instructions (Signed)

## 2016-10-02 NOTE — Progress Notes (Signed)
Subjective:    Patient ID: [name], female    DOB: 1955-07-11, 61 y.o.   MRN: [id number] Patient arrives to the office with a phenomenal number of concerns HPI:No energy, tired, hurts in lower back radiates down to toes. Hx of back surg long time ago  Pt was helping mo a lot the past yr and bac started hurting  Saw chiro, toldL4 and L 5 was narow, and, of course, spine was curved  chiro care heoped some  Hip and leg  Not walking, tries to stay active  ibu takes 1000 ng bid prn  Not doing a lot of execises of lw bk these days   Running tied the past   No other complaints. Results for orders placed or performed in visit on 09/18/16  TSH  Result Value Ref Range   TSH 1.800 0.450 - 4.500 uIU/mL  Hepatic function panel  Result Value Ref Range   Total Protein 7.2 6.0 - 8.5 g/dL   Albumin 4.4 3.6 - 4.8 g/dL   Bilirubin Total 0.3 0.0 - 1.2 mg/dL   Bilirubin, Direct 0.10 0.00 - 0.40 mg/dL   Alkaline Phosphatase 65 39 - 117 IU/L   AST 19 0 - 40 IU/L   ALT 15 0 - 32 IU/L  Basic metabolic panel  Result Value Ref Range   Glucose 133 (H) 65 - 99 mg/dL   BUN 19 8 - 27 mg/dL   Creatinine, Ser 0.88 0.57 - 1.00 mg/dL   GFR calc non Af Amer 72 >59 mL/min/1.73   GFR calc Af Amer 83 >59 mL/min/1.73   BUN/Creatinine Ratio 22 12 - 28   Sodium 139 134 - 144 mmol/L   Potassium 4.6 3.5 - 5.2 mmol/L   Chloride 101 96 - 106 mmol/L   CO2 25 20 - 29 mmol/L   Calcium 9.5 8.7 - 10.3 mg/dL  Sed Rate (ESR)  Result Value Ref Range   Sed Rate 13 0 - 40 mm/hr  Hemoglobin A1C  Result Value Ref Range   Hgb A1c MFr Bld 6.5 (H) 4.8 - 5.6 %   Est. average glucose Bld gHb Est-mCnc 140 mg/dL  Lipid panel  Result Value Ref Range   Cholesterol, Total 166 100 - 199 mg/dL   Triglycerides 104 0 - 149 mg/dL   HDL 49 >39 mg/dL   VLDL Cholesterol Cal 21 5 - 40 mg/dL   LDL Calculated 96 0 - 99 mg/dL   Chol/HDL Ratio 3.4 0.0 - 4.4 ratio   Patient claims compliance with diabetes medication. No  obvious side effects. Reports no substantial low sugar spells. Most numbers are generally in good range when checked fasting. Generally does not miss a dose of medication. Watching diabetic diet closely  Blood pressure medicine and blood pressure levels reviewed today with patient. Compliant with blood pressure medicine. States does not miss a dose. No obvious side effects. Blood pressure generally good when checked elsewhere. Watching salt intake.   Patient continues to take lipid medication regularly. No obvious side effects from it. Generally does not miss a dose. Prior blood work results are reviewed with patient. Patient continues to work on fat intake in diet  Patient notes general fatigue. Has had diminished energy for a while. Feeling down at times. Particularly worse last few months. Stated her ailing mother's house for 6 months until she passed away back earlier in the spring. Feels down no suicidal thoughts no homicidal thoughts. States having still good relationship with her husband  Back pain diffuse in nature low back radiation in both hips worse with movement. First felt early in the morning. Has had chiropractic care which helped some but not a lot.  Frustrated by weight. Currently on thyroid supplement. Wonders if current dose is sufficient.    Review of Systems No headache, no major weight loss or weight gain, no chest pain no back pain abdominal pain no change in bowel habits complete ROS otherwise negative     Objective:   Physical Exam Alert and oriented, vitals reviewed and stable, NAD ENT-TM's and ext canals WNL bilat via otoscopic exam Soft palate, tonsils and post pharynx WNL via oropharyngeal exam Neck-symmetric, no masses; thyroid nonpalpable and nontender Pulmonary-no tachypnea or accessory muscle use; Clear without wheezes via auscultation Card--no abnrml murmurs, rhythm reg and rate WNL Carotid pulses symmetric, without bruits        Assessment & Plan:    Impression 1 hypertension good control discussed maintain same meds #2 type 2 diabetes discussed maintain same approach #3 hyperlipidemia currently controlled good discuss maintain same #4 hypothyroidism TSH and I slow discussed #5 weight gain multi-factorial weight diet discussed #6 low back pain chronic likely musculoskeletal. Doubt neuropathic. Rationale discussed. #7 depression element of grief patient declines medicine think that's reasonable excise will hopefully help.  Greater than 50% of this 40 minute face to face visit was spent in counseling and discussion and coordination of care regarding the above diagnosis/diagnosies

## 2016-10-13 ENCOUNTER — Other Ambulatory Visit: Payer: Self-pay | Admitting: Family Medicine

## 2016-10-13 DIAGNOSIS — Z1231 Encounter for screening mammogram for malignant neoplasm of breast: Secondary | ICD-10-CM

## 2016-10-19 ENCOUNTER — Ambulatory Visit (HOSPITAL_COMMUNITY)
Admission: RE | Admit: 2016-10-19 | Discharge: 2016-10-19 | Disposition: A | Discharge status: Home / Self Care | Payer: 59 | Source: Ambulatory Visit | Attending: Family Medicine | Admitting: Family Medicine

## 2016-10-19 DIAGNOSIS — Z1231 Encounter for screening mammogram for malignant neoplasm of breast: Secondary | ICD-10-CM | POA: Diagnosis not present

## 2016-11-06 ENCOUNTER — Ambulatory Visit: Payer: 59 | Admitting: Family Medicine

## 2016-11-15 ENCOUNTER — Telehealth: Payer: Self-pay | Admitting: Podiatry

## 2016-11-15 ENCOUNTER — Encounter: Payer: Self-pay | Admitting: Family Medicine

## 2016-11-15 NOTE — Telephone Encounter (Signed)
Called pt back in regards to her medical records request. I told the pt I'm in the process of working on her records and that I will get them completed by the end of the day on Friday. I explained to her that I would have to mail them to her because I am not able to send them to the social security office. I also explained that normally if it is a request for disability, I will get a request via USP mail from the disability determination center but I have not seen anything. Pt stated she understood and that she would hand deliver them herself.

## 2016-11-15 NOTE — Telephone Encounter (Signed)
I was calling to check on the status of my medical records request. I was in last week and signed a release form for them to be released for where I'm trying to get disability. Please call me back at [phone].

## 2016-11-17 ENCOUNTER — Encounter: Payer: Self-pay | Admitting: Podiatry

## 2016-11-17 NOTE — Progress Notes (Signed)
Requested medical records by pt mailed out to her today per her request. [name]

## 2016-12-20 DIAGNOSIS — M79676 Pain in unspecified toe(s): Secondary | ICD-10-CM

## 2017-01-15 ENCOUNTER — Other Ambulatory Visit: Payer: Self-pay | Admitting: Adult Health

## 2017-01-24 ENCOUNTER — Other Ambulatory Visit: Payer: Self-pay | Admitting: *Deleted

## 2017-01-24 MED ORDER — SIMVASTATIN 40 MG PO TABS: ORAL_TABLET | ORAL | 0 refills | Status: DC

## 2017-01-24 MED ORDER — LISINOPRIL-HYDROCHLOROTHIAZIDE 10-12.5 MG PO TABS: 1.0000 | ORAL_TABLET | Freq: Every day | ORAL | 0 refills | Status: DC

## 2017-01-25 ENCOUNTER — Other Ambulatory Visit: Payer: Self-pay | Admitting: *Deleted

## 2017-01-25 MED ORDER — LISINOPRIL-HYDROCHLOROTHIAZIDE 10-12.5 MG PO TABS: 1.0000 | ORAL_TABLET | Freq: Every day | ORAL | 0 refills | Status: DC

## 2017-01-25 MED ORDER — SIMVASTATIN 40 MG PO TABS: ORAL_TABLET | ORAL | 0 refills | Status: DC

## 2017-02-08 ENCOUNTER — Telehealth: Payer: Self-pay | Admitting: *Deleted

## 2017-02-08 MED ORDER — NYSTATIN 100000 UNIT/GM EX POWD: Freq: Three times a day (TID) | CUTANEOUS | 1 refills | Status: DC

## 2017-02-08 NOTE — Telephone Encounter (Signed)
Pt called requesting a refill on her nystatin powder to be sent to [facility] in [place]. Informed pt that I would send her request to [facility] and she should check with her pharmacy in the next 24 hours.

## 2017-02-08 NOTE — Telephone Encounter (Signed)
Will refill nystatin powder

## 2017-05-31 ENCOUNTER — Other Ambulatory Visit: Payer: Self-pay | Admitting: Adult Health

## 2017-09-18 ENCOUNTER — Telehealth: Payer: Self-pay | Admitting: Family Medicine

## 2017-09-18 NOTE — Telephone Encounter (Signed)
Patient scheduled office visit with Dr Richardson Landry in the am.

## 2017-09-18 NOTE — Telephone Encounter (Signed)
Patient is requesting order for labs, specifically to check thyroid.

## 2017-09-18 NOTE — Telephone Encounter (Signed)
Patient last seen 09/2016 and does not have an appt scheduled. Patient needs to schedule appt to see Dr and have labs done. Left message to return call

## 2017-09-19 ENCOUNTER — Encounter: Payer: Self-pay | Admitting: Family Medicine

## 2017-09-19 ENCOUNTER — Ambulatory Visit (INDEPENDENT_AMBULATORY_CARE_PROVIDER_SITE_OTHER): Payer: Medicare Other | Admitting: Family Medicine

## 2017-09-19 VITALS — BP 138/86 | Ht 63.0 in | Wt 227.0 lb

## 2017-09-19 DIAGNOSIS — E785 Hyperlipidemia, unspecified: Secondary | ICD-10-CM | POA: Diagnosis not present

## 2017-09-19 DIAGNOSIS — I10 Essential (primary) hypertension: Secondary | ICD-10-CM | POA: Diagnosis not present

## 2017-09-19 DIAGNOSIS — E119 Type 2 diabetes mellitus without complications: Secondary | ICD-10-CM

## 2017-09-19 DIAGNOSIS — E039 Hypothyroidism, unspecified: Secondary | ICD-10-CM | POA: Insufficient documentation

## 2017-09-19 DIAGNOSIS — Z1329 Encounter for screening for other suspected endocrine disorder: Secondary | ICD-10-CM | POA: Diagnosis not present

## 2017-09-19 DIAGNOSIS — Z79899 Other long term (current) drug therapy: Secondary | ICD-10-CM | POA: Diagnosis not present

## 2017-09-19 DIAGNOSIS — R5383 Other fatigue: Secondary | ICD-10-CM | POA: Diagnosis not present

## 2017-09-19 LAB — POCT GLYCOSYLATED HEMOGLOBIN (HGB A1C): HEMOGLOBIN A1C: 5.6 % (ref 4.0–5.6)

## 2017-09-19 MED ORDER — GLUCOSE BLOOD VI STRP: ORAL_STRIP | 1 refills | Status: DC

## 2017-09-19 MED ORDER — THYROID 30 MG PO TABS: 60.0000 mg | ORAL_TABLET | Freq: Every day | ORAL | 11 refills | Status: DC

## 2017-09-19 MED ORDER — MIRABEGRON ER 25 MG PO TB24: 25.0000 mg | ORAL_TABLET | Freq: Every day | ORAL | 5 refills | Status: DC

## 2017-09-19 MED ORDER — ONETOUCH DELICA LANCETS 33G MISC: 1 refills | Status: DC

## 2017-09-19 MED ORDER — LISINOPRIL-HYDROCHLOROTHIAZIDE 10-12.5 MG PO TABS: 1.0000 | ORAL_TABLET | Freq: Every day | ORAL | 1 refills | Status: DC

## 2017-09-19 MED ORDER — SIMVASTATIN 40 MG PO TABS: ORAL_TABLET | ORAL | 1 refills | Status: DC

## 2017-09-19 NOTE — Progress Notes (Signed)
   Subjective:    Patient ID: [name], female    DOB: 23-Aug-1955, 62 y.o.   MRN: [id number]  HPI Pt here today to get lab orders. Pt last visit was 10/02/16. Pt would like a script for Myrbetriq.  Results for orders placed or performed in visit on 09/19/17  POCT HgB A1C  Result Value Ref Range   Hemoglobin A1C 5.6 4.0 - 5.6 %   HbA1c POC (<> result, manual entry)  4.0 - 5.6 %   HbA1c, POC (prediabetic range)  5.7 - 6.4 %   HbA1c, POC (controlled diabetic range)  0.0 - 7.0 %   Hx of thyr supplement has not had blood sork for a long time, states wants it.  Would prefer to get it through here.  Has not had blood work for a while.    Patient claims compliance with diabetes medication. No obvious side effects. Reports no substantial low sugar spells. Most numbers are generally in good range when checked fasting. Generally does not miss a dose of medication. Watching diabetic diet closely  Blood pressure medicine and blood pressure levels reviewed today with patient. Compliant with blood pressure medicine. States does not miss a dose. No obvious side effects. Blood pressure generally good when checked elsewhere. Watching salt intake.  Patient continues to take lipid medication regularly. No obvious side effects from it. Generally does not miss a dose. Prior blood work results are reviewed with patient. Patient continues to work on fat intake in diet    Eye doc none in the past yr,     Review of Systems No headache, no major weight loss or weight gain, no chest pain no back pain abdominal pain no change in bowel habits complete ROS otherwise negative     Objective:   Physical Exam Alert and oriented, vitals reviewed and stable, NAD ENT-TM's and ext canals WNL bilat via otoscopic exam Soft palate, tonsils and post pharynx WNL via oropharyngeal exam Neck-symmetric, no masses; thyroid nonpalpable and nontender Pulmonary-no tachypnea or accessory muscle use; Clear without wheezes via  auscultation Card--no abnrml murmurs, rhythm reg and rate WNL Carotid pulses symmetric, without bruits        Assessment & Plan:  1 impression type 2 diabetes.  Good control.  Discussed to maintain same  2.  Hypertension.  Good control.  Discussed.  Maintain same  3.  Hyperlipidemia.  Control uncertain discussed we will check blood work her graph #4 hypothyroidism controlled we will check blood work  5.  PHQ 9 reveals substantial element of depression today.  Have asked patient to come back in 1 month to further discuss potential intervention

## 2017-09-20 LAB — LIPID PANEL
CHOL/HDL RATIO: 4 ratio (ref 0.0–4.4)
CHOLESTEROL TOTAL: 197 mg/dL (ref 100–199)
HDL: 49 mg/dL (ref >39)
LDL CALC: 120 mg/dL — AB (ref 0–99)
Triglycerides: 138 mg/dL (ref 0–149)
VLDL CHOLESTEROL CAL: 28 mg/dL (ref 5–40)

## 2017-09-20 LAB — MICROALBUMIN / CREATININE URINE RATIO
Creatinine, Urine: 64.2 mg/dL
Microalb/Creat Ratio: <4.7 mg/g creat (ref 0.0–30.0)
Microalbumin, Urine: <3 ug/mL

## 2017-09-20 LAB — HEPATIC FUNCTION PANEL
ALT: 19 IU/L (ref 0–32)
AST: 26 IU/L (ref 0–40)
Albumin: 4.5 g/dL (ref 3.6–4.8)
Alkaline Phosphatase: 63 IU/L (ref 39–117)
Bilirubin Total: 0.5 mg/dL (ref 0.0–1.2)
Bilirubin, Direct: 0.12 mg/dL (ref 0.00–0.40)
TOTAL PROTEIN: 7.2 g/dL (ref 6.0–8.5)

## 2017-09-20 LAB — CBC WITH DIFFERENTIAL/PLATELET
BASOS: 1 %
Basophils Absolute: 0.1 10*3/uL (ref 0.0–0.2)
EOS (ABSOLUTE): 0.4 10*3/uL (ref 0.0–0.4)
Eos: 6 %
HEMOGLOBIN: 14.4 g/dL (ref 11.1–15.9)
Hematocrit: 41.4 % (ref 34.0–46.6)
IMMATURE GRANS (ABS): 0 10*3/uL (ref 0.0–0.1)
IMMATURE GRANULOCYTES: 0 %
LYMPHS: 28 %
Lymphocytes Absolute: 1.7 10*3/uL (ref 0.7–3.1)
MCH: 31.5 pg (ref 26.6–33.0)
MCHC: 34.8 g/dL (ref 31.5–35.7)
MCV: 91 fL (ref 79–97)
MONOCYTES: 10 %
Monocytes Absolute: 0.6 10*3/uL (ref 0.1–0.9)
NEUTROS PCT: 55 %
Neutrophils Absolute: 3.4 10*3/uL (ref 1.4–7.0)
PLATELETS: 264 10*3/uL (ref 150–450)
RBC: 4.57 x10E6/uL (ref 3.77–5.28)
RDW: 12.8 % (ref 12.3–15.4)
WBC: 6.2 10*3/uL (ref 3.4–10.8)

## 2017-09-20 LAB — BASIC METABOLIC PANEL
BUN/Creatinine Ratio: 20 (ref 12–28)
BUN: 17 mg/dL (ref 8–27)
CALCIUM: 9.6 mg/dL (ref 8.7–10.3)
CO2: 25 mmol/L (ref 20–29)
Chloride: 103 mmol/L (ref 96–106)
Creatinine, Ser: 0.87 mg/dL (ref 0.57–1.00)
GFR calc Af Amer: 83 mL/min/{1.73_m2} (ref >59)
GFR, EST NON AFRICAN AMERICAN: 72 mL/min/{1.73_m2} (ref >59)
Glucose: 123 mg/dL — ABNORMAL HIGH (ref 65–99)
POTASSIUM: 4.7 mmol/L (ref 3.5–5.2)
Sodium: 138 mmol/L (ref 134–144)

## 2017-09-20 LAB — TSH: TSH: 2.49 u[IU]/mL (ref 0.450–4.500)

## 2017-09-27 ENCOUNTER — Encounter: Payer: Self-pay | Admitting: Family Medicine

## 2017-10-17 ENCOUNTER — Ambulatory Visit (INDEPENDENT_AMBULATORY_CARE_PROVIDER_SITE_OTHER): Payer: Medicare Other | Admitting: Family Medicine

## 2017-10-17 ENCOUNTER — Encounter: Payer: Self-pay | Admitting: Family Medicine

## 2017-10-17 VITALS — BP 136/86 | Ht 63.0 in | Wt 236.2 lb

## 2017-10-17 DIAGNOSIS — F321 Major depressive disorder, single episode, moderate: Secondary | ICD-10-CM | POA: Insufficient documentation

## 2017-10-17 DIAGNOSIS — M791 Myalgia, unspecified site: Secondary | ICD-10-CM

## 2017-10-17 MED ORDER — ESCITALOPRAM OXALATE 10 MG PO TABS: ORAL_TABLET | ORAL | 5 refills | Status: DC

## 2017-10-17 NOTE — Progress Notes (Signed)
Subjective:    Patient ID: [name], female    DOB: 1955-08-29, 62 y.o.   MRN: [id number]  HPI Pt here for one month follow up. Pt has multiple concerns. Mole on neck, neck stiffness, fatigue.  Results for orders placed or performed in visit on 09/19/17  Urine Microalbumin w/creat. ratio  Result Value Ref Range   Creatinine, Urine 64.2 Not Estab. mg/dL   Microalbumin, Urine <3.0 Not Estab. ug/mL   Microalb/Creat Ratio <4.7 0.0 - 30.0 mg/g creat  Lipid Profile  Result Value Ref Range   Cholesterol, Total 197 100 - 199 mg/dL   Triglycerides 138 0 - 149 mg/dL   HDL 49 >39 mg/dL   VLDL Cholesterol Cal 28 5 - 40 mg/dL   LDL Calculated 120 (H) 0 - 99 mg/dL   Chol/HDL Ratio 4.0 0.0 - 4.4 ratio  Hepatic function panel  Result Value Ref Range   Total Protein 7.2 6.0 - 8.5 g/dL   Albumin 4.5 3.6 - 4.8 g/dL   Bilirubin Total 0.5 0.0 - 1.2 mg/dL   Bilirubin, Direct 0.12 0.00 - 0.40 mg/dL   Alkaline Phosphatase 63 39 - 117 IU/L   AST 26 0 - 40 IU/L   ALT 19 0 - 32 IU/L  Basic Metabolic Panel (BMET)  Result Value Ref Range   Glucose 123 (H) 65 - 99 mg/dL   BUN 17 8 - 27 mg/dL   Creatinine, Ser 0.87 0.57 - 1.00 mg/dL   GFR calc non Af Amer 72 >59 mL/min/1.73   GFR calc Af Amer 83 >59 mL/min/1.73   BUN/Creatinine Ratio 20 12 - 28   Sodium 138 134 - 144 mmol/L   Potassium 4.7 3.5 - 5.2 mmol/L   Chloride 103 96 - 106 mmol/L   CO2 25 20 - 29 mmol/L   Calcium 9.6 8.7 - 10.3 mg/dL  CBC with Differential  Result Value Ref Range   WBC 6.2 3.4 - 10.8 x10E3/uL   RBC 4.57 3.77 - 5.28 x10E6/uL   Hemoglobin 14.4 11.1 - 15.9 g/dL   Hematocrit 41.4 34.0 - 46.6 %   MCV 91 79 - 97 fL   MCH 31.5 26.6 - 33.0 pg   MCHC 34.8 31.5 - 35.7 g/dL   RDW 12.8 12.3 - 15.4 %   Platelets 264 150 - 450 x10E3/uL   Neutrophils 55 Not Estab. %   Lymphs 28 Not Estab. %   Monocytes 10 Not Estab. %   Eos 6 Not Estab. %   Basos 1 Not Estab. %   Neutrophils Absolute 3.4 1.4 - 7.0 x10E3/uL   Lymphocytes  Absolute 1.7 0.7 - 3.1 x10E3/uL   Monocytes Absolute 0.6 0.1 - 0.9 x10E3/uL   EOS (ABSOLUTE) 0.4 0.0 - 0.4 x10E3/uL   Basophils Absolute 0.1 0.0 - 0.2 x10E3/uL   Immature Granulocytes 0 Not Estab. %   Immature Grans (Abs) 0.0 0.0 - 0.1 x10E3/uL  TSH  Result Value Ref Range   TSH 2.490 0.450 - 4.500 uIU/mL  POCT HgB A1C  Result Value Ref Range   Hemoglobin A1C 5.6 4.0 - 5.6 %   HbA1c POC (<> result, manual entry)  4.0 - 5.6 %   HbA1c, POC (prediabetic range)  5.7 - 6.4 %   HbA1c, POC (controlled diabetic range)  0.0 - 7.0 %   Please see prior PHQ 9 screening.  Patient has substantial issues of diminished energy.  Diminished ability to sleep well.  Feeling down at times.  Also anxious at  times.  No suicidal thoughts.  Reports pain significant on for the past year  Reports neck pain left side worse with certain movements woke up with several weeks back  Has a small skin tag left lateral neck which is causing challenges catching on jewelry etc.  Review of Systems No headache, no major weight loss or weight gain, no chest pain no back pain abdominal pain no change in bowel habits complete ROS otherwise negative     Objective:   Physical Exam  Alert and oriented, vitals reviewed and stable, NAD ENT-TM's and ext canals WNL bilat via otoscopic exam Soft palate, tonsils and post pharynx WNL via oropharyngeal exam Neck-symmetric, no masses; thyroid nonpalpable and nontender Pulmonary-no tachypnea or accessory muscle use; Clear without wheezes via auscultation Card--no abnrml murmurs, rhythm reg and rate WNL Carotid pulses symmetric, without bruits Neck positive lateral neck tenderness to palpation and rotation  Patient was anesthetized and draped sterilized and small skin tag was removed      Assessment & Plan:  Impression #1 depression discussed at length rationale for meds discussed potential side effects benefits discussed patient would like to start meds will initiate  Lexapro  2.  Neck strain discussed local measures discussed ibuprofen as needed expect slow resolution  3.  Skin tag benign wound care discussed  Follow-up in 6 months medication

## 2017-11-08 ENCOUNTER — Other Ambulatory Visit: Payer: Self-pay | Admitting: Family Medicine

## 2017-12-10 ENCOUNTER — Ambulatory Visit (INDEPENDENT_AMBULATORY_CARE_PROVIDER_SITE_OTHER): Payer: Medicare Other | Admitting: Family Medicine

## 2017-12-10 ENCOUNTER — Encounter: Payer: Self-pay | Admitting: Family Medicine

## 2017-12-10 VITALS — BP 138/86 | Temp 98.4°F | Ht 63.0 in | Wt 240.0 lb

## 2017-12-10 DIAGNOSIS — F321 Major depressive disorder, single episode, moderate: Secondary | ICD-10-CM | POA: Diagnosis not present

## 2017-12-10 DIAGNOSIS — N951 Menopausal and female climacteric states: Secondary | ICD-10-CM

## 2017-12-10 MED ORDER — ESCITALOPRAM OXALATE 20 MG PO TABS: 20.0000 mg | ORAL_TABLET | Freq: Every morning | ORAL | 5 refills | Status: DC

## 2017-12-10 NOTE — Progress Notes (Signed)
   Subjective:    Patient ID: [name], female    DOB: 1956/01/27, 62 y.o.   MRN: [id number]  HPIhot flashes since age 49, since ovaries removed. Use to take DHEA but was taken off of it and would like something else for hot flashes that would be cheaper. DHEA was $80 per month.  Reports having been on patch and pill when first going through menopause, but was not very effective per pt.  Reports hot flashes more often at night time, 4-5 x in a 24 hr period, able to go back to sleep afterwards. Some problems with vaginal dryness, but not bothersome, not currently sexually active. No vaginal bleeding. Reports mood is well controlled with current dose of lexapro.  Regularly gets mammograms, no hx of abnormal, no family hx of breast cancer.   Review of Systems  Constitutional: Negative for chills, fever and unexpected weight change.  Respiratory: Negative for shortness of breath.   Cardiovascular: Negative for chest pain.  Genitourinary: Negative for dyspareunia and vaginal pain.  All other systems reviewed and are negative.      Objective:   Physical Exam  Constitutional: She is oriented to person, place, and time. She appears well-developed and well-nourished. No distress.  HENT:  Head: Normocephalic and atraumatic.  Eyes: Right eye exhibits no discharge. Left eye exhibits no discharge.  Neck: Neck supple. No thyromegaly present.  Cardiovascular: Normal rate, regular rhythm and normal heart sounds.  Pulmonary/Chest: Effort normal and breath sounds normal. No respiratory distress.  Lymphadenopathy:    She has no cervical adenopathy.  Neurological: She is alert and oriented to person, place, and time.  Skin: Skin is warm and dry.  Psychiatric: She has a normal mood and affect.  Vitals reviewed.         Assessment & Plan:  1. Hot flashes due to menopause  Discussed with patient the risks and benefits of HRT.  Given her age and cardiovascular risk status, the risk of estrogen  therapy is too high to be of benefit, and patient agrees with this.  Discussed the potential benefit of SSRIs to help with menopausal symptoms, given that patient is already taking a low dose of lexapro, will increase dose to 20 mg and see if this is helpful in reducing her hot flashes.  - escitalopram (LEXAPRO) 20 MG tablet; Take 1 tablet (20 mg total) by mouth every morning.  She will keep her 6 month f/u appt with Dr. Richardson Landry in January, or sooner if needed.  As attending physician to this patient visit, this patient was seen in conjunction with the nurse practitioner.  The history,physical and treatment plan was reviewed with the nurse practitioner and pertinent findings were verified with the patient.  Also the treatment plan was reviewed with the patient while they were present. WSLMD

## 2018-01-06 DIAGNOSIS — R69 Illness, unspecified: Secondary | ICD-10-CM | POA: Diagnosis not present

## 2018-01-27 ENCOUNTER — Other Ambulatory Visit: Payer: Self-pay | Admitting: Family Medicine

## 2018-01-28 DIAGNOSIS — Z0100 Encounter for examination of eyes and vision without abnormal findings: Secondary | ICD-10-CM | POA: Diagnosis not present

## 2018-01-28 DIAGNOSIS — H25813 Combined forms of age-related cataract, bilateral: Secondary | ICD-10-CM | POA: Diagnosis not present

## 2018-01-28 DIAGNOSIS — H5200 Hypermetropia, unspecified eye: Secondary | ICD-10-CM | POA: Diagnosis not present

## 2018-01-28 DIAGNOSIS — I10 Essential (primary) hypertension: Secondary | ICD-10-CM | POA: Diagnosis not present

## 2018-03-25 ENCOUNTER — Ambulatory Visit (INDEPENDENT_AMBULATORY_CARE_PROVIDER_SITE_OTHER): Payer: Medicare HMO

## 2018-03-25 ENCOUNTER — Ambulatory Visit (INDEPENDENT_AMBULATORY_CARE_PROVIDER_SITE_OTHER): Payer: Medicare HMO | Admitting: Physician Assistant

## 2018-03-25 ENCOUNTER — Encounter (INDEPENDENT_AMBULATORY_CARE_PROVIDER_SITE_OTHER): Payer: Self-pay | Admitting: Physician Assistant

## 2018-03-25 VITALS — Ht 63.0 in | Wt 240.0 lb

## 2018-03-25 DIAGNOSIS — M79672 Pain in left foot: Secondary | ICD-10-CM

## 2018-03-25 DIAGNOSIS — M25562 Pain in left knee: Secondary | ICD-10-CM

## 2018-03-25 DIAGNOSIS — Z981 Arthrodesis status: Secondary | ICD-10-CM | POA: Diagnosis not present

## 2018-03-27 ENCOUNTER — Encounter (INDEPENDENT_AMBULATORY_CARE_PROVIDER_SITE_OTHER): Payer: Self-pay | Admitting: Physician Assistant

## 2018-03-27 NOTE — Progress Notes (Signed)
Office Visit Note   Patient: [name]           Date of Birth: 11-23-1955           MRN: [id number] Visit Date: 03/25/2018              Requested by: Mikey Kirschner, [facility] [address] [place], [place] [address] PCP: Mikey Kirschner, MD  Chief Complaint  Patient presents with  . Left Foot - Pain, Follow-up  . Left Knee - Pain      HPI: Patient is a 63 year old woman who complains of a three-week history of left foot pain worse in the morning and worse when bending back her ankle.  Patient also complains of 3-week history of left knee pain with medial joint line symptoms with popping catching and locking.  Assessment & Plan: Visit Diagnoses:  1. Pain in left foot   2. S/P ankle fusion   3. Left knee pain, unspecified chronicity     Plan: Discussed the possibility of injection for the impingement of the left ankle as well as injection for the osteoarthritis of the left knee.  Patient states she would like to proceed with nonsteroidals and will follow-up as needed.  Follow-Up Instructions: Return if symptoms worsen or fail to improve.   Ortho Exam  Patient is alert, oriented, no adenopathy, well-dressed, normal affect, normal respiratory effort. Examination patient does have an antalgic gait.  She is tender to palpation anteriorly over the left ankle.  Maximal plantarflexion and dorsiflexion reproduces her ankle pain.  The subtalar and talonavicular fusion is stable and nontender.  Examination of the left knee she has crepitation with range of motion collaterals and cruciates are stable she is tender to palpation over the medial joint line.  Imaging: No results found. No images are attached to the encounter.  Labs: Lab Results  Component Value Date   HGBA1C 5.6 09/19/2017   HGBA1C 6.5 (H) 09/26/2016   HGBA1C 6.3 (H) 05/13/2016   ESRSEDRATE 13 09/26/2016     Lab Results  Component Value Date   ALBUMIN 4.5 09/19/2017   ALBUMIN 4.4 09/26/2016   ALBUMIN  4.3 05/13/2016    Body mass index is 42.51 kg/m.  Orders:  Orders Placed This Encounter  Procedures  . XR Foot 2 Views Left  . XR Knee 1-2 Views Left   No orders of the defined types were placed in this encounter.    Procedures: No procedures performed  Clinical Data: No additional findings.  ROS:  All other systems negative, except as noted in the HPI. Review of Systems  Objective: Vital Signs: Ht 5\' 3"  (1.6 m)   Wt 240 lb (108.9 kg)   BMI 42.51 kg/m   Specialty Comments:  No specialty comments available.  PMFS History: Patient Active Problem List   Diagnosis Date Noted  . Depression, major, single episode, moderate ([place]) 10/17/2017  . Hypothyroidism 09/19/2017  . Special screening for malignant neoplasms, colon   . Moody 12/30/2014  . Counseling for estrogen replacement therapy 12/30/2014  . 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 Mixed stress and urge urinary incontinence 02/10/2014  . Diabetes ([place]) 01/18/2014  . Posterior tibialis muscle dysfunction 10/24/2013  . Hyperlipidemia LDL goal <100 03/10/2013  . Superficial fungus infection of skin 07/05/2012  . Hot flashes 07/05/2012  . Rectocele 07/05/2012  . HTN (hypertension) 07/04/2012  . Hx of cervical cancer 07/04/2012  . Stiffness of joint, lower leg, left 08/28/2011  . Difficulty  in walking(719.7) 08/28/2011  . Pain in left knee 08/28/2011   Past Medical History:  Diagnosis Date  . Arthritis    "fingers occasionally" (08/26/2014)  . Asthmatic bronchitis    "haven't used an inhaler in years" (08/26/2014)  . Cervical cancer ([place])   . Counseling for estrogen replacement therapy 12/30/2014  . Diabetes mellitus without complication ([place])    "borderline"  . Elevated cholesterol   . Exertional shortness of breath   . Family history of adverse reaction to anesthesia    "I think my mama gets sick"  . Hot flashes 07/05/2012  . Hypertension   . IFG (impaired fasting glucose)   . Mixed stress and urge  urinary incontinence   . Moody 12/30/2014  . Obesity   . Panniculus   . PONV (postoperative nausea and vomiting)   . Rectocele 07/05/2012  . Stress incontinence   . Superficial fungus infection of skin     Family History  Problem Relation Age of Onset  . Heart attack Father   . Stroke Mother   . Diabetes Brother   . Hypertension Brother   . Diabetes Brother   . Hypertension Brother   . Diabetes Maternal Grandmother   . Cancer Maternal Aunt        cervical  . Diabetes Paternal Grandmother     Past Surgical History:  Procedure Laterality Date  . ABDOMINAL HYSTERECTOMY  1986   "partial"  . ANKLE FUSION Left 10/24/2013   Procedure: LEFT ANKLE SUBTALAR AND TALONAVICULAR FUSION;  Surgeon: Newt Minion, MD;  Location: [facility];  Service: Orthopedics;  Laterality: Left;  . ANKLE FUSION Left 08/26/2014   Procedure: Left Foot Take Down Non-union with Revision Talonavicular and Subtalar Fusion;  Surgeon: Newt Minion, MD;  Location: [facility];  Service: Orthopedics;  Laterality: Left;  . BACK SURGERY    . BILATERAL SALPINGECTOMY  2010   w/LOA  . COLONOSCOPY    . COLONOSCOPY N/A 07/14/2016   Procedure: COLONOSCOPY;  Surgeon: Danie Binder, MD;  [facility];  Service: Endoscopy;  Laterality: N/A;  8:30  . FUSION OF TALONAVICULAR JOINT Left    Take Down Non-union with Revision Talonavicular and Subtalar Fusion /notes 08/26/2014  . HAMMER TOE SURGERY Bilateral 2000-2013   right-left  . JOINT REPLACEMENT    . KNEE ARTHROSCOPY Bilateral 2008-2010    left-right  . LUMBAR DISC SURGERY  2004   Lumbar 4- 5  . POLYPECTOMY  07/14/2016   Procedure: POLYPECTOMY;  Surgeon: Danie Binder, MD;  [facility];  Service: Endoscopy;;  hepatic flexure  . TOTAL KNEE ARTHROPLASTY Right 2013   Social History   Occupational History  . Not on file  Tobacco Use  . Smoking status: Former Smoker    Packs/day: 0.50    Years: 10.00    Pack years: 5.00    Types: Cigarettes    Last attempt  to quit: 03/27/1988    Years since quitting: 30.0  . Smokeless tobacco: Never Used  Substance and Sexual Activity  . Alcohol use: Yes    Comment: occasionally a beer  . Drug use: No  . Sexual activity: Never    Birth control/protection: Surgical    Comment: hyst

## 2018-04-02 ENCOUNTER — Ambulatory Visit: Payer: Medicare HMO | Admitting: Family Medicine

## 2018-04-03 ENCOUNTER — Telehealth: Payer: Self-pay | Admitting: Family Medicine

## 2018-04-03 ENCOUNTER — Encounter: Payer: Self-pay | Admitting: Family Medicine

## 2018-04-03 ENCOUNTER — Ambulatory Visit (INDEPENDENT_AMBULATORY_CARE_PROVIDER_SITE_OTHER): Payer: Medicare HMO | Admitting: Family Medicine

## 2018-04-03 VITALS — BP 134/86 | Temp 98.0°F | Wt 245.6 lb

## 2018-04-03 DIAGNOSIS — E785 Hyperlipidemia, unspecified: Secondary | ICD-10-CM

## 2018-04-03 DIAGNOSIS — Z79899 Other long term (current) drug therapy: Secondary | ICD-10-CM

## 2018-04-03 DIAGNOSIS — E119 Type 2 diabetes mellitus without complications: Secondary | ICD-10-CM | POA: Diagnosis not present

## 2018-04-03 DIAGNOSIS — R05 Cough: Secondary | ICD-10-CM | POA: Diagnosis not present

## 2018-04-03 DIAGNOSIS — I10 Essential (primary) hypertension: Secondary | ICD-10-CM | POA: Diagnosis not present

## 2018-04-03 DIAGNOSIS — R053 Chronic cough: Secondary | ICD-10-CM

## 2018-04-03 MED ORDER — SIMVASTATIN 40 MG PO TABS: ORAL_TABLET | ORAL | 1 refills | Status: DC

## 2018-04-03 MED ORDER — LISINOPRIL-HYDROCHLOROTHIAZIDE 10-12.5 MG PO TABS: 1.0000 | ORAL_TABLET | Freq: Every day | ORAL | 1 refills | Status: DC

## 2018-04-03 MED ORDER — THYROID 30 MG PO TABS: 60.0000 mg | ORAL_TABLET | Freq: Every day | ORAL | 5 refills | Status: DC

## 2018-04-03 MED ORDER — FLUTICASONE PROPIONATE 50 MCG/ACT NA SUSP: 2.0000 | Freq: Every day | NASAL | 6 refills | Status: DC

## 2018-04-03 MED ORDER — ESCITALOPRAM OXALATE 10 MG PO TABS: ORAL_TABLET | ORAL | 5 refills | Status: DC

## 2018-04-03 NOTE — Progress Notes (Signed)
   Subjective:    Patient ID: [name], female    DOB: 02-06-1956, 63 y.o.   MRN: [id number]  Cough  This is a recurrent problem. The current episode started more than 1 month ago. The cough is productive of sputum. Associated symptoms comments: Pt states that her throat gets dry and then she has to cough to the point of vomiting. Neck pain and nasal drainage. . Treatments tried: honey to soothe throat; claritin, cough med, cough drops. The treatment provided mild relief.  pt states cough is worse at night.   Pt has questions regarding medications Lexapro and Celexa. Pt was placed on 20 mg Lexapro is September; Celexa 40 is on med list whereas lexapro 10 mg or 20 mg  is not on med list.  Thyroid and Lexapro or Celexa goes to [employer] cough of an on since October    then the bad coughing  Turns into an aggravating chronic cough   Uses claritin and honey sometimes   Sometimes does mucines someties cold tablet   Cough sometimes productive   Morning sugars overall btter   Patient claims compliance with diabetes medication. No obvious side effects. Reports no substantial low sugar spells. Most numbers are generally in good range when checked fasting. Generally does not miss a dose of medication. Watching diabetic diet closely  Patient continues to take lipid medication regularly. No obvious side effects from it. Generally does not miss a dose. Prior blood work results are reviewed with patient. Patient continues to work on fat intake in diet     Review of Systems  Respiratory: Positive for cough.        Objective:   Physical Exam Alert and oriented, vitals reviewed and stable, NAD ENT-TM's and ext canals WNL bilat via otoscopic exam Soft palate, tonsils and post pharynx WNL via oropharyngeal exam Neck-symmetric, no masses; thyroid nonpalpable and nontender Pulmonary-no tachypnea or accessory muscle use; Clear without wheezes via auscultation Card--no abnrml murmurs,  rhythm reg and rate WNL Carotid pulses symmetric, without bruits       Assessment & Plan:  Impression #1 hypertension.  Good control discussed maintain same meds  2.  Depression with element of anxiety.  Clinically stable.  To maintain Lexapro.  Discussed.  3.  Hyperlipidemia.  Status uncertain.  Appropriate blood work discussed maintain medicine pending  4.  Chronic cough.  Likely postnasal drip in its description.  Would recommend adding Flonase.  If this does not help call back we will consider further such as allergy referral  5.  Type 2 diabetes.  Control good per patient await A1c  Follow-up in 6 months.  Diet exercise discussed.  Follow-up as scheduled diet exercise discussed further recommendations based on blood work

## 2018-04-03 NOTE — Telephone Encounter (Signed)
Verbal order given stating it was ok to change manufacturers for thyroid med

## 2018-04-03 NOTE — Telephone Encounter (Signed)
Pharmacy calling in regards of wanting to change thyroid (ARMOUR) 30 MG tablet to MP Thyroid 30 MG. [place]

## 2018-04-19 ENCOUNTER — Other Ambulatory Visit (HOSPITAL_COMMUNITY): Payer: Self-pay | Admitting: Family Medicine

## 2018-04-19 ENCOUNTER — Ambulatory Visit: Payer: 59 | Admitting: Family Medicine

## 2018-04-22 DIAGNOSIS — Z79899 Other long term (current) drug therapy: Secondary | ICD-10-CM | POA: Diagnosis not present

## 2018-04-22 DIAGNOSIS — E119 Type 2 diabetes mellitus without complications: Secondary | ICD-10-CM | POA: Diagnosis not present

## 2018-04-22 DIAGNOSIS — E785 Hyperlipidemia, unspecified: Secondary | ICD-10-CM | POA: Diagnosis not present

## 2018-04-23 LAB — LIPID PANEL
CHOL/HDL RATIO: 3.3 ratio (ref 0.0–4.4)
CHOLESTEROL TOTAL: 187 mg/dL (ref 100–199)
HDL: 57 mg/dL (ref >39)
LDL CALC: 104 mg/dL — AB (ref 0–99)
TRIGLYCERIDES: 129 mg/dL (ref 0–149)
VLDL Cholesterol Cal: 26 mg/dL (ref 5–40)

## 2018-04-23 LAB — HEPATIC FUNCTION PANEL
ALT: 17 IU/L (ref 0–32)
AST: 23 IU/L (ref 0–40)
Albumin: 4.4 g/dL (ref 3.8–4.8)
Alkaline Phosphatase: 61 IU/L (ref 39–117)
Bilirubin Total: 0.3 mg/dL (ref 0.0–1.2)
Bilirubin, Direct: 0.09 mg/dL (ref 0.00–0.40)
TOTAL PROTEIN: 6.8 g/dL (ref 6.0–8.5)

## 2018-04-23 LAB — HEMOGLOBIN A1C
Est. average glucose Bld gHb Est-mCnc: 148 mg/dL
Hgb A1c MFr Bld: 6.8 % — ABNORMAL HIGH (ref 4.8–5.6)

## 2018-04-25 ENCOUNTER — Telehealth: Payer: Self-pay | Admitting: Adult Health

## 2018-04-25 NOTE — Telephone Encounter (Signed)
Patient called, she is wanting an order for a mammogram do be done at [facility].  She hasn't been seen here in three years, tried to make her an appointment, but she declined.  She wanted to see if Anderson Malta will give her this.  She has been having some soreness under one of her arms.  [phone]

## 2018-04-25 NOTE — Telephone Encounter (Signed)
Pt called requesting an order for a mammogram. Advised pt that she did not have to have an order for a routine mammogram. Pt then states that she wants an order for a breast u/s bc she is having soreness. Advised pt that she will need to make an appt to be evaluated by a provider first. Pt verbalized understanding and connected to scheduling.

## 2018-04-28 ENCOUNTER — Encounter: Payer: Self-pay | Admitting: Family Medicine

## 2018-05-02 ENCOUNTER — Encounter: Payer: Medicare HMO | Admitting: Advanced Practice Midwife

## 2018-05-15 ENCOUNTER — Ambulatory Visit: Payer: Medicare HMO | Admitting: Obstetrics and Gynecology

## 2018-05-15 ENCOUNTER — Other Ambulatory Visit: Payer: Self-pay

## 2018-05-15 ENCOUNTER — Encounter: Payer: Self-pay | Admitting: Obstetrics and Gynecology

## 2018-05-15 DIAGNOSIS — N393 Stress incontinence (female) (male): Secondary | ICD-10-CM | POA: Insufficient documentation

## 2018-05-15 DIAGNOSIS — Z1239 Encounter for other screening for malignant neoplasm of breast: Secondary | ICD-10-CM | POA: Insufficient documentation

## 2018-05-15 MED ORDER — MIRABEGRON ER 25 MG PO TB24: 25.0000 mg | ORAL_TABLET | Freq: Every day | ORAL | 5 refills | Status: DC

## 2018-05-15 NOTE — Progress Notes (Addendum)
[name] presents for breast exam and order for screening mammogram. She also reports menopausal Sx and stress urinary incont. Last used Myrbetriq and this worked well for her. Has tried several HRT medications without relief. She denies any FH of breast CA or any breast concerns.  PE AF VSS Lungs clear Heart RRR Breast sym supple no nipple discharge, masses or adenopathy Abd soft + BS  A/P Screening breast exam        Stress urinary incont  Pt to schedule screening mammogram. Will restart Myrbetriq 25 mg . She declines HRT at this time. Samples and Rx completed.  F/U in 2 months

## 2018-05-30 DIAGNOSIS — K219 Gastro-esophageal reflux disease without esophagitis: Secondary | ICD-10-CM | POA: Diagnosis not present

## 2018-07-05 ENCOUNTER — Telehealth: Payer: Self-pay | Admitting: Family Medicine

## 2018-07-05 DIAGNOSIS — R053 Chronic cough: Secondary | ICD-10-CM

## 2018-07-05 DIAGNOSIS — R05 Cough: Secondary | ICD-10-CM

## 2018-07-05 NOTE — Telephone Encounter (Signed)
Ok pulm ref per pt request for chronic cough, tell pt likely will take awhile because all pulm docs mostly working in hospitl during this crisis

## 2018-07-05 NOTE — Telephone Encounter (Signed)
Patient wants a referral to a lung specialist.  She said she was seen here for a cough a couple of months ago and she has been to see an ENT who told her it could be reflux but she wants to see a "lung doctor" to have her lungs checked out.

## 2018-07-05 NOTE — Telephone Encounter (Signed)
Referral ordered in Epic. Patient notified. 

## 2018-07-08 DIAGNOSIS — Z029 Encounter for administrative examinations, unspecified: Secondary | ICD-10-CM

## 2018-07-09 ENCOUNTER — Encounter: Payer: Self-pay | Admitting: Family Medicine

## 2018-07-23 ENCOUNTER — Telehealth: Payer: Self-pay | Admitting: *Deleted

## 2018-07-23 NOTE — Telephone Encounter (Signed)
Mychart message to patient with restrictions.

## 2018-07-24 ENCOUNTER — Ambulatory Visit: Payer: Self-pay | Admitting: Obstetrics & Gynecology

## 2018-07-26 ENCOUNTER — Encounter (HOSPITAL_COMMUNITY): Payer: Self-pay

## 2018-07-26 ENCOUNTER — Emergency Department (HOSPITAL_COMMUNITY): Payer: Medicare HMO

## 2018-07-26 ENCOUNTER — Emergency Department (HOSPITAL_COMMUNITY)
Admission: EM | Admit: 2018-07-26 | Discharge: 2018-07-26 | Disposition: A | Discharge status: Home / Self Care | Payer: Medicare HMO | Attending: Emergency Medicine | Admitting: Emergency Medicine

## 2018-07-26 ENCOUNTER — Other Ambulatory Visit: Payer: Self-pay

## 2018-07-26 DIAGNOSIS — Z87891 Personal history of nicotine dependence: Secondary | ICD-10-CM | POA: Diagnosis not present

## 2018-07-26 DIAGNOSIS — W2209XA Striking against other stationary object, initial encounter: Secondary | ICD-10-CM | POA: Insufficient documentation

## 2018-07-26 DIAGNOSIS — Z79899 Other long term (current) drug therapy: Secondary | ICD-10-CM | POA: Diagnosis not present

## 2018-07-26 DIAGNOSIS — S40011A Contusion of right shoulder, initial encounter: Secondary | ICD-10-CM | POA: Diagnosis not present

## 2018-07-26 DIAGNOSIS — I10 Essential (primary) hypertension: Secondary | ICD-10-CM | POA: Insufficient documentation

## 2018-07-26 DIAGNOSIS — E039 Hypothyroidism, unspecified: Secondary | ICD-10-CM | POA: Diagnosis not present

## 2018-07-26 DIAGNOSIS — Y999 Unspecified external cause status: Secondary | ICD-10-CM | POA: Insufficient documentation

## 2018-07-26 DIAGNOSIS — M25511 Pain in right shoulder: Secondary | ICD-10-CM | POA: Diagnosis not present

## 2018-07-26 DIAGNOSIS — Y939 Activity, unspecified: Secondary | ICD-10-CM | POA: Diagnosis not present

## 2018-07-26 DIAGNOSIS — S2231XA Fracture of one rib, right side, initial encounter for closed fracture: Secondary | ICD-10-CM | POA: Diagnosis not present

## 2018-07-26 DIAGNOSIS — Z96651 Presence of right artificial knee joint: Secondary | ICD-10-CM | POA: Diagnosis not present

## 2018-07-26 DIAGNOSIS — S299XXA Unspecified injury of thorax, initial encounter: Secondary | ICD-10-CM | POA: Diagnosis present

## 2018-07-26 DIAGNOSIS — W19XXXA Unspecified fall, initial encounter: Secondary | ICD-10-CM

## 2018-07-26 DIAGNOSIS — Y929 Unspecified place or not applicable: Secondary | ICD-10-CM | POA: Insufficient documentation

## 2018-07-26 DIAGNOSIS — E119 Type 2 diabetes mellitus without complications: Secondary | ICD-10-CM | POA: Insufficient documentation

## 2018-07-26 NOTE — ED Triage Notes (Signed)
Pt reports she accidentally fell of porch onto concrete and landscaping blocks.  C/O pain to r arm, r shoulder, and r ribs.  Denies hitting head or loss of consciousness

## 2018-07-26 NOTE — ED Provider Notes (Signed)
[facility] EMERGENCY DEPARTMENT Provider Note   CSN: [id number] Arrival date & time: 07/26/18  1603    History   Chief Complaint Chief Complaint  Patient presents with   Fall    HPI [name] is a 63 y.o. female.     Accidental fall off from porch striking concrete and landscaping blocks at home just prior to visit.  No head or neck trauma.  Complains of right shoulder and right mid chest discomfort.  No substernal chest pain, dyspnea, neuro deficits.  Severity is moderate.  Palpation makes pain worse     Past Medical History:  Diagnosis Date   Arthritis    "fingers occasionally" (08/26/2014)   Asthmatic bronchitis    "haven't used an inhaler in years" (08/26/2014)   Cervical cancer ([place])    Counseling for estrogen replacement therapy 12/30/2014   Diabetes mellitus without complication ([place])    "borderline"   Elevated cholesterol    Exertional shortness of breath    Family history of adverse reaction to anesthesia    "I think my mama gets sick"   Hot flashes 07/05/2012   Hypertension    IFG (impaired fasting glucose)    Mixed stress and urge urinary incontinence    Moody 12/30/2014   Obesity    Panniculus    PONV (postoperative nausea and vomiting)    Rectocele 07/05/2012   Stress incontinence    Superficial fungus infection of skin     Patient Active Problem List   Diagnosis Date Noted   Urinary, incontinence, stress female 05/15/2018   Encounter for screening breast examination 05/15/2018   Depression, major, single episode, moderate ([place]) 10/17/2017   Hypothyroidism 09/19/2017   Special screening for malignant neoplasms, colon    Moody 12/30/2014   Counseling for estrogen replacement therapy 12/30/2014   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   Mixed stress and urge urinary incontinence 02/10/2014   Diabetes ([place]) 01/18/2014   Posterior tibialis muscle dysfunction 10/24/2013   Hyperlipidemia LDL goal <100 03/10/2013    Superficial fungus infection of skin 07/05/2012   Hot flashes 07/05/2012   Rectocele 07/05/2012   HTN (hypertension) 07/04/2012   Hx of cervical cancer 07/04/2012   Stiffness of joint, lower leg, left 08/28/2011   Difficulty in walking(719.7) 08/28/2011   Pain in left knee 08/28/2011    Past Surgical History:  Procedure Laterality Date   ABDOMINAL HYSTERECTOMY  1986   "partial"   ANKLE FUSION Left 10/24/2013   Procedure: LEFT ANKLE SUBTALAR AND TALONAVICULAR FUSION;  Surgeon: Newt Minion, MD;  Location: [facility];  Service: Orthopedics;  Laterality: Left;   ANKLE FUSION Left 08/26/2014   Procedure: Left Foot Take Down Non-union with Revision Talonavicular and Subtalar Fusion;  Surgeon: Newt Minion, MD;  Location: [facility];  Service: Orthopedics;  Laterality: Left;   BACK SURGERY     BILATERAL SALPINGECTOMY  2010   w/LOA   COLONOSCOPY     COLONOSCOPY N/A 07/14/2016   Procedure: COLONOSCOPY;  Surgeon: Danie Binder, MD;  [facility];  Service: Endoscopy;  Laterality: N/A;  8:30   FUSION OF TALONAVICULAR JOINT Left    Take Down Non-union with Revision Talonavicular and Subtalar Fusion /notes 08/26/2014   HAMMER TOE SURGERY Bilateral 2000-2013   right-left   JOINT REPLACEMENT     KNEE ARTHROSCOPY Bilateral 2008-2010    left-right   LUMBAR DISC SURGERY  2004   Lumbar 4- 5   POLYPECTOMY  07/14/2016   Procedure: POLYPECTOMY;  Surgeon: Danie Binder, MD;  [facility];  Service: Endoscopy;;  hepatic flexure   TOTAL KNEE ARTHROPLASTY Right 2013     OB History    Gravida  2   Para  2   Term      Preterm      AB      Living  2     SAB      TAB      Ectopic      Multiple      Live Births  2            Home Medications    Prior to Admission medications   Medication Sig Start Date End Date Taking? Authorizing Provider  escitalopram (LEXAPRO) 10 MG tablet Take one tablet by mouth every morning 04/03/18   Mikey Kirschner, MD    fluticasone [facility]) 50 MCG/ACT nasal spray Place 2 sprays into both nostrils daily. 04/03/18   Mikey Kirschner, MD  glucose blood (ONE TOUCH ULTRA TEST) test strip USE TO TEST BLOOD SUGAR DAILY Patient not taking: Reported on 05/15/2018 09/19/17   Mikey Kirschner, MD  ibuprofen (ADVIL,MOTRIN) 200 MG tablet Take 600-800 mg by mouth every 6 (six) hours as needed for moderate pain.     [provider]  lisinopril-hydrochlorothiazide (PRINZIDE,ZESTORETIC) 10-12.5 MG tablet Take 1 tablet by mouth daily. 04/03/18   Mikey Kirschner, MD  mirabegron ER (MYRBETRIQ) 25 MG TB24 tablet Take 1 tablet (25 mg total) by mouth daily. 05/15/18   Chancy Milroy, MD  nystatin (MYCOSTATIN/NYSTOP) powder APPLY 3 (THREE) TIMES DAILY TOPICALLY. 05/31/17   Estill Dooms, NP  ONETOUCH DELICA LANCETS 16X MISC USE TO OBTAIN A BLOOD SPECIMEN TO TEST BLOOD SUGAR Patient not taking: Reported on 05/15/2018 09/19/17   Mikey Kirschner, MD  simvastatin (ZOCOR) 40 MG tablet TAKE 1 TABLET (40 MG TOTAL) BY MOUTH DAILY AT 6 PM. 04/03/18   Mikey Kirschner, MD  thyroid (ARMOUR) 30 MG tablet Take 2 tablets (60 mg total) by mouth daily before breakfast. 04/03/18   Mikey Kirschner, MD    Family History Family History  Problem Relation Age of Onset   Heart attack Father    Stroke Mother    Diabetes Brother    Hypertension Brother    Diabetes Brother    Hypertension Brother    Diabetes Maternal Grandmother    Cancer Maternal Aunt        cervical   Diabetes Paternal Grandmother     Social History Social History   Tobacco Use   Smoking status: Former Smoker    Packs/day: 0.50    Years: 10.00    Pack years: 5.00    Types: Cigarettes    Last attempt to quit: 03/27/1988    Years since quitting: 30.3   Smokeless tobacco: Never Used  Substance Use Topics   Alcohol use: Yes    Comment: occasionally a beer   Drug use: No     Allergies   Naprosyn [naproxen]   Review of Systems Review of  Systems  All other systems reviewed and are negative.    Physical Exam Updated Vital Signs BP (!) 168/89 (BP Location: Right Arm)    Pulse 71    Temp 98.5 F (36.9 C) (Oral)    Resp 20    Ht 5\' 3"  (1.6 m)    Wt 114.3 kg    SpO2 97%    BMI 44.64 kg/m   Physical Exam  Vitals signs and nursing note reviewed.  Constitutional:      Appearance: She is well-developed.  HENT:     Head: Normocephalic and atraumatic.  Eyes:     Conjunctiva/sclera: Conjunctivae normal.  Neck:     Musculoskeletal: Neck supple.  Cardiovascular:     Rate and Rhythm: Normal rate and regular rhythm.  Pulmonary:     Effort: Pulmonary effort is normal.     Breath sounds: Normal breath sounds.     Comments: Tender right anterior lateral chest wall Abdominal:     General: Bowel sounds are normal.     Palpations: Abdomen is soft.  Musculoskeletal:     Comments: Pain with range of motion of right shoulder.  Skin:    General: Skin is warm and dry.  Neurological:     Mental Status: She is alert and oriented to person, place, and time.  Psychiatric:        Behavior: Behavior normal.      ED Treatments / Results  Labs (all labs ordered are listed, but only abnormal results are displayed) Labs Reviewed - No data to display  EKG None  Radiology Dg Ribs Unilateral W/chest Right  Result Date: 07/26/2018 CLINICAL DATA:  Trauma EXAM: RIGHT RIBS AND CHEST - 3+ VIEW COMPARISON:  Chest radiograph 10/15/2013 FINDINGS: Upper normal heart size. Mediastinal contours and pulmonary vascularity normal. Lungs clear. Central peribronchial thickening. No pleural effusion or pneumothorax. Bones demineralized. Minimally displaced fracture involving the lateral RIGHT sixth rib. Questionable nondisplaced fracture lateral RIGHT seventh rib. IMPRESSION: Fractures involving the RIGHT lateral sixth and questionably seventh ribs. Electronically Signed   By: Lavonia Dana M.D.   On: 07/26/2018 17:13   Dg Shoulder Right  Result Date:  07/26/2018 CLINICAL DATA:  Fall today with right shoulder pain EXAM: RIGHT SHOULDER - 2+ VIEW COMPARISON:  None. FINDINGS: Acute nondisplaced lateral right fifth rib fracture. No right shoulder fracture. No evidence of right acromioclavicular separation. No right glenohumeral dislocation. No focal osseous lesions. Mild right acromioclavicular osteoarthritis. IMPRESSION: Acute nondisplaced lateral right fifth rib fracture. No right shoulder fracture or malalignment. Mild right AC joint osteoarthritis. Electronically Signed   By: Ilona Sorrel M.D.   On: 07/26/2018 17:12    Procedures Procedures (including critical care time)  Medications Ordered in ED Medications - No data to display   Initial Impression / Assessment and Plan / ED Course  I have reviewed the triage vital signs and the nursing notes.  Pertinent labs & imaging results that were available during my care of the patient were reviewed by me and considered in my medical decision making (see chart for details).        Status post fall.  Plain films of right shoulder and chest reveal a nondisplaced right anterior fifth rib fracture.  No pneumothorax.  Patient is stable at discharge.  Final Clinical Impressions(s) / ED Diagnoses   Final diagnoses:  Fall, initial encounter  Closed fracture of one rib of right side, initial encounter  Contusion of right shoulder, initial encounter    ED Discharge Orders    None       Nat Christen, MD 07/26/18 1729

## 2018-07-26 NOTE — Discharge Instructions (Addendum)
You have fractured your right fifth rib.  There is no specific cure for this.  Tylenol and/or ibuprofen for pain.  Take deep breaths with a pillow as a brace.

## 2018-07-30 ENCOUNTER — Encounter: Payer: Self-pay | Admitting: Internal Medicine

## 2018-07-30 ENCOUNTER — Other Ambulatory Visit: Payer: Self-pay

## 2018-07-30 ENCOUNTER — Ambulatory Visit: Payer: Medicare HMO | Admitting: Internal Medicine

## 2018-07-30 VITALS — BP 144/96 | HR 95 | Temp 98.0°F | Ht 63.0 in | Wt 255.6 lb

## 2018-07-30 DIAGNOSIS — R058 Other specified cough: Secondary | ICD-10-CM | POA: Insufficient documentation

## 2018-07-30 DIAGNOSIS — I10 Essential (primary) hypertension: Secondary | ICD-10-CM | POA: Diagnosis not present

## 2018-07-30 DIAGNOSIS — R05 Cough: Secondary | ICD-10-CM | POA: Diagnosis not present

## 2018-07-30 MED ORDER — PANTOPRAZOLE SODIUM 40 MG PO TBEC: 40.0000 mg | DELAYED_RELEASE_TABLET | Freq: Every day | ORAL | 2 refills | Status: DC

## 2018-07-30 MED ORDER — FAMOTIDINE 20 MG PO TABS: ORAL_TABLET | ORAL | 11 refills | Status: DC

## 2018-07-30 MED ORDER — TELMISARTAN-HCTZ 40-12.5 MG PO TABS: 1.0000 | ORAL_TABLET | Freq: Every day | ORAL | 2 refills | Status: DC

## 2018-07-30 NOTE — Assessment & Plan Note (Signed)
Body mass index is 45.28 kg/m.    Lab Results  Component Value Date   TSH 2.490 09/19/2017     Contributing to gerd risk/ doe/reviewed the need and the process to achieve and maintain neg calorie balance > defer f/u primary care including intermittently monitoring thyroid status       Total time devoted to counseling  > 50 % of initial 60 min office visit:  review case with pt/ discussion of options/alternatives/ personally creating written customized instructions  in presence of pt  then going over those specific  Instructions directly with the pt including how to use all of the meds but in particular covering each new medication in detail and the difference between the maintenance= "automatic" meds and the prns using an action plan format for the latter (If this problem/symptom => do that organization reading Left to right).  Please see AVS from this visit for a full list of these instructions which I personally wrote for this pt and  are unique to this visit.

## 2018-07-30 NOTE — Progress Notes (Signed)
[name], female    DOB: February 14, 1956     MRN: [id number]   Brief patient profile:  63 yowf quit smoking around 1990 with cough that resolved but noted onset of some seasonal rhinitis spring > fall around 2000 rx with otc and was given inhaler but didn't need it,  then went on cruise out of [facility] in Oct 2019 with severe cough on around day 7th and ever since > eval by ENT c/w reflux > rx helped some but doesn't knock it out so referred to pulmonary clinic 07/30/2018 by Dr  Karin Golden      History of Present Illness  07/30/2018  Pulmonary/ 1st office eval/Danali Marinos ? On ACEi since 2000  Chief Complaint  Patient presents with   Pulmonary Consult    Referred by Dr Wolfgang Phoenix. Pt c/o cough since Oct 2019- much improved over the past 2 wks. Cough is non prod.   Dyspnea:  Broke rib on R and more sob since  Cough: better, still having urge to clear throat and hoarse Sleep: on side/ bed is flat  SABA use: not using  Cp p fell off porch "got too busy talking "  07/26/2018 and broke rib on R   No obvious day to day or daytime variability or assoc excess/ purulent sputum or mucus plugs or hemoptysis   or chest tightness, subjective wheeze or overt sinus or hb symptoms.   Sleeping as above  without nocturnal  or early am exacerbation  of respiratory  c/o's or need for noct saba. Also denies any obvious fluctuation of symptoms with weather or environmental changes or other aggravating or alleviating factors except as outlined above   No unusual exposure hx or h/o childhood pna/ asthma or knowledge of premature birth.  Current Allergies, Complete Past Medical History, Past Surgical History, Family History, and Social History were reviewed in [employer] record.  ROS  The following are not active complaints unless bolded Hoarseness, sore throat, dysphagia, dental problems, itching, sneezing,  nasal congestion or discharge of excess mucus or purulent secretions, ear ache,   fever,  chills, sweats, unintended wt loss or wt gain, classically exertional cp,  orthopnea pnd or arm/hand swelling  or leg swelling, presyncope, palpitations, abdominal pain, anorexia, nausea, vomiting, diarrhea  or change in bowel habits or change in bladder habits, change in stools or change in urine, dysuria, hematuria,  rash, arthralgias, visual complaints, headache, numbness, weakness or ataxia or problems with walking or coordination,  change in mood or  memory.          Past Medical History:  Diagnosis Date   Arthritis    "fingers occasionally" (08/26/2014)   Asthmatic bronchitis    "haven't used an inhaler in years" (08/26/2014)   Cervical cancer ([place])    Counseling for estrogen replacement therapy 12/30/2014   Diabetes mellitus without complication ([place])    "borderline"   Elevated cholesterol    Exertional shortness of breath    Family history of adverse reaction to anesthesia    "I think my mama gets sick"   Hot flashes 07/05/2012   Hypertension    IFG (impaired fasting glucose)    Mixed stress and urge urinary incontinence    Moody 12/30/2014   Obesity    Panniculus    PONV (postoperative nausea and vomiting)    Rectocele 07/05/2012   Stress incontinence    Superficial fungus infection of skin     Outpatient Medications Prior to Visit  Medication  Sig Dispense Refill   acetaminophen (TYLENOL) 325 MG tablet Take 650 mg by mouth every 6 (six) hours as needed.     escitalopram (LEXAPRO) 10 MG tablet Take one tablet by mouth every morning 30 tablet 5   lisinopril-hydrochlorothiazide (PRINZIDE,ZESTORETIC) 10-12.5 MG tablet Take 1 tablet by mouth daily. 90 tablet 1   mirabegron ER (MYRBETRIQ) 25 MG TB24 tablet Take 1 tablet (25 mg total) by mouth daily. 30 tablet 5   nystatin (MYCOSTATIN/NYSTOP) powder APPLY 3 (THREE) TIMES DAILY TOPICALLY. 60 g 1   ONETOUCH DELICA LANCETS 65K MISC USE TO OBTAIN A BLOOD SPECIMEN TO TEST BLOOD SUGAR 100 each 1   simvastatin  (ZOCOR) 40 MG tablet TAKE 1 TABLET (40 MG TOTAL) BY MOUTH DAILY AT 6 PM. 90 tablet 1   thyroid (ARMOUR) 30 MG tablet Take 2 tablets (60 mg total) by mouth daily before breakfast. 60 tablet 5   fluticasone (FLONASE) 50 MCG/ACT nasal spray Place 2 sprays into both nostrils daily. 16 g 6   glucose blood (ONE TOUCH ULTRA TEST) test strip USE TO TEST BLOOD SUGAR DAILY 100 each 1   ibuprofen (ADVIL,MOTRIN) 200 MG tablet Take 600-800 mg by mouth every 6 (six) hours as needed for moderate pain.         Objective:     BP (!) 144/96 (BP Location: Left Arm, Cuff Size: Large)    Pulse 95    Temp 98 F (36.7 C) (Oral)    Ht 5\' 3"  (1.6 m)    Wt 255 lb 9.6 oz (115.9 kg)    SpO2 100%    BMI 45.28 kg/m   SpO2: 100 %  RA  amb obese wf  slt coarse texture of voice    HEENT: nl dentition, turbinates bilaterally, and oropharynx. Nl external ear canals without cough reflex   NECK :  without JVD/Nodes/TM/ nl carotid upstrokes bilaterally   LUNGS: no acc muscle use,  Nl contour chest which is clear to A and P bilaterally without cough on insp or exp maneuvers   CV:  RRR  no s3 or murmur or increase in P2, and no edema   ABD:  Obese/ soft and nontender with nl inspiratory excursion in the supine position. No bruits or organomegaly appreciated, bowel sounds nl  MS:  Nl gait/ ext warm without deformities, calf tenderness, cyanosis or clubbing No obvious joint restrictions   SKIN: warm and dry without lesions    NEURO:  alert, approp, nl sensorium with  no motor or cerebellar deficits apparent.       I personally reviewed images and agree with radiology impression as follows:  CXR:  07/26/2018 Fractures involving the RIGHT lateral sixth and questionably seventh ribs.      Assessment   No problem-specific Assessment & Plan notes found for this encounter.     Christinia Gully, MD 07/30/2018

## 2018-07-30 NOTE — Patient Instructions (Addendum)
Stop lisinopril and start micardis 40-12.5 one daily in its place   For the next month:  Pantoprazole (protonix) 40 mg   Take  30-60 min before first meal of the day and Pepcid (famotidine)  20 mg one hour before   bedtime until return to office - this is the best way to tell whether stomach acid is contributing to your problem.   GERD (REFLUX)  is an extremely common cause of respiratory symptoms just like yours , many times with no obvious heartburn at all.    It can be treated with medication, but also with lifestyle changes including elevation of the head of your bed (ideally with 6 -8inch blocks under the headboard of your bed),  Smoking cessation, avoidance of late meals, excessive alcohol, and avoid fatty foods, chocolate, peppermint, colas, red wine, and acidic juices such as orange juice.  NO MINT OR MENTHOL PRODUCTS SO NO COUGH DROPS  USE SUGARLESS CANDY INSTEAD (Jolley ranchers or Stover's or Life Savers) or even ice chips will also do - the key is to swallow to prevent all throat clearing. NO OIL BASED VITAMINS - use powdered substitutes.  Avoid fish oil when coughing.    If all better in one month, see Dr Wolfgang Phoenix in one month for pressure check and refills, if not all better return here

## 2018-07-30 NOTE — Assessment & Plan Note (Addendum)
On ACEi since ? 2000 with pnds seasonally > try off 07/30/2018 and on max gerd rx   The most common causes of chronic cough in immunocompetent adults include the following: upper airway cough syndrome (UACS), previously referred to as postnasal drip syndrome (PNDS), which is caused by variety of rhinosinus conditions; (2) asthma; (3) GERD; (4) chronic bronchitis from cigarette smoking or other inhaled environmental irritants; (5) nonasthmatic eosinophilic bronchitis; and (6) bronchiectasis.   These conditions, singly or in combination, have accounted for up to 94% of the causes of chronic cough in prospective studies.   Other conditions have constituted no >6% of the causes in prospective studies These have included bronchogenic carcinoma, chronic interstitial pneumonia, sarcoidosis, left ventricular failure, ACEI-induced cough, and aspiration from a condition associated with pharyngeal dysfunction.    Chronic cough is often simultaneously caused by more than one condition. A single cause has been found from 38 to 82% of the time, multiple causes from 18 to 62%. Multiply caused cough has been the result of three diseases up to 42% of the time.   Upper airway cough syndrome (previously labeled PNDS),  is so named because it's frequently impossible to sort out how much is  CR/sinusitis with freq throat clearing (which can be related to primary GERD)   vs  causing  secondary (" extra esophageal")  GERD from wide swings in gastric pressure that occur with throat clearing, often  promoting self use of mint and menthol lozenges that reduce the lower esophageal sphincter tone and exacerbate the problem further in a cyclical fashion.   These are the same pts (now being labeled as having "irritable larynx syndrome" by some cough centers) who not infrequently have a history of having failed to tolerate ace inhibitors,  dry powder inhalers or biphosphonates or report having atypical/extraesophageal reflux symptoms  that don't respond to standard doses of PPI  and are easily confused as having aecopd or asthma flares by even experienced allergists/ pulmonologists (myself included).    Very strongly suspect acei "fueling" cough flares which can be severe with any number of triggers eg pnds/ viral uri/gerd   First step is therefore to try off acei and add max gerd rx then regroup here p June 1 if not improving to her satisfaction.  Discussed in detail all the  indications, usual  risks and alternatives  relative to the benefits with patient who agrees to proceed with above trial and  F/u prn

## 2018-07-30 NOTE — Assessment & Plan Note (Signed)
In the best review of chronic cough to date ( NEJM 2016 375 [phone]) ,  ACEi are now felt to cause cough in up to  20% of pts which is a 4 fold increase from previous reports and does not include the variety of non-specific complaints we see in pulmonary clinic in pts on ACEi but previously attributed to another dx like  Copd/asthma and  include PNDS, throat and chest congestion, "bronchitis", unexplained dyspnea and noct "strangling" sensations, and hoarseness, but also  atypical /refractory GERD symptoms like dysphagia and "bad heartburn"   The only way I know  to prove this is not an "ACEi Case" is a trial off ACEi x a minimum of 4 weeks then regroup.   Try micardis 40-12.5 one daily and refills per PCP if cough and "pnds"  better, f/u here if not.

## 2018-08-13 ENCOUNTER — Telehealth: Payer: Self-pay

## 2018-08-13 NOTE — Telephone Encounter (Signed)
Patient called today stating she fell on May 1 off of her porch on to concrete and broke her fifth rib on right side. She states now her right arm is sore and her right breast is swollen. She can barely use her right arm to do everyday thing due to the pain. Patient is aware we have no available appointments today,but she will be seen tomorrow per Dr.Steve.

## 2018-08-14 ENCOUNTER — Encounter: Payer: Self-pay | Admitting: Family Medicine

## 2018-08-14 ENCOUNTER — Other Ambulatory Visit: Payer: Self-pay

## 2018-08-14 ENCOUNTER — Ambulatory Visit (INDEPENDENT_AMBULATORY_CARE_PROVIDER_SITE_OTHER): Payer: Medicare HMO | Admitting: Family Medicine

## 2018-08-14 VITALS — BP 124/80 | Temp 98.3°F | Ht 63.0 in | Wt 253.0 lb

## 2018-08-14 DIAGNOSIS — M7541 Impingement syndrome of right shoulder: Secondary | ICD-10-CM

## 2018-08-14 NOTE — Progress Notes (Signed)
   Subjective:    Patient ID: [name], female    DOB: September 14, 1955, 63 y.o.   MRN: [id number]  HPIfell down 3 steps and landed on concrete on may 1st. [facility] and [facility]. Pt states she is still having pain in right ribs, right arm and right breast was swollen but is better today.   Patient also notes substantial shoulder pain.  Particular with certain motions.  Using PRN anti-inflammatory medications.  Was frustrated about prolonged recovery and ongoing shoulder challenges.  Also the lateral chest wall pain.  No cough no fever no hemoptysis  Review of Systems     Objective:   Physical Exam   Alert active no acute distress lungs clear heart rate and rhythm right shoulder impingement sign right lateral chest wall significant tender to deep palpation     Assessment & Plan:  Impression probable rotator cuff injury hopefully mild in nature, Codman's exercises recommended, would give this more time x-ray was normal.  2.  Rib fracture.  Discussed.  Symptom care discussed expect ongoing resolution  Follow-up in July for chronic concerns

## 2018-08-15 ENCOUNTER — Telehealth: Payer: Self-pay | Admitting: Obstetrics & Gynecology

## 2018-08-15 NOTE — Telephone Encounter (Signed)
I looked in Dr. Marjory Lies note. It's not an [place] but a screening mammogram. Pt can schedule her own appt. Pt aware and voiced understanding. [place]

## 2018-08-15 NOTE — Telephone Encounter (Signed)
Patient called stating that she came to see Dr. Rip Harbour not to long ago and he was suppose to place her an order in [facility] for her to have an US of the breast but she states that they told her they never received the order. Please contact pt

## 2018-08-30 ENCOUNTER — Other Ambulatory Visit (HOSPITAL_COMMUNITY): Payer: Self-pay | Admitting: Family Medicine

## 2018-08-30 DIAGNOSIS — Z1231 Encounter for screening mammogram for malignant neoplasm of breast: Secondary | ICD-10-CM

## 2018-09-20 ENCOUNTER — Telehealth: Payer: Self-pay | Admitting: Family Medicine

## 2018-09-20 ENCOUNTER — Other Ambulatory Visit: Payer: Self-pay | Admitting: Family Medicine

## 2018-09-20 DIAGNOSIS — M25511 Pain in right shoulder: Secondary | ICD-10-CM

## 2018-09-20 DIAGNOSIS — H5213 Myopia, bilateral: Secondary | ICD-10-CM | POA: Diagnosis not present

## 2018-09-20 DIAGNOSIS — H524 Presbyopia: Secondary | ICD-10-CM | POA: Diagnosis not present

## 2018-09-20 DIAGNOSIS — H52223 Regular astigmatism, bilateral: Secondary | ICD-10-CM | POA: Diagnosis not present

## 2018-09-20 DIAGNOSIS — H35363 Drusen (degenerative) of macula, bilateral: Secondary | ICD-10-CM | POA: Diagnosis not present

## 2018-09-20 NOTE — Telephone Encounter (Signed)
Patient is requesting MRI for shoulder pain ,she was seen 08/14/18 and shoulder is not any better. Please advise

## 2018-09-23 ENCOUNTER — Other Ambulatory Visit: Payer: Self-pay

## 2018-09-23 ENCOUNTER — Ambulatory Visit (HOSPITAL_COMMUNITY)
Admission: RE | Admit: 2018-09-23 | Discharge: 2018-09-23 | Disposition: A | Discharge status: Home / Self Care | Payer: Medicare HMO | Source: Ambulatory Visit | Attending: Family Medicine | Admitting: Family Medicine

## 2018-09-23 DIAGNOSIS — Z1231 Encounter for screening mammogram for malignant neoplasm of breast: Secondary | ICD-10-CM | POA: Insufficient documentation

## 2018-09-23 NOTE — Telephone Encounter (Signed)
Ok lets do mri, let her know if her insur co gives [place] grief will need to be reseen face to face for an exam to confirm need

## 2018-09-24 NOTE — Telephone Encounter (Signed)
MRI order placed. Left message to return call

## 2018-09-24 NOTE — Telephone Encounter (Signed)
Pt informed MRI order has been placed and if insurance gives [place] push back we will bring her in for another OV

## 2018-10-03 ENCOUNTER — Ambulatory Visit: Payer: Medicare HMO | Admitting: Family Medicine

## 2018-10-07 ENCOUNTER — Ambulatory Visit (HOSPITAL_COMMUNITY)
Admission: RE | Admit: 2018-10-07 | Discharge: 2018-10-07 | Disposition: A | Discharge status: Home / Self Care | Payer: Medicare HMO | Source: Ambulatory Visit | Attending: Family Medicine | Admitting: Family Medicine

## 2018-10-07 ENCOUNTER — Other Ambulatory Visit: Payer: Self-pay

## 2018-10-07 DIAGNOSIS — M25511 Pain in right shoulder: Secondary | ICD-10-CM | POA: Diagnosis not present

## 2018-10-08 ENCOUNTER — Encounter: Payer: Self-pay | Admitting: Family Medicine

## 2018-10-08 ENCOUNTER — Ambulatory Visit (INDEPENDENT_AMBULATORY_CARE_PROVIDER_SITE_OTHER): Payer: Medicare HMO | Admitting: Family Medicine

## 2018-10-08 DIAGNOSIS — S46019A Strain of muscle(s) and tendon(s) of the rotator cuff of unspecified shoulder, initial encounter: Secondary | ICD-10-CM

## 2018-10-08 DIAGNOSIS — E039 Hypothyroidism, unspecified: Secondary | ICD-10-CM | POA: Diagnosis not present

## 2018-10-08 DIAGNOSIS — M25511 Pain in right shoulder: Secondary | ICD-10-CM

## 2018-10-08 DIAGNOSIS — E119 Type 2 diabetes mellitus without complications: Secondary | ICD-10-CM | POA: Diagnosis not present

## 2018-10-08 DIAGNOSIS — I10 Essential (primary) hypertension: Secondary | ICD-10-CM

## 2018-10-08 DIAGNOSIS — Z79899 Other long term (current) drug therapy: Secondary | ICD-10-CM | POA: Diagnosis not present

## 2018-10-08 MED ORDER — THYROID 30 MG PO TABS: 60.0000 mg | ORAL_TABLET | Freq: Every day | ORAL | 5 refills | Status: DC

## 2018-10-08 MED ORDER — ESCITALOPRAM OXALATE 10 MG PO TABS: ORAL_TABLET | ORAL | 5 refills | Status: DC

## 2018-10-08 NOTE — Progress Notes (Signed)
   Subjective:    Patient ID: [name], female    DOB: 1955/08/16, 63 y.o.   MRN: [id number] Audio plus visual  Patient presents with numerous concerns Diabetes She presents for her follow-up diabetic visit. There are no hypoglycemic associated symptoms. There are no diabetic associated symptoms. There are no hypoglycemic complications. There are no diabetic complications. Eye exam is current (pt states she had eye exam and they stated that they see no sign of diabetes).  Hypertension This is a chronic problem. There are no compliance problems.    Pt states she has not checked her BP is a while. Pt does not check sugar. Pt states she has been staying tired a lot since she retired.   Virtual Visit via Video Note  I connected with [name] on 10/08/18 at 10:00 AM EDT by a video enabled telemedicine application and verified that I am speaking with the correct person using two identifiers.  Location: Patient: home Provider: office   I discussed the limitations of evaluation and management by telemedicine and the availability of in person appointments. The patient expressed understanding and agreed to proceed.  History of Present Illness:    Observations/Objective:   Assessment and Plan:   Follow Up Instructions:    I discussed the assessment and treatment plan with the patient. The patient was provided an opportunity to ask questions and all were answered. The patient agreed with the plan and demonstrated an understanding of the instructions.   The patient was advised to call back or seek an in-person evaluation if the symptoms worsen or if the condition fails to improve as anticipated.  I provided 25 minutes of non-face-to-face time during this encounter.   Vicente Males, LPN  Blood pressure medicine and blood pressure levels reviewed today with patient. Compliant with blood pressure medicine. States does not miss a dose. No obvious side effects. Blood pressure generally  good when checked elsewhere. Watching salt intake.   Patient claims compliance with diabetes medication. No obvious side effects. Reports no substantial low sugar spells. Most numbers are generally in good range when checked fasting. Generally does not miss a dose of medication. Watching diabetic diet closely  Hypothyroidism.  Patient compliant.  Tired a lot.  Patient also just had MRI of shoulder.  See prior notes.  Interested in results and what to do now  Review of Systems No headache, no major weight loss or weight gain, no chest pain no back pain abdominal pain no change in bowel habits complete ROS otherwise negative     Objective:   Physical Exam  Virtual      Assessment & Plan:  Impression 1 type 2 diabetes control uncertain will check an A1c further recommendations based results  2.  Hypothyroidism control uncertain will check TSH compliance discussed  3.  Hypertension.  Blood pressure has been good in the past  4.  MRI findings.  Unfortunately complete tear rotator cuff noted.  Discussed at length orthopedic referral warranted discussed we will press on follow-up in 6 months

## 2018-10-08 NOTE — Addendum Note (Signed)
Addended by: Vicente Males on: 10/08/2018 01:20 PM   Modules accepted: Orders

## 2018-10-10 ENCOUNTER — Encounter: Payer: Self-pay | Admitting: Orthopedic Surgery

## 2018-10-10 ENCOUNTER — Ambulatory Visit (INDEPENDENT_AMBULATORY_CARE_PROVIDER_SITE_OTHER): Payer: Medicare HMO | Admitting: Orthopedic Surgery

## 2018-10-10 ENCOUNTER — Other Ambulatory Visit: Payer: Self-pay

## 2018-10-10 DIAGNOSIS — S46011A Strain of muscle(s) and tendon(s) of the rotator cuff of right shoulder, initial encounter: Secondary | ICD-10-CM | POA: Diagnosis not present

## 2018-10-11 ENCOUNTER — Encounter: Payer: Self-pay | Admitting: Orthopedic Surgery

## 2018-10-11 NOTE — Progress Notes (Signed)
Office Visit Note   Patient: [name]           Date of Birth: 10-Nov-1955           MRN: [id number] Visit Date: 10/10/2018 Requested by: Mikey Kirschner, [address] [place],  [place] [address] PCP: Mikey Kirschner, MD  Subjective: Chief Complaint  Patient presents with  . Right Knee - Pain    HPI: Pt is a 63 y.o. Female who presents to the clinic complaining of R shoulder pain. On 07/26/18, she fell off of her porch (height of ~3 feet) and landed on her R side, fracturing one rib and injuring her shoulder.  Since then, she complains of R shoulder pain and weakness.  Pain is worse at night and wakes her up.  She has difficulty with moving her arm away from her body and especially with pushing things in front of her, such as doors or drawers.  She has never had a previous injury or surgery on the R shoulder.  She has tried HEP with little improvement.                ROS: All systems reviewed are negative as they relate to the chief complaint within the history of present illness.  Patient denies  fevers or chills.   Assessment & Plan: Visit Diagnoses: No diagnosis found.  Plan: Pt is 2.5 months out from injuring her rotator cuff.  Pt requests surgery, as the R shoulder is making daily life difficult.  Due to the retraction of the tears, it will be difficult to mobilize the RTC tendons. I discussed options with patient, including the likelihood that patient's RTC is not repairable with a typical approach.  I think that patient is too young for a reverse shoulder replacement at this point. Pt understands.  Offered RTC repair vs. Lower trapezius tendon transfer.  Discussed the recovery from such a tendon transfer and patient vocalized understanding.  Will proceed with RTC vs. LTTT.  Pt will f/u with the office after surgery.    Follow-Up Instructions: No follow-ups on file.   Orders:  No orders of the defined types were placed in this encounter.  No orders of the defined  types were placed in this encounter.     Procedures: No procedures performed   Clinical Data: No additional findings.  Objective: Vital Signs: There were no vitals taken for this visit.  Physical Exam:   Constitutional: Patient appears well-developed HEENT:  Head: Normocephalic Eyes:EOM are normal Neck: Normal range of motion Cardiovascular: Normal rate Pulmonary/chest: Effort normal Neurologic: Patient is alert Skin: Skin is warm Psychiatric: Patient has normal mood and affect    Ortho Exam:  Passive ER of R shoulder equivalent to the contralateral side TTP over the bicipital groove and AC joint of R shoulder Marked weakness with infraspinatus and supraspinatus resistance testing, especially infraspinatus Good subscapularis strength 5/5 grip strength, pronation/supination strength of the RUE 2+ radial pulse of RUE    Specialty Comments:  No specialty comments available.  Imaging: No results found.   PMFS History: Patient Active Problem List   Diagnosis Date Noted  . Upper airway cough syndrome 07/30/2018  . Morbid (severe) obesity due to excess calories ([place]) 07/30/2018  . Urinary, incontinence, stress female 05/15/2018  . Encounter for screening breast examination 05/15/2018  . Depression, major, single episode, moderate ([place]) 10/17/2017  . Hypothyroidism 09/19/2017  . Special screening for malignant neoplasms, colon   . Moody 12/30/2014  .  Counseling for estrogen replacement therapy 12/30/2014  . 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 Mixed stress and urge urinary incontinence 02/10/2014  . Diabetes ([place]) 01/18/2014  . Posterior tibialis muscle dysfunction 10/24/2013  . Hyperlipidemia LDL goal <100 03/10/2013  . Superficial fungus infection of skin 07/05/2012  . Hot flashes 07/05/2012  . Rectocele 07/05/2012  . Essential hypertension 07/04/2012  . Hx of cervical cancer 07/04/2012  . Stiffness of joint, lower leg, left 08/28/2011  . Difficulty  in walking(719.7) 08/28/2011  . Pain in left knee 08/28/2011   Past Medical History:  Diagnosis Date  . Arthritis    "fingers occasionally" (08/26/2014)  . Asthmatic bronchitis    "haven't used an inhaler in years" (08/26/2014)  . Cervical cancer ([place])   . Counseling for estrogen replacement therapy 12/30/2014  . Diabetes mellitus without complication ([place])    "borderline"  . Elevated cholesterol   . Exertional shortness of breath   . Family history of adverse reaction to anesthesia    "I think my mama gets sick"  . Hot flashes 07/05/2012  . Hypertension   . IFG (impaired fasting glucose)   . Mixed stress and urge urinary incontinence   . Moody 12/30/2014  . Obesity   . Panniculus   . PONV (postoperative nausea and vomiting)   . Rectocele 07/05/2012  . Stress incontinence   . Superficial fungus infection of skin     Family History  Problem Relation Age of Onset  . Heart attack Father   . Stroke Mother   . Diabetes Brother   . Hypertension Brother   . Diabetes Brother   . Hypertension Brother   . Diabetes Maternal Grandmother   . Cancer Maternal Aunt        cervical  . Diabetes Paternal Grandmother     Past Surgical History:  Procedure Laterality Date  . ABDOMINAL HYSTERECTOMY  1986   "partial"  . ANKLE FUSION Left 10/24/2013   Procedure: LEFT ANKLE SUBTALAR AND TALONAVICULAR FUSION;  Surgeon: Newt Minion, MD;  Location: [facility];  Service: Orthopedics;  Laterality: Left;  . ANKLE FUSION Left 08/26/2014   Procedure: Left Foot Take Down Non-union with Revision Talonavicular and Subtalar Fusion;  Surgeon: Newt Minion, MD;  Location: [facility];  Service: Orthopedics;  Laterality: Left;  . BACK SURGERY    . BILATERAL SALPINGECTOMY  2010   w/LOA  . COLONOSCOPY    . COLONOSCOPY N/A 07/14/2016   Procedure: COLONOSCOPY;  Surgeon: Danie Binder, MD;  [facility];  Service: Endoscopy;  Laterality: N/A;  8:30  . FUSION OF TALONAVICULAR JOINT Left    Take Down Non-union with  Revision Talonavicular and Subtalar Fusion /notes 08/26/2014  . HAMMER TOE SURGERY Bilateral 2000-2013   right-left  . JOINT REPLACEMENT    . KNEE ARTHROSCOPY Bilateral 2008-2010    left-right  . LUMBAR DISC SURGERY  2004   Lumbar 4- 5  . POLYPECTOMY  07/14/2016   Procedure: POLYPECTOMY;  Surgeon: Danie Binder, MD;  [facility];  Service: Endoscopy;;  hepatic flexure  . TOTAL KNEE ARTHROPLASTY Right 2013   Social History   Occupational History  . Not on file  Tobacco Use  . Smoking status: Former Smoker    Packs/day: 0.50    Years: 10.00    Pack years: 5.00    Types: Cigarettes    Quit date: 03/27/1988    Years since quitting: 30.5  . Smokeless tobacco: Never Used  Substance and  Sexual Activity  . Alcohol use: Yes    Comment: occasionally a beer  . Drug use: No  . Sexual activity: Not Currently    Birth control/protection: Surgical    Comment: hyst

## 2018-10-12 ENCOUNTER — Other Ambulatory Visit: Payer: Self-pay | Admitting: Internal Medicine

## 2018-10-15 ENCOUNTER — Other Ambulatory Visit: Payer: Self-pay | Admitting: Family Medicine

## 2018-10-16 NOTE — Telephone Encounter (Signed)
k six mo worth

## 2018-10-17 DIAGNOSIS — I10 Essential (primary) hypertension: Secondary | ICD-10-CM | POA: Diagnosis not present

## 2018-10-17 DIAGNOSIS — E039 Hypothyroidism, unspecified: Secondary | ICD-10-CM | POA: Diagnosis not present

## 2018-10-17 DIAGNOSIS — Z79899 Other long term (current) drug therapy: Secondary | ICD-10-CM | POA: Diagnosis not present

## 2018-10-17 DIAGNOSIS — E119 Type 2 diabetes mellitus without complications: Secondary | ICD-10-CM | POA: Diagnosis not present

## 2018-10-18 LAB — HEPATIC FUNCTION PANEL
ALT: 30 IU/L (ref 0–32)
AST: 31 IU/L (ref 0–40)
Albumin: 4.6 g/dL (ref 3.8–4.8)
Alkaline Phosphatase: 89 IU/L (ref 39–117)
Bilirubin Total: 0.4 mg/dL (ref 0.0–1.2)
Bilirubin, Direct: 0.12 mg/dL (ref 0.00–0.40)
Total Protein: 7.3 g/dL (ref 6.0–8.5)

## 2018-10-18 LAB — BASIC METABOLIC PANEL
BUN/Creatinine Ratio: 16 (ref 12–28)
BUN: 14 mg/dL (ref 8–27)
CO2: 23 mmol/L (ref 20–29)
Calcium: 9.9 mg/dL (ref 8.7–10.3)
Chloride: 96 mmol/L (ref 96–106)
Creatinine, Ser: 0.86 mg/dL (ref 0.57–1.00)
GFR calc Af Amer: 84 mL/min/{1.73_m2} (ref >59)
GFR calc non Af Amer: 73 mL/min/{1.73_m2} (ref >59)
Glucose: 385 mg/dL — ABNORMAL HIGH (ref 65–99)
Potassium: 4.8 mmol/L (ref 3.5–5.2)
Sodium: 137 mmol/L (ref 134–144)

## 2018-10-18 LAB — LIPID PANEL
Chol/HDL Ratio: 4.1 ratio (ref 0.0–4.4)
Cholesterol, Total: 215 mg/dL — ABNORMAL HIGH (ref 100–199)
HDL: 52 mg/dL (ref >39)
LDL Calculated: 122 mg/dL — ABNORMAL HIGH (ref 0–99)
Triglycerides: 204 mg/dL — ABNORMAL HIGH (ref 0–149)
VLDL Cholesterol Cal: 41 mg/dL — ABNORMAL HIGH (ref 5–40)

## 2018-10-18 LAB — MICROALBUMIN / CREATININE URINE RATIO
Creatinine, Urine: 68.1 mg/dL
Microalb/Creat Ratio: 34 mg/g creat — ABNORMAL HIGH (ref 0–29)
Microalbumin, Urine: 23.1 ug/mL

## 2018-10-18 LAB — TSH: TSH: 1.76 u[IU]/mL (ref 0.450–4.500)

## 2018-10-18 LAB — HEMOGLOBIN A1C
Est. average glucose Bld gHb Est-mCnc: 369 mg/dL
Hgb A1c MFr Bld: 14.5 % — ABNORMAL HIGH (ref 4.8–5.6)

## 2018-10-21 ENCOUNTER — Other Ambulatory Visit: Payer: Self-pay | Admitting: *Deleted

## 2018-10-21 ENCOUNTER — Other Ambulatory Visit: Payer: Self-pay | Admitting: Internal Medicine

## 2018-10-21 MED ORDER — METFORMIN HCL 500 MG PO TABS: ORAL_TABLET | ORAL | 0 refills | Status: DC

## 2018-10-28 ENCOUNTER — Other Ambulatory Visit: Payer: Self-pay | Admitting: Family Medicine

## 2018-10-28 DIAGNOSIS — R69 Illness, unspecified: Secondary | ICD-10-CM | POA: Diagnosis not present

## 2018-11-04 ENCOUNTER — Telehealth: Payer: Self-pay | Admitting: Internal Medicine

## 2018-11-04 NOTE — Telephone Encounter (Signed)
Assessment & Plan Note by Tanda Rockers, MD at 07/30/2018 12:00 PM Author: Tanda Rockers, MD Author Type: Physician Filed: 07/30/2018 12:01 PM  Note Status: Written Cosign: Cosign Not Required Encounter Date: 07/30/2018  Problem: Essential hypertension  Editor: Tanda Rockers, MD (Physician)    In the best review of chronic cough to date ( NEJM 2016 375 1544-1551) ,  ACEi are now felt to cause cough in up to  20% of pts which is a 4 fold increase from previous reports and does not include the variety of non-specific complaints we see in pulmonary clinic in pts on ACEi but previously attributed to another dx like  Copd/asthma and  include PNDS, throat and chest congestion, "bronchitis", unexplained dyspnea and noct "strangling" sensations, and hoarseness, but also  atypical /refractory GERD symptoms like dysphagia and "bad heartburn"   The only way I know  to prove this is not an "ACEi Case" is a trial off ACEi x a minimum of 4 weeks then regroup.   Try micardis 40-12.5 one daily and refills per PCP if cough and "pnds"  better, f/u here if not.      Called and spoke with pt stating to her after her OV with MW 5/5, it was mentioned for refills of the micardis 40-12.5 to be handled by PCP. Pt said that she contacted PCP office and they told her that they did not know anything in regards to the switch of the BP med and that is why she was calling our office to see if MW would authorize a refill of med.  Dr.Wert, please advise on this for pt. Thanks!

## 2018-11-05 ENCOUNTER — Other Ambulatory Visit: Payer: Self-pay | Admitting: Internal Medicine

## 2018-11-05 MED ORDER — TELMISARTAN-HCTZ 40-12.5 MG PO TABS: 1.0000 | ORAL_TABLET | Freq: Every day | ORAL | 0 refills | Status: DC

## 2018-11-05 NOTE — Telephone Encounter (Signed)
Called and spoke w/ pt to let her know her Micardis BP medication has been sent to her preferred pharmacy for a 30-day supply and that we would need to schedule an appt for further refills. Pt verbalized understanding and agreed to setting up an appt for 11/26/2018 at 2:45 PM EDT with MW. Appt has been scheduled. Nothing further needed at this time.

## 2018-11-05 NOTE — Telephone Encounter (Signed)
Ok to refill x one and see me before this runs out.

## 2018-11-05 NOTE — Telephone Encounter (Signed)
Pt returning call.  [phone].

## 2018-11-05 NOTE — Pre-Procedure Instructions (Signed)
[id number] - [place], [place] [address] [place] [address] Phone: [phone] Fax: [phone]  [place] [address] [place] [address] [place] [address] [place] [address] Phone: [phone] Fax: [phone]  [place], [place] [address] Phone: [phone] Fax: [phone]      Your procedure is scheduled on 11-12-18  Report to [facility] Main Entrance "A" at [place].M., and check in at the Admitting office.  Call this number if you have problems the morning of surgery:  [phone]  Call [phone] if you have any questions prior to your surgery date Monday-Friday 8am-4pm    Remember:  Do not eat after midnight the night before your surgery  You may drink clear liquids until 0745 AM the morning of your surgery.   Clear liquids allowed are: Water, Non-Citrus Juices (without pulp), Carbonated Beverages, Clear Tea, Black Coffee Only, and Gatorade  Please complete your PRE-SURGERY G-2 that was provided to you by 0745AM the morning of surgery.  Please, if able, drink it in one setting. DO NOT SIP.   Take these medicines the morning of surgery with A SIP OF WATER : escitalopram (LEXAPRO) pantoprazole (PROTONIX)  thyroid (ARMOUR)   7 days prior to surgery STOP taking any Aspirin (unless otherwise instructed by your surgeon), Aleve, Naproxen, Ibuprofen, Motrin, Advil, Goody's, BC's, all herbal medications, fish oil, and all vitamins.   WHAT DO I DO ABOUT MY DIABETES MEDICATION?   Marland Kitchen Do not take oral diabetes medicines (pills)METFORMIN  the morning of surgery.  How to Manage Your Diabetes Before and After Surgery  Why is it important to control my blood sugar before and after surgery? . Improving blood sugar levels before and after surgery helps healing and can limit problems. . A way of improving blood sugar control is  eating a healthy diet by: o  Eating less sugar and carbohydrates o  Increasing activity/exercise o  Talking with your doctor about reaching your blood sugar goals . High blood sugars (greater than 180 mg/dL) can raise your risk of infections and slow your recovery, so you will need to focus on controlling your diabetes during the weeks before surgery. . Make sure that the doctor who takes care of your diabetes knows about your planned surgery including the date and location.  How do I manage my blood sugar before surgery? . Check your blood sugar at least 4 times a day, starting 2 days before surgery, to make sure that the level is not too high or low. o Check your blood sugar the morning of your surgery when you wake up and every 2 hours until you get to the Short Stay unit. . If your blood sugar is less than 70 mg/dL, you will need to treat for low blood sugar: o Do not take insulin. o Treat a low blood sugar (less than 70 mg/dL) with  cup of clear juice (cranberry or apple), 4 glucose tablets, OR glucose gel. o Recheck blood sugar in 15 minutes after treatment (to make sure it is greater than 70 mg/dL). If your blood sugar is not greater than 70 mg/dL on recheck, call [phone] for further instructions. . Report your blood sugar to the short stay nurse when you get to Short Stay.  . If you are admitted to the hospital after surgery: o Your blood sugar will be checked by the  staff and you will probably be given insulin after surgery (instead of oral diabetes medicines) to make sure you have good blood sugar levels. o The goal for blood sugar control after surgery is 80-180 mg/dL.   The Morning of Surgery  Do not wear jewelry, make-up or nail polish.  Do not wear lotions, powders, or perfumes, or deodorant  Do not shave 48 hours prior to surgery.   Do not bring valuables to the hospital.  [facility] is not responsible for any belongings or valuables.  If you are a smoker, DO NOT  Smoke 24 hours prior to surgery IF you wear a CPAP at night please bring your mask, tubing, and machine the morning of surgery   Remember that you must have someone to transport you home after your surgery, and remain with you for 24 hours if you are discharged the same day.  Contacts, glasses, hearing aids, dentures or bridgework may not be worn into surgery.   Leave your suitcase in the car.  After surgery it may be brought to your room.  For patients admitted to the hospital, discharge time will be determined by your treatment team.  Patients discharged the day of surgery will not be allowed to drive home.    Special instructions:   [place]- Preparing For Surgery  Before surgery, you can play an important role. Because skin is not sterile, your skin needs to be as free of germs as possible. You can reduce the number of germs on your skin by washing with CHG (chlorahexidine gluconate) Soap before surgery.  CHG is an antiseptic cleaner which kills germs and bonds with the skin to continue killing germs even after washing.    Oral Hygiene is also important to reduce your risk of infection.  Remember - BRUSH YOUR TEETH THE MORNING OF SURGERY WITH YOUR REGULAR TOOTHPASTE  Please do not use if you have an allergy to CHG or antibacterial soaps. If your skin becomes reddened/irritated stop using the CHG.  Do not shave (including legs and underarms) for at least 48 hours prior to first CHG shower. It is OK to shave your face.  Please follow these instructions carefully.   1. Shower the NIGHT BEFORE SURGERY and the MORNING OF SURGERY with CHG Soap.   2. If you chose to wash your hair, wash your hair first as usual with your normal shampoo.  3. After you shampoo, rinse your hair and body thoroughly to remove the shampoo.  4. Use CHG as you would any other liquid soap. You can apply CHG directly to the skin and wash gently with a scrungie or a clean washcloth.   5. Apply the CHG Soap to  your body ONLY FROM THE NECK DOWN.  Do not use on open wounds or open sores. Avoid contact with your eyes, ears, mouth and genitals (private parts). Wash Face and genitals (private parts)  with your normal soap.   6. Wash thoroughly, paying special attention to the area where your surgery will be performed.  7. Thoroughly rinse your body with warm water from the neck down.  8. DO NOT shower/wash with your normal soap after using and rinsing off the CHG Soap.  9. Pat yourself dry with a CLEAN TOWEL.  10. Wear CLEAN PAJAMAS to bed the night before surgery, wear comfortable clothes the morning of surgery  11. Place CLEAN SHEETS on your bed the night of your first shower and DO NOT SLEEP WITH PETS.  Day of Surgery:  Do not apply any deodorants/lotions. Please shower the morning of surgery with the CHG soap  Please wear clean clothes to the hospital/surgery center.   Remember to brush your teeth WITH YOUR REGULAR TOOTHPASTE.  Please read over the fact sheets that you were given.

## 2018-11-05 NOTE — Telephone Encounter (Signed)
Pt is calling back about Rx for BP. Pt also requesting an update be sent to Dr. Wolfgang Phoenix, PCP, about BP med change. Cb is [phone].

## 2018-11-05 NOTE — Telephone Encounter (Signed)
I have sent refill of med to pharmacy for pt. Attempted to call pt to let her know this had been done and also to let her know that we needed to get her scheduled for a f/u appt with MW prior to running out of the med but unable to reach.  Left message for pt to return call. When pt calls back, pt needs to be scheduled a f/u appt with Dr. Melvyn Novas. Thanks!

## 2018-11-06 ENCOUNTER — Telehealth: Payer: Self-pay | Admitting: Family Medicine

## 2018-11-06 ENCOUNTER — Encounter (HOSPITAL_COMMUNITY)
Admission: RE | Admit: 2018-11-06 | Discharge: 2018-11-06 | Disposition: A | Discharge status: Home / Self Care | Payer: Medicare HMO | Source: Ambulatory Visit | Attending: Orthopedic Surgery | Admitting: Orthopedic Surgery

## 2018-11-06 ENCOUNTER — Other Ambulatory Visit: Payer: Self-pay

## 2018-11-06 ENCOUNTER — Encounter (HOSPITAL_COMMUNITY): Payer: Self-pay | Admitting: Vascular Surgery

## 2018-11-06 ENCOUNTER — Encounter (HOSPITAL_COMMUNITY): Payer: Self-pay

## 2018-11-06 DIAGNOSIS — E119 Type 2 diabetes mellitus without complications: Secondary | ICD-10-CM | POA: Insufficient documentation

## 2018-11-06 DIAGNOSIS — Z01818 Encounter for other preprocedural examination: Secondary | ICD-10-CM | POA: Diagnosis not present

## 2018-11-06 HISTORY — DX: Hypothyroidism, unspecified: E03.9

## 2018-11-06 LAB — GLUCOSE, CAPILLARY: Glucose-Capillary: 295 mg/dL — ABNORMAL HIGH (ref 70–99)

## 2018-11-06 NOTE — Telephone Encounter (Signed)
Pt contacted and verbalized understanding. Pt transferred up front to set up appt  

## 2018-11-06 NOTE — Progress Notes (Signed)
  PCP - Dr. Calton Golds Cardiologist - denies  Chest x-ray - denies EKG  - 11/06/2018 Stress Test - denies ECHO - denies Cardiac Cath - denies  Sleep Study - denies CPAP - N/A    Blood Thinner Instructions: N/A Aspirin Instructions: N/A  Anesthesia review: Yes, A1C 14.5 and BG 295 today.    Coronavirus Screening  Have you experienced the following symptoms:  Cough yes/no: No Fever (>100.3F)  yes/no: No Runny nose yes/no: No Sore throat yes/no: No Difficulty breathing/shortness of breath  yes/no: No  Have you or a family member traveled in the last 14 days and where? yes/no: No   If the patient indicates "YES" to the above questions, their PAT will be rescheduled to limit the exposure to others and, the surgeon will be notified. THE PATIENT WILL NEED TO BE ASYMPTOMATIC FOR 14 DAYS.   If the patient is not experiencing any of these symptoms, the PAT nurse will instruct them to NOT bring anyone with them to their appointment since they may have these symptoms or traveled as well.   Please remind your patients and families that hospital visitation restrictions are in effect and the importance of the restrictions.   Patient denies shortness of breath, fever, cough and chest pain at PAT appointment  Patient verbalized understanding of instructions that were given to them at the PAT appointment. Patient was also instructed that they will need to review over the PAT instructions again at home before surgery.

## 2018-11-06 NOTE — Telephone Encounter (Signed)
Ov this week

## 2018-11-06 NOTE — Progress Notes (Signed)
Patient arrived to PAT visit.  A1C on 10/17/2018 14.5 and BG today 295.  Ebony Hail, [place] made aware.

## 2018-11-06 NOTE — Telephone Encounter (Signed)
Pt contacted office and states that she went for her Pre-op for shoulder surgery but her sugar was 295; pt states at home her sugar was 342. Pt states they are unable to do her surgery until sugars come down. Pt states she is taking Metformin 1000 mg BID. Pt states she is feeling fine. Checks her sugars every morning. Please advise. Thank you

## 2018-11-06 NOTE — Progress Notes (Signed)
Anesthesia Chart Review:  Case: [id number] Date/Time: 11/12/18 1029   Procedure: right shoulde arthroscopy, biceps tenodesis, mini open rotator cuff repair vs lower trapezius tendon transfer (Right )   Anesthesia type: General   Pre-op diagnosis: right shoulder massive rotator cuff tear   Location: [facility] OR ROOM 06 / [facility]   Surgeon: Meredith Pel, MD      DISCUSSION: Patient is a 63 year old female that is currently scheduled for the above procedure; however recent hemoglobin A1c showed a dramatic increase from 6.8% on 04/22/18 to 14.5% on 10/17/18. Dr. Wolfgang Phoenix started her on metformin. She reports she is now checking home CBGs which were initially ~ 500, but are starting to improve in the 200's. I reviewed A1c result with Dr. Marlou Sa and decision made to postpone surgery (probably ~ 6-8 weeks) to allow time for DM to become better controlled. I discussed this with patient, and Dr. Randel Pigg concern for increased infection rate with current hyperglycemia. His office will contact her to reschedule. I also advised her to contact Dr. Wolfgang Phoenix to schedule appointment for preoperative evaluation/DM--she can discuss with him timing given she was just recently started on medication.  History includes former smoker (quit 1990), post-operative N/V, HTN, hypercholesterolemia, DM2, exertional dyspnea, hypothyroidism. BMI is consistent with morbid obesity.  EKG done part of PAT visit given her medical history. Labs deferred until she is rescheduled.   VS: BP (!) 132/59   Pulse 81   Temp (!) 36.3 C   Resp 20   Ht 5\' 3"  (1.6 m)   Wt 110 kg   SpO2 96%   BMI 42.94 kg/m  Denied chest pain and SOB at rest. Chronic DOE which she feels is stable. She is able to walk up a flight of stairs "slowly" and clean her house, but has to take breaks which is not new. No orthopnea. No known heart disease. Heart RRR, no murmur noted. Lungs clear. Obese. No ankle edema. No carotid bruits noted.   PROVIDERS: Mikey Kirschner, MD  is PCP   LABS: Deferred since surgery to be rescheduled. Most recent comparison labs include: Lab Results  Component Value Date   WBC 6.2 09/19/2017   HGB 14.4 09/19/2017   HCT 41.4 09/19/2017   PLT 264 09/19/2017   GLUCOSE 385 (H) 10/17/2018   ALT 30 10/17/2018   AST 31 10/17/2018   NA 137 10/17/2018   K 4.8 10/17/2018   CL 96 10/17/2018   CREATININE 0.86 10/17/2018   BUN 14 10/17/2018   CO2 23 10/17/2018   TSH 1.760 10/17/2018   HGBA1C 14.5 (H) 10/17/2018     IMAGES: MRI right shoulder 10/07/18: IMPRESSION: 1. Full-thickness, retracted tears of the supraspinatus and infraspinatus tendons. 2. Humeral head is subluxed posteriorly and superiorly relative to the glenoid. 3. Moderate joint effusion with synovitis. 4. Intra-articular biceps tendinosis.   EKG: 11/06/18: NSR. Non-specific T wave abnormality in III, aVF.    CV: Reports normal stress or echo > 10 years ago.   Past Medical History:  Diagnosis Date  . Arthritis    "fingers occasionally" (08/26/2014)  . Asthmatic bronchitis    "haven't used an inhaler in years" (08/26/2014)  . Cervical cancer ([place])   . Counseling for estrogen replacement therapy 12/30/2014  . Diabetes mellitus without complication ([place])    "borderline"  . Elevated cholesterol   . Exertional shortness of breath   . Family history of adverse reaction to anesthesia    "I think my mama gets sick"  .  Hot flashes 07/05/2012  . Hypertension   . Hypothyroidism   . IFG (impaired fasting glucose)   . Mixed stress and urge urinary incontinence   . [place] 12/30/2014  . Obesity   . Panniculus   . PONV (postoperative nausea and vomiting)   . Rectocele 07/05/2012  . Stress incontinence   . Superficial fungus infection of skin     Past Surgical History:  Procedure Laterality Date  . ABDOMINAL HYSTERECTOMY  1986   "partial"  . ANKLE FUSION Left 10/24/2013   Procedure: LEFT ANKLE SUBTALAR AND TALONAVICULAR FUSION;  Surgeon: Newt Minion, MD;   Location: [facility];  Service: Orthopedics;  Laterality: Left;  . ANKLE FUSION Left 08/26/2014   Procedure: Left Foot Take Down Non-union with Revision Talonavicular and Subtalar Fusion;  Surgeon: Newt Minion, MD;  Location: [facility];  Service: Orthopedics;  Laterality: Left;  . BACK SURGERY    . BILATERAL SALPINGECTOMY  2010   w/LOA  . COLONOSCOPY    . COLONOSCOPY N/A 07/14/2016   Procedure: COLONOSCOPY;  Surgeon: Danie Binder, MD;  [facility];  Service: Endoscopy;  Laterality: N/A;  8:30  . FUSION OF TALONAVICULAR JOINT Left    Take Down Non-union with Revision Talonavicular and Subtalar Fusion /notes 08/26/2014  . HAMMER TOE SURGERY Bilateral 2000-2013   right-left  . JOINT REPLACEMENT    . KNEE ARTHROSCOPY Bilateral 2008-2010    left-right  . LUMBAR DISC SURGERY  2004   Lumbar 4- 5  . POLYPECTOMY  07/14/2016   Procedure: POLYPECTOMY;  Surgeon: Danie Binder, MD;  [facility];  Service: Endoscopy;;  hepatic flexure  . TOTAL KNEE ARTHROPLASTY Right 2013    MEDICATIONS: . escitalopram (LEXAPRO) 10 MG tablet  . lisinopril-hydrochlorothiazide (ZESTORETIC) 10-12.5 MG tablet  . metFORMIN (GLUCOPHAGE) 500 MG tablet  . ONETOUCH DELICA LANCETS 61Y MISC  . ONETOUCH ULTRA test strip  . pantoprazole (PROTONIX) 40 MG tablet  . simvastatin (ZOCOR) 40 MG tablet  . Specialty Vitamins Products (ULTIMATE FAT BURNER PO)  . telmisartan-hydrochlorothiazide (MICARDIS HCT) 40-12.5 MG tablet  . thyroid (ARMOUR) 30 MG tablet   No current facility-administered medications for this encounter.     Myra Gianotti, PA-C Surgical Short Stay/Anesthesiology [facility] Phone [phone] [facility] Phone [phone] 11/06/2018 11:55 AM

## 2018-11-07 ENCOUNTER — Encounter: Payer: Self-pay | Admitting: Family Medicine

## 2018-11-07 ENCOUNTER — Ambulatory Visit (INDEPENDENT_AMBULATORY_CARE_PROVIDER_SITE_OTHER): Payer: Medicare HMO | Admitting: Family Medicine

## 2018-11-07 ENCOUNTER — Other Ambulatory Visit: Payer: Self-pay

## 2018-11-07 VITALS — BP 128/82 | Temp 97.6°F | Wt 242.6 lb

## 2018-11-07 DIAGNOSIS — E119 Type 2 diabetes mellitus without complications: Secondary | ICD-10-CM

## 2018-11-07 MED ORDER — GLIPIZIDE 5 MG PO TABS: 5.0000 mg | ORAL_TABLET | Freq: Two times a day (BID) | ORAL | 1 refills | Status: DC

## 2018-11-07 NOTE — Progress Notes (Signed)
   Subjective:  Audio plus visual  Patient ID: [name], female    DOB: 10/28/1955, 63 y.o.   MRN: [id number]  HPI Patient was scheduled to have a orthopedic surgery.  Was discovered to have a very high A1c.  Claims that she has been compliant with her medication.  See medication chart  States fair control of diet.  But on further history states that she really is not sure what she should be eating.   Patient arrives with elevated sugars. Patient's last HgbA1c done 10/17/18 was 14.5 and her sugars have been running 323-502. Review of Systems No headache, no major weight loss or weight gain, no chest pain no back pain abdominal pain no change in bowel habits complete ROS otherwise negative     Objective:   Physical Exam Virtual       Assessment & Plan:  Impression type 2 diabetes.  Poor control.  Discussed.  Glipizide dosage adjusted.  Metformin compliance discussed.  And maintain.  Potential for hypoglycemia discussed.  Dietitian referral.  Call us in a couple weeks with fasting sugars.  Importance of compliance underlying

## 2018-11-08 ENCOUNTER — Other Ambulatory Visit: Payer: Self-pay

## 2018-11-08 ENCOUNTER — Other Ambulatory Visit (HOSPITAL_COMMUNITY)
Admission: RE | Admit: 2018-11-08 | Discharge: 2018-11-08 | Disposition: A | Discharge status: Home / Self Care | Payer: Medicare HMO | Source: Ambulatory Visit | Attending: Orthopedic Surgery | Admitting: Orthopedic Surgery

## 2018-11-09 ENCOUNTER — Encounter: Payer: Self-pay | Admitting: Family Medicine

## 2018-11-12 ENCOUNTER — Ambulatory Visit (HOSPITAL_COMMUNITY): Admission: RE | Admit: 2018-11-12 | Payer: Medicare HMO | Source: Home / Self Care | Admitting: Orthopedic Surgery

## 2018-11-12 ENCOUNTER — Encounter (HOSPITAL_COMMUNITY): Admission: RE | Payer: Self-pay | Source: Home / Self Care

## 2018-11-12 SURGERY — SHOULDER ARTHROSCOPY WITH SUBACROMIAL DECOMPRESSION, ROTATOR CUFF REPAIR AND BICEP TENDON REPAIR
Anesthesia: General | Laterality: Right

## 2018-11-14 ENCOUNTER — Encounter: Payer: Self-pay | Admitting: Family Medicine

## 2018-11-14 DIAGNOSIS — R69 Illness, unspecified: Secondary | ICD-10-CM | POA: Diagnosis not present

## 2018-11-20 ENCOUNTER — Inpatient Hospital Stay: Payer: Medicare HMO | Admitting: Orthopedic Surgery

## 2018-11-26 ENCOUNTER — Ambulatory Visit (INDEPENDENT_AMBULATORY_CARE_PROVIDER_SITE_OTHER): Payer: Medicare HMO | Admitting: Internal Medicine

## 2018-11-26 ENCOUNTER — Other Ambulatory Visit: Payer: Self-pay | Admitting: Family Medicine

## 2018-11-26 ENCOUNTER — Other Ambulatory Visit: Payer: Self-pay

## 2018-11-26 ENCOUNTER — Encounter: Payer: Self-pay | Admitting: Internal Medicine

## 2018-11-26 DIAGNOSIS — R058 Other specified cough: Secondary | ICD-10-CM

## 2018-11-26 DIAGNOSIS — I10 Essential (primary) hypertension: Secondary | ICD-10-CM | POA: Diagnosis not present

## 2018-11-26 DIAGNOSIS — R05 Cough: Secondary | ICD-10-CM

## 2018-11-26 MED ORDER — TELMISARTAN-HCTZ 40-12.5 MG PO TABS: 1.0000 | ORAL_TABLET | Freq: Every day | ORAL | 1 refills | Status: DC

## 2018-11-26 NOTE — Progress Notes (Signed)
[name], female    DOB: [birth year]     MRN: [id number]   Brief patient profile:  39 yowf quit smoking around 1990 with cough that resolved but noted onset of some seasonal rhinitis spring > fall around 2000 rx with otc and was given inhaler but didn't need it,  then went on cruise out of [facility] in Oct 2019 with severe cough on around day 7th and ever since > eval by ENT c/w reflux > rx helped some but doesn't knock it out so referred to pulmonary clinic 07/30/2018 by Dr  Karin Golden      History of Present Illness  07/30/2018  Pulmonary/ 1st office eval/Libero Puthoff ? On ACEi since 2000  Chief Complaint  Patient presents with  . Pulmonary Consult    Referred by Dr Wolfgang Phoenix. Pt c/o cough since Oct 2019- much improved over the past 2 wks. Cough is non prod.   Dyspnea:  Broke rib on R and more sob since  Cough: better, still having urge to clear throat and hoarse Sleep: on side/ bed is flat  SABA use: not using  Cp p fell off porch "got too busy talking "  07/26/2018 and broke rib on R  rec Stop lisinopril and start micardis 40-12.5 one daily in its place  For the next month:  Pantoprazole (protonix) 40 mg   Take  30-60 min before first meal of the day and Pepcid (famotidine)  20 mg one hour before   bedtime     11/26/2018  f/u ov/Tyniah Kastens re: uacs resolved off acei, still  On GERD rx  Chief Complaint  Patient presents with  . Follow-up    Her cough has resolved.   Dyspnea:  MMRC1 = can walk nl pace, flat grade, can't hurry or go uphills or steps s sob   Cough: none Sleeping: main issue nasal congestion one side at a time sleeps with pets  SABA use: saba  02: no    No obvious day to day or daytime variability or assoc excess/ purulent sputum or mucus plugs or hemoptysis or cp or chest tightness, subjective wheeze or overt   hb symptoms.   Sleeping  without nocturnal  or early am exacerbation  of respiratory  c/o's or need for noct saba. Also denies any obvious fluctuation of symptoms with  weather or environmental changes or other aggravating or alleviating factors except as outlined above   No unusual exposure hx or h/o childhood pna/ asthma or knowledge of premature birth.  Current Allergies, Complete Past Medical History, Past Surgical History, Family History, and Social History were reviewed in [employer] record.  ROS  The following are not active complaints unless bolded Hoarseness, sore throat, dysphagia, dental problems, itching, sneezing,  nasal congestion or discharge of excess mucus or purulent secretions, ear ache,   fever, chills, sweats, unintended wt loss or wt gain, classically pleuritic or exertional cp,  orthopnea pnd or arm/hand swelling  or leg swelling, presyncope, palpitations, abdominal pain, anorexia, nausea, vomiting, diarrhea  or change in bowel habits or change in bladder habits, change in stools or change in urine, dysuria, hematuria,  rash, arthralgias, visual complaints, headache, numbness, weakness or ataxia or problems with walking or coordination,  change in mood or  memory.        Current Meds  Medication Sig  . escitalopram (LEXAPRO) 10 MG tablet TAKE 1 TABLET BY MOUTH EVERY MORNING  . famotidine (PEPCID) 20 MG tablet Take 20 mg by  mouth daily.  Marland Kitchen glipiZIDE (GLUCOTROL) 5 MG tablet Take 1 tablet (5 mg total) by mouth 2 (two) times daily before a meal.  . metFORMIN (GLUCOPHAGE) 500 MG tablet Take one tablet bid for 3 days, then start  2 bid. (Patient taking differently: Take 1,000 mg by mouth 2 (two) times daily with a meal. )  . OneTouch Delica Lancets 99991111 MISC USE TO OBTAIN A BLOOD SPECIMEN TO TEST BLOOD SUGAR  . ONETOUCH ULTRA test strip USE TO TEST BLOOD SUGAR DAILY  . pantoprazole (PROTONIX) 40 MG tablet TAKE 1 TABLET (40 MG TOTAL) BY MOUTH DAILY. TAKE 30-60 MIN BEFORE FIRST MEAL OF THE DAY  . simvastatin (ZOCOR) 40 MG tablet TAKE 1 TABLET (40 MG TOTAL) BY MOUTH DAILY AT 6 PM.  . Specialty Vitamins Products (ULTIMATE FAT  BURNER PO) Take 1 capsule by mouth daily.  Marland Kitchen telmisartan-hydrochlorothiazide (MICARDIS HCT) 40-12.5 MG tablet TAKE 1 TABLET BY MOUTH EVERY DAY  . thyroid (ARMOUR) 30 MG tablet Take 2 tablets (60 mg total) by mouth daily before breakfast.                 Past Medical History:  Diagnosis Date  . Arthritis    "fingers occasionally" (08/26/2014)  . Asthmatic bronchitis    "haven't used an inhaler in years" (08/26/2014)  . Cervical cancer ([place])   . Counseling for estrogen replacement therapy 12/30/2014  . Diabetes mellitus without complication ([place])    "borderline"  . Elevated cholesterol   . Exertional shortness of breath   . Family history of adverse reaction to anesthesia    "I think my mama gets sick"  . Hot flashes 07/05/2012  . Hypertension   . IFG (impaired fasting glucose)   . Mixed stress and urge urinary incontinence   . Moody 12/30/2014  . Obesity   . Panniculus   . PONV (postoperative nausea and vomiting)   . Rectocele 07/05/2012  . Stress incontinence   . Superficial fungus infection of skin       Objective:     amb obese wf nad   Wt Readings from Last 3 Encounters:  11/26/18 241 lb (109.3 kg)  11/07/18 242 lb 9.6 oz (110 kg)  11/06/18 242 lb 6.4 oz (110 kg)     Vital signs reviewed - Note on arrival 02 sats  96% on RA and bp 136/74      HEENT: nl dentition, turbinates bilaterally, and oropharynx. Nl external ear canals without cough reflex   NECK :  without JVD/Nodes/TM/ nl carotid upstrokes bilaterally   LUNGS: no acc muscle use,  Nl contour chest which is clear to A and P bilaterally without cough on insp or exp maneuvers   CV:  RRR  no s3 or murmur or increase in P2, and no edema   ABD: obese  soft and nontender with nl inspiratory excursion in the supine position. No bruits or organomegaly appreciated, bowel sounds nl  MS:  Nl gait/ ext warm without deformities, calf tenderness, cyanosis or clubbing No obvious joint restrictions   SKIN: warm and  dry without lesions    NEURO:  alert, approp, nl sensorium with  no motor or cerebellar deficits apparent.           Assessment

## 2018-11-26 NOTE — Patient Instructions (Addendum)
You have 6 months supply of micardis and be sure to discuss further refills at your next visit with Dr Wolfgang Phoenix   If you are satisfied with your treatment plan,  let your doctor know and he/she can either refill your medications or you can return here when your prescription runs out.     If in any way you are not 100% satisfied,  please tell us.  If 100% better, tell your friends!  Pulmonary follow up is as needed   Add : let her know ok to try off protonix and just take pepcid 20 mg bid prn otc to see if does just as well (unless otherwise instructed by Moro)

## 2018-11-27 ENCOUNTER — Telehealth: Payer: Self-pay | Admitting: *Deleted

## 2018-11-27 ENCOUNTER — Encounter: Payer: Self-pay | Admitting: Internal Medicine

## 2018-11-27 DIAGNOSIS — R69 Illness, unspecified: Secondary | ICD-10-CM | POA: Diagnosis not present

## 2018-11-27 NOTE — Telephone Encounter (Signed)
LMTCB

## 2018-11-27 NOTE — Assessment & Plan Note (Signed)
D/c acei 07/30/2018 due to UACS> resolved   Adequate control on present rx, reviewed in detail with pt > no change in rx needed    Although even in retrospect it may not be clear the ACEi contributed to the pt's symptoms,  Pt improved off them and adding them back at this point or in the future would risk confusion in interpretation of non-specific respiratory symptoms to which this patient is prone  ie  Better not to muddy the waters here. > continue arb at  Dr Lance Sell discretion

## 2018-11-27 NOTE — Telephone Encounter (Signed)
-----   Message from Tanda Rockers, MD sent at 11/27/2018  6:49 AM EDT -----  let her know ok to try off protonix and just take pepcid 20 mg bid prn otc to see if does just as well (unless otherwise instructed by Wolfgang Phoenix)

## 2018-11-27 NOTE — Assessment & Plan Note (Signed)
Body mass index is 42.69 kg/m.  -  trending no change  Lab Results  Component Value Date   TSH 1.760 10/17/2018     Contributing to gerd risk/ doe/reviewed the need and the process to achieve and maintain neg calorie balance > defer f/u primary care including intermittently monitoring thyroid status     I had an extended discussion with the patient reviewing all relevant studies completed to date and  lasting 15 to 20 minutes of a 25 minute summary final office visit    Each maintenance medication was reviewed in detail including most importantly the difference between maintenance and prns and under what circumstances the prns are to be triggered using an action plan format that is not reflected in the computer generated alphabetically organized AVS.     Please see AVS for specific instructions unique to this visit that I personally wrote and verbalized to the the pt in detail and then reviewed with pt  by my nurse highlighting any  changes in therapy recommended at today's visit to their plan of care.

## 2018-11-27 NOTE — Assessment & Plan Note (Signed)
On ACEi since ? 2000 with pnds seasonally > try off 07/30/2018 and on max gerd rx > cough resolved  ? If still needs ppi :  Discussed the recent press about ppi's in the context of a statistically significant (but questionably clinically relevant) increase in CRI in pts on ppi vs h2's > bottom line is the lowest dose of ppi that controls   gerd is the right dose and if that dose is zero that's fine esp since h2's are cheaper.    try wean to pepcid prn, f/u PCP/ gi prn.

## 2018-11-28 ENCOUNTER — Encounter: Payer: Medicare HMO | Attending: Family Medicine | Admitting: Nutrition

## 2018-11-28 ENCOUNTER — Other Ambulatory Visit: Payer: Self-pay

## 2018-11-28 ENCOUNTER — Other Ambulatory Visit: Payer: Self-pay | Admitting: Family Medicine

## 2018-11-28 VITALS — Ht 63.0 in | Wt 240.0 lb

## 2018-11-28 DIAGNOSIS — E785 Hyperlipidemia, unspecified: Secondary | ICD-10-CM | POA: Insufficient documentation

## 2018-11-28 DIAGNOSIS — E1165 Type 2 diabetes mellitus with hyperglycemia: Secondary | ICD-10-CM | POA: Insufficient documentation

## 2018-11-28 DIAGNOSIS — E118 Type 2 diabetes mellitus with unspecified complications: Secondary | ICD-10-CM | POA: Insufficient documentation

## 2018-11-28 DIAGNOSIS — IMO0002 Reserved for concepts with insufficient information to code with codable children: Secondary | ICD-10-CM

## 2018-11-28 NOTE — Progress Notes (Signed)
  Medical Nutrition Therapy:  Appt start time: 1600 end time:  1700.   Assessment:  Primary concerns today:  DM Type 2, LIves with husband. H/o Hyperlipidemia, HTN and Obesity. FBS  160-180's. Eats 2-3 meals per day. Admits to snacking.  Glipizide 5 mg BID, Metformin 500 mg BID. A1C 14.5%. Had an infection and was in hospital. Willing to make changes with diet and exercise CMP Latest Ref Rng & Units 10/17/2018 04/22/2018 09/19/2017  Glucose 65 - 99 mg/dL 385(H) - 123(H)  BUN 8 - 27 mg/dL 14 - 17  Creatinine 0.57 - 1.00 mg/dL 0.86 - 0.87  Sodium 134 - 144 mmol/L 137 - 138  Potassium 3.5 - 5.2 mmol/L 4.8 - 4.7  Chloride 96 - 106 mmol/L 96 - 103  CO2 20 - 29 mmol/L 23 - 25  Calcium 8.7 - 10.3 mg/dL 9.9 - 9.6  Total Protein 6.0 - 8.5 g/dL 7.3 6.8 7.2  Total Bilirubin 0.0 - 1.2 mg/dL 0.4 0.3 0.5  Alkaline Phos 39 - 117 IU/L 89 61 63  AST 0 - 40 IU/L 31 23 26   ALT 0 - 32 IU/L 30 17 19    Lipid Panel     Component Value Date/Time   CHOL 215 (H) 10/17/2018 1029   TRIG 204 (H) 10/17/2018 1029   HDL 52 10/17/2018 1029   CHOLHDL 4.1 10/17/2018 1029   CHOLHDL 4.0 12/30/2013 0940   VLDL 35 12/30/2013 0940   LDLCALC 122 (H) 10/17/2018 1029   LABVLDL 41 (H) 10/17/2018 1029   Lab Results  Component Value Date   HGBA1C 14.5 (H) 10/17/2018       Preferred Learning Style:     No preference indicated   Learning Readiness:     Ready  Change in progress   MEDICATIONS:   DIETARY INTAKE:   Eats 2-3 meals per day.   Usual physical activity: walks  Estimated energy needs: 1200  calories 135 g carbohydrates 90  g protein 33 g fat  Progress Towards Goal(s):  Some progress.   Nutritional Diagnosis:  NB-1.1 Food and nutrition-related knowledge deficit As related to Diabetes Type 2.  As evidenced by A1C 14%.    Intervention: Nutrition and Diabetes education provided on My Plate, CHO counting, meal planning, portion sizes, timing of meals, avoiding snacks between meals unless  having a low blood sugar, target ranges for A1C and blood sugars, signs/symptoms and treatment of hyper/hypoglycemia, monitoring blood sugars, taking medications as prescribed, benefits of exercising 30 minutes per day and prevention of complications of DM.  Goals Follow My Plate Eat three balanced meals Increase fresh fruits and vegetables. Don't skip meals Avoid snacks Don't eat past 7 pm Walk 30 minutes as tolerated.  Teaching Method Utilized:  Visual Auditory Hands on  Handouts given during visit include:  The Plate Method  Meal Plan  Diabetes Instructions.   Barriers to learning/adherence to lifestyle change:   Demonstrated degree of understanding via:  Teach Back   Monitoring/Evaluation:  Dietary intake, exercise, , and body weight in 1 month(s).

## 2018-11-28 NOTE — Telephone Encounter (Signed)
Spoke with the pt and notified of recs per MW  She verbalized understanding 

## 2018-12-03 ENCOUNTER — Ambulatory Visit (INDEPENDENT_AMBULATORY_CARE_PROVIDER_SITE_OTHER): Payer: Medicare HMO | Admitting: Family Medicine

## 2018-12-03 ENCOUNTER — Encounter: Payer: Self-pay | Admitting: Family Medicine

## 2018-12-03 ENCOUNTER — Other Ambulatory Visit: Payer: Self-pay

## 2018-12-03 VITALS — BP 122/78 | Temp 97.8°F | Ht 63.0 in | Wt 237.4 lb

## 2018-12-03 DIAGNOSIS — E119 Type 2 diabetes mellitus without complications: Secondary | ICD-10-CM

## 2018-12-03 DIAGNOSIS — Z23 Encounter for immunization: Secondary | ICD-10-CM

## 2018-12-03 MED ORDER — GLIPIZIDE 5 MG PO TABS: 7.5000 mg | ORAL_TABLET | Freq: Two times a day (BID) | ORAL | 1 refills | Status: DC

## 2018-12-03 NOTE — Progress Notes (Signed)
   Subjective:    Patient ID: [name], female    DOB: 09-23-1955, 63 y.o.   MRN: [id number]  HPI  Patient arrives for a follow up on recent labs. Patient had recent labs about a month ao and had elevated HgbA1c and elevated glucose numbers. Patient was started on Metformin and her numbers are coming down. Glucose numbers running 155-179 in am and 128-228 in evening   Patient claims compliance with diabetes medication. No obvious side effects. Reports no substantial low sugar spells. Most numbers are generally in good range when checked fasting. Generally does not miss a dose of medication. Watching diabetic diet closely     Also doing betr with the thirst and ec Review of Systems No headache, no major weight loss or weight gain, no chest pain no back pain abdominal pain no change in bowel habits complete ROS otherwise negative     Objective:   Physical Exam  Alert vitals stable, NAD. Blood pressure good on repeat. HEENT normal. Lungs clear. Heart regular rate and rhythm.       Assessment & Plan:  Impression type 2 diabetes.  Control improving but not ideal discussed.  Will maintain metformin same dose.  Will increase Glucotrol to 1/2 tablets twice daily.  Hypoglycemia avoidance and recognition discussed.  Follow-up in 3 months/flu shot today also

## 2018-12-12 ENCOUNTER — Other Ambulatory Visit: Payer: Self-pay | Admitting: Family Medicine

## 2018-12-16 DIAGNOSIS — R69 Illness, unspecified: Secondary | ICD-10-CM | POA: Diagnosis not present

## 2018-12-17 ENCOUNTER — Other Ambulatory Visit: Payer: Self-pay | Admitting: Family Medicine

## 2018-12-18 DIAGNOSIS — H353131 Nonexudative age-related macular degeneration, bilateral, early dry stage: Secondary | ICD-10-CM | POA: Diagnosis not present

## 2018-12-23 ENCOUNTER — Other Ambulatory Visit: Payer: Self-pay | Admitting: *Deleted

## 2018-12-23 ENCOUNTER — Telehealth: Payer: Self-pay | Admitting: Family Medicine

## 2018-12-23 MED ORDER — ONETOUCH ULTRA VI STRP: ORAL_STRIP | 1 refills | Status: DC

## 2018-12-23 NOTE — Telephone Encounter (Signed)
Discussed with pt. Pt verbalized understanding. Refills sent to pharm.

## 2018-12-23 NOTE — Telephone Encounter (Signed)
[name] has been in the low 100's. This morning 112. Sometimes it has been 130-160.  Called pt and told her most insurances do not cover twice a day testing if not on insulin and she is not. She states she prefers to test once daily because she does not want to pay out of pocket for the strips. She only asked because the dietician told her to test at night. Should pt test twice daily. She needs a refill on strips because she has been doing twice a day so she will run out early and has only 3 strips left.

## 2018-12-23 NOTE — Telephone Encounter (Signed)
May ref ck once per day, modtly fasting occss eve tests ok but in my view the fasting most important

## 2018-12-23 NOTE — Telephone Encounter (Signed)
Pt called to request a new Rx for her diabetic test strips, states that the dietician recommended BID testing & pt's current Rx is for once a day testing  Please send order for BID testing & a new Rx for her test strips & call pt when done     [facility]

## 2018-12-25 ENCOUNTER — Encounter: Payer: Self-pay | Admitting: Nutrition

## 2019-01-04 DIAGNOSIS — R69 Illness, unspecified: Secondary | ICD-10-CM | POA: Diagnosis not present

## 2019-01-08 ENCOUNTER — Ambulatory Visit: Payer: Medicare HMO | Admitting: Nutrition

## 2019-01-10 ENCOUNTER — Telehealth: Payer: Self-pay | Admitting: Family Medicine

## 2019-01-10 NOTE — Telephone Encounter (Signed)
Pt returned call and verbalized understanding  

## 2019-01-10 NOTE — Telephone Encounter (Signed)
Patient said there is a recall on Metformin and needs to know if she is to continue to take it?

## 2019-01-10 NOTE — Telephone Encounter (Signed)
[facility]; also sent MyChart message

## 2019-01-10 NOTE — Telephone Encounter (Signed)
The recall is on the long acting extended release form, not the regular

## 2019-01-14 ENCOUNTER — Other Ambulatory Visit: Payer: Self-pay | Admitting: Adult Health

## 2019-01-15 ENCOUNTER — Telehealth: Payer: Self-pay | Admitting: Orthopedic Surgery

## 2019-01-15 ENCOUNTER — Ambulatory Visit (INDEPENDENT_AMBULATORY_CARE_PROVIDER_SITE_OTHER): Payer: Medicare HMO | Admitting: Family Medicine

## 2019-01-15 ENCOUNTER — Other Ambulatory Visit: Payer: Self-pay

## 2019-01-15 VITALS — BP 130/74 | Temp 97.3°F | Ht 63.0 in | Wt 240.0 lb

## 2019-01-15 DIAGNOSIS — E119 Type 2 diabetes mellitus without complications: Secondary | ICD-10-CM | POA: Diagnosis not present

## 2019-01-15 LAB — POCT GLYCOSYLATED HEMOGLOBIN (HGB A1C): Hemoglobin A1C: 7.2 % — AB (ref 4.0–5.6)

## 2019-01-15 MED ORDER — GLIPIZIDE 5 MG PO TABS: ORAL_TABLET | ORAL | 1 refills | Status: DC

## 2019-01-15 NOTE — Telephone Encounter (Signed)
Patient calling to say that she is ready for right shoulder arthroscopy. She was originally scheduled at [facility] 11-12-18, but procedure was cancelled due to A1c at 14.5.  Today she was seen by Dr. Baltazar Apo and her A1c is 7.2.  (Lab results available in Epic). Patient would like to reserve a surgery date for 02-11-19.  Will patient need to be seen prior to surgery?  Last seen on 10-09-18.      Diagnosis:  Right shoulder massive rotator cuff tear  Procedure:  Right shoulder arthroscopy, biceps tenodesis, mini open rotator cuff repair VS lower trapezius tendon transfer

## 2019-01-15 NOTE — Addendum Note (Signed)
Addended by: Carmelina Noun on: 01/15/2019 11:30 AM   Modules accepted: Orders

## 2019-01-15 NOTE — Progress Notes (Signed)
   Subjective:    Patient ID: [name], female    DOB: [birth year], 63 y.o.   MRN: [id number]  Diabetes She presents for her follow-up diabetic visit. She has type 2 diabetes mellitus. Current diabetic treatments: metformin and glipizide. She is compliant with treatment all of the time. She is following a generally healthy diet. Exercise: walking more and has lost a little weight. Home blood sugar record trend: low 100's in the morning. drops too low about once a week or every 2 weeks.   Results for orders placed or performed in visit on 01/15/19  POCT glycosylated hemoglobin (Hb A1C)  Result Value Ref Range   Hemoglobin A1C 7.2 (A) 4.0 - 5.6 %   HbA1c POC (<> result, manual entry)     HbA1c, POC (prediabetic range)     HbA1c, POC (controlled diabetic range)     Patient does admit she often waits till 2 or 3 in the afternoon after lunch she is dizzy.  Results for orders placed or performed in visit on 01/15/19  POCT glycosylated hemoglobin (Hb A1C)  Result Value Ref Range   Hemoglobin A1C 7.2 (A) 4.0 - 5.6 %   HbA1c POC (<> result, manual entry)     HbA1c, POC (prediabetic range)     HbA1c, POC (controlled diabetic range)      A1c was actually 14 earlier this summer and now has come down nicely.  Patient walks very compliant with medication  Review of Systems No headache, no major weight loss or weight gain, no chest pain no back pain abdominal pain no change in bowel habits complete ROS otherwise negative     Objective:   Physical Exam  Alert vitals stable, NAD. Blood pressure good on repeat. HEENT normal. Lungs clear. Heart regular rate and rhythm. Feet pulses good sensory exam intact        Assessment & Plan:  Impression hypoglycemic episode.  Decrease morning tablets by one half take 1 in the morning and one half in the Glucotrol.  Timing of meals emphasized and patient educated at length in this regard as it relates to hypoglycemia follow-up regular appointment

## 2019-01-16 NOTE — Telephone Encounter (Signed)
Please advise 

## 2019-01-17 ENCOUNTER — Telehealth: Payer: Self-pay | Admitting: Family Medicine

## 2019-01-17 ENCOUNTER — Other Ambulatory Visit: Payer: Self-pay | Admitting: *Deleted

## 2019-01-17 MED ORDER — METFORMIN HCL 500 MG PO TABS: ORAL_TABLET | ORAL | 1 refills | Status: DC

## 2019-01-17 NOTE — Telephone Encounter (Signed)
Pt had check up this week. Ok to fill for 6 months per dr Richardson Landry. Refills sent to pharm. Pt notified.

## 2019-01-17 NOTE — Telephone Encounter (Signed)
Patient was here Wednesday and was suppose to get rx for metformin.  She got the other 2 prescriptions already.  She is completely out of metformin and needs it today.  She wants this rx sent to [facility] [place] [facility]

## 2019-01-19 ENCOUNTER — Other Ambulatory Visit: Payer: Self-pay | Admitting: Internal Medicine

## 2019-02-05 NOTE — Pre-Procedure Instructions (Signed)
[id number] - [place], [place] [address]  [id number] Phone: [phone] Fax: [phone]      Your procedure is scheduled on 02-11-19  Report to [facility] Main Entrance "A" at 0530 A.M., and check in at the Admitting office.  Call this number if you have problems the morning of surgery:  [phone]  Call [phone] if you have any questions prior to your surgery date Monday-Friday 8am-4pm    Remember:  Do not  drink after midnight the night before your surgery  You may drink clear liquids until 0430the morning of your surgery.   Clear liquids allowed are: Water, Non-Citrus Juices (without pulp), Carbonated Beverages, Clear Tea, Black Coffee Only, and Gatorade  Please complete your PRE-SURGERY ENSURE that was provided to you by 0430AM the morning of surgery.  Please, if able, drink it in one setting. DO NOT SIP.   Take these medicines the morning of surgery with A SIP OF WATER: thyroid (ARMOUR) simvastatin (ZOCOR pantoprazole (PROTONIX) escitalopram (LEXAPRO) acetaminophen (TYLENOL) as needed famotidine (PEPCID) as needed  7 days prior to surgery STOP taking any Aspirin (unless otherwise instructed by your surgeon), Aleve, Naproxen, Ibuprofen, Motrin, Advil, Goody's, BC's, all herbal medications, fish oil, and all vitamins.   WHAT DO I DO ABOUT MY DIABETES MEDICATION?   Marland Kitchen Do not take oral diabetes medicines (pills)including Metformin and Glipizide the morning of surgery.The day before surgery, do not take evening dose of Glipizide.  How to Manage Your Diabetes Before and After Surgery  Why is it important to control my blood sugar before and after surgery? . Improving blood sugar levels before and after surgery helps healing and can limit problems. . A way of improving blood sugar control is eating a healthy diet by: o  Eating less sugar and carbohydrates o  Increasing activity/exercise o  Talking with  your doctor about reaching your blood sugar goals . High blood sugars (greater than 180 mg/dL) can raise your risk of infections and slow your recovery, so you will need to focus on controlling your diabetes during the weeks before surgery. . Make sure that the doctor who takes care of your diabetes knows about your planned surgery including the date and location.  How do I manage my blood sugar before surgery? . Check your blood sugar at least 4 times a day, starting 2 days before surgery, to make sure that the level is not too high or low. o Check your blood sugar the morning of your surgery when you wake up and every 2 hours until you get to the Short Stay unit. . If your blood sugar is less than 70 mg/dL, you will need to treat for low blood sugar: o Do not take insulin. o Treat a low blood sugar (less than 70 mg/dL) with  cup of clear juice (cranberry or apple), 4 glucose tablets, OR glucose gel. o Recheck blood sugar in 15 minutes after treatment (to make sure it is greater than 70 mg/dL). If your blood sugar is not greater than 70 mg/dL on recheck, call [phone] for further instructions. . Report your blood sugar to the short stay nurse when you get to Short Stay.  . If you are admitted to the hospital after surgery: o Your blood sugar will be checked by the staff and you will probably be given insulin after surgery (instead of oral diabetes medicines) to make sure you have good blood sugar levels. o The goal  for blood sugar control after surgery is 80-180 mg/dL.   The Morning of Surgery  Do not wear jewelry, make-up or nail polish.  Do not wear lotions, powders, or perfumes/colognes, or deodorant  Do not shave 48 hours prior to surgery.  Do not bring valuables to the hospital.  [facility] is not responsible for any belongings or valuables.  If you are a smoker, DO NOT Smoke 24 hours prior to surgery IF you wear a CPAP at night please bring your mask, tubing, and machine the  morning of surgery   Remember that you must have someone to transport you home after your surgery, and remain with you for 24 hours if you are discharged the same day.   Contacts, glasses, hearing aids, dentures or bridgework may not be worn into surgery.    Leave your suitcase in the car.  After surgery it may be brought to your room.  For patients admitted to the hospital, discharge time will be determined by your treatment team.  Patients discharged the day of surgery will not be allowed to drive home.    Special instructions:   [place]- Preparing For Surgery  Before surgery, you can play an important role. Because skin is not sterile, your skin needs to be as free of germs as possible. You can reduce the number of germs on your skin by washing with CHG (chlorahexidine gluconate) Soap before surgery.  CHG is an antiseptic cleaner which kills germs and bonds with the skin to continue killing germs even after washing.    Oral Hygiene is also important to reduce your risk of infection.  Remember - BRUSH YOUR TEETH THE MORNING OF SURGERY WITH YOUR REGULAR TOOTHPASTE  Please do not use if you have an allergy to CHG or antibacterial soaps. If your skin becomes reddened/irritated stop using the CHG.  Do not shave (including legs and underarms) for at least 48 hours prior to first CHG shower. It is OK to shave your face.  Please follow these instructions carefully.   1. Shower the NIGHT BEFORE SURGERY and the MORNING OF SURGERY with CHG Soap.   2. If you chose to wash your hair, wash your hair first as usual with your normal shampoo.  3. After you shampoo, rinse your hair and body thoroughly to remove the shampoo.  4. Use CHG as you would any other liquid soap. You can apply CHG directly to the skin and wash gently with a scrungie or a clean washcloth.   5. Apply the CHG Soap to your body ONLY FROM THE NECK DOWN.  Do not use on open wounds or open sores. Avoid contact with your  eyes, ears, mouth and genitals (private parts). Wash Face and genitals (private parts)  with your normal soap.   6. Wash thoroughly, paying special attention to the area where your surgery will be performed.  7. Thoroughly rinse your body with warm water from the neck down.  8. DO NOT shower/wash with your normal soap after using and rinsing off the CHG Soap.  9. Pat yourself dry with a CLEAN TOWEL.  10. Wear CLEAN PAJAMAS to bed the night before surgery, wear comfortable clothes the morning of surgery  11. Place CLEAN SHEETS on your bed the night of your first shower and DO NOT SLEEP WITH PETS.    Day of Surgery:  Do not apply any deodorants/lotions. Please shower the morning of surgery with the CHG soap  Please wear clean clothes to the hospital/surgery  center.   Remember to brush your teeth WITH YOUR REGULAR TOOTHPASTE.   Please read over the  fact sheets that you were given.

## 2019-02-06 ENCOUNTER — Other Ambulatory Visit: Payer: Self-pay

## 2019-02-06 ENCOUNTER — Encounter (HOSPITAL_COMMUNITY)
Admission: RE | Admit: 2019-02-06 | Discharge: 2019-02-06 | Disposition: A | Discharge status: Home / Self Care | Payer: Medicare HMO | Source: Ambulatory Visit | Attending: Orthopedic Surgery | Admitting: Orthopedic Surgery

## 2019-02-06 ENCOUNTER — Encounter (HOSPITAL_COMMUNITY): Payer: Self-pay

## 2019-02-06 DIAGNOSIS — Z79899 Other long term (current) drug therapy: Secondary | ICD-10-CM | POA: Diagnosis not present

## 2019-02-06 DIAGNOSIS — Z7984 Long term (current) use of oral hypoglycemic drugs: Secondary | ICD-10-CM | POA: Insufficient documentation

## 2019-02-06 DIAGNOSIS — E039 Hypothyroidism, unspecified: Secondary | ICD-10-CM | POA: Insufficient documentation

## 2019-02-06 DIAGNOSIS — E119 Type 2 diabetes mellitus without complications: Secondary | ICD-10-CM | POA: Insufficient documentation

## 2019-02-06 DIAGNOSIS — M75101 Unspecified rotator cuff tear or rupture of right shoulder, not specified as traumatic: Secondary | ICD-10-CM | POA: Diagnosis not present

## 2019-02-06 DIAGNOSIS — Z01812 Encounter for preprocedural laboratory examination: Secondary | ICD-10-CM | POA: Insufficient documentation

## 2019-02-06 DIAGNOSIS — S43004A Unspecified dislocation of right shoulder joint, initial encounter: Secondary | ICD-10-CM | POA: Diagnosis not present

## 2019-02-06 DIAGNOSIS — Z87891 Personal history of nicotine dependence: Secondary | ICD-10-CM | POA: Diagnosis not present

## 2019-02-06 DIAGNOSIS — I10 Essential (primary) hypertension: Secondary | ICD-10-CM | POA: Insufficient documentation

## 2019-02-06 HISTORY — DX: Gastro-esophageal reflux disease without esophagitis: K21.9

## 2019-02-06 HISTORY — DX: Depression, unspecified: F32.A

## 2019-02-06 LAB — CBC
HCT: 39.1 % (ref 36.0–46.0)
Hemoglobin: 12.9 g/dL (ref 12.0–15.0)
MCH: 30.2 pg (ref 26.0–34.0)
MCHC: 33 g/dL (ref 30.0–36.0)
MCV: 91.6 fL (ref 80.0–100.0)
Platelets: 301 10*3/uL (ref 150–400)
RBC: 4.27 MIL/uL (ref 3.87–5.11)
RDW: 13.2 % (ref 11.5–15.5)
WBC: 8.5 10*3/uL (ref 4.0–10.5)
nRBC: 0 % (ref 0.0–0.2)

## 2019-02-06 LAB — BASIC METABOLIC PANEL
Anion gap: 10 (ref 5–15)
BUN: 23 mg/dL (ref 8–23)
CO2: 24 mmol/L (ref 22–32)
Calcium: 9.5 mg/dL (ref 8.9–10.3)
Chloride: 103 mmol/L (ref 98–111)
Creatinine, Ser: 0.8 mg/dL (ref 0.44–1.00)
GFR calc Af Amer: >60 mL/min (ref >60)
GFR calc non Af Amer: >60 mL/min (ref >60)
Glucose, Bld: 127 mg/dL — ABNORMAL HIGH (ref 70–99)
Potassium: 4.2 mmol/L (ref 3.5–5.1)
Sodium: 137 mmol/L (ref 135–145)

## 2019-02-06 LAB — URINALYSIS, ROUTINE W REFLEX MICROSCOPIC
Bilirubin Urine: NEGATIVE
Glucose, UA: NEGATIVE mg/dL
Hgb urine dipstick: NEGATIVE
Ketones, ur: NEGATIVE mg/dL
Leukocytes,Ua: NEGATIVE
Nitrite: NEGATIVE
Protein, ur: NEGATIVE mg/dL
Specific Gravity, Urine: 1.011 (ref 1.005–1.030)
pH: 5 (ref 5.0–8.0)

## 2019-02-06 LAB — GLUCOSE, CAPILLARY: Glucose-Capillary: 133 mg/dL — ABNORMAL HIGH (ref 70–99)

## 2019-02-06 NOTE — Progress Notes (Signed)
PCP - [place] Cardiologist - denies  PPM/ICD - n/a Device Orders -  Rep Notified -   Chest x-ray - n/a EKG - 10/2018 Stress Test - denies ECHO - denies Cardiac Cath - denies  Sleep Study - denies CPAP -   Fasting Blood Sugar - 133 Checks Blood Sugar _0nce____ times a day, pt. Reports that because she was low on strips the MD told her to check BS once per day  Blood Thinner Instructions:n/a Aspirin Instructions:n/a  ERAS Protcol -yes, fully explained to pt, also given typed instructions  PRE-SURGERY Ensure or G2- G2 given to pt.   COVID TEST- scheduled for 11/13  Anesthesia review: n/a  In 10/2018 pt. Surg. Was cancelled to be managed for TYpe II DM. Last A1c 10/20 - 7.2, is now on Metformin & Glypizide CBG- wnl today  Patient denies shortness of breath, fever, cough and chest pain at PAT appointment   All instructions explained to the patient, with a verbal understanding of the material. Patient agrees to go over the instructions while at home for a better understanding. Patient also instructed to self quarantine after being tested for COVID-19. The opportunity to ask questions was provided.

## 2019-02-07 ENCOUNTER — Other Ambulatory Visit (HOSPITAL_COMMUNITY): Payer: Medicare HMO

## 2019-02-07 ENCOUNTER — Other Ambulatory Visit (HOSPITAL_COMMUNITY)
Admission: RE | Admit: 2019-02-07 | Discharge: 2019-02-07 | Disposition: A | Discharge status: Home / Self Care | Payer: Medicare HMO | Source: Ambulatory Visit | Attending: Orthopedic Surgery | Admitting: Orthopedic Surgery

## 2019-02-07 LAB — URINE CULTURE: Culture: NO GROWTH

## 2019-02-10 ENCOUNTER — Ambulatory Visit: Payer: Medicare HMO | Admitting: Family Medicine

## 2019-02-10 ENCOUNTER — Telehealth: Payer: Self-pay

## 2019-02-10 DIAGNOSIS — E039 Hypothyroidism, unspecified: Secondary | ICD-10-CM | POA: Diagnosis not present

## 2019-02-10 DIAGNOSIS — R69 Illness, unspecified: Secondary | ICD-10-CM | POA: Diagnosis not present

## 2019-02-10 DIAGNOSIS — Z886 Allergy status to analgesic agent status: Secondary | ICD-10-CM | POA: Diagnosis not present

## 2019-02-10 DIAGNOSIS — M19042 Primary osteoarthritis, left hand: Secondary | ICD-10-CM | POA: Diagnosis not present

## 2019-02-10 DIAGNOSIS — Z79899 Other long term (current) drug therapy: Secondary | ICD-10-CM | POA: Diagnosis not present

## 2019-02-10 DIAGNOSIS — I10 Essential (primary) hypertension: Secondary | ICD-10-CM | POA: Diagnosis not present

## 2019-02-10 DIAGNOSIS — Z96651 Presence of right artificial knee joint: Secondary | ICD-10-CM | POA: Diagnosis not present

## 2019-02-10 DIAGNOSIS — M62511 Muscle wasting and atrophy, not elsewhere classified, right shoulder: Secondary | ICD-10-CM | POA: Diagnosis not present

## 2019-02-10 DIAGNOSIS — Z87891 Personal history of nicotine dependence: Secondary | ICD-10-CM | POA: Diagnosis not present

## 2019-02-10 DIAGNOSIS — X58XXXA Exposure to other specified factors, initial encounter: Secondary | ICD-10-CM | POA: Diagnosis not present

## 2019-02-10 DIAGNOSIS — K219 Gastro-esophageal reflux disease without esophagitis: Secondary | ICD-10-CM | POA: Diagnosis not present

## 2019-02-10 DIAGNOSIS — Z20828 Contact with and (suspected) exposure to other viral communicable diseases: Secondary | ICD-10-CM | POA: Diagnosis not present

## 2019-02-10 DIAGNOSIS — M7521 Bicipital tendinitis, right shoulder: Secondary | ICD-10-CM | POA: Diagnosis not present

## 2019-02-10 DIAGNOSIS — E119 Type 2 diabetes mellitus without complications: Secondary | ICD-10-CM | POA: Diagnosis not present

## 2019-02-10 DIAGNOSIS — Z7984 Long term (current) use of oral hypoglycemic drugs: Secondary | ICD-10-CM | POA: Diagnosis not present

## 2019-02-10 DIAGNOSIS — F329 Major depressive disorder, single episode, unspecified: Secondary | ICD-10-CM | POA: Diagnosis not present

## 2019-02-10 DIAGNOSIS — E7800 Pure hypercholesterolemia, unspecified: Secondary | ICD-10-CM | POA: Diagnosis not present

## 2019-02-10 DIAGNOSIS — Z6841 Body Mass Index (BMI) 40.0 and over, adult: Secondary | ICD-10-CM | POA: Diagnosis not present

## 2019-02-10 DIAGNOSIS — S46011A Strain of muscle(s) and tendon(s) of the rotator cuff of right shoulder, initial encounter: Secondary | ICD-10-CM | POA: Diagnosis present

## 2019-02-10 DIAGNOSIS — M19041 Primary osteoarthritis, right hand: Secondary | ICD-10-CM | POA: Diagnosis not present

## 2019-02-10 LAB — SARS CORONAVIRUS 2 (TAT 6-24 HRS): SARS Coronavirus 2: NEGATIVE

## 2019-02-10 NOTE — Telephone Encounter (Signed)
thx

## 2019-02-10 NOTE — Telephone Encounter (Signed)
I spoke with patient and confirmed that she did have her brace from [facility]. She is aware that she needs to bring it with her tomorrow for surgery.

## 2019-02-10 NOTE — Anesthesia Preprocedure Evaluation (Addendum)
Anesthesia Evaluation  Patient identified by MRN, date of birth, ID band Patient awake    Reviewed: Allergy & Precautions, NPO status , Patient's Chart, lab work & pertinent test results  History of Anesthesia Complications (+) PONVNegative for: history of anesthetic complications  Airway Mallampati: I  TM Distance: >3 FB Neck ROM: Full    Dental  (+) Teeth Intact, Dental Advisory Given   Pulmonary neg pulmonary ROS, former smoker,    Pulmonary exam normal        Cardiovascular hypertension, Pt. on medications Normal cardiovascular exam     Neuro/Psych PSYCHIATRIC DISORDERS Depression negative neurological ROS     GI/Hepatic Neg liver ROS, GERD  Medicated,  Endo/Other  diabetes, Type 2, Oral Hypoglycemic AgentsHypothyroidism Morbid obesity  Renal/GU negative Renal ROS  negative genitourinary   Musculoskeletal  (+) Arthritis ,   Abdominal   Peds  Hematology negative hematology ROS (+)   Anesthesia Other Findings   Reproductive/Obstetrics                           Anesthesia Physical Anesthesia Plan  ASA: III  Anesthesia Plan: General   Post-op Pain Management: GA combined w/ Regional for post-op pain   Induction: Intravenous  PONV Risk Score and Plan: 4 or greater and Ondansetron, Dexamethasone, Treatment may vary due to age or medical condition and Midazolam  Airway Management Planned: Oral ETT  Additional Equipment: None  Intra-op Plan:   Post-operative Plan: Extubation in OR  Informed Consent: I have reviewed the patients History and Physical, chart, labs and discussed the procedure including the risks, benefits and alternatives for the proposed anesthesia with the patient or authorized representative who has indicated his/her understanding and acceptance.     Dental advisory given  Plan Discussed with:   Anesthesia Plan Comments:        Anesthesia Quick  Evaluation

## 2019-02-11 ENCOUNTER — Ambulatory Visit (HOSPITAL_COMMUNITY): Payer: Medicare HMO | Admitting: Anesthesiology

## 2019-02-11 ENCOUNTER — Encounter (HOSPITAL_COMMUNITY)
Admission: RE | Disposition: A | Discharge status: Home / Self Care | Payer: Self-pay | Source: Home / Self Care | Attending: Orthopedic Surgery

## 2019-02-11 ENCOUNTER — Ambulatory Visit (HOSPITAL_COMMUNITY)
Admission: RE | Admit: 2019-02-11 | Discharge: 2019-02-11 | Disposition: A | Discharge status: Home / Self Care | Payer: Medicare HMO | Attending: Orthopedic Surgery | Admitting: Orthopedic Surgery

## 2019-02-11 ENCOUNTER — Encounter (HOSPITAL_COMMUNITY): Payer: Self-pay | Admitting: *Deleted

## 2019-02-11 ENCOUNTER — Other Ambulatory Visit: Payer: Self-pay

## 2019-02-11 DIAGNOSIS — Z96651 Presence of right artificial knee joint: Secondary | ICD-10-CM | POA: Insufficient documentation

## 2019-02-11 DIAGNOSIS — G8918 Other acute postprocedural pain: Secondary | ICD-10-CM | POA: Diagnosis not present

## 2019-02-11 DIAGNOSIS — Z20828 Contact with and (suspected) exposure to other viral communicable diseases: Secondary | ICD-10-CM | POA: Diagnosis not present

## 2019-02-11 DIAGNOSIS — K219 Gastro-esophageal reflux disease without esophagitis: Secondary | ICD-10-CM | POA: Diagnosis not present

## 2019-02-11 DIAGNOSIS — S46011A Strain of muscle(s) and tendon(s) of the rotator cuff of right shoulder, initial encounter: Secondary | ICD-10-CM | POA: Insufficient documentation

## 2019-02-11 DIAGNOSIS — Z87891 Personal history of nicotine dependence: Secondary | ICD-10-CM | POA: Insufficient documentation

## 2019-02-11 DIAGNOSIS — M7521 Bicipital tendinitis, right shoulder: Secondary | ICD-10-CM

## 2019-02-11 DIAGNOSIS — Z7984 Long term (current) use of oral hypoglycemic drugs: Secondary | ICD-10-CM | POA: Insufficient documentation

## 2019-02-11 DIAGNOSIS — M75101 Unspecified rotator cuff tear or rupture of right shoulder, not specified as traumatic: Secondary | ICD-10-CM | POA: Diagnosis not present

## 2019-02-11 DIAGNOSIS — S46011D Strain of muscle(s) and tendon(s) of the rotator cuff of right shoulder, subsequent encounter: Secondary | ICD-10-CM | POA: Diagnosis not present

## 2019-02-11 DIAGNOSIS — M62511 Muscle wasting and atrophy, not elsewhere classified, right shoulder: Secondary | ICD-10-CM | POA: Diagnosis not present

## 2019-02-11 DIAGNOSIS — Z79899 Other long term (current) drug therapy: Secondary | ICD-10-CM | POA: Insufficient documentation

## 2019-02-11 DIAGNOSIS — Z886 Allergy status to analgesic agent status: Secondary | ICD-10-CM | POA: Insufficient documentation

## 2019-02-11 DIAGNOSIS — E7800 Pure hypercholesterolemia, unspecified: Secondary | ICD-10-CM | POA: Diagnosis not present

## 2019-02-11 DIAGNOSIS — F329 Major depressive disorder, single episode, unspecified: Secondary | ICD-10-CM | POA: Insufficient documentation

## 2019-02-11 DIAGNOSIS — M19041 Primary osteoarthritis, right hand: Secondary | ICD-10-CM | POA: Diagnosis not present

## 2019-02-11 DIAGNOSIS — E039 Hypothyroidism, unspecified: Secondary | ICD-10-CM | POA: Insufficient documentation

## 2019-02-11 DIAGNOSIS — Z6841 Body Mass Index (BMI) 40.0 and over, adult: Secondary | ICD-10-CM | POA: Insufficient documentation

## 2019-02-11 DIAGNOSIS — I10 Essential (primary) hypertension: Secondary | ICD-10-CM | POA: Insufficient documentation

## 2019-02-11 DIAGNOSIS — M19042 Primary osteoarthritis, left hand: Secondary | ICD-10-CM | POA: Insufficient documentation

## 2019-02-11 DIAGNOSIS — E119 Type 2 diabetes mellitus without complications: Secondary | ICD-10-CM | POA: Diagnosis not present

## 2019-02-11 DIAGNOSIS — X58XXXA Exposure to other specified factors, initial encounter: Secondary | ICD-10-CM | POA: Insufficient documentation

## 2019-02-11 DIAGNOSIS — R69 Illness, unspecified: Secondary | ICD-10-CM | POA: Diagnosis not present

## 2019-02-11 HISTORY — PX: SHOULDER ARTHROSCOPY WITH SUBACROMIAL DECOMPRESSION, ROTATOR CUFF REPAIR AND BICEP TENDON REPAIR: SHX5687

## 2019-02-11 LAB — GLUCOSE, CAPILLARY
Glucose-Capillary: 120 mg/dL — ABNORMAL HIGH (ref 70–99)
Glucose-Capillary: 197 mg/dL — ABNORMAL HIGH (ref 70–99)

## 2019-02-11 SURGERY — SHOULDER ARTHROSCOPY WITH SUBACROMIAL DECOMPRESSION, ROTATOR CUFF REPAIR AND BICEP TENDON REPAIR
Anesthesia: General | Laterality: Right

## 2019-02-11 MED ORDER — BUPIVACAINE HCL (PF) 0.5 % IJ SOLN
INTRAMUSCULAR | Status: DC | PRN
  Administered 2019-02-11: 15 mL via PERINEURAL

## 2019-02-11 MED ORDER — CEFAZOLIN SODIUM-DEXTROSE 2-4 GM/100ML-% IV SOLN
INTRAVENOUS | Status: AC
  Filled 2019-02-11: qty 100

## 2019-02-11 MED ORDER — ROCURONIUM BROMIDE 10 MG/ML (PF) SYRINGE
PREFILLED_SYRINGE | INTRAVENOUS | Status: DC | PRN
  Administered 2019-02-11: 100 mg via INTRAVENOUS

## 2019-02-11 MED ORDER — DEXAMETHASONE SODIUM PHOSPHATE 10 MG/ML IJ SOLN
INTRAMUSCULAR | Status: AC
  Filled 2019-02-11: qty 1

## 2019-02-11 MED ORDER — TRANEXAMIC ACID-NACL 1000-0.7 MG/100ML-% IV SOLN
INTRAVENOUS | Status: AC
  Filled 2019-02-11: qty 100

## 2019-02-11 MED ORDER — EPHEDRINE SULFATE-NACL 50-0.9 MG/10ML-% IV SOSY
PREFILLED_SYRINGE | INTRAVENOUS | Status: DC | PRN
  Administered 2019-02-11 (×3): 5 mg via INTRAVENOUS

## 2019-02-11 MED ORDER — CHLORHEXIDINE GLUCONATE 4 % EX LIQD: 60.0000 mL | Freq: Once | CUTANEOUS | Status: DC

## 2019-02-11 MED ORDER — MIDAZOLAM HCL 5 MG/5ML IJ SOLN
INTRAMUSCULAR | Status: DC | PRN
  Administered 2019-02-11: 2 mg via INTRAVENOUS

## 2019-02-11 MED ORDER — BUPIVACAINE LIPOSOME 1.3 % IJ SUSP
INTRAMUSCULAR | Status: DC | PRN
  Administered 2019-02-11: 10 mL

## 2019-02-11 MED ORDER — CEFAZOLIN SODIUM-DEXTROSE 2-4 GM/100ML-% IV SOLN
2.0000 g | INTRAVENOUS | Status: AC
  Administered 2019-02-11: 2 g via INTRAVENOUS

## 2019-02-11 MED ORDER — FENTANYL CITRATE (PF) 100 MCG/2ML IJ SOLN
INTRAMUSCULAR | Status: DC | PRN
  Administered 2019-02-11: 50 ug via INTRAVENOUS
  Administered 2019-02-11: 25 ug via INTRAVENOUS
  Administered 2019-02-11 (×2): 50 ug via INTRAVENOUS

## 2019-02-11 MED ORDER — ROCURONIUM BROMIDE 10 MG/ML (PF) SYRINGE
PREFILLED_SYRINGE | INTRAVENOUS | Status: AC
  Filled 2019-02-11: qty 10

## 2019-02-11 MED ORDER — TRANEXAMIC ACID-NACL 1000-0.7 MG/100ML-% IV SOLN
INTRAVENOUS | Status: DC | PRN
  Administered 2019-02-11: 1000 mg via INTRAVENOUS

## 2019-02-11 MED ORDER — ASPIRIN EC 81 MG PO TBEC: 81.0000 mg | DELAYED_RELEASE_TABLET | Freq: Every day | ORAL | 0 refills | Status: DC

## 2019-02-11 MED ORDER — ONDANSETRON HCL 4 MG/2ML IJ SOLN
INTRAMUSCULAR | Status: AC
  Filled 2019-02-11: qty 2

## 2019-02-11 MED ORDER — OXYCODONE HCL 5 MG PO TABS: 5.0000 mg | ORAL_TABLET | Freq: Once | ORAL | Status: DC | PRN

## 2019-02-11 MED ORDER — OXYCODONE HCL 5 MG/5ML PO SOLN: 5.0000 mg | Freq: Once | ORAL | Status: DC | PRN

## 2019-02-11 MED ORDER — SUGAMMADEX SODIUM 200 MG/2ML IV SOLN
INTRAVENOUS | Status: DC | PRN
  Administered 2019-02-11: 200 mg via INTRAVENOUS

## 2019-02-11 MED ORDER — FENTANYL CITRATE (PF) 250 MCG/5ML IJ SOLN
INTRAMUSCULAR | Status: AC
  Filled 2019-02-11: qty 5

## 2019-02-11 MED ORDER — PHENYLEPHRINE 40 MCG/ML (10ML) SYRINGE FOR IV PUSH (FOR BLOOD PRESSURE SUPPORT)
PREFILLED_SYRINGE | INTRAVENOUS | Status: DC | PRN
  Administered 2019-02-11 (×2): 120 ug via INTRAVENOUS

## 2019-02-11 MED ORDER — MIDAZOLAM HCL 2 MG/2ML IJ SOLN
INTRAMUSCULAR | Status: AC
  Filled 2019-02-11: qty 2

## 2019-02-11 MED ORDER — LIDOCAINE 2% (20 MG/ML) 5 ML SYRINGE
INTRAMUSCULAR | Status: DC | PRN
  Administered 2019-02-11: 60 mg via INTRAVENOUS

## 2019-02-11 MED ORDER — LACTATED RINGERS IV SOLN
INTRAVENOUS | Status: DC | PRN
  Administered 2019-02-11 (×2): via INTRAVENOUS

## 2019-02-11 MED ORDER — ONDANSETRON HCL 4 MG/2ML IJ SOLN
INTRAMUSCULAR | Status: DC | PRN
  Administered 2019-02-11: 4 mg via INTRAVENOUS

## 2019-02-11 MED ORDER — ONDANSETRON HCL 4 MG/2ML IJ SOLN: 4.0000 mg | Freq: Once | INTRAMUSCULAR | Status: DC | PRN

## 2019-02-11 MED ORDER — PROPOFOL 10 MG/ML IV BOLUS
INTRAVENOUS | Status: AC
  Filled 2019-02-11: qty 20

## 2019-02-11 MED ORDER — METHOCARBAMOL 500 MG PO TABS: 500.0000 mg | ORAL_TABLET | Freq: Three times a day (TID) | ORAL | 0 refills | Status: DC | PRN

## 2019-02-11 MED ORDER — OXYCODONE HCL 5 MG PO TABS: 5.0000 mg | ORAL_TABLET | ORAL | 0 refills | Status: DC | PRN

## 2019-02-11 MED ORDER — PHENYLEPHRINE HCL-NACL 10-0.9 MG/250ML-% IV SOLN
INTRAVENOUS | Status: DC | PRN
  Administered 2019-02-11: 50 ug/min via INTRAVENOUS

## 2019-02-11 MED ORDER — PHENYLEPHRINE 40 MCG/ML (10ML) SYRINGE FOR IV PUSH (FOR BLOOD PRESSURE SUPPORT)
PREFILLED_SYRINGE | INTRAVENOUS | Status: AC
  Filled 2019-02-11: qty 10

## 2019-02-11 MED ORDER — IRRISEPT - 450ML BOTTLE WITH 0.05% CHG IN STERILE WATER, USP 99.95% OPTIME
TOPICAL | Status: DC | PRN
  Administered 2019-02-11: 450 mL

## 2019-02-11 MED ORDER — FENTANYL CITRATE (PF) 100 MCG/2ML IJ SOLN: 25.0000 ug | INTRAMUSCULAR | Status: DC | PRN

## 2019-02-11 MED ORDER — LIDOCAINE 2% (20 MG/ML) 5 ML SYRINGE
INTRAMUSCULAR | Status: AC
  Filled 2019-02-11: qty 5

## 2019-02-11 MED ORDER — PROPOFOL 10 MG/ML IV BOLUS
INTRAVENOUS | Status: DC | PRN
  Administered 2019-02-11: 200 mg via INTRAVENOUS

## 2019-02-11 MED ORDER — DEXAMETHASONE SODIUM PHOSPHATE 4 MG/ML IJ SOLN
INTRAMUSCULAR | Status: DC | PRN
  Administered 2019-02-11: 4 mg via INTRAVENOUS

## 2019-02-11 MED ORDER — SODIUM CHLORIDE 0.9 % IR SOLN
Status: DC | PRN
  Administered 2019-02-11: 3000 mL
  Administered 2019-02-11: 2000 mL

## 2019-02-11 SURGICAL SUPPLY — 81 items
AID PSTN UNV HD RSTRNT DISP (MISCELLANEOUS) ×1
ANCH SUT 2 SWLK 19.1 CLS EYLT (Anchor) ×2 IMPLANT
ANCH SUT PUSHLCK 24X4.5 STRL (Orthopedic Implant) ×2 IMPLANT
ANCH SUT SWLK 19.1X4.75 (Anchor) ×2 IMPLANT
ANCHOR SUT 1.8 FBRTK KNTLS 2SU (Anchor) ×2 IMPLANT
ANCHOR SUT BIO SW 4.75X19.1 (Anchor) ×2 IMPLANT
ANCHOR SWIVELOCK BIO 4.75X19.1 (Anchor) ×2 IMPLANT
BLADE EXCALIBUR 4.0X13 (MISCELLANEOUS) ×2 IMPLANT
BLADE SURG 10 STRL SS (BLADE) ×1 IMPLANT
BLADE SURG 11 STRL SS (BLADE) IMPLANT
BURR OVAL 8 FLU 4.0X13 (MISCELLANEOUS) IMPLANT
COVER SURGICAL LIGHT HANDLE (MISCELLANEOUS) ×2 IMPLANT
COVER WAND RF STERILE (DRAPES) IMPLANT
DRAPE INCISE IOBAN 66X45 STRL (DRAPES) ×4 IMPLANT
DRAPE STERI 35X30 U-POUCH (DRAPES) ×2 IMPLANT
DRAPE SWITCH (DRAPES) ×1 IMPLANT
DRAPE U-SHAPE 47X51 STRL (DRAPES) ×5 IMPLANT
DRSG AQUACEL AG ADV 3.5X 6 (GAUZE/BANDAGES/DRESSINGS) ×1 IMPLANT
DRSG TEGADERM 4X4.75 (GAUZE/BANDAGES/DRESSINGS) ×6 IMPLANT
DURAPREP 26ML APPLICATOR (WOUND CARE) ×2 IMPLANT
DW OUTFLOW CASSETTE/TUBE SET (MISCELLANEOUS) ×2 IMPLANT
ELECT REM PT RETURN 9FT ADLT (ELECTROSURGICAL) ×2
ELECTRODE REM PT RTRN 9FT ADLT (ELECTROSURGICAL) ×1 IMPLANT
GAUZE SPONGE 4X4 12PLY STRL (GAUZE/BANDAGES/DRESSINGS) ×2 IMPLANT
GAUZE SPONGE 4X4 12PLY STRL LF (GAUZE/BANDAGES/DRESSINGS) ×2 IMPLANT
GLOVE BIOGEL PI IND STRL 7.0 (GLOVE) ×1 IMPLANT
GLOVE BIOGEL PI IND STRL 8 (GLOVE) ×1 IMPLANT
GLOVE BIOGEL PI INDICATOR 7.0 (GLOVE) ×1
GLOVE BIOGEL PI INDICATOR 8 (GLOVE) ×1
GLOVE ECLIPSE 7.0 STRL STRAW (GLOVE) ×2 IMPLANT
GLOVE ECLIPSE 8.0 STRL XLNG CF (GLOVE) ×2 IMPLANT
GOWN STRL REUS W/ TWL LRG LVL3 (GOWN DISPOSABLE) ×3 IMPLANT
GOWN STRL REUS W/TWL LRG LVL3 (GOWN DISPOSABLE) ×6
GRAFT ACHILLES CALC BNE BLCK (Bone Implant) IMPLANT
GRAFT ACHILLES TENDON (Bone Implant) ×2 IMPLANT
KIT BASIN OR (CUSTOM PROCEDURE TRAY) ×2 IMPLANT
KIT STABILIZATION SHOULDER (MISCELLANEOUS) ×1 IMPLANT
KIT STR SPEAR 1.8 FBRTK DISP (KITS) ×1 IMPLANT
KIT TURNOVER KIT B (KITS) ×2 IMPLANT
MANIFOLD NEPTUNE II (INSTRUMENTS) ×2 IMPLANT
NDL SCORPION MULTI FIRE (NEEDLE) IMPLANT
NDL SPNL 18GX3.5 QUINCKE PK (NEEDLE) ×1 IMPLANT
NDL SUT 6 .5 CRC .975X.05 MAYO (NEEDLE) IMPLANT
NEEDLE MAYO TAPER (NEEDLE) ×2
NEEDLE SCORPION MULTI FIRE (NEEDLE) IMPLANT
NEEDLE SPNL 18GX3.5 QUINCKE PK (NEEDLE) ×2 IMPLANT
NS IRRIG 1000ML POUR BTL (IV SOLUTION) ×2 IMPLANT
PACK SHOULDER (CUSTOM PROCEDURE TRAY) ×2 IMPLANT
PAD ARMBOARD 7.5X6 YLW CONV (MISCELLANEOUS) ×4 IMPLANT
PORT APPOLLO RF 90DEGREE MULTI (SURGICAL WAND) ×1 IMPLANT
PUSHLOCK PEEK 4.5X24 (Orthopedic Implant) ×2 IMPLANT
RESTRAINT HEAD UNIVERSAL NS (MISCELLANEOUS) ×2 IMPLANT
SLING ARM IMMOBILIZER LRG (SOFTGOODS) IMPLANT
SPONGE LAP 4X18 RFD (DISPOSABLE) ×4 IMPLANT
STRIP CLOSURE SKIN 1/2X4 (GAUZE/BANDAGES/DRESSINGS) ×2 IMPLANT
SUCTION FRAZIER HANDLE 10FR (MISCELLANEOUS) ×1
SUCTION TUBE FRAZIER 10FR DISP (MISCELLANEOUS) ×1 IMPLANT
SUT 2 FIBERLOOP 20 STRT BLUE (SUTURE) ×2
SUT ETHILON 3 0 PS 1 (SUTURE) ×2 IMPLANT
SUT FIBERWIRE #2 38 T-5 BLUE (SUTURE)
SUT MNCRL AB 3-0 PS2 18 (SUTURE) ×2 IMPLANT
SUT VIC AB 0 CT1 27 (SUTURE) ×2
SUT VIC AB 0 CT1 27XBRD ANBCTR (SUTURE) ×1 IMPLANT
SUT VIC AB 1 CT1 27 (SUTURE)
SUT VIC AB 1 CT1 27XBRD ANBCTR (SUTURE) IMPLANT
SUT VIC AB 2-0 CT1 27 (SUTURE) ×2
SUT VIC AB 2-0 CT1 TAPERPNT 27 (SUTURE) ×1 IMPLANT
SUT VICRYL 0 UR6 27IN ABS (SUTURE) ×4 IMPLANT
SUT VICRYL 1 TIES 12X18 (SUTURE) ×2 IMPLANT
SUTURE 2 FIBERLOOP 20 STRT BLU (SUTURE) IMPLANT
SUTURE FIBERWR #2 38 T-5 BLUE (SUTURE) IMPLANT
SUTURE TAPE 1.3 40 TPR END (SUTURE) IMPLANT
SUTURE TAPE 1.3 FIBERLOP 20 ST (SUTURE) IMPLANT
SUTURE TAPE TIGERLINK 1.3MM BL (SUTURE) IMPLANT
SUTURETAPE 1.3 40 TPR END (SUTURE) ×8
SUTURETAPE 1.3 FIBERLOOP 20 ST (SUTURE)
SUTURETAPE TIGERLINK 1.3MM BL (SUTURE) ×2
TOWEL GREEN STERILE (TOWEL DISPOSABLE) ×2 IMPLANT
TOWEL GREEN STERILE FF (TOWEL DISPOSABLE) ×2 IMPLANT
TUBING ARTHROSCOPY IRRIG 16FT (MISCELLANEOUS) ×2 IMPLANT
WATER STERILE IRR 1000ML POUR (IV SOLUTION) ×2 IMPLANT

## 2019-02-11 NOTE — Brief Op Note (Signed)
   02/11/2019  12:08 PM  PATIENT:  Alois Cliche  63 y.o. female  PRE-OPERATIVE DIAGNOSIS:  right shoulder massive rotator cuff tear  POST-OPERATIVE DIAGNOSIS:  right shoulder massive rotator cuff tear  PROCEDURE:  Procedure(s): right shoulde arthroscopy, biceps tenodesis lower trapezius tendon transfer  SURGEON:  Surgeon(s): Meredith Pel, MD  ASSISTANT: Magnant PA  ANESTHESIA:   general  EBL: 75 ml    Total I/O In: 1500 [I.V.:1500] Out: 525 [Urine:425; Blood:100]  BLOOD ADMINISTERED: none  DRAINS: none   LOCAL MEDICATIONS USED:  none  SPECIMEN:  No Specimen  COUNTS:  YES  TOURNIQUET:  * No tourniquets in log *  DICTATION: .Other Dictation: Dictation Number [id number]  PLAN OF CARE: Discharge to home after PACU  PATIENT DISPOSITION:  PACU - hemodynamically stable

## 2019-02-11 NOTE — H&P (Signed)
[name] is an 63 y.o. female.   Chief Complaint: Right shoulder weakness HPI: [name] is a 63 year old female with right shoulder pain and weakness.  She sustained traumatic rotator cuff tear earlier this year.  Blood glucose management has been better controlled and she presents now for elective surgical repair of the rotator cuff tear versus tendon transfer.  Patient does report loss of strength is her primary complaint along with pain as a secondary complaint.  MRI scan does show retracted infraspinatus and supraspinatus rotator cuff tendon tears.  Subscapularis is intact.  Past Medical History:  Diagnosis Date  . Arthritis    "fingers occasionally" (08/26/2014)  . Asthmatic bronchitis    "haven't used an inhaler in years" (08/26/2014)  . Cervical cancer ([place])   . Counseling for estrogen replacement therapy 12/30/2014  . Depression   . Diabetes mellitus without complication ([place])    type- 2  . Elevated cholesterol   . Exertional shortness of breath   . Family history of adverse reaction to anesthesia    "I think my mama gets sick"  . GERD (gastroesophageal reflux disease)   . Hot flashes 07/05/2012  . Hypertension   . Hypothyroidism   . IFG (impaired fasting glucose)   . Mixed stress and urge urinary incontinence   . [place] 12/30/2014  . Obesity   . Panniculus   . PONV (postoperative nausea and vomiting)   . Rectocele 07/05/2012  . Stress incontinence   . Superficial fungus infection of skin     Past Surgical History:  Procedure Laterality Date  . ABDOMINAL HYSTERECTOMY  1986   "partial"  . ANKLE FUSION Left 10/24/2013   Procedure: LEFT ANKLE SUBTALAR AND TALONAVICULAR FUSION;  Surgeon: Newt Minion, MD;  Location: [facility];  Service: Orthopedics;  Laterality: Left;  . ANKLE FUSION Left 08/26/2014   Procedure: Left Foot Take Down Non-union with Revision Talonavicular and Subtalar Fusion;  Surgeon: Newt Minion, MD;  Location: [facility];  Service: Orthopedics;  Laterality: Left;  . BACK  SURGERY    . BILATERAL SALPINGECTOMY  2010   w/LOA  . COLONOSCOPY    . COLONOSCOPY N/A 07/14/2016   Procedure: COLONOSCOPY;  Surgeon: Danie Binder, MD;  [facility];  Service: Endoscopy;  Laterality: N/A;  8:30  . FUSION OF TALONAVICULAR JOINT Left    Take Down Non-union with Revision Talonavicular and Subtalar Fusion /notes 08/26/2014  . HAMMER TOE SURGERY Bilateral 2000-2013   right-left  . JOINT REPLACEMENT    . KNEE ARTHROSCOPY Bilateral 2008-2010    left-right  . LUMBAR DISC SURGERY  2004   Lumbar 4- 5  . POLYPECTOMY  07/14/2016   Procedure: POLYPECTOMY;  Surgeon: Danie Binder, MD;  [facility];  Service: Endoscopy;;  hepatic flexure  . TOTAL KNEE ARTHROPLASTY Right 2013  . TUBAL LIGATION      Family History  Problem Relation Age of Onset  . Heart attack Father   . Stroke Mother   . Diabetes Brother   . Hypertension Brother   . Diabetes Brother   . Hypertension Brother   . Diabetes Maternal Grandmother   . Cancer Maternal Aunt        cervical  . Diabetes Paternal Grandmother    Social History:  reports that she quit smoking about 30 years ago. Her smoking use included cigarettes. She has a 5.00 pack-year smoking history. She has never used smokeless tobacco. She reports current alcohol use. She reports that she  does not use drugs.  Allergies:  Allergies  Allergen Reactions  . Naprosyn [Naproxen] Nausea Only    Can take Alleve but years ago patient took a liquid form of this medication and it made me nauseated    Medications Prior to Admission  Medication Sig Dispense Refill  . acetaminophen (TYLENOL) 500 MG tablet Take 500-1,000 mg by mouth every 4 (four) hours as needed (for pain.).    Marland Kitchen escitalopram (LEXAPRO) 10 MG tablet TAKE 1 TABLET BY MOUTH EVERY MORNING (Patient taking differently: Take 10 mg by mouth daily. TAKE 1 TABLET BY MOUTH EVERY MORNING) 90 tablet 1  . famotidine (PEPCID) 20 MG tablet Take 20 mg by mouth daily as needed (acid  reflux/indigestion.).     Marland Kitchen glipiZIDE (GLUCOTROL) 5 MG tablet Take one tablet in the morning and one and a half tablets in the evening (Patient taking differently: Take 5-7.5 mg by mouth See admin instructions. Take 1 tablet (5 mg) by mouth in the morning & 1.5 tablets (7.5 mg) by mouth in the evening.) 225 tablet 1  . metFORMIN (GLUCOPHAGE) 500 MG tablet 2 TABLETS TWICE DAILY (Patient taking differently: Take 1,000 mg by mouth 2 (two) times daily. ) 360 tablet 1  . nystatin (MYCOSTATIN/NYSTOP) powder APPLY 3 (THREE) TIMES DAILY TOPICALLY. (Patient taking differently: Apply 1 g topically 3 (three) times daily. ) 60 g 1  . pantoprazole (PROTONIX) 40 MG tablet TAKE 1 TABLET (40 MG TOTAL) BY MOUTH DAILY. TAKE 30-60 MIN BEFORE FIRST MEAL OF THE DAY 90 tablet 0  . simvastatin (ZOCOR) 40 MG tablet TAKE 1 TABLET (40 MG TOTAL) BY MOUTH DAILY AT 6 PM. (Patient taking differently: Take 40 mg by mouth daily. ) 90 tablet 1  . Specialty Vitamins Products (ULTIMATE FAT BURNER PO) Take 1 capsule by mouth daily. [place]    . telmisartan-hydrochlorothiazide (MICARDIS HCT) 40-12.5 MG tablet Take 1 tablet by mouth daily. 90 tablet 1  . thyroid (ARMOUR) 30 MG tablet Take 2 tablets (60 mg total) by mouth daily before breakfast. 60 tablet 5  . glucose blood (ONETOUCH ULTRA) test strip USE TO TEST BLOOD SUGAR ONCE DAILY. FOR ICD 10 - E11.9 100 strip 1  . OneTouch Delica Lancets 99991111 MISC USE TO OBTAIN A BLOOD SPECIMEN TO TEST BLOOD SUGAR 100 each 1    Results for orders placed or performed during the hospital encounter of 02/11/19 (from the past 48 hour(s))  SARS CORONAVIRUS 2 (TAT 6-24 HRS) Nasopharyngeal Nasopharyngeal Swab     Status: None   Collection Time: 02/10/19 12:20 PM   Specimen: Nasopharyngeal Swab  Result Value Ref Range   SARS Coronavirus 2 NEGATIVE NEGATIVE    Comment: (NOTE) SARS-CoV-2 target nucleic acids are NOT DETECTED. The SARS-CoV-2 RNA is generally detectable in upper and lower respiratory  specimens during the acute phase of infection. Negative results do not preclude SARS-CoV-2 infection, do not rule out co-infections with other pathogens, and should not be used as the sole basis for treatment or other patient management decisions. Negative results must be combined with clinical observations, patient history, and epidemiological information. The expected result is Negative. Fact Sheet for Patients: SugarRoll.be Fact Sheet for Healthcare Providers: https://www.woods-mathews.com/ This test is not yet approved or cleared by the [place] FDA and  has been authorized for detection and/or diagnosis of SARS-CoV-2 by FDA under an Emergency Use Authorization (EUA). This EUA will remain  in effect (meaning this test can be used) for the duration of the COVID-19 declaration under  Section 56 4(b)(1) of the Act, 21 U.S.C. section 360bbb-3(b)(1), unless the authorization is terminated or revoked sooner. Performed at [facility], [place] [address], [place], [place] [address]   Glucose, capillary     Status: Abnormal   Collection Time: 02/11/19  6:36 AM  Result Value Ref Range   Glucose-Capillary 120 (H) 70 - 99 mg/dL   No results found.  Review of Systems  Musculoskeletal: Positive for joint pain.  All other systems reviewed and are negative.   Blood pressure (!) 179/80, pulse 74, temperature 97.6 F (36.4 C), temperature source Oral, resp. rate 20, SpO2 96 %. Physical Exam  Constitutional: She appears well-developed.  HENT:  Head: Normocephalic.  Eyes: Pupils are equal, round, and reactive to light.  Neck: Normal range of motion.  Cardiovascular: Normal rate.  Respiratory: Effort normal.  Neurological: She is alert.  Skin: Skin is warm.  Psychiatric: She has a normal mood and affect.  Examination of the right shoulder demonstrates good subscap strength but significant infraspinatus and supraspinatus weakness.  Passive  range of motion is maintained.  tor sensory function of the hand is intact.  Assessment/Plan Impression is right shoulder traumatic rotator cuff tear about 6 months old with some wasting of the infraspinatus and supraspinatus muscle bellies at that time.  Rotator cuff tear repair can be attempted; however I think based on her functional deficits which predominates over pain that tendon transfer could be a good bridge prior to reverse shoulder replacement.  The risk and benefits are discussed including but not limited to infection nerve vessel damage incomplete restoration of function as well as potential need for more surgery in the future.  Extended amount of rehabilitation required for tendon transfer as well as rotator cuff repair is also discussed.  All questions answered.  Anderson Malta, MD 02/11/2019, 6:51 AM

## 2019-02-11 NOTE — Anesthesia Procedure Notes (Signed)
Anesthesia Regional Block: Interscalene brachial plexus block   Pre-Anesthetic Checklist: ,, timeout performed, Correct Patient, Correct Site, Correct Laterality, Correct Procedure, Correct Position, site marked, Risks and benefits discussed,  Surgical consent,  Pre-op evaluation,  At surgeon's request and post-op pain management  Laterality: Right  Prep: chloraprep       Needles:  Injection technique: Single-shot  Needle Type: Echogenic Stimulator Needle     Needle Length: 9cm  Needle Gauge: 21     Additional Needles:   Procedures:,,,, ultrasound used (permanent image in chart),,,,  Narrative:  Start time: 02/11/2019 7:05 AM End time: 02/11/2019 7:09 AM Injection made incrementally with aspirations every 5 mL.  Performed by: Personally  Anesthesiologist: Lidia Collum, MD  Additional Notes: Monitors applied. Injection made in 5cc increments. No resistance to injection. Good needle visualization. Patient tolerated procedure well.

## 2019-02-11 NOTE — Anesthesia Postprocedure Evaluation (Signed)
Anesthesia Post Note  Patient: [name]  Procedure(s) Performed: right shoulde arthroscopy, biceps tenodesis lower trapezius tendon transfer (Right )     Patient location during evaluation: PACU Anesthesia Type: General Level of consciousness: awake and alert Pain management: pain level controlled Vital Signs Assessment: post-procedure vital signs reviewed and stable Respiratory status: spontaneous breathing, nonlabored ventilation and respiratory function stable Cardiovascular status: blood pressure returned to baseline and stable Postop Assessment: no apparent nausea or vomiting Anesthetic complications: no    Last Vitals:  Vitals:   02/11/19 1300 02/11/19 1315  BP: 112/80   Pulse: 84   Resp: 14   Temp:  (!) 36.4 C  SpO2: 90% 94%    Last Pain:  Vitals:   02/11/19 1315  TempSrc:   PainSc: 0-No pain                 Lidia Collum

## 2019-02-11 NOTE — Transfer of Care (Signed)
Immediate Anesthesia Transfer of Care Note  Patient: [name]  Procedure(s) Performed: right shoulde arthroscopy, biceps tenodesis lower trapezius tendon transfer (Right )  Patient Location: PACU  Anesthesia Type:General  Level of Consciousness: awake, oriented, drowsy and patient cooperative  Airway & Oxygen Therapy: Patient Spontanous Breathing and Patient connected to face mask oxygen  Post-op Assessment: Report given to RN and Post -op Vital signs reviewed and stable  Post vital signs: Reviewed  Last Vitals:  Vitals Value Taken Time  BP    Temp    Pulse    Resp    SpO2      Last Pain:  Vitals:   02/11/19 0625  TempSrc: Oral  PainSc: 0-No pain         Complications: No apparent anesthesia complications

## 2019-02-11 NOTE — Anesthesia Procedure Notes (Signed)
Procedure Name: Intubation Date/Time: 02/11/2019 7:29 AM Performed by: Jenne Campus, CRNA Pre-anesthesia Checklist: Patient identified, Emergency Drugs available, Suction available and Patient being monitored Patient Re-evaluated:Patient Re-evaluated prior to induction Oxygen Delivery Method: Circle System Utilized Preoxygenation: Pre-oxygenation with 100% oxygen Induction Type: IV induction Ventilation: Mask ventilation without difficulty Laryngoscope Size: Miller and 2 Grade View: Grade I Tube type: Oral Tube size: 7.0 mm Number of attempts: 1 Airway Equipment and Method: Stylet Placement Confirmation: ETT inserted through vocal cords under direct vision,  positive ETCO2 and breath sounds checked- equal and bilateral Secured at: 20 cm Tube secured with: Tape Dental Injury: Teeth and Oropharynx as per pre-operative assessment

## 2019-02-11 NOTE — Op Note (Signed)
NAMEZANIYAH, TOTMAN MEDICAL RECORD [id number] ACCOUNT [id number] DATE OF BIRTH:[birth year] FACILITY: [facility] LOCATION: [facility] PHYSICIAN:Jyron Turman Randel Pigg, MD  OPERATIVE REPORT  DATE OF PROCEDURE:  02/11/2019  PREOPERATIVE DIAGNOSIS:  Right shoulder massive rotator cuff tear with biceps tendinitis.  POSTOPERATIVE DIAGNOSIS:  Right shoulder massive rotator cuff tear with biceps tendinitis.  PROCEDURE:  Right shoulder arthroscopy with biceps tendon release and limited debridement of superior labrum with subsequent open biceps tenodesis and open allograft lower trapezius tendon transfer.  SURGEON:  Meredith Pel, MD  ASSISTANT:  Annie Main, PA.  INDICATIONS:  The patient is a 63 year old patient with massive right shoulder rotator cuff tear.  She has failed conservative measures.  She has retraction and muscle atrophy.  Diabetes is under better control, which was on hold up from earlier surgery.   She presents now for operative management after explanation of risks and benefits.  She is on the young side for reverse shoulder replacement and this is being done as a potential bridge to needing reverse shoulder replacement.  PROCEDURE IN DETAIL:  The patient was brought to the operating room where general endotracheal anesthetic was induced.  Preoperative antibiotics administered.  Timeout was called.  Right shoulder.  The patient was placed in the beach chair position with  the head in neutral position and shifted about 2 inches to the right.  Right shoulder, arm and hand pre-scrubbed with hydrogen peroxide, alcohol and Betadine, which was allowed to air dry, then prepped and DuraPrep solution and draped in a sterile  manner.  Ioban used to cover the operative field.  Posterior portal was created 2 cm medial and inferior to the posterolateral margin of acromion.  Diagnostic arthroscopy was performed.  The patient had essentially an irreparable rotator cuff tear with  retraction to the  glenoid rim.  Following this the rotator cuff evaluation, the patient was noted to have significant biceps tendinitis and fraying.  The biceps tendon was released after creating an anterior portal under direct visualization.  The  Arthrocare wand was used to release the biceps tendon and debrided the superior labrum.  The glenohumeral articular surfaces were generally intact.  At this time, the incision was made off the anterolateral margin of the acromion.  Deltoid split between  anterior middle raphae and measured a distance of 4 cm and marked with #1 Vicryl suture.  Biceps tendon was tenodesed within the bicipital groove under appropriate tension using knotless suture tacks.  This gave very secure fixation.  Attention then  directed towards the rotator cuff, which was not repairable.  The footprint was prepared on the greater tuberosity using a knife and rongeur.  Concurrently on the back table the Achilles allograft was being prepared by Annie Main, PA.  After  preparation of the greater tuberosity, attention was directed towards the trapezial region.  Over the superior medial corner of the scapula an incision was made measuring about 7 cm.  Skin and subcutaneous tissue were sharply divided.  The medial corner  of the scapula was identified.  At the ridge of the supraspinatus fossa, the trapezius tendon of the lower trapezius was detached and detachment was mobilized just to but not medial to the medial corner of the scapula.  A stay suture was placed within  the tendon.  Mobilization was performed.  Care was taken to avoid any injury to the spinal accessory nerve 2 cm medial to the superior medial border of the scapula.  Once the tendon was mobilized it had good excursion.  The Achilles allograft was then  passed after incising the infraspinatus fascia.  The tendon was then passed and delivered into the lower trapezial region.  It was then fixed on the humeral head using Arthrex SwiveLock x2 anteriorly  and x2 posteriorly.  This definitely reestablished the  footprint of both the supraspinatus and infraspinatus.  Following this, the arm was taken in about 50 degrees of adduction and 50 degrees of external rotation.  A longitudinal split was made in the lower trapezius tendon and half of the allograft tendon  was passed through this.  Under appropriate tensions, the allograft was sutured to the lower trapezius tendon using suture tape from Arthrex.  This gave a very good secure repair and interface between the Achilles tendon allograft and the lower  trapezius tendon.  Excursion was good when the arm was taken through a little range of motion.  At this time, with the arm positioned in 50 degrees of abduction and 50 degrees of external rotation, thorough irrigation of all joints was performed.  The  portals were then closed using 3-0 nylon suture.  Both incisions after irrigation with IrriSept were closed using #1 Vicryl suture, 0 Vicryl suture, 2-0 Vicryl suture and a 3-0 Monocryl.  The deltoid split was closed using the #1 Vicryl, 0 Vicryl, 2-0  Vicryl and 3-0 Monocryl.  Aquacel dressing was placed.  The patient was then placed into a splint, which kept the arm in about 50 degrees of abduction, 50 degrees of external rotation.  The splint was difficult to fit on her because of her body habitus,  but it was prefitted which did increase the ease of that.  The patient tolerated the procedure well without immediate complications, transferred to recovery room in stable condition.  Luke's assistance was required at all times during the case for  retraction as well as mobilization of tissues.  His assistance was of medical necessity.  TN/NUANCE  D:02/11/2019 T:02/11/2019 JOB:[id number]

## 2019-02-12 ENCOUNTER — Encounter (HOSPITAL_COMMUNITY): Payer: Self-pay | Admitting: Orthopedic Surgery

## 2019-02-18 ENCOUNTER — Telehealth: Payer: Self-pay

## 2019-02-18 NOTE — Telephone Encounter (Signed)
Please advise. thank

## 2019-02-18 NOTE — Telephone Encounter (Signed)
Patient called stating that she had right shoulder surgery with Dr. Marlou Sa on Tuesday, 02/11/2019 and stated that her legs have been swelling the last couple of days and the swelling goes down sometimes at night.  Would like to know if this is normal?  Cb# is [phone].  Please advise.  Thank you.

## 2019-02-19 ENCOUNTER — Encounter: Payer: Self-pay | Admitting: Orthopedic Surgery

## 2019-02-19 ENCOUNTER — Other Ambulatory Visit: Payer: Self-pay

## 2019-02-19 ENCOUNTER — Ambulatory Visit (INDEPENDENT_AMBULATORY_CARE_PROVIDER_SITE_OTHER): Payer: Medicare HMO | Admitting: Orthopedic Surgery

## 2019-02-19 DIAGNOSIS — S46011A Strain of muscle(s) and tendon(s) of the rotator cuff of right shoulder, initial encounter: Secondary | ICD-10-CM

## 2019-02-19 MED ORDER — METHOCARBAMOL 500 MG PO TABS: 500.0000 mg | ORAL_TABLET | Freq: Three times a day (TID) | ORAL | 0 refills | Status: DC | PRN

## 2019-02-19 MED ORDER — OXYCODONE HCL 5 MG PO TABS: 5.0000 mg | ORAL_TABLET | Freq: Three times a day (TID) | ORAL | 0 refills | Status: DC | PRN

## 2019-02-19 NOTE — Telephone Encounter (Signed)
IC s/w patient she will be evaluated this afternoon

## 2019-02-19 NOTE — Progress Notes (Signed)
Post-Op Visit Note   Patient: [name]           Date of Birth: [birth year]           MRN: [id number] Visit Date: 02/19/2019 PCP: Mikey Kirschner, MD   Assessment & Plan:  Chief Complaint:  Chief Complaint  Patient presents with  . Right Shoulder - Routine Post Op   Visit Diagnoses:  1. Traumatic complete tear of right rotator cuff, initial encounter     Plan: [place] is a patient is now about a week out right shoulder rotator cuff debridement and lower traction and transfer.  On exam incisions are intact.  Brace fits well.  Plan is 3-week return stay in brace at all times refill oxygen Robaxin we will likely start her in some therapy in 3 weeks which will be very passive.  Follow-Up Instructions: Return in about 3 weeks (around 03/12/2019).   Orders:  No orders of the defined types were placed in this encounter.  Meds ordered this encounter  Medications  . oxyCODONE (ROXICODONE) 5 MG immediate release tablet    Sig: Take 1 tablet (5 mg total) by mouth every 8 (eight) hours as needed.    Dispense:  42 tablet    Refill:  0  . methocarbamol (ROBAXIN) 500 MG tablet    Sig: Take 1 tablet (500 mg total) by mouth every 8 (eight) hours as needed for muscle spasms.    Dispense:  30 tablet    Refill:  0    Imaging: No results found.  PMFS History: Patient Active Problem List   Diagnosis Date Noted  . Upper airway cough syndrome 07/30/2018  . Morbid (severe) obesity due to excess calories ([place]) 07/30/2018  . Urinary, incontinence, stress female 05/15/2018  . Encounter for screening breast examination 05/15/2018  . Depression, major, single episode, moderate ([place]) 10/17/2017  . Hypothyroidism 09/19/2017  . Special screening for malignant neoplasms, colon   . Moody 12/30/2014  . Counseling for estrogen replacement therapy 12/30/2014  . 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 Mixed stress and urge urinary incontinence 02/10/2014  . Diabetes ([place]) 01/18/2014  .  Posterior tibialis muscle dysfunction 10/24/2013  . Hyperlipidemia LDL goal <100 03/10/2013  . Superficial fungus infection of skin 07/05/2012  . Hot flashes 07/05/2012  . Rectocele 07/05/2012  . Essential hypertension 07/04/2012  . Hx of cervical cancer 07/04/2012  . Stiffness of joint, lower leg, left 08/28/2011  . Difficulty in walking(719.7) 08/28/2011  . Pain in left knee 08/28/2011   Past Medical History:  Diagnosis Date  . Arthritis    "fingers occasionally" (08/26/2014)  . Asthmatic bronchitis    "haven't used an inhaler in years" (08/26/2014)  . Cervical cancer ([place])   . Counseling for estrogen replacement therapy 12/30/2014  . Depression   . Diabetes mellitus without complication ([place])    type- 2  . Elevated cholesterol   . Exertional shortness of breath   . Family history of adverse reaction to anesthesia    "I think my mama gets sick"  . GERD (gastroesophageal reflux disease)   . Hot flashes 07/05/2012  . Hypertension   . Hypothyroidism   . IFG (impaired fasting glucose)   . Mixed stress and urge urinary incontinence   . Moody 12/30/2014  . Obesity   . Panniculus   . PONV (postoperative nausea and vomiting)   . Rectocele 07/05/2012  . Stress incontinence   . Superficial fungus infection of skin  Family History  Problem Relation Age of Onset  . Heart attack Father   . Stroke Mother   . Diabetes Brother   . Hypertension Brother   . Diabetes Brother   . Hypertension Brother   . Diabetes Maternal Grandmother   . Cancer Maternal Aunt        cervical  . Diabetes Paternal Grandmother     Past Surgical History:  Procedure Laterality Date  . ABDOMINAL HYSTERECTOMY  1986   "partial"  . ANKLE FUSION Left 10/24/2013   Procedure: LEFT ANKLE SUBTALAR AND TALONAVICULAR FUSION;  Surgeon: Newt Minion, MD;  Location: [facility];  Service: Orthopedics;  Laterality: Left;  . ANKLE FUSION Left 08/26/2014   Procedure: Left Foot Take Down Non-union with Revision Talonavicular  and Subtalar Fusion;  Surgeon: Newt Minion, MD;  Location: [facility];  Service: Orthopedics;  Laterality: Left;  . BACK SURGERY    . BILATERAL SALPINGECTOMY  2010   w/LOA  . COLONOSCOPY    . COLONOSCOPY N/A 07/14/2016   Procedure: COLONOSCOPY;  Surgeon: Danie Binder, MD;  [facility];  Service: Endoscopy;  Laterality: N/A;  8:30  . FUSION OF TALONAVICULAR JOINT Left    Take Down Non-union with Revision Talonavicular and Subtalar Fusion /notes 08/26/2014  . HAMMER TOE SURGERY Bilateral 2000-2013   right-left  . JOINT REPLACEMENT    . KNEE ARTHROSCOPY Bilateral 2008-2010    left-right  . LUMBAR DISC SURGERY  2004   Lumbar 4- 5  . POLYPECTOMY  07/14/2016   Procedure: POLYPECTOMY;  Surgeon: Danie Binder, MD;  [facility];  Service: Endoscopy;;  hepatic flexure  . SHOULDER ARTHROSCOPY WITH SUBACROMIAL DECOMPRESSION, ROTATOR CUFF REPAIR AND BICEP TENDON REPAIR Right 02/11/2019   Procedure: right shoulde arthroscopy, biceps tenodesis lower trapezius tendon transfer;  Surgeon: Meredith Pel, MD;  Location: [facility];  Service: Orthopedics;  Laterality: Right;  . TOTAL KNEE ARTHROPLASTY Right 2013  . TUBAL LIGATION     Social History   Occupational History  . Not on file  Tobacco Use  . Smoking status: Former Smoker    Packs/day: 0.50    Years: 10.00    Pack years: 5.00    Types: Cigarettes    Quit date: 03/27/1988    Years since quitting: 30.9  . Smokeless tobacco: Never Used  Substance and Sexual Activity  . Alcohol use: Yes    Comment: occasionally a beer  . Drug use: No  . Sexual activity: Not Currently    Birth control/protection: Surgical    Comment: hyst

## 2019-02-22 DIAGNOSIS — R69 Illness, unspecified: Secondary | ICD-10-CM | POA: Diagnosis not present

## 2019-03-04 ENCOUNTER — Other Ambulatory Visit: Payer: Self-pay | Admitting: Family Medicine

## 2019-03-12 ENCOUNTER — Encounter: Payer: Self-pay | Admitting: Orthopedic Surgery

## 2019-03-12 ENCOUNTER — Other Ambulatory Visit: Payer: Self-pay

## 2019-03-12 ENCOUNTER — Ambulatory Visit (INDEPENDENT_AMBULATORY_CARE_PROVIDER_SITE_OTHER): Payer: Medicare HMO | Admitting: Orthopedic Surgery

## 2019-03-12 DIAGNOSIS — Z9889 Other specified postprocedural states: Secondary | ICD-10-CM

## 2019-03-12 DIAGNOSIS — S46011D Strain of muscle(s) and tendon(s) of the rotator cuff of right shoulder, subsequent encounter: Secondary | ICD-10-CM

## 2019-03-12 NOTE — Progress Notes (Signed)
Post-Op Visit Note   Patient: [name]           Date of Birth: 10-Dec-1955           MRN: [id number] Visit Date: 03/12/2019 PCP: Mikey Kirschner, MD   Assessment & Plan:  Chief Complaint:  Chief Complaint  Patient presents with  . Right Shoulder - Follow-up   Visit Diagnoses:  1. Status post shoulder surgery   2. Traumatic complete tear of right rotator cuff, subsequent encounter     Plan: Patient is a 63 year old female who presents s/p right shoulder lower trapezius tendon transfer on 02/11/2019.  Patient is 4 weeks and 1 day out from surgery.  She has remained in brace since the surgery, never taking off the brace and only sponge bathing around the brace.  On exam she has significant stiffness of all 5 fingers, wrists, elbow, shoulder.  We will discontinue the brace today.  She will start the lower trapezius tendon transfer protocol in therapy at [facility] health outpatient therapy in [place].  She will finish off phase 1 which includes active range of motion of the fingers, wrist, elbow, cervical spine with some small active range of motion exercises for the trapezius muscle.  She will do phase 1 exercises for the next 2 weeks and then transition to phase 2.  Her incisions are healing well and sutures were removed today.  She does have some skin breakdown on the right side of her abdomen where the brace was putting pressure but this has no drainage and does not appear to be infected at this time.  Patient's shoulder is appropriately stiff.  Cautioned patient against cross body adduction or internal rotation of the right shoulder.  Patient will follow up in 4 weeks for clinical recheck.    Follow-Up Instructions: No follow-ups on file.   Orders:  No orders of the defined types were placed in this encounter.  No orders of the defined types were placed in this encounter.   Imaging: No results found.  PMFS History: Patient Active Problem List   Diagnosis Date Noted  . Upper  airway cough syndrome 07/30/2018  . Morbid (severe) obesity due to excess calories ([place]) 07/30/2018  . Urinary, incontinence, stress female 05/15/2018  . Encounter for screening breast examination 05/15/2018  . Depression, major, single episode, moderate ([place]) 10/17/2017  . Hypothyroidism 09/19/2017  . Special screening for malignant neoplasms, colon   . Moody 12/30/2014  . Counseling for estrogen replacement therapy 12/30/2014  . 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 Mixed stress and urge urinary incontinence 02/10/2014  . Diabetes ([place]) 01/18/2014  . Posterior tibialis muscle dysfunction 10/24/2013  . Hyperlipidemia LDL goal <100 03/10/2013  . Superficial fungus infection of skin 07/05/2012  . Hot flashes 07/05/2012  . Rectocele 07/05/2012  . Essential hypertension 07/04/2012  . Hx of cervical cancer 07/04/2012  . Stiffness of joint, lower leg, left 08/28/2011  . Difficulty in walking(719.7) 08/28/2011  . Pain in left knee 08/28/2011   Past Medical History:  Diagnosis Date  . Arthritis    "fingers occasionally" (08/26/2014)  . Asthmatic bronchitis    "haven't used an inhaler in years" (08/26/2014)  . Cervical cancer ([place])   . Counseling for estrogen replacement therapy 12/30/2014  . Depression   . Diabetes mellitus without complication ([place])    type- 2  . Elevated cholesterol   . Exertional shortness of breath   . Family history of adverse reaction to anesthesia    "  I think my mama gets sick"  . GERD (gastroesophageal reflux disease)   . Hot flashes 07/05/2012  . Hypertension   . Hypothyroidism   . IFG (impaired fasting glucose)   . Mixed stress and urge urinary incontinence   . Moody 12/30/2014  . Obesity   . Panniculus   . PONV (postoperative nausea and vomiting)   . Rectocele 07/05/2012  . Stress incontinence   . Superficial fungus infection of skin     Family History  Problem Relation Age of Onset  . Heart attack Father   . Stroke Mother   . Diabetes Brother     . Hypertension Brother   . Diabetes Brother   . Hypertension Brother   . Diabetes Maternal Grandmother   . Cancer Maternal Aunt        cervical  . Diabetes Paternal Grandmother     Past Surgical History:  Procedure Laterality Date  . ABDOMINAL HYSTERECTOMY  1986   "partial"  . ANKLE FUSION Left 10/24/2013   Procedure: LEFT ANKLE SUBTALAR AND TALONAVICULAR FUSION;  Surgeon: Newt Minion, MD;  Location: [facility];  Service: Orthopedics;  Laterality: Left;  . ANKLE FUSION Left 08/26/2014   Procedure: Left Foot Take Down Non-union with Revision Talonavicular and Subtalar Fusion;  Surgeon: Newt Minion, MD;  Location: [facility];  Service: Orthopedics;  Laterality: Left;  . BACK SURGERY    . BILATERAL SALPINGECTOMY  2010   w/LOA  . COLONOSCOPY    . COLONOSCOPY N/A 07/14/2016   Procedure: COLONOSCOPY;  Surgeon: Danie Binder, MD;  [facility];  Service: Endoscopy;  Laterality: N/A;  8:30  . FUSION OF TALONAVICULAR JOINT Left    Take Down Non-union with Revision Talonavicular and Subtalar Fusion /notes 08/26/2014  . HAMMER TOE SURGERY Bilateral 2000-2013   right-left  . JOINT REPLACEMENT    . KNEE ARTHROSCOPY Bilateral 2008-2010    left-right  . LUMBAR DISC SURGERY  2004   Lumbar 4- 5  . POLYPECTOMY  07/14/2016   Procedure: POLYPECTOMY;  Surgeon: Danie Binder, MD;  [facility];  Service: Endoscopy;;  hepatic flexure  . SHOULDER ARTHROSCOPY WITH SUBACROMIAL DECOMPRESSION, ROTATOR CUFF REPAIR AND BICEP TENDON REPAIR Right 02/11/2019   Procedure: right shoulde arthroscopy, biceps tenodesis lower trapezius tendon transfer;  Surgeon: Meredith Pel, MD;  Location: [facility];  Service: Orthopedics;  Laterality: Right;  . TOTAL KNEE ARTHROPLASTY Right 2013  . TUBAL LIGATION     Social History   Occupational History  . Not on file  Tobacco Use  . Smoking status: Former Smoker    Packs/day: 0.50    Years: 10.00    Pack years: 5.00    Types: Cigarettes    Quit date:  03/27/1988    Years since quitting: 30.9  . Smokeless tobacco: Never Used  Substance and Sexual Activity  . Alcohol use: Yes    Comment: occasionally a beer  . Drug use: No  . Sexual activity: Not Currently    Birth control/protection: Surgical    Comment: hyst

## 2019-03-19 ENCOUNTER — Encounter (HOSPITAL_COMMUNITY): Payer: Self-pay | Admitting: Occupational Therapy

## 2019-03-19 ENCOUNTER — Ambulatory Visit (HOSPITAL_COMMUNITY): Payer: Medicare HMO | Attending: Orthopedic Surgery | Admitting: Occupational Therapy

## 2019-03-19 ENCOUNTER — Other Ambulatory Visit: Payer: Self-pay

## 2019-03-19 DIAGNOSIS — M25611 Stiffness of right shoulder, not elsewhere classified: Secondary | ICD-10-CM

## 2019-03-19 DIAGNOSIS — M25511 Pain in right shoulder: Secondary | ICD-10-CM | POA: Diagnosis not present

## 2019-03-19 DIAGNOSIS — R29898 Other symptoms and signs involving the musculoskeletal system: Secondary | ICD-10-CM | POA: Insufficient documentation

## 2019-03-19 NOTE — Therapy (Signed)
[place], [place], [address] Phone: [phone]   Fax:  [phone]  Occupational Therapy Evaluation  Patient Details  Name: [name] MRN: [id number] Date of Birth: 12-26-1955 Referring Provider (OT): Dr. Meredith Pel   Encounter Date: 03/19/2019  OT End of Session - 03/19/19 1442    Visit Number  1    Number of Visits  16    Date for OT Re-Evaluation  05/18/19   mini-reassessment 04/15/2018   Authorization Type  [employer] Medicare; $35 copay    Authorization Time Period  progress note by visit 10    Authorization - Visit Number  1    Authorization - Number of Visits  10    OT Start Time  [id number]    OT Stop Time  1425    OT Time Calculation (min)  31 min    Activity Tolerance  Patient tolerated treatment well    Behavior During Therapy  [facility] for tasks assessed/performed       Past Medical History:  Diagnosis Date  . Arthritis    "fingers occasionally" (08/26/2014)  . Asthmatic bronchitis    "haven't used an inhaler in years" (08/26/2014)  . Cervical cancer ([place])   . Counseling for estrogen replacement therapy 12/30/2014  . Depression   . Diabetes mellitus without complication ([place])    type- 2  . Elevated cholesterol   . Exertional shortness of breath   . Family history of adverse reaction to anesthesia    "I think my mama gets sick"  . GERD (gastroesophageal reflux disease)   . Hot flashes 07/05/2012  . Hypertension   . Hypothyroidism   . IFG (impaired fasting glucose)   . Mixed stress and urge urinary incontinence   . Moody 12/30/2014  . Obesity   . Panniculus   . PONV (postoperative nausea and vomiting)   . Rectocele 07/05/2012  . Stress incontinence   . Superficial fungus infection of skin     Past Surgical History:  Procedure Laterality Date  . ABDOMINAL HYSTERECTOMY  1986   "partial"  . ANKLE FUSION Left 10/24/2013   Procedure: LEFT ANKLE SUBTALAR AND TALONAVICULAR FUSION;  Surgeon:  Newt Minion, MD;  Location: [place];  Service: Orthopedics;  Laterality: Left;  . ANKLE FUSION Left 08/26/2014   Procedure: Left Foot Take Down Non-union with Revision Talonavicular and Subtalar Fusion;  Surgeon: Newt Minion, MD;  Location: [place];  Service: Orthopedics;  Laterality: Left;  . BACK SURGERY    . BILATERAL SALPINGECTOMY  2010   w/LOA  . COLONOSCOPY    . COLONOSCOPY N/A 07/14/2016   Procedure: COLONOSCOPY;  Surgeon: Danie Binder, MD;  [facility];  Service: Endoscopy;  Laterality: N/A;  8:30  . FUSION OF TALONAVICULAR JOINT Left    Take Down Non-union with Revision Talonavicular and Subtalar Fusion /notes 08/26/2014  . HAMMER TOE SURGERY Bilateral 2000-2013   right-left  . JOINT REPLACEMENT    . KNEE ARTHROSCOPY Bilateral 2008-2010    left-right  . LUMBAR DISC SURGERY  2004   Lumbar 4- 5  . POLYPECTOMY  07/14/2016   Procedure: POLYPECTOMY;  Surgeon: Danie Binder, MD;  [facility];  Service: Endoscopy;;  hepatic flexure  . SHOULDER ARTHROSCOPY WITH SUBACROMIAL DECOMPRESSION, ROTATOR CUFF REPAIR AND BICEP TENDON REPAIR Right 02/11/2019   Procedure: right shoulde arthroscopy, biceps tenodesis lower trapezius tendon transfer;  Surgeon: Meredith Pel, MD;  Location: [place];  Service: Orthopedics;  Laterality: Right;  . TOTAL KNEE ARTHROPLASTY Right 2013  . TUBAL LIGATION      There were no vitals filed for this visit.  Subjective Assessment - 03/19/19 1437    Subjective   S: I've been using it some and it's getting better slowly.    Pertinent History  Pt is a 63 y/o female s/p right lower trapezius tendon transfers on 02/11/2019 after injuring her shoulder during a fall in May. Pt was referred to occupational therapy for evaluation and treatment by Dr. Meredith Pel.    Special Tests  FOTO: 41/100    Patient Stated Goals  To be able to reach for things with my right arm    Currently in Pain?  No/denies        [facility] OT Assessment - 03/19/19  1355      Assessment   Medical Diagnosis  s/p right lower trapezius tendon transfer    Referring Provider (OT)  Dr. Meredith Pel    Onset Date/Surgical Date  02/11/19    Hand Dominance  Right    Prior Therapy  None      Precautions   Precautions  Shoulder    Type of Shoulder Precautions  Pt will bring protocol next session. Per last note: phase I until 12/30-P/ROM, gentle scapular A/ROM, Transition to phase II 12/31. No cross body adduction or IR      Balance Screen   Has the patient fallen in the past 6 months  No      Prior Function   Level of Independence  Independent with basic ADLs    Vocation  Retired    Leisure  social outings, shopping, spending time with family      ADL   ADL comments  Pt is having difficulty with donning bra, putting dishes away, reaching up and behind back, driving, sleeping.      Written Expression   Dominant Hand  Right      Cognition   Overall Cognitive Status  Within Functional Limits for tasks assessed      Observation/Other Assessments   Focus on Therapeutic Outcomes (FOTO)   41/100      ROM / Strength   AROM / PROM / Strength  AROM;PROM;Strength      Palpation   Palpation comment  Min fascial restrictions in upper arm and trapeizus regions      AROM   Overall AROM Comments  Assessed seated, er/IR adducted    AROM Assessment Site  Shoulder    Right/Left Shoulder  Right    Right Shoulder Flexion  45 Degrees    Right Shoulder ABduction  45 Degrees    Right Shoulder Internal Rotation  30 Degrees   adducted baseline   Right Shoulder External Rotation  33 Degrees      PROM   Overall PROM Comments  Assessed supine, er/IR abducted    PROM Assessment Site  Shoulder    Right/Left Shoulder  Right    Right Shoulder Flexion  112 Degrees    Right Shoulder ABduction  110 Degrees    Right Shoulder Internal Rotation  51 Degrees    Right Shoulder External Rotation  52 Degrees      Strength   Overall Strength  Unable to assess;Due to  precautions                        OT Short Term Goals - 03/19/19 1445  OT SHORT TERM GOAL #1   Title  Pt will be provided with and educated on HEP to improve mobility required for ADL completion using RUE as dominant.    Time  4    Period  Weeks    Status  New    Target Date  04/18/19      OT SHORT TERM GOAL #2   Title  Pt will increase P/ROM of RUE to [facility] to improve ability to perform dressing tasks.    Time  4    Period  Weeks    Status  New      OT SHORT TERM GOAL #3   Title  Pt will increase RUE strength to 3/5 to improve ability to perform bathing and grooming tasks using RUE.    Time  4    Period  Weeks    Status  New        OT Long Term Goals - 03/19/19 1447      OT LONG TERM GOAL #1   Title  Pt will return to highest level of functioning using RUE as dominant during ADL and leisure tasks.    Time  8    Period  Weeks    Status  New    Target Date  05/18/19      OT LONG TERM GOAL #2   Title  Pt will decrease pain in RUE to 3/10 or less to improve ability to sleep in her bed for 4 consecutive hours or greater.    Time  8    Period  Weeks    Status  New      OT LONG TERM GOAL #3   Title  Pt will increase RUE A/ROM to [facility] to improve ability to reach into overhead cabinets.    Time  8    Period  Weeks    Status  New      OT LONG TERM GOAL #4   Title  Pt will decrease RUE fascial restrictions to trace amounts to improve mobility required for functional reaching tasks.    Time  8    Period  Weeks    Status  New      OT LONG TERM GOAL #5   Title  Pt will increase RUE strength to 4+/5 or greater to improve ability to lift pots and pans during meal preparation tasks.    Time  8    Period  Weeks    Status  New            Plan - 03/19/19 1442    Clinical Impression Statement  A: Pt is a 63 y/o female s/p right lower trapezius tendon transfer on 02/11/19 presenting with RUE functional limitations during ADL tasks. Pt reports she was  in a static splint with shoulder abducted and elevated for 5 weeks.    OT Occupational Profile and History  Problem Focused Assessment - Including review of records relating to presenting problem    Occupational performance deficits (Please refer to evaluation for details):  ADL's;IADL's;Rest and Sleep;Leisure    Body Structure / Function / Physical Skills  ADL;Endurance;UE functional use;Fascial restriction;Pain;ROM;IADL;Strength    Rehab Potential  Good    Clinical Decision Making  Limited treatment options, no task modification necessary    Comorbidities Affecting Occupational Performance:  None    Modification or Assistance to Complete Evaluation   No modification of tasks or assist necessary to complete eval    OT Frequency  2x / week  OT Duration  8 weeks    OT Treatment/Interventions  Self-care/ADL training;Ultrasound;Patient/family education;Passive range of motion;Cryotherapy;Electrical Stimulation;Moist Heat;Therapeutic exercise;Manual Therapy;Therapeutic activities    Plan  P: Pt will benefit from skilled OT services to decrease pain and fascial restrictions, increase joint ROM, strength, and functional use of RUE as dominant. Treatment plan: myofascial release, manual therapy, P/ROM, AA/ROM, A/ROM, general RUE stability and strengthening, scapular stability and strengthening, modalities prn    Consulted and Agree with Plan of Care  Patient       Patient will benefit from skilled therapeutic intervention in order to improve the following deficits and impairments:   Body Structure / Function / Physical Skills: ADL, Endurance, UE functional use, Fascial restriction, Pain, ROM, IADL, Strength       Visit Diagnosis: Acute pain of right shoulder  Stiffness of right shoulder, not elsewhere classified  Other symptoms and signs involving the musculoskeletal system    Problem List Patient Active Problem List   Diagnosis Date Noted  . Upper airway cough syndrome 07/30/2018  .  Morbid (severe) obesity due to excess calories ([place]) 07/30/2018  . Urinary, incontinence, stress female 05/15/2018  . Encounter for screening breast examination 05/15/2018  . Depression, major, single episode, moderate ([place]) 10/17/2017  . Hypothyroidism 09/19/2017  . Special screening for malignant neoplasms, colon   . Moody 12/30/2014  . Counseling for estrogen replacement therapy 12/30/2014  . 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 Mixed stress and urge urinary incontinence 02/10/2014  . Diabetes ([place]) 01/18/2014  . Posterior tibialis muscle dysfunction 10/24/2013  . Hyperlipidemia LDL goal <100 03/10/2013  . Superficial fungus infection of skin 07/05/2012  . Hot flashes 07/05/2012  . Rectocele 07/05/2012  . Essential hypertension 07/04/2012  . Hx of cervical cancer 07/04/2012  . Stiffness of joint, lower leg, left 08/28/2011  . Difficulty in walking(719.7) 08/28/2011  . Pain in left knee 08/28/2011   Guadelupe Sabin, OTR/L  [phone] 03/19/2019, 2:50 PM  [facility] [address] [place], [place], [address] Phone: [phone]   Fax:  [phone]  Name: [name] MRN: [id number] Date of Birth: 05/10/1955

## 2019-03-19 NOTE — Patient Instructions (Signed)
SHOULDER: Flexion On Table   Place hands on towel placed on table, elbows straight. Lean forward with you upper body, pushing towel away from body.  _15__ reps per set, __3_ sets per day  Abduction (Passive)   With arm out to side, resting on towel placed on table with palm DOWN, keeping trunk away from table, lean to the side while pushing towel away from body.  Repeat __15__ times. Do __3__ sessions per day.  Copyright  VHI. All rights reserved.     Internal Rotation (Assistive)   Seated with elbow bent at right angle and held against side, slide arm on table surface in an inward arc keeping elbow anchored in place. Repeat __15__ times. Do _3___ sessions per day. Activity: Use this motion to brush crumbs off the table.  Copyright  VHI. All rights reserved.    

## 2019-03-25 ENCOUNTER — Ambulatory Visit (HOSPITAL_COMMUNITY): Payer: Medicare HMO

## 2019-03-25 ENCOUNTER — Other Ambulatory Visit: Payer: Self-pay

## 2019-03-25 ENCOUNTER — Encounter (HOSPITAL_COMMUNITY): Payer: Self-pay

## 2019-03-25 DIAGNOSIS — M25611 Stiffness of right shoulder, not elsewhere classified: Secondary | ICD-10-CM

## 2019-03-25 DIAGNOSIS — M25511 Pain in right shoulder: Secondary | ICD-10-CM

## 2019-03-25 DIAGNOSIS — R29898 Other symptoms and signs involving the musculoskeletal system: Secondary | ICD-10-CM | POA: Diagnosis not present

## 2019-03-25 NOTE — Patient Instructions (Signed)

## 2019-03-25 NOTE — Therapy (Signed)
[place], [place], [address] Phone: [phone]   Fax:  [phone]  Occupational Therapy Treatment  Patient Details  Name: [name] MRN: [id number] Date of Birth: 04-Aug-1955 Referring Provider (OT): Dr. Meredith Pel   Encounter Date: 03/25/2019  OT End of Session - 03/25/19 1057    Visit Number  2    Number of Visits  16    Date for OT Re-Evaluation  05/18/19   mini-reassessment 04/15/2018   Authorization Type  [employer] Medicare; $35 copay    Authorization Time Period  progress note by visit 10    Authorization - Visit Number  2    Authorization - Number of Visits  10    OT Start Time  1030    OT Stop Time  1108    OT Time Calculation (min)  38 min    Activity Tolerance  Patient tolerated treatment well    Behavior During Therapy  [facility] for tasks assessed/performed       Past Medical History:  Diagnosis Date  . Arthritis    "fingers occasionally" (08/26/2014)  . Asthmatic bronchitis    "haven't used an inhaler in years" (08/26/2014)  . Cervical cancer ([place])   . Counseling for estrogen replacement therapy 12/30/2014  . Depression   . Diabetes mellitus without complication ([place])    type- 2  . Elevated cholesterol   . Exertional shortness of breath   . Family history of adverse reaction to anesthesia    "I think my mama gets sick"  . GERD (gastroesophageal reflux disease)   . Hot flashes 07/05/2012  . Hypertension   . Hypothyroidism   . IFG (impaired fasting glucose)   . Mixed stress and urge urinary incontinence   . Moody 12/30/2014  . Obesity   . Panniculus   . PONV (postoperative nausea and vomiting)   . Rectocele 07/05/2012  . Stress incontinence   . Superficial fungus infection of skin     Past Surgical History:  Procedure Laterality Date  . ABDOMINAL HYSTERECTOMY  1986   "partial"  . ANKLE FUSION Left 10/24/2013   Procedure: LEFT ANKLE SUBTALAR AND TALONAVICULAR FUSION;  Surgeon: Newt Minion, MD;  Location: [place];  Service: Orthopedics;  Laterality: Left;  . ANKLE FUSION Left 08/26/2014   Procedure: Left Foot Take Down Non-union with Revision Talonavicular and Subtalar Fusion;  Surgeon: Newt Minion, MD;  Location: [place];  Service: Orthopedics;  Laterality: Left;  . BACK SURGERY    . BILATERAL SALPINGECTOMY  2010   w/LOA  . COLONOSCOPY    . COLONOSCOPY N/A 07/14/2016   Procedure: COLONOSCOPY;  Surgeon: Danie Binder, MD;  [facility];  Service: Endoscopy;  Laterality: N/A;  8:30  . FUSION OF TALONAVICULAR JOINT Left    Take Down Non-union with Revision Talonavicular and Subtalar Fusion /notes 08/26/2014  . HAMMER TOE SURGERY Bilateral 2000-2013   right-left  . JOINT REPLACEMENT    . KNEE ARTHROSCOPY Bilateral 2008-2010    left-right  . LUMBAR DISC SURGERY  2004   Lumbar 4- 5  . POLYPECTOMY  07/14/2016   Procedure: POLYPECTOMY;  Surgeon: Danie Binder, MD;  [facility];  Service: Endoscopy;;  hepatic flexure  . SHOULDER ARTHROSCOPY WITH SUBACROMIAL DECOMPRESSION, ROTATOR CUFF REPAIR AND BICEP TENDON REPAIR Right 02/11/2019   Procedure: right shoulde arthroscopy, biceps tenodesis lower trapezius tendon transfer;  Surgeon: Meredith Pel, MD;  Location: [place];  Service: Orthopedics;  Laterality: Right;  . TOTAL KNEE ARTHROPLASTY Right 2013  . TUBAL LIGATION      There were no vitals filed for this visit.  Subjective Assessment - 03/25/19 1055    Subjective   S: I did the dishes this morning.    Currently in Pain?  Yes    Pain Score  6     Pain Location  Wrist    Pain Orientation  Right    Pain Descriptors / Indicators  Aching;Sore;Numbness    Pain Type  Acute pain    Pain Radiating Towards  into hand    Pain Onset  More than a month ago    Pain Frequency  Constant    Aggravating Factors   hand positioning in surgery    Pain Relieving Factors  wearing copper fit glove, pain medication    Effect of Pain on Daily Activities  mod-max  effect    Multiple Pain Sites  No         [facility] OT Assessment - 03/25/19 1057      Assessment   Medical Diagnosis  s/p right lower trapezius tendon transfer      Precautions   Precautions  Shoulder    Type of Shoulder Precautions  See protocol               OT Treatments/Exercises (OP) - 03/25/19 1058      Exercises   Exercises  Shoulder      Shoulder Exercises: Supine   Protraction  PROM;5 reps;AAROM;10 reps    Horizontal ABduction  PROM;5 reps;AAROM;10 reps    External Rotation  PROM;5 reps;AAROM;10 reps    Internal Rotation  PROM;5 reps;AAROM;10 reps    Flexion  PROM;5 reps;AAROM;10 reps    ABduction  PROM;5 reps      Shoulder Exercises: Standing   Protraction  AAROM;10 reps    Flexion  AAROM;10 reps    Extension  AROM;10 reps      Shoulder Exercises: Therapy Ball   Flexion  Right;10 reps    ABduction  Right;10 reps      Manual Therapy   Manual Therapy  Myofascial release    Manual therapy comments  Manual therapy completed prior to exercises.     Myofascial Release  Myofascial release and manual stretching completed to right upper arm, trapezius, and scapularis region to decrease fascial restrctions and increase joint mobility in a pain free zone.              OT Education - 03/25/19 1057    Education Details  reviewed goals. AA/ROM supine. Omit abduction for now.    Person(s) Educated  Patient    Methods  Explanation;Demonstration;Verbal cues;Handout    Comprehension  Verbalized understanding;Returned demonstration       OT Short Term Goals - 03/25/19 1057      OT SHORT TERM GOAL #1   Title  Pt will be provided with and educated on HEP to improve mobility required for ADL completion using RUE as dominant.    Time  4    Period  Weeks    Status  On-going    Target Date  04/18/19      OT SHORT TERM GOAL #2   Title  Pt will increase P/ROM of RUE to [facility] to improve ability to perform dressing tasks.    Time  4    Period  Weeks    Status   On-going      OT SHORT TERM  GOAL #3   Title  Pt will increase RUE strength to 3/5 to improve ability to perform bathing and grooming tasks using RUE.    Time  4    Period  Weeks    Status  On-going        OT Long Term Goals - 03/25/19 1057      OT LONG TERM GOAL #1   Title  Pt will return to highest level of functioning using RUE as dominant during ADL and leisure tasks.    Time  8    Period  Weeks    Status  On-going      OT LONG TERM GOAL #2   Title  Pt will decrease pain in RUE to 3/10 or less to improve ability to sleep in her bed for 4 consecutive hours or greater.    Time  8    Period  Weeks    Status  On-going      OT LONG TERM GOAL #3   Title  Pt will increase RUE A/ROM to [facility] to improve ability to reach into overhead cabinets.    Time  8    Period  Weeks    Status  On-going      OT LONG TERM GOAL #4   Title  Pt will decrease RUE fascial restrictions to trace amounts to improve mobility required for functional reaching tasks.    Time  8    Period  Weeks    Status  On-going      OT LONG TERM GOAL #5   Title  Pt will increase RUE strength to 4+/5 or greater to improve ability to lift pots and pans during meal preparation tasks.    Time  8    Period  Weeks    Status  On-going            Plan - 03/25/19 1221    Clinical Impression Statement  A: Progressed to phase II of protocol. Patient brought copy into clinic. Pt was able to complete all supine AA/ROM exercises although abduction was omitted due to difficulty level. Presented with increased compensatory movement patterns when attempting standing AA/ROM. Pt's HEP was updated to include supine AA/ROM exercises. Discussed completing waist level activities at home with no lifting. VC for form and technique were provided. Manual techniques were completed to address fascial restrictions in the right upper arm region.    Body Structure / Function / Physical Skills  ADL;Endurance;UE functional use;Fascial  restriction;Pain;ROM;IADL;Strength    Plan  P: Attempt abduction AA/ROM supine. Work on progressing to completing all AA/ROM shoulder exercises standing. Add PVC pipe.    Consulted and Agree with Plan of Care  Patient       Patient will benefit from skilled therapeutic intervention in order to improve the following deficits and impairments:   Body Structure / Function / Physical Skills: ADL, Endurance, UE functional use, Fascial restriction, Pain, ROM, IADL, Strength       Visit Diagnosis: Acute pain of right shoulder  Stiffness of right shoulder, not elsewhere classified  Other symptoms and signs involving the musculoskeletal system    Problem List Patient Active Problem List   Diagnosis Date Noted  . Upper airway cough syndrome 07/30/2018  . Morbid (severe) obesity due to excess calories ([place]) 07/30/2018  . Urinary, incontinence, stress female 05/15/2018  . Encounter for screening breast examination 05/15/2018  . Depression, major, single episode, moderate ([place]) 10/17/2017  . Hypothyroidism 09/19/2017  . Special screening for malignant  neoplasms, colon   . Moody 12/30/2014  . Counseling for estrogen replacement therapy 12/30/2014  . 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 Mixed stress and urge urinary incontinence 02/10/2014  . Diabetes ([place]) 01/18/2014  . Posterior tibialis muscle dysfunction 10/24/2013  . Hyperlipidemia LDL goal <100 03/10/2013  . Superficial fungus infection of skin 07/05/2012  . Hot flashes 07/05/2012  . Rectocele 07/05/2012  . Essential hypertension 07/04/2012  . Hx of cervical cancer 07/04/2012  . Stiffness of joint, lower leg, left 08/28/2011  . Difficulty in walking(719.7) 08/28/2011  . Pain in left knee 08/28/2011   Ailene Ravel, OTR/L,CBIS  [phone]  03/25/2019, 12:24 PM  [facility] [address] [place], [place], [address] Phone: [phone]   Fax:  [phone]  Name: [name] MRN: [id number] Date of Birth: 03/02/1956

## 2019-03-29 DIAGNOSIS — R69 Illness, unspecified: Secondary | ICD-10-CM | POA: Diagnosis not present

## 2019-04-01 ENCOUNTER — Other Ambulatory Visit: Payer: Self-pay

## 2019-04-01 ENCOUNTER — Ambulatory Visit (HOSPITAL_COMMUNITY): Payer: Medicare HMO | Attending: Orthopedic Surgery

## 2019-04-01 ENCOUNTER — Encounter (HOSPITAL_COMMUNITY): Payer: Self-pay

## 2019-04-01 DIAGNOSIS — R29898 Other symptoms and signs involving the musculoskeletal system: Secondary | ICD-10-CM | POA: Diagnosis not present

## 2019-04-01 DIAGNOSIS — M25611 Stiffness of right shoulder, not elsewhere classified: Secondary | ICD-10-CM | POA: Insufficient documentation

## 2019-04-01 DIAGNOSIS — M25511 Pain in right shoulder: Secondary | ICD-10-CM | POA: Insufficient documentation

## 2019-04-01 NOTE — Patient Instructions (Signed)
Perform each exercise ________ reps. 2-3x days.   Protraction - STANDING  Start by holding a wand or cane at chest height.  Next, slowly push the wand outwards in front of your body so that your elbows become fully straightened. Then, return to the original position.     Shoulder FLEXION - STANDING - PALMS UP  In the standing position, hold a wand/cane with both arms, palms up on both sides. Raise up the wand/cane allowing your unaffected arm to perform most of the effort. Your affected arm should be partially relaxed.      Internal/External ROTATION - STANDING  In the standing position, hold a wand/cane with both hands keeping your elbows bent. Move your arms and wand/cane to one side.  Your affected arm should be partially relaxed while your unaffected arm performs most of the effort.       Shoulder ABDUCTION - STANDING  While holding a wand/cane palm face up on the injured side and palm face down on the uninjured side, slowly raise up your injured arm to the side.        Horizontal Abduction/Adduction      Straight arms holding cane at shoulder height, bring cane to right, center, left. Repeat starting to left.   Copyright  VHI. All rights reserved.

## 2019-04-01 NOTE — Therapy (Signed)
[place], [place], [address] Phone: [phone]   Fax:  [phone]  Occupational Therapy Treatment  Patient Details  Name: [name] MRN: [id number] Date of Birth: 1955/06/27 Referring Provider (OT): Dr. Meredith Pel   Encounter Date: 04/01/2019  OT End of Session - 04/01/19 1405    Visit Number  3    Number of Visits  16    Date for OT Re-Evaluation  05/18/19   mini-reassessment 04/15/2018   Authorization Type  [employer] Medicare; $35 copay    Authorization Time Period  progress note by visit 10    Authorization - Visit Number  3    Authorization - Number of Visits  10    OT Start Time  1300    OT Stop Time  1338    OT Time Calculation (min)  38 min    Activity Tolerance  Patient tolerated treatment well    Behavior During Therapy  [facility] for tasks assessed/performed       Past Medical History:  Diagnosis Date  . Arthritis    "fingers occasionally" (08/26/2014)  . Asthmatic bronchitis    "haven't used an inhaler in years" (08/26/2014)  . Cervical cancer ([place])   . Counseling for estrogen replacement therapy 12/30/2014  . Depression   . Diabetes mellitus without complication ([place])    type- 2  . Elevated cholesterol   . Exertional shortness of breath   . Family history of adverse reaction to anesthesia    "I think my mama gets sick"  . GERD (gastroesophageal reflux disease)   . Hot flashes 07/05/2012  . Hypertension   . Hypothyroidism   . IFG (impaired fasting glucose)   . Mixed stress and urge urinary incontinence   . Moody 12/30/2014  . Obesity   . Panniculus   . PONV (postoperative nausea and vomiting)   . Rectocele 07/05/2012  . Stress incontinence   . Superficial fungus infection of skin     Past Surgical History:  Procedure Laterality Date  . ABDOMINAL HYSTERECTOMY  1986   "partial"  . ANKLE FUSION Left 10/24/2013   Procedure: LEFT ANKLE SUBTALAR AND TALONAVICULAR FUSION;  Surgeon: Newt Minion, MD;  Location: [place];  Service: Orthopedics;  Laterality: Left;  . ANKLE FUSION Left 08/26/2014   Procedure: Left Foot Take Down Non-union with Revision Talonavicular and Subtalar Fusion;  Surgeon: Newt Minion, MD;  Location: [place];  Service: Orthopedics;  Laterality: Left;  . BACK SURGERY    . BILATERAL SALPINGECTOMY  2010   w/LOA  . COLONOSCOPY    . COLONOSCOPY N/A 07/14/2016   Procedure: COLONOSCOPY;  Surgeon: Danie Binder, MD;  [facility];  Service: Endoscopy;  Laterality: N/A;  8:30  . FUSION OF TALONAVICULAR JOINT Left    Take Down Non-union with Revision Talonavicular and Subtalar Fusion /notes 08/26/2014  . HAMMER TOE SURGERY Bilateral 2000-2013   right-left  . JOINT REPLACEMENT    . KNEE ARTHROSCOPY Bilateral 2008-2010    left-right  . LUMBAR DISC SURGERY  2004   Lumbar 4- 5  . POLYPECTOMY  07/14/2016   Procedure: POLYPECTOMY;  Surgeon: Danie Binder, MD;  [facility];  Service: Endoscopy;;  hepatic flexure  . SHOULDER ARTHROSCOPY WITH SUBACROMIAL DECOMPRESSION, ROTATOR CUFF REPAIR AND BICEP TENDON REPAIR Right 02/11/2019   Procedure: right shoulde arthroscopy, biceps tenodesis lower trapezius tendon transfer;  Surgeon: Meredith Pel, MD;  Location: [place];  Service: Orthopedics;  Laterality: Right;  . TOTAL KNEE ARTHROPLASTY Right 2013  . TUBAL LIGATION      There were no vitals filed for this visit.  Subjective Assessment - 04/01/19 1315    Subjective   S: I'm reaching better with it.    Currently in Pain?  Yes    Pain Score  2     Pain Location  Shoulder    Pain Orientation  Right    Pain Descriptors / Indicators  Sore    Pain Type  Acute pain    Pain Radiating Towards  N/A    Pain Onset  More than a month ago    Pain Frequency  Constant    Aggravating Factors   Unknown    Pain Relieving Factors  rest and massage    Effect of Pain on Daily Activities  min effect    Multiple Pain Sites  No         [facility] OT Assessment -  04/01/19 1316      Assessment   Medical Diagnosis  s/p right lower trapezius tendon transfer      Precautions   Precautions  Shoulder    Type of Shoulder Precautions  See protocol               OT Treatments/Exercises (OP) - 04/01/19 1317      Exercises   Exercises  Shoulder      Shoulder Exercises: Supine   Protraction  PROM;5 reps;AROM;10 reps    Horizontal ABduction  PROM;5 reps;AAROM;10 reps    External Rotation  PROM;5 reps;AROM;10 reps    Internal Rotation  PROM;5 reps;AROM;10 reps    Flexion  PROM;5 reps;AAROM;10 reps    ABduction  PROM;5 reps;AAROM;10 reps      Shoulder Exercises: Standing   Protraction  AAROM;10 reps    External Rotation  AAROM;10 reps    Internal Rotation  AAROM;10 reps    Flexion  AAROM;10 reps    ABduction  AAROM;10 reps      Shoulder Exercises: Pulleys   Flexion  1 minute   standing   ABduction  1 minute   standing     Shoulder Exercises: ROM/Strengthening   Wall Wash  1'    Other ROM/Strengthening Exercises  PVC pipe slide 10X      Manual Therapy   Manual Therapy  Myofascial release    Manual therapy comments  Manual therapy completed prior to exercises.     Myofascial Release  Myofascial release and manual stretching completed to right upper arm, trapezius, and scapularis region to decrease fascial restrctions and increase joint mobility in a pain free zone.                OT Short Term Goals - 03/25/19 1057      OT SHORT TERM GOAL #1   Title  Pt will be provided with and educated on HEP to improve mobility required for ADL completion using RUE as dominant.    Time  4    Period  Weeks    Status  On-going    Target Date  04/18/19      OT SHORT TERM GOAL #2   Title  Pt will increase P/ROM of RUE to [facility] to improve ability to perform dressing tasks.    Time  4    Period  Weeks    Status  On-going      OT SHORT TERM GOAL #3   Title  Pt will increase  RUE strength to 3/5 to improve ability to perform bathing and  grooming tasks using RUE.    Time  4    Period  Weeks    Status  On-going        OT Long Term Goals - 03/25/19 1057      OT LONG TERM GOAL #1   Title  Pt will return to highest level of functioning using RUE as dominant during ADL and leisure tasks.    Time  8    Period  Weeks    Status  On-going      OT LONG TERM GOAL #2   Title  Pt will decrease pain in RUE to 3/10 or less to improve ability to sleep in her bed for 4 consecutive hours or greater.    Time  8    Period  Weeks    Status  On-going      OT LONG TERM GOAL #3   Title  Pt will increase RUE A/ROM to [place] to improve ability to reach into overhead cabinets.    Time  8    Period  Weeks    Status  On-going      OT LONG TERM GOAL #4   Title  Pt will decrease RUE fascial restrictions to trace amounts to improve mobility required for functional reaching tasks.    Time  8    Period  Weeks    Status  On-going      OT LONG TERM GOAL #5   Title  Pt will increase RUE strength to 4+/5 or greater to improve ability to lift pots and pans during meal preparation tasks.    Time  8    Period  Weeks    Status  On-going            Plan - 04/01/19 1406    Clinical Impression Statement  A: Pt able to complete all AA/ROM standing with the exception of horizontal abduction due to decrease muscle strength. Pt was educated to complete AA/ROM standing now versus supine and omit horizontal abduction. VC for form and technique were provided. Manual techniques completed to address fascial restrictions. Trace fascial restrictions noted this date in the RUE.    Body Structure / Function / Physical Skills  ADL;Endurance;UE functional use;Fascial restriction;Pain;ROM;IADL;Strength    Plan  P: Continue with AA/ROM standing. Work on increasing ROM during AA/ROM.    Consulted and Agree with Plan of Care  Patient       Patient will benefit from skilled therapeutic intervention in order to improve the following deficits and impairments:    Body Structure / Function / Physical Skills: ADL, Endurance, UE functional use, Fascial restriction, Pain, ROM, IADL, Strength       Visit Diagnosis: Stiffness of right shoulder, not elsewhere classified  Other symptoms and signs involving the musculoskeletal system  Acute pain of right shoulder    Problem List Patient Active Problem List   Diagnosis Date Noted  . Upper airway cough syndrome 07/30/2018  . Morbid (severe) obesity due to excess calories ([place]) 07/30/2018  . Urinary, incontinence, stress female 05/15/2018  . Encounter for screening breast examination 05/15/2018  . Depression, major, single episode, moderate ([place]) 10/17/2017  . Hypothyroidism 09/19/2017  . Special screening for malignant neoplasms, colon   . Moody 12/30/2014  . Counseling for estrogen replacement therapy 12/30/2014  . 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 Mixed stress and urge urinary incontinence 02/10/2014  . Diabetes ([place]) 01/18/2014  .  Posterior tibialis muscle dysfunction 10/24/2013  . Hyperlipidemia LDL goal <100 03/10/2013  . Superficial fungus infection of skin 07/05/2012  . Hot flashes 07/05/2012  . Rectocele 07/05/2012  . Essential hypertension 07/04/2012  . Hx of cervical cancer 07/04/2012  . Stiffness of joint, lower leg, left 08/28/2011  . Difficulty in walking(719.7) 08/28/2011  . Pain in left knee 08/28/2011   Ailene Ravel, OTR/L,CBIS  [phone]  04/01/2019, 2:19 PM  [place] [address] [place], [place], [address] Phone: [phone]   Fax:  [phone]  Name: RADIAH DONN MRN: [id number] Date of Birth: 1955-12-21

## 2019-04-03 ENCOUNTER — Ambulatory Visit (HOSPITAL_COMMUNITY): Payer: Medicare HMO | Admitting: Occupational Therapy

## 2019-04-03 ENCOUNTER — Encounter (HOSPITAL_COMMUNITY): Payer: Self-pay | Admitting: Occupational Therapy

## 2019-04-03 ENCOUNTER — Other Ambulatory Visit: Payer: Self-pay

## 2019-04-03 DIAGNOSIS — M25611 Stiffness of right shoulder, not elsewhere classified: Secondary | ICD-10-CM

## 2019-04-03 DIAGNOSIS — R29898 Other symptoms and signs involving the musculoskeletal system: Secondary | ICD-10-CM

## 2019-04-03 DIAGNOSIS — M25511 Pain in right shoulder: Secondary | ICD-10-CM

## 2019-04-03 NOTE — Therapy (Signed)
[place], [place], [address] Phone: [phone]   Fax:  [phone]  Occupational Therapy Treatment  Patient Details  Name: [name] MRN: [id number] Date of Birth: 09-26-1955 Referring Provider (OT): Dr. Meredith Pel   Encounter Date: 04/03/2019  OT End of Session - 04/03/19 1429    Visit Number  4    Number of Visits  16    Date for OT Re-Evaluation  05/18/19   mini-reassessment 04/15/2018   Authorization Type  [employer] Medicare; $35 copay    Authorization Time Period  progress note by visit 10    Authorization - Visit Number  4    Authorization - Number of Visits  10    OT Start Time  1350    OT Stop Time  1428    OT Time Calculation (min)  38 min    Activity Tolerance  Patient tolerated treatment well    Behavior During Therapy  [facility] for tasks assessed/performed       Past Medical History:  Diagnosis Date  . Arthritis    "fingers occasionally" (08/26/2014)  . Asthmatic bronchitis    "haven't used an inhaler in years" (08/26/2014)  . Cervical cancer ([place])   . Counseling for estrogen replacement therapy 12/30/2014  . Depression   . Diabetes mellitus without complication ([place])    type- 2  . Elevated cholesterol   . Exertional shortness of breath   . Family history of adverse reaction to anesthesia    "I think my mama gets sick"  . GERD (gastroesophageal reflux disease)   . Hot flashes 07/05/2012  . Hypertension   . Hypothyroidism   . IFG (impaired fasting glucose)   . Mixed stress and urge urinary incontinence   . Moody 12/30/2014  . Obesity   . Panniculus   . PONV (postoperative nausea and vomiting)   . Rectocele 07/05/2012  . Stress incontinence   . Superficial fungus infection of skin     Past Surgical History:  Procedure Laterality Date  . ABDOMINAL HYSTERECTOMY  1986   "partial"  . ANKLE FUSION Left 10/24/2013   Procedure: LEFT ANKLE SUBTALAR AND TALONAVICULAR FUSION;  Surgeon: Newt Minion, MD;  Location: [place];  Service: Orthopedics;  Laterality: Left;  . ANKLE FUSION Left 08/26/2014   Procedure: Left Foot Take Down Non-union with Revision Talonavicular and Subtalar Fusion;  Surgeon: Newt Minion, MD;  Location: [place];  Service: Orthopedics;  Laterality: Left;  . BACK SURGERY    . BILATERAL SALPINGECTOMY  2010   w/LOA  . COLONOSCOPY    . COLONOSCOPY N/A 07/14/2016   Procedure: COLONOSCOPY;  Surgeon: Danie Binder, MD;  [facility];  Service: Endoscopy;  Laterality: N/A;  8:30  . FUSION OF TALONAVICULAR JOINT Left    Take Down Non-union with Revision Talonavicular and Subtalar Fusion /notes 08/26/2014  . HAMMER TOE SURGERY Bilateral 2000-2013   right-left  . JOINT REPLACEMENT    . KNEE ARTHROSCOPY Bilateral 2008-2010    left-right  . LUMBAR DISC SURGERY  2004   Lumbar 4- 5  . POLYPECTOMY  07/14/2016   Procedure: POLYPECTOMY;  Surgeon: Danie Binder, MD;  [facility];  Service: Endoscopy;;  hepatic flexure  . SHOULDER ARTHROSCOPY WITH SUBACROMIAL DECOMPRESSION, ROTATOR CUFF REPAIR AND BICEP TENDON REPAIR Right 02/11/2019   Procedure: right shoulde arthroscopy, biceps tenodesis lower trapezius tendon transfer;  Surgeon: Meredith Pel, MD;  Location: [place];  Service: Orthopedics;  Laterality: Right;  . TOTAL KNEE ARTHROPLASTY Right 2013  . TUBAL LIGATION      There were no vitals filed for this visit.  Subjective Assessment - 04/03/19 1352    Subjective   S: It was really sore after last session.    Currently in Pain?  Yes    Pain Score  2     Pain Location  Shoulder    Pain Orientation  Right    Pain Descriptors / Indicators  Sore    Pain Type  Acute pain    Pain Radiating Towards  N/A    Pain Onset  More than a month ago    Pain Frequency  Constant    Aggravating Factors   unknown    Pain Relieving Factors  rest and massage    Effect of Pain on Daily Activities  min effect    Multiple Pain Sites  No         [facility] OT  Assessment - 04/03/19 1352      Assessment   Medical Diagnosis  s/p right lower trapezius tendon transfer      Precautions   Precautions  Shoulder    Type of Shoulder Precautions  See protocol               OT Treatments/Exercises (OP) - 04/03/19 1353      Exercises   Exercises  Shoulder      Shoulder Exercises: Supine   Protraction  PROM;5 reps;AROM;10 reps    Horizontal ABduction  PROM;5 reps;AAROM;10 reps    External Rotation  PROM;5 reps;AROM;10 reps    Internal Rotation  PROM;5 reps;AROM;10 reps    Flexion  PROM;5 reps;AAROM;10 reps    ABduction  PROM;5 reps;AAROM;10 reps      Shoulder Exercises: Standing   Protraction  AAROM;10 reps    External Rotation  AAROM;10 reps    Internal Rotation  AAROM;10 reps    Flexion  AAROM;10 reps    ABduction  AAROM;10 reps      Shoulder Exercises: Pulleys   Flexion  2 minutes   standing   ABduction  2 minutes   standing     Shoulder Exercises: ROM/Strengthening   Wall Wash  1'    Prot/Ret//Elev/Dep  1'    Other ROM/Strengthening Exercises  PVC pipe slide 10X      Manual Therapy   Manual Therapy  Myofascial release    Manual therapy comments  Manual therapy completed prior to exercises.     Myofascial Release  Myofascial release and manual stretching completed to right upper arm, trapezius, and scapularis region to decrease fascial restrctions and increase joint mobility in a pain free zone.                OT Short Term Goals - 03/25/19 1057      OT SHORT TERM GOAL #1   Title  Pt will be provided with and educated on HEP to improve mobility required for ADL completion using RUE as dominant.    Time  4    Period  Weeks    Status  On-going    Target Date  04/18/19      OT SHORT TERM GOAL #2   Title  Pt will increase P/ROM of RUE to [facility] to improve ability to perform dressing tasks.    Time  4    Period  Weeks    Status  On-going      OT SHORT TERM GOAL #  3   Title  Pt will increase RUE strength to 3/5  to improve ability to perform bathing and grooming tasks using RUE.    Time  4    Period  Weeks    Status  On-going        OT Long Term Goals - 03/25/19 1057      OT LONG TERM GOAL #1   Title  Pt will return to highest level of functioning using RUE as dominant during ADL and leisure tasks.    Time  8    Period  Weeks    Status  On-going      OT LONG TERM GOAL #2   Title  Pt will decrease pain in RUE to 3/10 or less to improve ability to sleep in her bed for 4 consecutive hours or greater.    Time  8    Period  Weeks    Status  On-going      OT LONG TERM GOAL #3   Title  Pt will increase RUE A/ROM to [facility] to improve ability to reach into overhead cabinets.    Time  8    Period  Weeks    Status  On-going      OT LONG TERM GOAL #4   Title  Pt will decrease RUE fascial restrictions to trace amounts to improve mobility required for functional reaching tasks.    Time  8    Period  Weeks    Status  On-going      OT LONG TERM GOAL #5   Title  Pt will increase RUE strength to 4+/5 or greater to improve ability to lift pots and pans during meal preparation tasks.    Time  8    Period  Weeks    Status  On-going            Plan - 04/03/19 1429    Clinical Impression Statement  A: Continued with manual therapy this session, pt with more soreness at anterior shoulder regions today. Continued with A/ROM and AA/ROM in supine, AA/ROM in standing. Added prot/ret/elev/dep and increased pulleys to 2' today. Verbal cuing required for form and technique during exercises.    Body Structure / Function / Physical Skills  ADL;Endurance;UE functional use;Fascial restriction;Pain;ROM;IADL;Strength    Plan  P: continue with AA/ROM while in standing, increase repetitions to 12, attempt pulleys in sitting       Patient will benefit from skilled therapeutic intervention in order to improve the following deficits and impairments:   Body Structure / Function / Physical Skills: ADL, Endurance, UE  functional use, Fascial restriction, Pain, ROM, IADL, Strength       Visit Diagnosis: Stiffness of right shoulder, not elsewhere classified  Other symptoms and signs involving the musculoskeletal system  Acute pain of right shoulder    Problem List Patient Active Problem List   Diagnosis Date Noted  . Upper airway cough syndrome 07/30/2018  . Morbid (severe) obesity due to excess calories ([place]) 07/30/2018  . Urinary, incontinence, stress female 05/15/2018  . Encounter for screening breast examination 05/15/2018  . Depression, major, single episode, moderate ([place]) 10/17/2017  . Hypothyroidism 09/19/2017  . Special screening for malignant neoplasms, colon   . Moody 12/30/2014  . Counseling for estrogen replacement therapy 12/30/2014  . 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 Mixed stress and urge urinary incontinence 02/10/2014  . Diabetes ([place]) 01/18/2014  . Posterior tibialis muscle dysfunction 10/24/2013  . Hyperlipidemia LDL goal <100 03/10/2013  .  Superficial fungus infection of skin 07/05/2012  . Hot flashes 07/05/2012  . Rectocele 07/05/2012  . Essential hypertension 07/04/2012  . Hx of cervical cancer 07/04/2012  . Stiffness of joint, lower leg, left 08/28/2011  . Difficulty in walking(719.7) 08/28/2011  . Pain in left knee 08/28/2011   Guadelupe Sabin, OTR/L  [phone] 04/03/2019, 2:32 PM  [place] [facility] [address] [place], [place], [address] Phone: [phone]   Fax:  [phone]  Name: [name] MRN: [id number] Date of Birth: January 05, 1956

## 2019-04-07 ENCOUNTER — Other Ambulatory Visit: Payer: Self-pay

## 2019-04-07 ENCOUNTER — Encounter (HOSPITAL_COMMUNITY): Payer: Self-pay | Admitting: Occupational Therapy

## 2019-04-07 ENCOUNTER — Telehealth (HOSPITAL_COMMUNITY): Payer: Self-pay

## 2019-04-07 ENCOUNTER — Ambulatory Visit (HOSPITAL_COMMUNITY): Payer: Medicare HMO | Admitting: Occupational Therapy

## 2019-04-07 DIAGNOSIS — R29898 Other symptoms and signs involving the musculoskeletal system: Secondary | ICD-10-CM

## 2019-04-07 DIAGNOSIS — M25611 Stiffness of right shoulder, not elsewhere classified: Secondary | ICD-10-CM | POA: Diagnosis not present

## 2019-04-07 DIAGNOSIS — M25511 Pain in right shoulder: Secondary | ICD-10-CM

## 2019-04-07 NOTE — Therapy (Signed)
[place], [place], [address] Phone: [phone]   Fax:  [phone]  Occupational Therapy Treatment  Patient Details  Name: [name] MRN: [id number] Date of Birth: 1955-08-09 Referring Provider (OT): Dr. Meredith Pel   Encounter Date: 04/07/2019  OT End of Session - 04/07/19 1431    Visit Number  5    Number of Visits  16    Date for OT Re-Evaluation  05/18/19   mini-reassessment 04/15/2018   Authorization Type  [employer] Medicare; $35 copay    Authorization Time Period  progress note by visit 10    Authorization - Visit Number  5    Authorization - Number of Visits  10    OT Start Time  1350    OT Stop Time  1428    OT Time Calculation (min)  38 min    Activity Tolerance  Patient tolerated treatment well    Behavior During Therapy  [facility] for tasks assessed/performed       Past Medical History:  Diagnosis Date  . Arthritis    "fingers occasionally" (08/26/2014)  . Asthmatic bronchitis    "haven't used an inhaler in years" (08/26/2014)  . Cervical cancer ([place])   . Counseling for estrogen replacement therapy 12/30/2014  . Depression   . Diabetes mellitus without complication ([place])    type- 2  . Elevated cholesterol   . Exertional shortness of breath   . Family history of adverse reaction to anesthesia    "I think my mama gets sick"  . GERD (gastroesophageal reflux disease)   . Hot flashes 07/05/2012  . Hypertension   . Hypothyroidism   . IFG (impaired fasting glucose)   . Mixed stress and urge urinary incontinence   . Moody 12/30/2014  . Obesity   . Panniculus   . PONV (postoperative nausea and vomiting)   . Rectocele 07/05/2012  . Stress incontinence   . Superficial fungus infection of skin     Past Surgical History:  Procedure Laterality Date  . ABDOMINAL HYSTERECTOMY  1986   "partial"  . ANKLE FUSION Left 10/24/2013   Procedure: LEFT ANKLE SUBTALAR AND TALONAVICULAR FUSION;  Surgeon: Newt Minion, MD;  Location: [place];  Service: Orthopedics;  Laterality: Left;  . ANKLE FUSION Left 08/26/2014   Procedure: Left Foot Take Down Non-union with Revision Talonavicular and Subtalar Fusion;  Surgeon: Newt Minion, MD;  Location: [facility];  Service: Orthopedics;  Laterality: Left;  . BACK SURGERY    . BILATERAL SALPINGECTOMY  2010   w/LOA  . COLONOSCOPY    . COLONOSCOPY N/A 07/14/2016   Procedure: COLONOSCOPY;  Surgeon: Danie Binder, MD;  [facility];  Service: Endoscopy;  Laterality: N/A;  8:30  . FUSION OF TALONAVICULAR JOINT Left    Take Down Non-union with Revision Talonavicular and Subtalar Fusion /notes 08/26/2014  . HAMMER TOE SURGERY Bilateral 2000-2013   right-left  . JOINT REPLACEMENT    . KNEE ARTHROSCOPY Bilateral 2008-2010    left-right  . LUMBAR DISC SURGERY  2004   Lumbar 4- 5  . POLYPECTOMY  07/14/2016   Procedure: POLYPECTOMY;  Surgeon: Danie Binder, MD;  [facility];  Service: Endoscopy;;  hepatic flexure  . SHOULDER ARTHROSCOPY WITH SUBACROMIAL DECOMPRESSION, ROTATOR CUFF REPAIR AND BICEP TENDON REPAIR Right 02/11/2019   Procedure: right shoulde arthroscopy, biceps tenodesis lower trapezius tendon transfer;  Surgeon: Meredith Pel, MD;  Location: [place];  Service: Orthopedics;  Laterality: Right;  . TOTAL KNEE ARTHROPLASTY Right 2013  . TUBAL LIGATION      There were no vitals filed for this visit.  Subjective Assessment - 04/07/19 1351    Subjective   S: It's a little sore on the top of my shoulder.    Currently in Pain?  Yes    Pain Score  3     Pain Location  Shoulder    Pain Orientation  Right    Pain Descriptors / Indicators  Aching;Sore    Pain Type  Acute pain    Pain Radiating Towards  N/A    Pain Onset  More than a month ago    Pain Frequency  Intermittent    Aggravating Factors   unknown    Pain Relieving Factors  rest and massage    Effect of Pain on Daily Activities  min effect on ADLs    Multiple Pain Sites  No          [facility] OT Assessment - 04/07/19 1350      Assessment   Medical Diagnosis  s/p right lower trapezius tendon transfer      Precautions   Precautions  Shoulder    Type of Shoulder Precautions  See protocol               OT Treatments/Exercises (OP) - 04/07/19 1353      Exercises   Exercises  Shoulder      Shoulder Exercises: Supine   Protraction  PROM;5 reps;AROM;12 reps    Horizontal ABduction  PROM;5 reps;AROM;10 reps    External Rotation  PROM;5 reps;AROM;12 reps    Internal Rotation  PROM;5 reps;AROM;12 reps    Flexion  PROM;5 reps;AROM;10 reps    ABduction  PROM;5 reps;AROM;10 reps      Shoulder Exercises: Standing   Protraction  AAROM;12 reps    External Rotation  AROM;10 reps    Internal Rotation  AROM;10 reps    Flexion  AAROM;12 reps    ABduction  AAROM;12 reps      Shoulder Exercises: Pulleys   Flexion  2 minutes    ABduction  2 minutes      Shoulder Exercises: ROM/Strengthening   Wall Wash  1'    Over Head Lace  1' seated    Proximal Shoulder Strengthening, Supine  10X each, no rest breaks    Proximal Shoulder Strengthening, Seated  10X each, no rest breaks    Other ROM/Strengthening Exercises  PVC pipe slide 15X               OT Short Term Goals - 03/25/19 1057      OT SHORT TERM GOAL #1   Title  Pt will be provided with and educated on HEP to improve mobility required for ADL completion using RUE as dominant.    Time  4    Period  Weeks    Status  On-going    Target Date  04/18/19      OT SHORT TERM GOAL #2   Title  Pt will increase P/ROM of RUE to [facility] to improve ability to perform dressing tasks.    Time  4    Period  Weeks    Status  On-going      OT SHORT TERM GOAL #3   Title  Pt will increase RUE strength to 3/5 to improve ability to perform bathing and grooming tasks using RUE.    Time  4  Period  Weeks    Status  On-going        OT Long Term Goals - 03/25/19 1057      OT LONG TERM GOAL #1   Title  Pt  will return to highest level of functioning using RUE as dominant during ADL and leisure tasks.    Time  8    Period  Weeks    Status  On-going      OT LONG TERM GOAL #2   Title  Pt will decrease pain in RUE to 3/10 or less to improve ability to sleep in her bed for 4 consecutive hours or greater.    Time  8    Period  Weeks    Status  On-going      OT LONG TERM GOAL #3   Title  Pt will increase RUE A/ROM to [facility] to improve ability to reach into overhead cabinets.    Time  8    Period  Weeks    Status  On-going      OT LONG TERM GOAL #4   Title  Pt will decrease RUE fascial restrictions to trace amounts to improve mobility required for functional reaching tasks.    Time  8    Period  Weeks    Status  On-going      OT LONG TERM GOAL #5   Title  Pt will increase RUE strength to 4+/5 or greater to improve ability to lift pots and pans during meal preparation tasks.    Time  8    Period  Weeks    Status  On-going            Plan - 04/07/19 1432    Clinical Impression Statement  A: Pt with min fascial restrictions in shoulder and scapular regions today. Completed all A/ROM in supine, progress to A/ROM er/IR in standing and continued with AA/ROM. Added proximal shoulder strengthening in supine and standing as well as overhead lacing. Pt completed pulleys in sitting. Verbal cuing for form and technique, pt with no pain during session.    Body Structure / Function / Physical Skills  ADL;Endurance;UE functional use;Fascial restriction;Pain;ROM;IADL;Strength    Plan  P: Continue with AA/ROM and A/ROM, add functional reaching task       Patient will benefit from skilled therapeutic intervention in order to improve the following deficits and impairments:   Body Structure / Function / Physical Skills: ADL, Endurance, UE functional use, Fascial restriction, Pain, ROM, IADL, Strength       Visit Diagnosis: Other symptoms and signs involving the musculoskeletal system  Stiffness of  right shoulder, not elsewhere classified  Acute pain of right shoulder    Problem List Patient Active Problem List   Diagnosis Date Noted  . Upper airway cough syndrome 07/30/2018  . Morbid (severe) obesity due to excess calories ([place]) 07/30/2018  . Urinary, incontinence, stress female 05/15/2018  . Encounter for screening breast examination 05/15/2018  . Depression, major, single episode, moderate ([place]) 10/17/2017  . Hypothyroidism 09/19/2017  . Special screening for malignant neoplasms, colon   . Moody 12/30/2014  . Counseling for estrogen replacement therapy 12/30/2014  . 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 Mixed stress and urge urinary incontinence 02/10/2014  . Diabetes ([place]) 01/18/2014  . Posterior tibialis muscle dysfunction 10/24/2013  . Hyperlipidemia LDL goal <100 03/10/2013  . Superficial fungus infection of skin 07/05/2012  . Hot flashes 07/05/2012  . Rectocele 07/05/2012  . Essential hypertension 07/04/2012  .  Hx of cervical cancer 07/04/2012  . Stiffness of joint, lower leg, left 08/28/2011  . Difficulty in walking(719.7) 08/28/2011  . Pain in left knee 08/28/2011   Guadelupe Sabin, OTR/L  [phone] 04/07/2019, 2:48 PM  [place] [address] [place], [place], [address] Phone: [phone]   Fax:  [phone]  Name: [name] MRN: [id number] Date of Birth: [birth year]

## 2019-04-07 NOTE — Telephone Encounter (Signed)
L/m to let [name] know that her 1:45pm with LE has been cx - LE will not be in the office on Thursday

## 2019-04-09 ENCOUNTER — Ambulatory Visit (INDEPENDENT_AMBULATORY_CARE_PROVIDER_SITE_OTHER): Payer: Medicare HMO | Admitting: Orthopedic Surgery

## 2019-04-09 ENCOUNTER — Encounter: Payer: Self-pay | Admitting: Orthopedic Surgery

## 2019-04-09 ENCOUNTER — Other Ambulatory Visit: Payer: Self-pay

## 2019-04-09 VITALS — Ht 63.0 in | Wt 238.0 lb

## 2019-04-09 DIAGNOSIS — S46011D Strain of muscle(s) and tendon(s) of the rotator cuff of right shoulder, subsequent encounter: Secondary | ICD-10-CM

## 2019-04-09 NOTE — Progress Notes (Signed)
Post-Op Visit Note   Patient: [name]           Date of Birth: 1955-10-07           MRN: [id number] Visit Date: 04/09/2019 PCP: Mikey Kirschner, MD   Assessment & Plan:  Chief Complaint:  Chief Complaint  Patient presents with  . Right Shoulder - Routine Post Op    02/11/19 right lower trapezius tendon transfer    Visit Diagnoses: No diagnosis found.  Plan: [name] is now about 2 months out right shoulder lower trapezius tendon transfer.  Shoulder is doing well.  On exam she has forward flexion abduction above 90 degrees with very good external rotation strength.  She reporting some decreased grip strength that may be slowly improving.  The hand is really the only problem at this time.  On exam she does have a little bit of diminished grip strength on the right compared to the left.  Negative Tinel's cubital tunnel.  Plan at this time is observation because I think she may have a little bit of nerve stretch problems potentially from being in that brace for 6 weeks.  8-week return and continue therapy to work on strengthening.  May also add occupational therapy after the next visit if she is not improving.  Follow-Up Instructions: Return in about 8 weeks (around 06/04/2019).   Orders:  No orders of the defined types were placed in this encounter.  No orders of the defined types were placed in this encounter.   Imaging: No results found.  PMFS History: Patient Active Problem List   Diagnosis Date Noted  . Upper airway cough syndrome 07/30/2018  . Morbid (severe) obesity due to excess calories ([place]) 07/30/2018  . Urinary, incontinence, stress female 05/15/2018  . Encounter for screening breast examination 05/15/2018  . Depression, major, single episode, moderate ([place]) 10/17/2017  . Hypothyroidism 09/19/2017  . Special screening for malignant neoplasms, colon   . Moody 12/30/2014  . Counseling for estrogen replacement therapy 12/30/2014  . Fibrosis of subtalar joint  08/26/2014  . Mixed stress and urge urinary incontinence 02/10/2014  . Diabetes ([place]) 01/18/2014  . Posterior tibialis muscle dysfunction 10/24/2013  . Hyperlipidemia LDL goal <100 03/10/2013  . Superficial fungus infection of skin 07/05/2012  . Hot flashes 07/05/2012  . Rectocele 07/05/2012  . Essential hypertension 07/04/2012  . Hx of cervical cancer 07/04/2012  . Stiffness of joint, lower leg, left 08/28/2011  . Difficulty in walking(719.7) 08/28/2011  . Pain in left knee 08/28/2011   Past Medical History:  Diagnosis Date  . Arthritis    "fingers occasionally" (08/26/2014)  . Asthmatic bronchitis    "haven't used an inhaler in years" (08/26/2014)  . Cervical cancer ([place])   . Counseling for estrogen replacement therapy 12/30/2014  . Depression   . Diabetes mellitus without complication ([place])    type- 2  . Elevated cholesterol   . Exertional shortness of breath   . Family history of adverse reaction to anesthesia    "I think my mama gets sick"  . GERD (gastroesophageal reflux disease)   . Hot flashes 07/05/2012  . Hypertension   . Hypothyroidism   . IFG (impaired fasting glucose)   . Mixed stress and urge urinary incontinence   . Moody 12/30/2014  . Obesity   . Panniculus   . PONV (postoperative nausea and vomiting)   . Rectocele 07/05/2012  . Stress incontinence   . Superficial fungus infection of skin     Family  History  Problem Relation Age of Onset  . Heart attack Father   . Stroke Mother   . Diabetes Brother   . Hypertension Brother   . Diabetes Brother   . Hypertension Brother   . Diabetes Maternal Grandmother   . Cancer Maternal Aunt        cervical  . Diabetes Paternal Grandmother     Past Surgical History:  Procedure Laterality Date  . ABDOMINAL HYSTERECTOMY  1986   "partial"  . ANKLE FUSION Left 10/24/2013   Procedure: LEFT ANKLE SUBTALAR AND TALONAVICULAR FUSION;  Surgeon: Newt Minion, MD;  Location: [place];  Service: Orthopedics;  Laterality: Left;    . ANKLE FUSION Left 08/26/2014   Procedure: Left Foot Take Down Non-union with Revision Talonavicular and Subtalar Fusion;  Surgeon: Newt Minion, MD;  Location: [facility];  Service: Orthopedics;  Laterality: Left;  . BACK SURGERY    . BILATERAL SALPINGECTOMY  2010   w/LOA  . COLONOSCOPY    . COLONOSCOPY N/A 07/14/2016   Procedure: COLONOSCOPY;  Surgeon: Danie Binder, MD;  [facility];  Service: Endoscopy;  Laterality: N/A;  8:30  . FUSION OF TALONAVICULAR JOINT Left    Take Down Non-union with Revision Talonavicular and Subtalar Fusion /notes 08/26/2014  . HAMMER TOE SURGERY Bilateral 2000-2013   right-left  . JOINT REPLACEMENT    . KNEE ARTHROSCOPY Bilateral 2008-2010    left-right  . LUMBAR DISC SURGERY  2004   Lumbar 4- 5  . POLYPECTOMY  07/14/2016   Procedure: POLYPECTOMY;  Surgeon: Danie Binder, MD;  [facility];  Service: Endoscopy;;  hepatic flexure  . SHOULDER ARTHROSCOPY WITH SUBACROMIAL DECOMPRESSION, ROTATOR CUFF REPAIR AND BICEP TENDON REPAIR Right 02/11/2019   Procedure: right shoulde arthroscopy, biceps tenodesis lower trapezius tendon transfer;  Surgeon: Meredith Pel, MD;  Location: [facility];  Service: Orthopedics;  Laterality: Right;  . TOTAL KNEE ARTHROPLASTY Right 2013  . TUBAL LIGATION     Social History   Occupational History  . Not on file  Tobacco Use  . Smoking status: Former Smoker    Packs/day: 0.50    Years: 10.00    Pack years: 5.00    Types: Cigarettes    Quit date: 03/27/1988    Years since quitting: 31.0  . Smokeless tobacco: Never Used  Substance and Sexual Activity  . Alcohol use: Yes    Comment: occasionally a beer  . Drug use: No  . Sexual activity: Not Currently    Birth control/protection: Surgical    Comment: hyst

## 2019-04-10 ENCOUNTER — Ambulatory Visit (HOSPITAL_COMMUNITY): Payer: Medicare HMO

## 2019-04-11 ENCOUNTER — Other Ambulatory Visit: Payer: Self-pay | Admitting: *Deleted

## 2019-04-11 ENCOUNTER — Other Ambulatory Visit: Payer: Self-pay | Admitting: Family Medicine

## 2019-04-11 MED ORDER — THYROID 30 MG PO TABS: 60.0000 mg | ORAL_TABLET | Freq: Every day | ORAL | 0 refills | Status: DC

## 2019-04-14 ENCOUNTER — Encounter (HOSPITAL_COMMUNITY): Payer: Medicare HMO

## 2019-04-15 ENCOUNTER — Ambulatory Visit (HOSPITAL_COMMUNITY): Payer: Medicare HMO | Admitting: Occupational Therapy

## 2019-04-15 ENCOUNTER — Encounter (HOSPITAL_COMMUNITY): Payer: Self-pay | Admitting: Occupational Therapy

## 2019-04-15 ENCOUNTER — Other Ambulatory Visit: Payer: Self-pay

## 2019-04-15 DIAGNOSIS — M25511 Pain in right shoulder: Secondary | ICD-10-CM | POA: Diagnosis not present

## 2019-04-15 DIAGNOSIS — R29898 Other symptoms and signs involving the musculoskeletal system: Secondary | ICD-10-CM | POA: Diagnosis not present

## 2019-04-15 DIAGNOSIS — M25611 Stiffness of right shoulder, not elsewhere classified: Secondary | ICD-10-CM | POA: Diagnosis not present

## 2019-04-15 NOTE — Therapy (Signed)
[name], [place], [address] Phone: [phone]   Fax:  [phone]  Occupational Therapy Treatment  Patient Details  Name: [name] MRN: [id number] Date of Birth: 22-Oct-1955 Referring Provider (OT): Dr. Meredith Pel   Encounter Date: 04/15/2019  OT End of Session - 04/15/19 1726    Visit Number  6    Number of Visits  16    Date for OT Re-Evaluation  05/18/19   mini-reassessment 04/15/2018   Authorization Type  [employer] Medicare; $35 copay    Authorization Time Period  progress note by visit 10    Authorization - Visit Number  6    Authorization - Number of Visits  10    OT Start Time  1645    OT Stop Time  1723    OT Time Calculation (min)  38 min    Activity Tolerance  Patient tolerated treatment well    Behavior During Therapy  [facility] for tasks assessed/performed       Past Medical History:  Diagnosis Date  . Arthritis    "fingers occasionally" (08/26/2014)  . Asthmatic bronchitis    "haven't used an inhaler in years" (08/26/2014)  . Cervical cancer ([place])   . Counseling for estrogen replacement therapy 12/30/2014  . Depression   . Diabetes mellitus without complication ([place])    type- 2  . Elevated cholesterol   . Exertional shortness of breath   . Family history of adverse reaction to anesthesia    "I think my mama gets sick"  . GERD (gastroesophageal reflux disease)   . Hot flashes 07/05/2012  . Hypertension   . Hypothyroidism   . IFG (impaired fasting glucose)   . Mixed stress and urge urinary incontinence   . Moody 12/30/2014  . Obesity   . Panniculus   . PONV (postoperative nausea and vomiting)   . Rectocele 07/05/2012  . Stress incontinence   . Superficial fungus infection of skin     Past Surgical History:  Procedure Laterality Date  . ABDOMINAL HYSTERECTOMY  1986   "partial"  . ANKLE FUSION Left 10/24/2013   Procedure: LEFT ANKLE SUBTALAR AND TALONAVICULAR FUSION;  Surgeon: Newt Minion, MD;  Location: [place];  Service: Orthopedics;  Laterality: Left;  . ANKLE FUSION Left 08/26/2014   Procedure: Left Foot Take Down Non-union with Revision Talonavicular and Subtalar Fusion;  Surgeon: Newt Minion, MD;  Location: [facility];  Service: Orthopedics;  Laterality: Left;  . BACK SURGERY    . BILATERAL SALPINGECTOMY  2010   w/LOA  . COLONOSCOPY    . COLONOSCOPY N/A 07/14/2016   Procedure: COLONOSCOPY;  Surgeon: Angelica Binder, MD;  [facility];  Service: Endoscopy;  Laterality: N/A;  8:30  . FUSION OF TALONAVICULAR JOINT Left    Take Down Non-union with Revision Talonavicular and Subtalar Fusion /notes 08/26/2014  . HAMMER TOE SURGERY Bilateral 2000-2013   right-left  . JOINT REPLACEMENT    . KNEE ARTHROSCOPY Bilateral 2008-2010    left-right  . LUMBAR DISC SURGERY  2004   Lumbar 4- 5  . POLYPECTOMY  07/14/2016   Procedure: POLYPECTOMY;  Surgeon: Angelica Binder, MD;  [facility];  Service: Endoscopy;;  hepatic flexure  . SHOULDER ARTHROSCOPY WITH SUBACROMIAL DECOMPRESSION, ROTATOR CUFF REPAIR AND BICEP TENDON REPAIR Right 02/11/2019   Procedure: right shoulde arthroscopy, biceps tenodesis lower trapezius tendon transfer;  Surgeon: Meredith Pel, MD;  Location: [place];  Service: Orthopedics;  Laterality: Right;  . TOTAL KNEE ARTHROPLASTY Right 2013  . TUBAL LIGATION      There were no vitals filed for this visit.  Subjective Assessment - 04/15/19 1647    Subjective   S: I've been trying to reach more without shrugging my shoulder.    Currently in Pain?  No/denies         [facility] OT Assessment - 04/15/19 1647      Assessment   Medical Diagnosis  s/p right lower trapezius tendon transfer      Precautions   Precautions  Shoulder    Type of Shoulder Precautions  See protocol               OT Treatments/Exercises (OP) - 04/15/19 1647      Exercises   Exercises  Shoulder      Shoulder Exercises: Supine   Protraction  PROM;5  reps;AROM;12 reps    Horizontal ABduction  PROM;5 reps;AROM;12 reps    External Rotation  PROM;5 reps;AROM;12 reps    Internal Rotation  PROM;5 reps;AROM;12 reps    Flexion  PROM;5 reps;AROM;12 reps    ABduction  PROM;5 reps;AROM;12 reps      Shoulder Exercises: Standing   Protraction  AROM;10 reps    Horizontal ABduction  AROM;10 reps    External Rotation  AROM;10 reps    Internal Rotation  AROM;10 reps    Flexion  AROM;10 reps    ABduction  AAROM;10 reps    Extension  Theraband;10 reps    Theraband Level (Shoulder Extension)  Level 2 (Red)    Row  Theraband;10 reps    Theraband Level (Shoulder Row)  Level 2 (Red)    Retraction  Theraband;10 reps    Theraband Level (Shoulder Retraction)  Level 2 (Red)      Shoulder Exercises: Therapy Ball   Right/Left  5 reps   each direction     Shoulder Exercises: ROM/Strengthening   Over Head Lace  1' seated    Proximal Shoulder Strengthening, Supine  10X each, no rest breaks    Proximal Shoulder Strengthening, Seated  10X each, no rest breaks    Other ROM/Strengthening Exercises  proximal shoulder strengthening on doorway, 30" flexion      Functional Reaching Activities   Mid Level  Pt placed and removed 10 cones from middle shelf of overhead cabinet, mod difficulty, cuing to keep feet flat on floor.                OT Short Term Goals - 03/25/19 1057      OT SHORT TERM GOAL #1   Title  Pt will be provided with and educated on HEP to improve mobility required for ADL completion using RUE as dominant.    Time  4    Period  Weeks    Status  On-going    Target Date  04/18/19      OT SHORT TERM GOAL #2   Title  Pt will increase P/ROM of RUE to [facility] to improve ability to perform dressing tasks.    Time  4    Period  Weeks    Status  On-going      OT SHORT TERM GOAL #3   Title  Pt will increase RUE strength to 3/5 to improve ability to perform bathing and grooming tasks using RUE.    Time  4    Period  Weeks    Status   On-going  OT Long Term Goals - 03/25/19 1057      OT LONG TERM GOAL #1   Title  Pt will return to highest level of functioning using RUE as dominant during ADL and leisure tasks.    Time  8    Period  Weeks    Status  On-going      OT LONG TERM GOAL #2   Title  Pt will decrease pain in RUE to 3/10 or less to improve ability to sleep in her bed for 4 consecutive hours or greater.    Time  8    Period  Weeks    Status  On-going      OT LONG TERM GOAL #3   Title  Pt will increase RUE A/ROM to [facility] to improve ability to reach into overhead cabinets.    Time  8    Period  Weeks    Status  On-going      OT LONG TERM GOAL #4   Title  Pt will decrease RUE fascial restrictions to trace amounts to improve mobility required for functional reaching tasks.    Time  8    Period  Weeks    Status  On-going      OT LONG TERM GOAL #5   Title  Pt will increase RUE strength to 4+/5 or greater to improve ability to lift pots and pans during meal preparation tasks.    Time  8    Period  Weeks    Status  On-going            Plan - 04/15/19 1704    Clinical Impression Statement  A: Pt reports she has been using her arm a lot, rearranging her room and running around today. No fascial restrictions palpated this session, pt with P/ROM [facility]. Continued with A/ROM in supine and progressed in standing with exception of abduction. Added scapular theraband, functional reaching, and ball circles this session. Verbal cuing for form and technique.    Body Structure / Function / Physical Skills  ADL;Endurance;UE functional use;Fascial restriction;Pain;ROM;IADL;Strength    Plan  P: Continue with functional reaching tasks, work on improving IR       Patient will benefit from skilled therapeutic intervention in order to improve the following deficits and impairments:   Body Structure / Function / Physical Skills: ADL, Endurance, UE functional use, Fascial restriction, Pain, ROM, IADL, Strength        Visit Diagnosis: Other symptoms and signs involving the musculoskeletal system  Stiffness of right shoulder, not elsewhere classified  Acute pain of right shoulder    Problem List Patient Active Problem List   Diagnosis Date Noted  . Upper airway cough syndrome 07/30/2018  . Morbid (severe) obesity due to excess calories ([place]) 07/30/2018  . Urinary, incontinence, stress female 05/15/2018  . Encounter for screening breast examination 05/15/2018  . Depression, major, single episode, moderate ([place]) 10/17/2017  . Hypothyroidism 09/19/2017  . Special screening for malignant neoplasms, colon   . Moody 12/30/2014  . Counseling for estrogen replacement therapy 12/30/2014  . 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 Mixed stress and urge urinary incontinence 02/10/2014  . Diabetes ([place]) 01/18/2014  . Posterior tibialis muscle dysfunction 10/24/2013  . Hyperlipidemia LDL goal <100 03/10/2013  . Superficial fungus infection of skin 07/05/2012  . Hot flashes 07/05/2012  . Rectocele 07/05/2012  . Essential hypertension 07/04/2012  . Hx of cervical cancer 07/04/2012  . Stiffness of joint, lower leg, left 08/28/2011  . Difficulty  in walking(719.7) 08/28/2011  . Pain in left knee 08/28/2011   Guadelupe Sabin, OTR/L  [phone] 04/15/2019, 5:29 PM  [facility] [address] [place], [place], [address] Phone: [phone]   Fax:  [phone]  Name: DELVIA ASANO MRN: [id number] Date of Birth: Mar 06, 1956

## 2019-04-17 ENCOUNTER — Ambulatory Visit (HOSPITAL_COMMUNITY): Payer: Medicare HMO | Admitting: Occupational Therapy

## 2019-04-17 ENCOUNTER — Other Ambulatory Visit: Payer: Self-pay

## 2019-04-17 ENCOUNTER — Encounter (HOSPITAL_COMMUNITY): Payer: Self-pay | Admitting: Occupational Therapy

## 2019-04-17 DIAGNOSIS — M25511 Pain in right shoulder: Secondary | ICD-10-CM | POA: Diagnosis not present

## 2019-04-17 DIAGNOSIS — R29898 Other symptoms and signs involving the musculoskeletal system: Secondary | ICD-10-CM

## 2019-04-17 DIAGNOSIS — M25611 Stiffness of right shoulder, not elsewhere classified: Secondary | ICD-10-CM

## 2019-04-17 NOTE — Therapy (Signed)
[place], [place], [address] Phone: [phone]   Fax:  [phone]  Occupational Therapy Treatment  Patient Details  Name: [name] MRN: [id number] Date of Birth: [birth year] Referring Provider (OT): Dr. Meredith Pel   Encounter Date: 04/17/2019  OT End of Session - 04/17/19 1430    Visit Number  7    Number of Visits  16    Date for OT Re-Evaluation  05/18/19     Authorization Type  [employer] Medicare; $35 copay    Authorization Time Period  progress note by visit 10    Authorization - Visit Number  7    Authorization - Number of Visits  10    OT Start Time  7209    OT Stop Time  1427    OT Time Calculation (min)  42 min    Activity Tolerance  Patient tolerated treatment well    Behavior During Therapy  [facility] for tasks assessed/performed       Past Medical History:  Diagnosis Date  . Arthritis    "fingers occasionally" (08/26/2014)  . Asthmatic bronchitis    "haven't used an inhaler in years" (08/26/2014)  . Cervical cancer ([place])   . Counseling for estrogen replacement therapy 12/30/2014  . Depression   . Diabetes mellitus without complication ([place])    type- 2  . Elevated cholesterol   . Exertional shortness of breath   . Family history of adverse reaction to anesthesia    "I think my mama gets sick"  . GERD (gastroesophageal reflux disease)   . Hot flashes 07/05/2012  . Hypertension   . Hypothyroidism   . IFG (impaired fasting glucose)   . Mixed stress and urge urinary incontinence   . Moody 12/30/2014  . Obesity   . Panniculus   . PONV (postoperative nausea and vomiting)   . Rectocele 07/05/2012  . Stress incontinence   . Superficial fungus infection of skin     Past Surgical History:  Procedure Laterality Date  . ABDOMINAL HYSTERECTOMY  1986   "partial"  . ANKLE FUSION Left 10/24/2013   Procedure: LEFT ANKLE SUBTALAR AND TALONAVICULAR FUSION;  Surgeon: Newt Minion, MD;  Location: [place];   Service: Orthopedics;  Laterality: Left;  . ANKLE FUSION Left 08/26/2014   Procedure: Left Foot Take Down Non-union with Revision Talonavicular and Subtalar Fusion;  Surgeon: Newt Minion, MD;  Location: [place];  Service: Orthopedics;  Laterality: Left;  . BACK SURGERY    . BILATERAL SALPINGECTOMY  2010   w/LOA  . COLONOSCOPY    . COLONOSCOPY N/A 07/14/2016   Procedure: COLONOSCOPY;  Surgeon: Danie Binder, MD;  [facility];  Service: Endoscopy;  Laterality: N/A;  8:30  . FUSION OF TALONAVICULAR JOINT Left    Take Down Non-union with Revision Talonavicular and Subtalar Fusion /notes 08/26/2014  . HAMMER TOE SURGERY Bilateral 2000-2013   right-left  . JOINT REPLACEMENT    . KNEE ARTHROSCOPY Bilateral 2008-2010    left-right  . LUMBAR DISC SURGERY  2004   Lumbar 4- 5  . POLYPECTOMY  07/14/2016   Procedure: POLYPECTOMY;  Surgeon: Danie Binder, MD;  [facility];  Service: Endoscopy;;  hepatic flexure  . SHOULDER ARTHROSCOPY WITH SUBACROMIAL DECOMPRESSION, ROTATOR CUFF REPAIR AND BICEP TENDON REPAIR Right 02/11/2019   Procedure: right shoulde arthroscopy, biceps tenodesis lower trapezius tendon transfer;  Surgeon: Meredith Pel, MD;  Location: [place];  Service:  Orthopedics;  Laterality: Right;  . TOTAL KNEE ARTHROPLASTY Right 2013  . TUBAL LIGATION      There were no vitals filed for this visit.  Subjective Assessment - 04/17/19 1344    Subjective   S: I just can't reach my ceiling fan to clean it.    Currently in Pain?  No/denies         [facility] OT Assessment - 04/17/19 1344      Assessment   Medical Diagnosis  s/p right lower trapezius tendon transfer      Precautions   Precautions  Shoulder    Type of Shoulder Precautions  See protocol      Observation/Other Assessments   Focus on Therapeutic Outcomes (FOTO)   60/100   41/100 previous     Palpation   Palpation comment  Trace fascial restrictions in upper arm and trapeizus regions      AROM    Overall AROM Comments  Assessed seated, er/IR adducted    AROM Assessment Site  Shoulder    Right/Left Shoulder  Right    Right Shoulder Flexion  150 Degrees   45 previous   Right Shoulder ABduction  150 Degrees   45 previous   Right Shoulder Internal Rotation  30 Degrees   adducted baseline    Right Shoulder External Rotation  68 Degrees   33 previous     PROM   Overall PROM Comments  Assessed supine, er/IR abducted    PROM Assessment Site  Shoulder    Right/Left Shoulder  Right    Right Shoulder Flexion  154 Degrees   112 previous   Right Shoulder ABduction  150 Degrees   110 previous   Right Shoulder Internal Rotation  75 Degrees   51 previous   Right Shoulder External Rotation  80 Degrees   52 previous     Strength   Overall Strength Comments  Assessed seated, er/IR adducted    Strength Assessment Site  Shoulder    Right/Left Shoulder  Right    Right Shoulder Flexion  3/5   not previously assessed   Right Shoulder ABduction  3/5   not previously assessed   Right Shoulder Internal Rotation  4/5   not previously assessed   Right Shoulder External Rotation  3/5   not previously assessed              OT Treatments/Exercises (OP) - 04/17/19 1403      Exercises   Exercises  Shoulder      Shoulder Exercises: Supine   Protraction  PROM;5 reps    Horizontal ABduction  PROM;5 reps    External Rotation  PROM;5 reps    Internal Rotation  PROM;5 reps    Flexion  PROM;5 reps    ABduction  PROM;5 reps      Shoulder Exercises: Standing   Protraction  AROM;10 reps    Horizontal ABduction  AROM;10 reps    External Rotation  AROM;10 reps    Internal Rotation  AROM;10 reps    Flexion  AROM;10 reps    ABduction  AAROM;10 reps    Extension  Theraband;10 reps    Theraband Level (Shoulder Extension)  Level 2 (Red)    Row  Theraband;10 reps    Theraband Level (Shoulder Row)  Level 2 (Red)    Retraction  Theraband;10 reps    Theraband Level (Shoulder Retraction)   Level 2 (Red)      Shoulder Exercises: ROM/Strengthening   Wall Wash  1'  Over Head Lace  2' seated    Proximal Shoulder Strengthening, Seated  10X each, no rest breaks    Other ROM/Strengthening Exercises  ball pass behind back for IR and behind head for er, 10X each    Other ROM/Strengthening Exercises  proximal shoulder strengthening on doorway, 30" flexion      Shoulder Exercises: Stretch   Internal Rotation Stretch  3 reps   10" holds     Functional Reaching Activities   Mid Level  Pt placed and removed 10 cones from middle shelf of overhead cabinet, mod difficulty, cuing to keep feet flat on floor.              OT Education - 04/17/19 1408    Education Details  A/ROM exercises    Person(s) Educated  Patient    Methods  Explanation;Demonstration;Verbal cues;Handout    Comprehension  Verbalized understanding;Returned demonstration       OT Short Term Goals - 04/17/19 1402      OT SHORT TERM GOAL #1   Title  Pt will be provided with and educated on HEP to improve mobility required for ADL completion using RUE as dominant.    Time  4    Period  Weeks    Status  On-going    Target Date  04/18/19      OT SHORT TERM GOAL #2   Title  Pt will increase P/ROM of RUE to [facility] to improve ability to perform dressing tasks.    Time  4    Period  Weeks    Status  Achieved      OT SHORT TERM GOAL #3   Title  Pt will increase RUE strength to 3/5 to improve ability to perform bathing and grooming tasks using RUE.    Time  4    Period  Weeks    Status  Achieved        OT Long Term Goals - 04/17/19 1402      OT LONG TERM GOAL #1   Title  Pt will return to highest level of functioning using RUE as dominant during ADL and leisure tasks.    Time  8    Period  Weeks    Status  On-going      OT LONG TERM GOAL #2   Title  Pt will decrease pain in RUE to 3/10 or less to improve ability to sleep in her bed for 4 consecutive hours or greater.    Time  8    Period  Weeks     Status  Achieved      OT LONG TERM GOAL #3   Title  Pt will increase RUE A/ROM to [facility] to improve ability to reach into overhead cabinets.    Time  8    Period  Weeks    Status  Achieved      OT LONG TERM GOAL #4   Title  Pt will decrease RUE fascial restrictions to trace amounts to improve mobility required for functional reaching tasks.    Time  8    Period  Weeks    Status  Achieved      OT LONG TERM GOAL #5   Title  Pt will increase RUE strength to 4+/5 or greater to improve ability to lift pots and pans during meal preparation tasks.    Time  8    Period  Weeks    Status  On-going  Plan - 04/17/19 1408    Clinical Impression Statement  A: Pt reports she is using her RUE for all ADLs and is able to wash dishes without taking a break now. Pt reports she can sleep on the arm for about 1 hour before waking up now. Mini-reassessment completed this session, pt has made great progress and has met 2/3 STGs and 3/5 LTGs thus far during therapy. Discussed progress with pt who requests to decrease to 1x/week due to high copay. Schedule adjusted to accomodate new frequency. Continued with A/ROM and functional reaching this session. Added IR and er exercises and IR stretch to improve ability to perform dressing tasks. Continued with scapular theraband. Verbal cuing for form and technique.    Body Structure / Function / Physical Skills  ADL;Endurance;UE functional use;Fascial restriction;Pain;ROM;IADL;Strength    Plan  P: Continue with protocol, functional reaching tasks, continue working to improve IR required for donning bra       Patient will benefit from skilled therapeutic intervention in order to improve the following deficits and impairments:   Body Structure / Function / Physical Skills: ADL, Endurance, UE functional use, Fascial restriction, Pain, ROM, IADL, Strength       Visit Diagnosis: Other symptoms and signs involving the musculoskeletal system  Stiffness of  right shoulder, not elsewhere classified  Acute pain of right shoulder    Problem List Patient Active Problem List   Diagnosis Date Noted  . Upper airway cough syndrome 07/30/2018  . Morbid (severe) obesity due to excess calories ([place]) 07/30/2018  . Urinary, incontinence, stress female 05/15/2018  . Encounter for screening breast examination 05/15/2018  . Depression, major, single episode, moderate ([place]) 10/17/2017  . Hypothyroidism 09/19/2017  . Special screening for malignant neoplasms, colon   . Moody 12/30/2014  . Counseling for estrogen replacement therapy 12/30/2014  . 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 Mixed stress and urge urinary incontinence 02/10/2014  . Diabetes ([place]) 01/18/2014  . Posterior tibialis muscle dysfunction 10/24/2013  . Hyperlipidemia LDL goal <100 03/10/2013  . Superficial fungus infection of skin 07/05/2012  . Hot flashes 07/05/2012  . Rectocele 07/05/2012  . Essential hypertension 07/04/2012  . Hx of cervical cancer 07/04/2012  . Stiffness of joint, lower leg, left 08/28/2011  . Difficulty in walking(719.7) 08/28/2011  . Pain in left knee 08/28/2011   Guadelupe Sabin, OTR/L  [phone] 04/17/2019, 2:31 PM  [place], [place], [address] Phone: [phone]   Fax:  [phone]  Name: [name] MRN: [id number] Date of Birth: 09-27-1955

## 2019-04-17 NOTE — Patient Instructions (Signed)

## 2019-04-21 ENCOUNTER — Ambulatory Visit (HOSPITAL_COMMUNITY): Payer: Medicare HMO

## 2019-04-21 ENCOUNTER — Telehealth (HOSPITAL_COMMUNITY): Payer: Self-pay

## 2019-04-21 NOTE — Telephone Encounter (Signed)
She has to take care of some business today and can not be here.

## 2019-04-24 ENCOUNTER — Encounter (HOSPITAL_COMMUNITY): Payer: Medicare HMO | Admitting: Occupational Therapy

## 2019-04-28 ENCOUNTER — Other Ambulatory Visit: Payer: Self-pay

## 2019-04-28 ENCOUNTER — Encounter (HOSPITAL_COMMUNITY): Payer: Self-pay

## 2019-04-28 ENCOUNTER — Ambulatory Visit (HOSPITAL_COMMUNITY): Payer: Medicare HMO | Attending: Orthopedic Surgery

## 2019-04-28 DIAGNOSIS — M25511 Pain in right shoulder: Secondary | ICD-10-CM

## 2019-04-28 DIAGNOSIS — R29898 Other symptoms and signs involving the musculoskeletal system: Secondary | ICD-10-CM | POA: Diagnosis not present

## 2019-04-28 DIAGNOSIS — M25611 Stiffness of right shoulder, not elsewhere classified: Secondary | ICD-10-CM

## 2019-04-28 NOTE — Therapy (Signed)
[place], [place], [address] Phone: [phone]   Fax:  [phone]  Occupational Therapy Treatment  Patient Details  Name: [name] MRN: [id number] Date of Birth: 1955/11/21 Referring Provider (OT): Dr. Meredith Pel   Encounter Date: 04/28/2019  OT End of Session - 04/28/19 1502    Visit Number  8    Number of Visits  16    Date for OT Re-Evaluation  05/18/19    Authorization Type  [employer] Medicare; $35 copay    Authorization Time Period  progress note by visit 10    Authorization - Visit Number  8    Authorization - Number of Visits  10    OT Start Time  1300    OT Stop Time  1338    OT Time Calculation (min)  38 min    Activity Tolerance  Patient tolerated treatment well    Behavior During Therapy  [facility] for tasks assessed/performed       Past Medical History:  Diagnosis Date  . Arthritis    "fingers occasionally" (08/26/2014)  . Asthmatic bronchitis    "haven't used an inhaler in years" (08/26/2014)  . Cervical cancer ([place])   . Counseling for estrogen replacement therapy 12/30/2014  . Depression   . Diabetes mellitus without complication ([place])    type- 2  . Elevated cholesterol   . Exertional shortness of breath   . Family history of adverse reaction to anesthesia    "I think my mama gets sick"  . GERD (gastroesophageal reflux disease)   . Hot flashes 07/05/2012  . Hypertension   . Hypothyroidism   . IFG (impaired fasting glucose)   . Mixed stress and urge urinary incontinence   . Moody 12/30/2014  . Obesity   . Panniculus   . PONV (postoperative nausea and vomiting)   . Rectocele 07/05/2012  . Stress incontinence   . Superficial fungus infection of skin     Past Surgical History:  Procedure Laterality Date  . ABDOMINAL HYSTERECTOMY  1986   "partial"  . ANKLE FUSION Left 10/24/2013   Procedure: LEFT ANKLE SUBTALAR AND TALONAVICULAR FUSION;  Surgeon: Newt Minion, MD;  Location: [place];   Service: Orthopedics;  Laterality: Left;  . ANKLE FUSION Left 08/26/2014   Procedure: Left Foot Take Down Non-union with Revision Talonavicular and Subtalar Fusion;  Surgeon: Newt Minion, MD;  Location: [place];  Service: Orthopedics;  Laterality: Left;  . BACK SURGERY    . BILATERAL SALPINGECTOMY  2010   w/LOA  . COLONOSCOPY    . COLONOSCOPY N/A 07/14/2016   Procedure: COLONOSCOPY;  Surgeon: Danie Binder, MD;  [facility];  Service: Endoscopy;  Laterality: N/A;  8:30  . FUSION OF TALONAVICULAR JOINT Left    Take Down Non-union with Revision Talonavicular and Subtalar Fusion /notes 08/26/2014  . HAMMER TOE SURGERY Bilateral 2000-2013   right-left  . JOINT REPLACEMENT    . KNEE ARTHROSCOPY Bilateral 2008-2010    left-right  . LUMBAR DISC SURGERY  2004   Lumbar 4- 5  . POLYPECTOMY  07/14/2016   Procedure: POLYPECTOMY;  Surgeon: Danie Binder, MD;  [facility];  Service: Endoscopy;;  hepatic flexure  . SHOULDER ARTHROSCOPY WITH SUBACROMIAL DECOMPRESSION, ROTATOR CUFF REPAIR AND BICEP TENDON REPAIR Right 02/11/2019   Procedure: right shoulde arthroscopy, biceps tenodesis lower trapezius tendon transfer;  Surgeon: Meredith Pel, MD;  Location: [place];  Service: Orthopedics;  Laterality: Right;  . TOTAL KNEE ARTHROPLASTY Right 2013  . TUBAL LIGATION      There were no vitals filed for this visit.  Subjective Assessment - 04/28/19 1315    Subjective   S: It only hurts when I'm sleeping.    Currently in Pain?  No/denies         [facility] OT Assessment - 04/28/19 1316      Assessment   Medical Diagnosis  s/p right lower trapezius tendon transfer      Precautions   Precautions  Shoulder    Type of Shoulder Precautions  See protocol               OT Treatments/Exercises (OP) - 04/28/19 1316      Exercises   Exercises  Shoulder      Shoulder Exercises: Supine   Protraction  PROM;5 reps    Horizontal ABduction  PROM;5 reps    External Rotation   PROM;5 reps    Internal Rotation  PROM;5 reps    Flexion  PROM;5 reps    ABduction  PROM;5 reps      Shoulder Exercises: Standing   Protraction  AROM;12 reps    Horizontal ABduction  AROM;12 reps    External Rotation  AROM;12 reps    Internal Rotation  AROM;12 reps    Flexion  AROM;12 reps    ABduction  AROM;10 reps;Limitations    ABduction Limitations  to shoulder level    Extension  Theraband;10 reps    Theraband Level (Shoulder Extension)  Level 2 (Red)      Shoulder Exercises: ROM/Strengthening   Wall Wash  1'    Over Head Lace  2' seated    Proximal Shoulder Strengthening, Seated  12X each, no rest breaks    Other ROM/Strengthening Exercises  ball pass behind back for IR and behind head for er, 10X each    Other ROM/Strengthening Exercises  proximal shoulder strengthening on doorway, 1' flexion      Functional Reaching Activities   Mid Level  10 cones placed and removed from 2nd shelf. Once completed in flexion and once completed in abduction.    High Level  Finger ladder completed 5X up/down while patient was able to reach number 14 each time.             OT Education - 04/28/19 1339    Education Details  Next session add red scapular theraband exercises - out of thereband at time of visit    Person(s) Educated  --    Methods  --    Comprehension  --       OT Short Term Goals - 04/28/19 1509      OT SHORT TERM GOAL #1   Title  Pt will be provided with and educated on HEP to improve mobility required for ADL completion using RUE as dominant.    Time  4    Period  Weeks    Status  On-going    Target Date  04/18/19      OT SHORT TERM GOAL #2   Title  Pt will increase P/ROM of RUE to [facility] to improve ability to perform dressing tasks.    Time  4    Period  Weeks      OT SHORT TERM GOAL #3   Title  Pt will increase RUE strength to 3/5 to improve ability to perform bathing and grooming tasks using RUE.    Time  4  Period  Weeks        OT Long Term  Goals - 04/28/19 1509      OT LONG TERM GOAL #1   Title  Pt will return to highest level of functioning using RUE as dominant during ADL and leisure tasks.    Time  8    Period  Weeks    Status  On-going      OT LONG TERM GOAL #2   Title  Pt will decrease pain in RUE to 3/10 or less to improve ability to sleep in her bed for 4 consecutive hours or greater.    Time  8    Period  Weeks      OT LONG TERM GOAL #3   Title  Pt will increase RUE A/ROM to [facility] to improve ability to reach into overhead cabinets.    Time  8    Period  Weeks      OT LONG TERM GOAL #4   Title  Pt will decrease RUE fascial restrictions to trace amounts to improve mobility required for functional reaching tasks.    Time  8    Period  Weeks      OT LONG TERM GOAL #5   Title  Pt will increase RUE strength to 4+/5 or greater to improve ability to lift pots and pans during meal preparation tasks.    Time  8    Period  Weeks    Status  On-going            Plan - 04/28/19 1503    Clinical Impression Statement  A: Continued with following protocol while completing A/ROM shoulder exercises standing. VC were given during session for form and technique. Unable to provided red theraband for scapular exercises as we are out. Will order for next session. Patient has close to full passive ROM and demonstrated more limitations with ROM against gravity. Decreased shoulder endurance noted during session while requiring cueing to keep form.    Body Structure / Function / Physical Skills  ADL;Endurance;UE functional use;Fascial restriction;Pain;ROM;IADL;Strength    Plan  P: Next phase of protocol- strengthening. May begin to progress as able to tolerate, Red scapular theraband for HEP.    Consulted and Agree with Plan of Care  Patient       Patient will benefit from skilled therapeutic intervention in order to improve the following deficits and impairments:   Body Structure / Function / Physical Skills: ADL, Endurance, UE  functional use, Fascial restriction, Pain, ROM, IADL, Strength       Visit Diagnosis: Other symptoms and signs involving the musculoskeletal system  Stiffness of right shoulder, not elsewhere classified  Acute pain of right shoulder    Problem List Patient Active Problem List   Diagnosis Date Noted  . Upper airway cough syndrome 07/30/2018  . Morbid (severe) obesity due to excess calories ([place]) 07/30/2018  . Urinary, incontinence, stress female 05/15/2018  . Encounter for screening breast examination 05/15/2018  . Depression, major, single episode, moderate ([place]) 10/17/2017  . Hypothyroidism 09/19/2017  . Special screening for malignant neoplasms, colon   . Moody 12/30/2014  . Counseling for estrogen replacement therapy 12/30/2014  . 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 Mixed stress and urge urinary incontinence 02/10/2014  . Diabetes ([place]) 01/18/2014  . Posterior tibialis muscle dysfunction 10/24/2013  . Hyperlipidemia LDL goal <100 03/10/2013  . Superficial fungus infection of skin 07/05/2012  . Hot flashes 07/05/2012  . Rectocele 07/05/2012  . Essential  hypertension 07/04/2012  . Hx of cervical cancer 07/04/2012  . Stiffness of joint, lower leg, left 08/28/2011  . Difficulty in walking(719.7) 08/28/2011  . Pain in left knee 08/28/2011   Ailene Ravel, OTR/L,CBIS  [phone]  04/28/2019, 3:09 PM  [place] [address] [place], [place], [address] Phone: [phone]   Fax:  [phone]  Name: CHASSIDY SHORTALL MRN: [id number] Date of Birth: 24-Jan-1956

## 2019-05-01 ENCOUNTER — Encounter: Payer: Self-pay | Admitting: Family Medicine

## 2019-05-01 ENCOUNTER — Encounter (HOSPITAL_COMMUNITY): Payer: Medicare HMO | Admitting: Occupational Therapy

## 2019-05-05 ENCOUNTER — Encounter (HOSPITAL_COMMUNITY): Payer: Self-pay

## 2019-05-05 ENCOUNTER — Other Ambulatory Visit: Payer: Self-pay

## 2019-05-05 ENCOUNTER — Ambulatory Visit (HOSPITAL_COMMUNITY): Payer: Medicare HMO

## 2019-05-05 DIAGNOSIS — R29898 Other symptoms and signs involving the musculoskeletal system: Secondary | ICD-10-CM

## 2019-05-05 DIAGNOSIS — M25611 Stiffness of right shoulder, not elsewhere classified: Secondary | ICD-10-CM

## 2019-05-05 DIAGNOSIS — M25511 Pain in right shoulder: Secondary | ICD-10-CM

## 2019-05-05 NOTE — Therapy (Signed)
[place], [place], [address] Phone: [phone]   Fax:  [phone]  Occupational Therapy Treatment  Patient Details  Name: [name] MRN: [id number] Date of Birth: 1955-08-15 Referring Provider (OT): Dr. Meredith Pel   Encounter Date: 05/05/2019  OT End of Session - 05/05/19 1505    Visit Number  9    Number of Visits  16    Date for OT Re-Evaluation  05/18/19    Authorization Type  [employer]; $35 copay    Authorization Time Period  progress note by visit 10    Authorization - Visit Number  9    Authorization - Number of Visits  10    OT Start Time  1350    OT Stop Time  1423    OT Time Calculation (min)  33 min (Pt arrived late)   Activity Tolerance  Patient tolerated treatment well    Behavior During Therapy  [facility] for tasks assessed/performed       Past Medical History:  Diagnosis Date  . Arthritis    "fingers occasionally" (08/26/2014)  . Asthmatic bronchitis    "haven't used an inhaler in years" (08/26/2014)  . Cervical cancer ([place])   . Counseling for estrogen replacement therapy 12/30/2014  . Depression   . Diabetes mellitus without complication ([place])    type- 2  . Elevated cholesterol   . Exertional shortness of breath   . Family history of adverse reaction to anesthesia    "I think my mama gets sick"  . GERD (gastroesophageal reflux disease)   . Hot flashes 07/05/2012  . Hypertension   . Hypothyroidism   . IFG (impaired fasting glucose)   . Mixed stress and urge urinary incontinence   . Moody 12/30/2014  . Obesity   . Panniculus   . PONV (postoperative nausea and vomiting)   . Rectocele 07/05/2012  . Stress incontinence   . Superficial fungus infection of skin     Past Surgical History:  Procedure Laterality Date  . ABDOMINAL HYSTERECTOMY  1986   "partial"  . ANKLE FUSION Left 10/24/2013   Procedure: LEFT ANKLE SUBTALAR AND TALONAVICULAR FUSION;  Surgeon: Newt Minion, MD;   Location: [place];  Service: Orthopedics;  Laterality: Left;  . ANKLE FUSION Left 08/26/2014   Procedure: Left Foot Take Down Non-union with Revision Talonavicular and Subtalar Fusion;  Surgeon: Newt Minion, MD;  Location: [place];  Service: Orthopedics;  Laterality: Left;  . BACK SURGERY    . BILATERAL SALPINGECTOMY  2010   w/LOA  . COLONOSCOPY    . COLONOSCOPY N/A 07/14/2016   Procedure: COLONOSCOPY;  Surgeon: Danie Binder, MD;  [facility];  Service: Endoscopy;  Laterality: N/A;  8:30  . FUSION OF TALONAVICULAR JOINT Left    Take Down Non-union with Revision Talonavicular and Subtalar Fusion /notes 08/26/2014  . HAMMER TOE SURGERY Bilateral 2000-2013   right-left  . JOINT REPLACEMENT    . KNEE ARTHROSCOPY Bilateral 2008-2010    left-right  . LUMBAR DISC SURGERY  2004   Lumbar 4- 5  . POLYPECTOMY  07/14/2016   Procedure: POLYPECTOMY;  Surgeon: Danie Binder, MD;  [facility];  Service: Endoscopy;;  hepatic flexure  . SHOULDER ARTHROSCOPY WITH SUBACROMIAL DECOMPRESSION, ROTATOR CUFF REPAIR AND BICEP TENDON REPAIR Right 02/11/2019   Procedure: right shoulde arthroscopy, biceps tenodesis lower trapezius tendon transfer;  Surgeon: Meredith Pel, MD;  Location: [place];  Service: Orthopedics;  Laterality: Right;  . TOTAL KNEE ARTHROPLASTY Right 2013  . TUBAL LIGATION      There were no vitals filed for this visit.  Subjective Assessment - 05/05/19 1401    Subjective   S: It only hurts when I'm sleeping. My hand woke me up. I feel tired when I get woken up at night.    Currently in Pain?  No/denies         [facility] OT Assessment - 05/05/19 1402      Assessment   Medical Diagnosis  s/p right lower trapezius tendon transfer      Precautions   Precautions  Shoulder    Type of Shoulder Precautions  See protocol               OT Treatments/Exercises (OP) - 05/05/19 1402      Exercises   Exercises  Shoulder      Shoulder Exercises: Supine    Protraction  PROM;5 reps;Strengthening;12 reps    Protraction Weight (lbs)  1    Horizontal ABduction  PROM;5 reps;Strengthening;12 reps    Horizontal ABduction Weight (lbs)  1    External Rotation  PROM;5 reps;Strengthening;12 reps    External Rotation Weight (lbs)  1    Internal Rotation  PROM;5 reps;Strengthening;12 reps    Internal Rotation Weight (lbs)  1    Flexion  PROM;5 reps;Strengthening;12 reps    Shoulder Flexion Weight (lbs)  1    ABduction  PROM;5 reps;Strengthening;12 reps    Shoulder ABduction Weight (lbs)  1      Shoulder Exercises: Standing   Protraction  AROM;15 reps    Horizontal ABduction  AROM;15 reps    External Rotation  AROM;15 reps    Internal Rotation  AROM;15 reps    Flexion  AROM;15 reps    ABduction  AROM;10 reps      Shoulder Exercises: ROM/Strengthening   Over Head Lace  2' seated    Proximal Shoulder Strengthening, Supine  12X each; 1#, no rest breaks    Proximal Shoulder Strengthening, Seated  15X each, no rest breaks               OT Short Term Goals - 04/28/19 1509      OT SHORT TERM GOAL #1   Title  Pt will be provided with and educated on HEP to improve mobility required for ADL completion using RUE as dominant.    Time  4    Period  Weeks    Status  On-going    Target Date  04/18/19      OT SHORT TERM GOAL #2   Title  Pt will increase P/ROM of RUE to [facility] to improve ability to perform dressing tasks.    Time  4    Period  Weeks      OT SHORT TERM GOAL #3   Title  Pt will increase RUE strength to 3/5 to improve ability to perform bathing and grooming tasks using RUE.    Time  4    Period  Weeks        OT Long Term Goals - 04/28/19 1509      OT LONG TERM GOAL #1   Title  Pt will return to highest level of functioning using RUE as dominant during ADL and leisure tasks.    Time  8    Period  Weeks    Status  On-going      OT LONG TERM GOAL #2  Title  Pt will decrease pain in RUE to 3/10 or less to improve ability to  sleep in her bed for 4 consecutive hours or greater.    Time  8    Period  Weeks      OT LONG TERM GOAL #3   Title  Pt will increase RUE A/ROM to [facility] to improve ability to reach into overhead cabinets.    Time  8    Period  Weeks      OT LONG TERM GOAL #4   Title  Pt will decrease RUE fascial restrictions to trace amounts to improve mobility required for functional reaching tasks.    Time  8    Period  Weeks      OT LONG TERM GOAL #5   Title  Pt will increase RUE strength to 4+/5 or greater to improve ability to lift pots and pans during meal preparation tasks.    Time  8    Period  Weeks    Status  On-going            Plan - 05/05/19 1505    Clinical Impression Statement  A: Progressed to supine strengthening with 1# hand weight. Unable to complete standing with good form and technique due to muscle weakness. VC for form and technique were provided during session.    Body Structure / Function / Physical Skills  ADL;Endurance;UE functional use;Fascial restriction;Pain;ROM;IADL;Strength    Plan  P: Complete 10th visit progress note/reassessment. Pt wishes to continue for strengthening. Complete FOTO. Add UBE bike. Continue with light weight strengthening supine and progress to standing only if form and technique are correct. If red theraband has been delivered provide patient with scapular strengthening for HEP.    Consulted and Agree with Plan of Care  Patient       Patient will benefit from skilled therapeutic intervention in order to improve the following deficits and impairments:   Body Structure / Function / Physical Skills: ADL, Endurance, UE functional use, Fascial restriction, Pain, ROM, IADL, Strength       Visit Diagnosis: Stiffness of right shoulder, not elsewhere classified  Other symptoms and signs involving the musculoskeletal system  Acute pain of right shoulder    Problem List Patient Active Problem List   Diagnosis Date Noted  . Upper airway cough  syndrome 07/30/2018  . Morbid (severe) obesity due to excess calories ([place]) 07/30/2018  . Urinary, incontinence, stress female 05/15/2018  . Encounter for screening breast examination 05/15/2018  . Depression, major, single episode, moderate ([place]) 10/17/2017  . Hypothyroidism 09/19/2017  . Special screening for malignant neoplasms, colon   . Moody 12/30/2014  . Counseling for estrogen replacement therapy 12/30/2014  . 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 Mixed stress and urge urinary incontinence 02/10/2014  . Diabetes ([place]) 01/18/2014  . Posterior tibialis muscle dysfunction 10/24/2013  . Hyperlipidemia LDL goal <100 03/10/2013  . Superficial fungus infection of skin 07/05/2012  . Hot flashes 07/05/2012  . Rectocele 07/05/2012  . Essential hypertension 07/04/2012  . Hx of cervical cancer 07/04/2012  . Stiffness of joint, lower leg, left 08/28/2011  . Difficulty in walking(719.7) 08/28/2011  . Pain in left knee 08/28/2011   Ailene Ravel, OTR/L,CBIS  [phone]  05/05/2019, 3:08 PM  [facility] [address] [place], [place], [address] Phone: [phone]   Fax:  [phone]  Name: ORLENE GURSKY MRN: [id number] Date of Birth: 13-Feb-1956

## 2019-05-08 ENCOUNTER — Encounter (HOSPITAL_COMMUNITY): Payer: Medicare HMO | Admitting: Occupational Therapy

## 2019-05-13 ENCOUNTER — Telehealth (HOSPITAL_COMMUNITY): Payer: Self-pay | Admitting: Occupational Therapy

## 2019-05-13 ENCOUNTER — Ambulatory Visit (HOSPITAL_COMMUNITY): Payer: Medicare HMO | Admitting: Occupational Therapy

## 2019-05-13 NOTE — Telephone Encounter (Signed)
pt is not feeling well so she cancelled for today

## 2019-05-14 ENCOUNTER — Other Ambulatory Visit: Payer: Self-pay | Admitting: Family Medicine

## 2019-05-15 ENCOUNTER — Encounter (HOSPITAL_COMMUNITY): Payer: Medicare HMO | Admitting: Occupational Therapy

## 2019-05-19 ENCOUNTER — Encounter (HOSPITAL_COMMUNITY): Payer: Self-pay

## 2019-05-19 ENCOUNTER — Ambulatory Visit (HOSPITAL_COMMUNITY): Payer: Medicare HMO

## 2019-05-19 ENCOUNTER — Telehealth (HOSPITAL_COMMUNITY): Payer: Self-pay

## 2019-05-19 NOTE — Telephone Encounter (Signed)
Pt called today and requested to be d/c she has improved and wants to continue HEP at home

## 2019-05-19 NOTE — Therapy (Signed)
[place], [place], [address] Phone: [phone]   Fax:  [phone]  Patient Details  Name: [name] MRN: [id number] Date of Birth: [birth year] Referring Provider:  No ref. provider found  Encounter Date: 05/19/2019  OCCUPATIONAL THERAPY DISCHARGE SUMMARY  Visits from Start of Care: 9  Current functional level related to goals / functional outcomes: OT LONG TERM GOAL #1    Title  Pt will return to highest level of functioning using RUE as dominant during ADL and leisure tasks.    Time  8    Period  Weeks    Status  On-going        OT LONG TERM GOAL #2   Title  Pt will decrease pain in RUE to 3/10 or less to improve ability to sleep in her bed for 4 consecutive hours or greater.    Time  8    Period  Weeks        OT LONG TERM GOAL #3   Title  Pt will increase RUE A/ROM to [facility] to improve ability to reach into overhead cabinets.    Time  8    Period  Weeks        OT LONG TERM GOAL #4   Title  Pt will decrease RUE fascial restrictions to trace amounts to improve mobility required for functional reaching tasks.    Time  8    Period  Weeks        OT LONG TERM GOAL #5   Title  Pt will increase RUE strength to 4+/5 or greater to improve ability to lift pots and pans during meal preparation tasks.    Time  8    Period  Weeks    Status  On-going    At last treatment session on 05/05/19, patient had met 3/5 long term goals. Unable to assess patient's strength and ROM as she called and requested to be discharged. She will continue her HEP at home.    Remaining deficits: Unknown. At last visit, patient displayed decreased strength and reported limitations during daily tasks.    Education / Equipment: Shoulder HEP Plan: Patient agrees to discharge.  Patient goals were partially met. Patient is being discharged due to the patient's request.  ?????          Ailene Ravel,  OTR/L,CBIS  [phone]  05/19/2019, 10:11 AM  [place], [place], [address] Phone: [phone]   Fax:  [phone]

## 2019-05-26 ENCOUNTER — Ambulatory Visit (HOSPITAL_COMMUNITY): Payer: Medicare HMO

## 2019-05-27 ENCOUNTER — Encounter (INDEPENDENT_AMBULATORY_CARE_PROVIDER_SITE_OTHER): Payer: Self-pay

## 2019-06-02 ENCOUNTER — Encounter (HOSPITAL_COMMUNITY): Payer: Medicare HMO

## 2019-06-04 ENCOUNTER — Ambulatory Visit (INDEPENDENT_AMBULATORY_CARE_PROVIDER_SITE_OTHER): Payer: Medicare HMO | Admitting: Orthopedic Surgery

## 2019-06-04 ENCOUNTER — Encounter: Payer: Self-pay | Admitting: Orthopedic Surgery

## 2019-06-04 ENCOUNTER — Other Ambulatory Visit: Payer: Self-pay

## 2019-06-04 DIAGNOSIS — R202 Paresthesia of skin: Secondary | ICD-10-CM

## 2019-06-04 DIAGNOSIS — R200 Anesthesia of skin: Secondary | ICD-10-CM

## 2019-06-06 ENCOUNTER — Encounter: Payer: Self-pay | Admitting: Orthopedic Surgery

## 2019-06-06 NOTE — Progress Notes (Signed)
Office Visit Note   Patient: [name]           Date of Birth: [birth year]           MRN: [id number] Visit Date: 06/04/2019 Requested by: Mikey Kirschner, [place],  [place] [address] PCP: Mikey Kirschner, MD  Subjective: Chief Complaint  Patient presents with  . Right Shoulder - Follow-up    HPI: [name] is a 64 y.o. female who presents to the office for follow-up s/p right shoulder lower trapezius tendon transfer.  Patient notes that she is doing better and she has stopped physical therapy due to financial reasons and she has transition to home exercise program.  She notes no significant complaints with her shoulder but her main complaint is with numbness and tingling in her hand.  She notes daytime numbness and tingling in all 5 fingers of her right hand as well as whole arm numbness and tingling at night.  She has a history of dealing with carpal tunnel.  She also notes worsening neck pain over the last couple months that wakes her up at night occasionally.  She has no history of neck surgery..                ROS:  All systems reviewed are negative as they relate to the chief complaint within the history of present illness.  Patient denies fevers or chills.  Assessment & Plan: Visit Diagnoses:  1. Numbness and tingling of right hand     Plan: Patient is a 64 year old female who presents s/p right shoulder lower trapezius tendon transfer.  She is doing well following that surgery.  She has excellent strength of shoulder abduction, external rotation, forward flexion, internal rotation and has excellent range of motion functionally.  Her main complaint is the worsening numbness and tingling primarily in her right hand.  She does have a history of carpal tunnel.  Ordered EMG/nerve conduction study of the right arm for further evaluation.  She will follow-up after that study to review results.  Follow-Up Instructions: No follow-ups on file.   Orders:    Orders Placed This Encounter  Procedures  . Ambulatory referral to Physical Medicine Rehab   No orders of the defined types were placed in this encounter.     Procedures: No procedures performed   Clinical Data: No additional findings.  Objective: Vital Signs: There were no vitals taken for this visit.  Physical Exam:  Constitutional: Patient appears well-developed HEENT:  Head: Normocephalic Eyes:EOM are normal Neck: Normal range of motion Cardiovascular: Normal rate Pulmonary/chest: Effort normal Neurologic: Patient is alert Skin: Skin is warm Psychiatric: Patient has normal mood and affect  Ortho Exam:  Well-healed incisions from previous right shoulder tendon transfer. Excellent strength with external rotation, abduction, internal rotation of the right shoulder. Excellent functional range of motion of the right shoulder and is easily able to get her hand overhead. Negative Phalen's test of right hand.  Negative Tinel's test of right hand.  Negative elbow flexion test.  Reduced sensation of the palmar aspect of the index finger.  Negative Froment's sign. Axial neck pain elicited with Spurling test bilaterally  Specialty Comments:  No specialty comments available.  Imaging: No results found.   PMFS History: Patient Active Problem List   Diagnosis Date Noted  . Upper airway cough syndrome 07/30/2018  . Morbid (severe) obesity due to excess calories ([place]) 07/30/2018  . Urinary, incontinence, stress female 05/15/2018  .  Encounter for screening breast examination 05/15/2018  . Depression, major, single episode, moderate ([place]) 10/17/2017  . Hypothyroidism 09/19/2017  . Special screening for malignant neoplasms, colon   . Moody 12/30/2014  . Counseling for estrogen replacement therapy 12/30/2014  . 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 Mixed stress and urge urinary incontinence 02/10/2014  . Diabetes ([place]) 01/18/2014  . Posterior tibialis muscle dysfunction  10/24/2013  . Hyperlipidemia LDL goal <100 03/10/2013  . Superficial fungus infection of skin 07/05/2012  . Hot flashes 07/05/2012  . Rectocele 07/05/2012  . Essential hypertension 07/04/2012  . Hx of cervical cancer 07/04/2012  . Stiffness of joint, lower leg, left 08/28/2011  . Difficulty in walking(719.7) 08/28/2011  . Pain in left knee 08/28/2011   Past Medical History:  Diagnosis Date  . Arthritis    "fingers occasionally" (08/26/2014)  . Asthmatic bronchitis    "haven't used an inhaler in years" (08/26/2014)  . Cervical cancer ([place])   . Counseling for estrogen replacement therapy 12/30/2014  . Depression   . Diabetes mellitus without complication ([place])    type- 2  . Elevated cholesterol   . Exertional shortness of breath   . Family history of adverse reaction to anesthesia    "I think my mama gets sick"  . GERD (gastroesophageal reflux disease)   . Hot flashes 07/05/2012  . Hypertension   . Hypothyroidism   . IFG (impaired fasting glucose)   . Mixed stress and urge urinary incontinence   . Moody 12/30/2014  . Obesity   . Panniculus   . PONV (postoperative nausea and vomiting)   . Rectocele 07/05/2012  . Stress incontinence   . Superficial fungus infection of skin     Family History  Problem Relation Age of Onset  . Heart attack Father   . Stroke Mother   . Diabetes Brother   . Hypertension Brother   . Diabetes Brother   . Hypertension Brother   . Diabetes Maternal Grandmother   . Cancer Maternal Aunt        cervical  . Diabetes Paternal Grandmother     Past Surgical History:  Procedure Laterality Date  . ABDOMINAL HYSTERECTOMY  1986   "partial"  . ANKLE FUSION Left 10/24/2013   Procedure: LEFT ANKLE SUBTALAR AND TALONAVICULAR FUSION;  Surgeon: Newt Minion, MD;  Location: [facility];  Service: Orthopedics;  Laterality: Left;  . ANKLE FUSION Left 08/26/2014   Procedure: Left Foot Take Down Non-union with Revision Talonavicular and Subtalar Fusion;  Surgeon: Newt Minion, MD;  Location: [facility];  Service: Orthopedics;  Laterality: Left;  . BACK SURGERY    . BILATERAL SALPINGECTOMY  2010   w/LOA  . COLONOSCOPY    . COLONOSCOPY N/A 07/14/2016   Procedure: COLONOSCOPY;  Surgeon: Danie Binder, MD;  [facility];  Service: Endoscopy;  Laterality: N/A;  8:30  . FUSION OF TALONAVICULAR JOINT Left    Take Down Non-union with Revision Talonavicular and Subtalar Fusion /notes 08/26/2014  . HAMMER TOE SURGERY Bilateral 2000-2013   right-left  . JOINT REPLACEMENT    . KNEE ARTHROSCOPY Bilateral 2008-2010    left-right  . LUMBAR DISC SURGERY  2004   Lumbar 4- 5  . POLYPECTOMY  07/14/2016   Procedure: POLYPECTOMY;  Surgeon: Danie Binder, MD;  [facility];  Service: Endoscopy;;  hepatic flexure  . SHOULDER ARTHROSCOPY WITH SUBACROMIAL DECOMPRESSION, ROTATOR CUFF REPAIR AND BICEP TENDON REPAIR Right 02/11/2019   Procedure: right shoulde arthroscopy,  biceps tenodesis lower trapezius tendon transfer;  Surgeon: Meredith Pel, MD;  Location: [facility];  Service: Orthopedics;  Laterality: Right;  . TOTAL KNEE ARTHROPLASTY Right 2013  . TUBAL LIGATION     Social History   Occupational History  . Not on file  Tobacco Use  . Smoking status: Former Smoker    Packs/day: 0.50    Years: 10.00    Pack years: 5.00    Types: Cigarettes    Quit date: 03/27/1988    Years since quitting: 31.2  . Smokeless tobacco: Never Used  Substance and Sexual Activity  . Alcohol use: Yes    Comment: occasionally a beer  . Drug use: No  . Sexual activity: Not Currently    Birth control/protection: Surgical    Comment: hyst

## 2019-06-09 ENCOUNTER — Encounter (HOSPITAL_COMMUNITY): Payer: Medicare HMO

## 2019-06-24 DIAGNOSIS — R69 Illness, unspecified: Secondary | ICD-10-CM | POA: Diagnosis not present

## 2019-06-26 ENCOUNTER — Other Ambulatory Visit: Payer: Self-pay

## 2019-06-26 ENCOUNTER — Ambulatory Visit: Payer: Medicare HMO | Admitting: Physical Medicine and Rehabilitation

## 2019-06-26 ENCOUNTER — Encounter: Payer: Self-pay | Admitting: Physical Medicine and Rehabilitation

## 2019-06-26 DIAGNOSIS — R202 Paresthesia of skin: Secondary | ICD-10-CM | POA: Diagnosis not present

## 2019-06-26 NOTE — Progress Notes (Signed)
[name] - 64 y.o. female MRN [id number]  Date of birth: 10-27-1955  Office Visit Note: Visit Date: 06/26/2019 PCP: Mikey Kirschner, MD Referred by: Mikey Kirschner, MD  Subjective: Chief Complaint  Patient presents with  . Right Arm - Pain, Numbness, Tingling  . Right Hand - Pain, Numbness, Tingling, Edema   HPI: [name] is a 64 y.o. female who comes in today For electrodiagnostic study of the right upper limb as requested by Dr. Anderson Malta.  Patient is right-hand dominant with history of off-and-on problems with what she refers to as carpal tunnel syndrome that was treated really by herself with anti-inflammatory and some different types of gloves but no specific carpal tunnel splint or evaluation electrodiagnostic study.  She reports surgery for her shoulder in November 2020 and since that time she has had worsening symptoms of pain numbness and swelling and tingling in the right arm and forearm and right hand particularly the thumb and middle digits but most of the radial digits in general.  She reports occasional symptom on the left.  Denies any frank radicular symptoms.  She has lot of difficulty with gripping items and strength.  She gets times where it will be quite severe just with any sort of change of position.  She rates her pain as a 7 out of 10.  She does have type 2 diabetes and her last hemoglobin A1c was 14.5.  ROS Otherwise per HPI.  Assessment & Plan: Visit Diagnoses:  1. Paresthesia of skin     Plan: Impression: The above electrodiagnostic study is ABNORMAL and reveals evidence of a severe right median nerve entrapment at the wrist (carpal tunnel syndrome) affecting sensory and motor components. The lesion is characterized by sensory and motor demyelination with evidence of axonal injury.    There is no significant electrodiagnostic evidence of any other focal nerve entrapment, brachial plexopathy or cervical radiculopathy.   Recommendations: 1.   Follow-up with referring physician. 2.  Continue current management of symptoms. 3.  Suggest surgical evaluation.  Meds & Orders: No orders of the defined types were placed in this encounter.   Orders Placed This Encounter  Procedures  . NCV with EMG (electromyography)    Follow-up: Return for Anderson Malta, MD.   Procedures: No procedures performed  EMG & NCV Findings: Evaluation of the right median motor nerve showed prolonged distal onset latency (8.5 ms), reduced amplitude (2.5 mV), and decreased conduction velocity (Elbow-Wrist, 26 m/s).  The right median (across palm) sensory nerve showed no response (Palm), prolonged distal peak latency (5.0 ms), and reduced amplitude (3.2 V).  The right ulnar sensory nerve showed reduced amplitude (13.7 V).  All remaining nerves (as indicated in the following tables) were within normal limits.    Needle evaluation of the right abductor pollicis brevis muscle showed increased insertional activity and slightly increased spontaneous activity.  All remaining muscles (as indicated in the following table) showed no evidence of electrical instability.    Impression: The above electrodiagnostic study is ABNORMAL and reveals evidence of a severe right median nerve entrapment at the wrist (carpal tunnel syndrome) affecting sensory and motor components. The lesion is characterized by sensory and motor demyelination with evidence of axonal injury.    There is no significant electrodiagnostic evidence of any other focal nerve entrapment, brachial plexopathy or cervical radiculopathy.   Recommendations: 1.  Follow-up with referring physician. 2.  Continue current management of symptoms. 3.  Suggest surgical evaluation.  ___________________________ Laurence Spates FAAPMR Board Certified, American Board of Physical Medicine and Rehabilitation    Nerve Conduction Studies Anti Sensory Summary Table   Stim Site NR Peak (ms) Norm Peak (ms) P-T Amp (V) Norm  P-T Amp Site1 Site2 Delta-P (ms) Dist (cm) Vel (m/s) Norm Vel (m/s)  Right Median Acr Palm Anti Sensory (2nd Digit)  30C  Wrist    *5.0 <3.6 *3.2 >10 Wrist Palm  0.0    Palm *NR  <2.0          Right Radial Anti Sensory (Base 1st Digit)  30.6C  Wrist    2.2 <3.1 20.4  Wrist Base 1st Digit 2.2 0.0    Right Ulnar Anti Sensory (5th Digit)  30.4C  Wrist    3.7 <3.7 *13.7 >15.0 Wrist 5th Digit 3.7 14.0 38 >38   Motor Summary Table   Stim Site NR Onset (ms) Norm Onset (ms) O-P Amp (mV) Norm O-P Amp Site1 Site2 Delta-0 (ms) Dist (cm) Vel (m/s) Norm Vel (m/s)  Right Median Motor (Abd Poll Brev)  30.6C  Wrist    *8.5 <4.2 *2.5 >5 Elbow Wrist 7.4 19.5 *26 >50  Elbow    15.9  0.0         Right Ulnar Motor (Abd Dig Min)  30.6C  Wrist    3.2 <4.2 7.7 >3 B Elbow Wrist 3.5 18.5 53 >53  B Elbow    6.7  7.8  A Elbow B Elbow 1.8 10.0 56 >53  A Elbow    8.5  8.2          EMG   Side Muscle Nerve Root Ins Act Fibs Psw Amp Dur Poly Recrt Int Fraser Din Comment  Right Abd Poll Brev Median C8-T1 *Incr *1+ *1+ Nml Nml 0 Nml Nml   Right 1stDorInt Ulnar C8-T1 Nml Nml Nml Nml Nml 0 Nml Nml   Right PronatorTeres Median C6-7 Nml Nml Nml Nml Nml 0 Nml Nml   Right Biceps Musculocut C5-6 Nml Nml Nml Nml Nml 0 Nml Nml     Nerve Conduction Studies Anti Sensory Left/Right Comparison   Stim Site L Lat (ms) R Lat (ms) L-R Lat (ms) L Amp (V) R Amp (V) L-R Amp (%) Site1 Site2 L Vel (m/s) R Vel (m/s) L-R Vel (m/s)  Median Acr Palm Anti Sensory (2nd Digit)  30C  Wrist  *5.0   *3.2  Wrist Palm     Palm             Radial Anti Sensory (Base 1st Digit)  30.6C  Wrist  2.2   20.4  Wrist Base 1st Digit     Ulnar Anti Sensory (5th Digit)  30.4C  Wrist  3.7   *13.7  Wrist 5th Digit  38    Motor Left/Right Comparison   Stim Site L Lat (ms) R Lat (ms) L-R Lat (ms) L Amp (mV) R Amp (mV) L-R Amp (%) Site1 Site2 L Vel (m/s) R Vel (m/s) L-R Vel (m/s)  Median Motor (Abd Poll Brev)  30.6C  Wrist  *8.5   *2.5  Elbow Wrist  *26    Elbow  15.9   0.0        Ulnar Motor (Abd Dig Min)  30.6C  Wrist  3.2   7.7  B Elbow Wrist  53   B Elbow  6.7   7.8  A Elbow B Elbow  56   A Elbow  8.5   8.2  Waveforms:             Clinical History: No specialty comments available.   She reports that she quit smoking about 31 years ago. Her smoking use included cigarettes. She has a 5.00 pack-year smoking history. She has never used smokeless tobacco.  Recent Labs    10/17/18 1029 01/15/19 1015  HGBA1C 14.5* 7.2*    Objective:  VS:  HT:    WT:   BMI:     BP:   HR: bpm  TEMP: ( )  RESP:  Physical Exam Musculoskeletal:        General: No swelling, tenderness or deformity.     Comments: Inspection reveals flattening of the right APB but no atrophy of the left APB or bilateral  FDI or hand intrinsics. There is no swelling, color changes, allodynia or dystrophic changes. There is 5 out of 5 strength in the bilateral wrist extension, finger abduction and long finger flexion. There is decreased sensation to light touch in the median nerve distribution on the right.. There is a negative Hoffmann's test bilaterally.  Skin:    General: Skin is warm and dry.     Findings: No erythema or rash.  Neurological:     General: No focal deficit present.     Mental Status: She is alert and oriented to person, place, and time.     Motor: No weakness or abnormal muscle tone.     Coordination: Coordination normal.  Psychiatric:        Mood and Affect: Mood normal.        Behavior: Behavior normal.     Ortho Exam Imaging: No results found.  Past Medical/Family/Surgical/Social History: Medications & Allergies reviewed per EMR, new medications updated. Patient Active Problem List   Diagnosis Date Noted  . Upper airway cough syndrome 07/30/2018  . Morbid (severe) obesity due to excess calories ([place]) 07/30/2018  . Urinary, incontinence, stress female 05/15/2018  . Encounter for screening breast examination 05/15/2018    . Depression, major, single episode, moderate ([place]) 10/17/2017  . Hypothyroidism 09/19/2017  . Special screening for malignant neoplasms, colon   . Moody 12/30/2014  . Counseling for estrogen replacement therapy 12/30/2014  . 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 Mixed stress and urge urinary incontinence 02/10/2014  . Diabetes ([place]) 01/18/2014  . Posterior tibialis muscle dysfunction 10/24/2013  . Hyperlipidemia LDL goal <100 03/10/2013  . Superficial fungus infection of skin 07/05/2012  . Hot flashes 07/05/2012  . Rectocele 07/05/2012  . Essential hypertension 07/04/2012  . Hx of cervical cancer 07/04/2012  . Stiffness of joint, lower leg, left 08/28/2011  . Difficulty in walking(719.7) 08/28/2011  . Pain in left knee 08/28/2011   Past Medical History:  Diagnosis Date  . Arthritis    "fingers occasionally" (08/26/2014)  . Asthmatic bronchitis    "haven't used an inhaler in years" (08/26/2014)  . Cervical cancer ([place])   . Counseling for estrogen replacement therapy 12/30/2014  . Depression   . Diabetes mellitus without complication ([place])    type- 2  . Elevated cholesterol   . Exertional shortness of breath   . Family history of adverse reaction to anesthesia    "I think my mama gets sick"  . GERD (gastroesophageal reflux disease)   . Hot flashes 07/05/2012  . Hypertension   . Hypothyroidism   . IFG (impaired fasting glucose)   . Mixed stress and urge urinary incontinence   . Moody 12/30/2014  . Obesity   . Panniculus   .  PONV (postoperative nausea and vomiting)   . Rectocele 07/05/2012  . Stress incontinence   . Superficial fungus infection of skin    Family History  Problem Relation Age of Onset  . Heart attack Father   . Stroke Mother   . Diabetes Brother   . Hypertension Brother   . Diabetes Brother   . Hypertension Brother   . Diabetes Maternal Grandmother   . Cancer Maternal Aunt        cervical  . Diabetes Paternal Grandmother    Past Surgical History:   Procedure Laterality Date  . ABDOMINAL HYSTERECTOMY  1986   "partial"  . ANKLE FUSION Left 10/24/2013   Procedure: LEFT ANKLE SUBTALAR AND TALONAVICULAR FUSION;  Surgeon: Newt Minion, MD;  Location: [facility];  Service: Orthopedics;  Laterality: Left;  . ANKLE FUSION Left 08/26/2014   Procedure: Left Foot Take Down Non-union with Revision Talonavicular and Subtalar Fusion;  Surgeon: Newt Minion, MD;  Location: [facility];  Service: Orthopedics;  Laterality: Left;  . BACK SURGERY    . BILATERAL SALPINGECTOMY  2010   w/LOA  . COLONOSCOPY    . COLONOSCOPY N/A 07/14/2016   Procedure: COLONOSCOPY;  Surgeon: Danie Binder, MD;  [facility];  Service: Endoscopy;  Laterality: N/A;  8:30  . FUSION OF TALONAVICULAR JOINT Left    Take Down Non-union with Revision Talonavicular and Subtalar Fusion /notes 08/26/2014  . HAMMER TOE SURGERY Bilateral 2000-2013   right-left  . JOINT REPLACEMENT    . KNEE ARTHROSCOPY Bilateral 2008-2010    left-right  . LUMBAR DISC SURGERY  2004   Lumbar 4- 5  . POLYPECTOMY  07/14/2016   Procedure: POLYPECTOMY;  Surgeon: Danie Binder, MD;  [facility];  Service: Endoscopy;;  hepatic flexure  . SHOULDER ARTHROSCOPY WITH SUBACROMIAL DECOMPRESSION, ROTATOR CUFF REPAIR AND BICEP TENDON REPAIR Right 02/11/2019   Procedure: right shoulde arthroscopy, biceps tenodesis lower trapezius tendon transfer;  Surgeon: Meredith Pel, MD;  Location: [facility];  Service: Orthopedics;  Laterality: Right;  . TOTAL KNEE ARTHROPLASTY Right 2013  . TUBAL LIGATION     Social History   Occupational History  . Not on file  Tobacco Use  . Smoking status: Former Smoker    Packs/day: 0.50    Years: 10.00    Pack years: 5.00    Types: Cigarettes    Quit date: 03/27/1988    Years since quitting: 31.2  . Smokeless tobacco: Never Used  Substance and Sexual Activity  . Alcohol use: Yes    Comment: occasionally a beer  . Drug use: No  . Sexual activity: Not Currently     Birth control/protection: Surgical    Comment: hyst

## 2019-06-26 NOTE — Procedures (Signed)
EMG & NCV Findings: Evaluation of the right median motor nerve showed prolonged distal onset latency (8.5 ms), reduced amplitude (2.5 mV), and decreased conduction velocity (Elbow-Wrist, 26 m/s).  The right median (across palm) sensory nerve showed no response (Palm), prolonged distal peak latency (5.0 ms), and reduced amplitude (3.2 V).  The right ulnar sensory nerve showed reduced amplitude (13.7 V).  All remaining nerves (as indicated in the following tables) were within normal limits.    Needle evaluation of the right abductor pollicis brevis muscle showed increased insertional activity and slightly increased spontaneous activity.  All remaining muscles (as indicated in the following table) showed no evidence of electrical instability.    Impression: The above electrodiagnostic study is ABNORMAL and reveals evidence of a severe right median nerve entrapment at the wrist (carpal tunnel syndrome) affecting sensory and motor components. The lesion is characterized by sensory and motor demyelination with evidence of axonal injury.    There is no significant electrodiagnostic evidence of any other focal nerve entrapment, brachial plexopathy or cervical radiculopathy.   Recommendations: 1.  Follow-up with referring physician. 2.  Continue current management of symptoms. 3.  Suggest surgical evaluation.  ___________________________ Laurence Spates FAAPMR Board Certified, American Board of Physical Medicine and Rehabilitation    Nerve Conduction Studies Anti Sensory Summary Table   Stim Site NR Peak (ms) Norm Peak (ms) P-T Amp (V) Norm P-T Amp Site1 Site2 Delta-P (ms) Dist (cm) Vel (m/s) Norm Vel (m/s)  Right Median Acr Palm Anti Sensory (2nd Digit)  30C  Wrist    *5.0 <3.6 *3.2 >10 Wrist Palm  0.0    Palm *NR  <2.0          Right Radial Anti Sensory (Base 1st Digit)  30.6C  Wrist    2.2 <3.1 20.4  Wrist Base 1st Digit 2.2 0.0    Right Ulnar Anti Sensory (5th Digit)  30.4C  Wrist    3.7  <3.7 *13.7 >15.0 Wrist 5th Digit 3.7 14.0 38 >38   Motor Summary Table   Stim Site NR Onset (ms) Norm Onset (ms) O-P Amp (mV) Norm O-P Amp Site1 Site2 Delta-0 (ms) Dist (cm) Vel (m/s) Norm Vel (m/s)  Right Median Motor (Abd Poll Brev)  30.6C  Wrist    *8.5 <4.2 *2.5 >5 Elbow Wrist 7.4 19.5 *26 >50  Elbow    15.9  0.0         Right Ulnar Motor (Abd Dig Min)  30.6C  Wrist    3.2 <4.2 7.7 >3 B Elbow Wrist 3.5 18.5 53 >53  B Elbow    6.7  7.8  A Elbow B Elbow 1.8 10.0 56 >53  A Elbow    8.5  8.2          EMG   Side Muscle Nerve Root Ins Act Fibs Psw Amp Dur Poly Recrt Int Fraser Din Comment  Right Abd Poll Brev Median C8-T1 *Incr *1+ *1+ Nml Nml 0 Nml Nml   Right 1stDorInt Ulnar C8-T1 Nml Nml Nml Nml Nml 0 Nml Nml   Right PronatorTeres Median C6-7 Nml Nml Nml Nml Nml 0 Nml Nml   Right Biceps Musculocut C5-6 Nml Nml Nml Nml Nml 0 Nml Nml     Nerve Conduction Studies Anti Sensory Left/Right Comparison   Stim Site L Lat (ms) R Lat (ms) L-R Lat (ms) L Amp (V) R Amp (V) L-R Amp (%) Site1 Site2 L Vel (m/s) R Vel (m/s) L-R Vel (m/s)  Median Acr Palm Anti Sensory (  2nd Digit)  30C  Wrist  *5.0   *3.2  Wrist Palm     Palm             Radial Anti Sensory (Base 1st Digit)  30.6C  Wrist  2.2   20.4  Wrist Base 1st Digit     Ulnar Anti Sensory (5th Digit)  30.4C  Wrist  3.7   *13.7  Wrist 5th Digit  38    Motor Left/Right Comparison   Stim Site L Lat (ms) R Lat (ms) L-R Lat (ms) L Amp (mV) R Amp (mV) L-R Amp (%) Site1 Site2 L Vel (m/s) R Vel (m/s) L-R Vel (m/s)  Median Motor (Abd Poll Brev)  30.6C  Wrist  *8.5   *2.5  Elbow Wrist  *26   Elbow  15.9   0.0        Ulnar Motor (Abd Dig Min)  30.6C  Wrist  3.2   7.7  B Elbow Wrist  53   B Elbow  6.7   7.8  A Elbow B Elbow  56   A Elbow  8.5   8.2           Waveforms:

## 2019-06-26 NOTE — Progress Notes (Signed)
.  Numeric Pain Rating Scale and Functional Assessment Average Pain 7   In the last MONTH (on 0-10 scale) has pain interfered with the following?  1. General activity like being  able to carry out your everyday physical activities such as walking, climbing stairs, carrying groceries, or moving a chair?  Rating(8)     

## 2019-07-02 ENCOUNTER — Other Ambulatory Visit: Payer: Self-pay | Admitting: Adult Health

## 2019-07-07 ENCOUNTER — Other Ambulatory Visit: Payer: Self-pay

## 2019-07-07 ENCOUNTER — Ambulatory Visit: Payer: Medicare HMO | Admitting: Orthopedic Surgery

## 2019-07-07 DIAGNOSIS — G5601 Carpal tunnel syndrome, right upper limb: Secondary | ICD-10-CM

## 2019-07-09 DIAGNOSIS — Z0100 Encounter for examination of eyes and vision without abnormal findings: Secondary | ICD-10-CM | POA: Diagnosis not present

## 2019-07-11 ENCOUNTER — Encounter: Payer: Self-pay | Admitting: Orthopedic Surgery

## 2019-07-11 NOTE — Progress Notes (Signed)
Office Visit Note   Patient: [name]           Date of Birth: 1955/08/30           MRN: [id number] Visit Date: 07/07/2019 Requested by: Mikey Kirschner, [address] [place],  [place] [address] PCP: Mikey Kirschner, MD  Subjective: Chief Complaint  Patient presents with  . Follow-up    HPI: [name] is a 64 y.o. female who presents to the office complaining of right hand pain.  She returns to discuss EMG/nerve conduction study results.  Study revealed severe right-sided carpal tunnel syndrome.  She has had years of symptoms.  She notes a stinging/burning pain in her first 3 fingers of the right hand.  She denies any left-sided symptoms.  She has previously been able to control her symptoms with night splints but her symptoms are uncontrollable in the last several months.  She has a history of diabetes with her last A1c of 7.2.  She also has a history of hypothyroidism.  Denies any history of OSA, blood clots..                ROS:  All systems reviewed are negative as they relate to the chief complaint within the history of present illness.  Patient denies fevers or chills.  Assessment & Plan: Visit Diagnoses:  1. Carpal tunnel syndrome, right upper limb     Plan: Patient is a 64 year old female who presents complaining of right hand pain with numbness and tingling.  She has severe right-sided carpal tunnel syndrome.  She has associated mild thenar atrophy of the right hand has not present on the left hand.  Plan to post patient for right-sided carpal tunnel release.  Patient will follow-up after surgery.  Risk and benefits are discussed with the patient including but limited to infection nerve vessel damage potential delayed return of normal sensory function based on the severity of the preoperative symptoms on EMG nerve study.  Patient understands the risk and benefits.  All questions answered.  Postop rehab also discussed. Follow-Up Instructions: No follow-ups on  file.   Orders:  No orders of the defined types were placed in this encounter.  No orders of the defined types were placed in this encounter.     Procedures: No procedures performed   Clinical Data: No additional findings.  Objective: Vital Signs: There were no vitals taken for this visit.  Physical Exam:  Constitutional: Patient appears well-developed HEENT:  Head: Normocephalic Eyes:EOM are normal Neck: Normal range of motion Cardiovascular: Normal rate Pulmonary/chest: Effort normal Neurologic: Patient is alert Skin: Skin is warm Psychiatric: Patient has normal mood and affect  Ortho Exam:  Diminished sensation in the thumb, index, middle fingers of the right hand compared to contralateral side.  Full sensation on the palmar aspect of the fourth and fifth fingers.  Thenar atrophy is present on the right hand that is not present on the left hand.  Negative Tinel's sign.  Negative Phalen's sign.  Positive Durkan sign.  No interosseous wasting.  Specialty Comments:  No specialty comments available.  Imaging: No results found.   PMFS History: Patient Active Problem List   Diagnosis Date Noted  . Upper airway cough syndrome 07/30/2018  . Morbid (severe) obesity due to excess calories ([place]) 07/30/2018  . Urinary, incontinence, stress female 05/15/2018  . Encounter for screening breast examination 05/15/2018  . Depression, major, single episode, moderate ([place]) 10/17/2017  . Hypothyroidism 09/19/2017  .  Special screening for malignant neoplasms, colon   . Moody 12/30/2014  . Counseling for estrogen replacement therapy 12/30/2014  . 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 Mixed stress and urge urinary incontinence 02/10/2014  . Diabetes ([place]) 01/18/2014  . Posterior tibialis muscle dysfunction 10/24/2013  . Hyperlipidemia LDL goal <100 03/10/2013  . Superficial fungus infection of skin 07/05/2012  . Hot flashes 07/05/2012  . Rectocele 07/05/2012  . Essential  hypertension 07/04/2012  . Hx of cervical cancer 07/04/2012  . Stiffness of joint, lower leg, left 08/28/2011  . Difficulty in walking(719.7) 08/28/2011  . Pain in left knee 08/28/2011   Past Medical History:  Diagnosis Date  . Arthritis    "fingers occasionally" (08/26/2014)  . Asthmatic bronchitis    "haven't used an inhaler in years" (08/26/2014)  . Cervical cancer ([place])   . Counseling for estrogen replacement therapy 12/30/2014  . Depression   . Diabetes mellitus without complication ([place])    type- 2  . Elevated cholesterol   . Exertional shortness of breath   . Family history of adverse reaction to anesthesia    "I think my mama gets sick"  . GERD (gastroesophageal reflux disease)   . Hot flashes 07/05/2012  . Hypertension   . Hypothyroidism   . IFG (impaired fasting glucose)   . Mixed stress and urge urinary incontinence   . Moody 12/30/2014  . Obesity   . Panniculus   . PONV (postoperative nausea and vomiting)   . Rectocele 07/05/2012  . Stress incontinence   . Superficial fungus infection of skin     Family History  Problem Relation Age of Onset  . Heart attack Father   . Stroke Mother   . Diabetes Brother   . Hypertension Brother   . Diabetes Brother   . Hypertension Brother   . Diabetes Maternal Grandmother   . Cancer Maternal Aunt        cervical  . Diabetes Paternal Grandmother     Past Surgical History:  Procedure Laterality Date  . ABDOMINAL HYSTERECTOMY  1986   "partial"  . ANKLE FUSION Left 10/24/2013   Procedure: LEFT ANKLE SUBTALAR AND TALONAVICULAR FUSION;  Surgeon: Newt Minion, MD;  Location: [facility];  Service: Orthopedics;  Laterality: Left;  . ANKLE FUSION Left 08/26/2014   Procedure: Left Foot Take Down Non-union with Revision Talonavicular and Subtalar Fusion;  Surgeon: Newt Minion, MD;  Location: [facility];  Service: Orthopedics;  Laterality: Left;  . BACK SURGERY    . BILATERAL SALPINGECTOMY  2010   w/LOA  . COLONOSCOPY    . COLONOSCOPY N/A  07/14/2016   Procedure: COLONOSCOPY;  Surgeon: Danie Binder, MD;  [facility];  Service: Endoscopy;  Laterality: N/A;  8:30  . FUSION OF TALONAVICULAR JOINT Left    Take Down Non-union with Revision Talonavicular and Subtalar Fusion /notes 08/26/2014  . HAMMER TOE SURGERY Bilateral 2000-2013   right-left  . JOINT REPLACEMENT    . KNEE ARTHROSCOPY Bilateral 2008-2010    left-right  . LUMBAR DISC SURGERY  2004   Lumbar 4- 5  . POLYPECTOMY  07/14/2016   Procedure: POLYPECTOMY;  Surgeon: Danie Binder, MD;  [facility];  Service: Endoscopy;;  hepatic flexure  . SHOULDER ARTHROSCOPY WITH SUBACROMIAL DECOMPRESSION, ROTATOR CUFF REPAIR AND BICEP TENDON REPAIR Right 02/11/2019   Procedure: right shoulde arthroscopy, biceps tenodesis lower trapezius tendon transfer;  Surgeon: Meredith Pel, MD;  Location: [facility];  Service: Orthopedics;  Laterality:  Right;  Marland Kitchen TOTAL KNEE ARTHROPLASTY Right 2013  . TUBAL LIGATION     Social History   Occupational History  . Not on file  Tobacco Use  . Smoking status: Former Smoker    Packs/day: 0.50    Years: 10.00    Pack years: 5.00    Types: Cigarettes    Quit date: 03/27/1988    Years since quitting: 31.3  . Smokeless tobacco: Never Used  Substance and Sexual Activity  . Alcohol use: Yes    Comment: occasionally a beer  . Drug use: No  . Sexual activity: Not Currently    Birth control/protection: Surgical    Comment: hyst

## 2019-07-12 ENCOUNTER — Other Ambulatory Visit: Payer: Self-pay | Admitting: Internal Medicine

## 2019-07-13 ENCOUNTER — Other Ambulatory Visit: Payer: Self-pay | Admitting: Family Medicine

## 2019-07-14 NOTE — Telephone Encounter (Signed)
lvm to schedule appt.  

## 2019-07-14 NOTE — Telephone Encounter (Signed)
Ok times three mo, rec contact pt and rec chronic o v

## 2019-07-14 NOTE — Telephone Encounter (Signed)
Per last office visit in Sept 2020 patient bp med changed to Micardis 40-12.5 Pt was advised to discuss with pcp. Contacted patient and she did not discuss on last visit. Dr. Wolfgang Phoenix is retiring and pt has appt to establish care with another provider. She would like to know if Micardis could be filled once more?

## 2019-07-15 NOTE — Telephone Encounter (Signed)
OV scheduled for 07/18/2019 w/Dr. Lovena Le  Please send refills

## 2019-07-18 ENCOUNTER — Encounter: Payer: Self-pay | Admitting: Family Medicine

## 2019-07-18 ENCOUNTER — Other Ambulatory Visit: Payer: Self-pay

## 2019-07-18 ENCOUNTER — Ambulatory Visit (INDEPENDENT_AMBULATORY_CARE_PROVIDER_SITE_OTHER): Payer: Medicare HMO | Admitting: Family Medicine

## 2019-07-18 VITALS — BP 138/84 | HR 91 | Temp 97.4°F | Wt 250.4 lb

## 2019-07-18 DIAGNOSIS — E119 Type 2 diabetes mellitus without complications: Secondary | ICD-10-CM | POA: Diagnosis not present

## 2019-07-18 DIAGNOSIS — E785 Hyperlipidemia, unspecified: Secondary | ICD-10-CM

## 2019-07-18 DIAGNOSIS — I10 Essential (primary) hypertension: Secondary | ICD-10-CM | POA: Diagnosis not present

## 2019-07-18 DIAGNOSIS — E039 Hypothyroidism, unspecified: Secondary | ICD-10-CM

## 2019-07-18 LAB — POCT GLYCOSYLATED HEMOGLOBIN (HGB A1C): Hemoglobin A1C: 6.1 % — AB (ref 4.0–5.6)

## 2019-07-18 MED ORDER — TELMISARTAN-HCTZ 40-12.5 MG PO TABS: 1.0000 | ORAL_TABLET | Freq: Every day | ORAL | 1 refills | Status: DC

## 2019-07-18 NOTE — Progress Notes (Signed)
Patient ID: [name], female    DOB: 10/05/1955, 64 y.o.   MRN: [id number]   Chief Complaint  Patient presents with  . Diabetes   Subjective:   HPI Pt seen for f/u on DM2, HTN, hypothyroidism, and HLD. Pt doing well, no concerns with medications.  Having surgery on rt wrist carpal tunnel surgery on 08/11/19.  DM2- Doing well with diabetes.  Eating good diet and "thrive" diet and doing well. Eating protein shakes and some supplements. Check BG daily.  Seeing BG  In AM 140-200.  Later in afternoon can get lower in 140 range.  occ a few times a week feeling shaky and checking and seen 70-80s, eating something and it resolves.  No increase in thirst or urination.  No non healing sores/ulcers.   A1c today is 6.1, improved compared to last fall at 7.2.  Had a recent change in her glipizide of 1.5 tab at night and 1 tab in am of '5mg'$  glipizide. Cont with metformin '1000mg'$  bid.  HTN Pt compliant with BP meds.  No SEs Denies chest pain, sob, LE swelling, or blurry vision.  HLD- no new concerns.  No bodyaches or joint pains. Denies CP, sob, headaches, or dizziness.  Hypothyroidism [name] is a 64 y.o. female who presents for follow up of hypothyroidism. Current symptoms: none . Patient denies none. Symptoms have been well-controlled.   Medical History Ayo has a past medical history of Arthritis, Asthmatic bronchitis, Cervical cancer ([place]), Counseling for estrogen replacement therapy (12/30/2014), Depression, Diabetes mellitus without complication ([place]), Elevated cholesterol, Exertional shortness of breath, Family history of adverse reaction to anesthesia, GERD (gastroesophageal reflux disease), Hot flashes (07/05/2012), Hypertension, Hypothyroidism, IFG (impaired fasting glucose), Mixed stress and urge urinary incontinence, Moody (12/30/2014), Obesity, Panniculus, PONV (postoperative nausea and vomiting), Rectocele (07/05/2012), Stress incontinence, and Superficial fungus infection of  skin.   Outpatient Encounter Medications as of 07/18/2019  Medication Sig  . aspirin EC 81 MG tablet Take 1 tablet (81 mg total) by mouth daily.  Marland Kitchen escitalopram (LEXAPRO) 10 MG tablet TAKE 1 TABLET BY MOUTH ONCE DAILY IN THE MORNING  . famotidine (PEPCID) 20 MG tablet Take 20 mg by mouth daily as needed (acid reflux/indigestion.).   Marland Kitchen glipiZIDE (GLUCOTROL) 5 MG tablet Take 1-1.5 tablets (5-7.5 mg total) by mouth See admin instructions. Take 1 tablet (5 mg) by mouth in the morning & 1.5 tablets (7.5 mg) by mouth in the evening.  Marland Kitchen glucose blood (ONETOUCH ULTRA) test strip USE TO TEST BLOOD SUGAR ONCE DAILY. FOR ICD 10 - E11.9  . metFORMIN (GLUCOPHAGE) 500 MG tablet 2 TABLETS TWICE DAILY (Patient taking differently: Take 1,000 mg by mouth 2 (two) times daily. )  . NP THYROID 30 MG tablet TAKE 2 TABLETS BY MOUTH ONCE DAILY BEFORE BREAKFAST (Patient not taking: Reported on 06/26/2019)  . nystatin (MYCOSTATIN/NYSTOP) powder APPLY 3 (THREE) TIMES DAILY TOPICALLY.  [name] Delica Lancets 88E MISC USE TO OBTAIN A BLOOD SPECIMEN TO TEST BLOOD SUGAR (Patient not taking: Reported on 06/26/2019)  . pantoprazole (PROTONIX) 40 MG tablet TAKE 1 TABLET (40 MG TOTAL) BY MOUTH DAILY. TAKE 30-60 MIN BEFORE FIRST MEAL OF THE DAY (Patient not taking: Reported on 05/27/2019)  . simvastatin (ZOCOR) 40 MG tablet Take 1 tablet (40 mg total) by mouth daily.  Marland Kitchen Specialty Vitamins Products (ULTIMATE FAT BURNER PO) Take 1 capsule by mouth daily. Ada  . telmisartan-hydrochlorothiazide (MICARDIS HCT) 40-12.5 MG tablet Take 1 tablet by mouth daily.  Marland Kitchen  thyroid (ARMOUR) 30 MG tablet Take 2 tablets (60 mg total) by mouth daily before breakfast.  . [DISCONTINUED] methocarbamol (ROBAXIN) 500 MG tablet Take 1 tablet (500 mg total) by mouth every 8 (eight) hours as needed for muscle spasms. (Patient not taking: Reported on 05/27/2019)  . [DISCONTINUED] oxyCODONE (ROXICODONE) 5 MG immediate release tablet Take 1 tablet (5 mg total) by  mouth every 8 (eight) hours as needed. (Patient not taking: Reported on 05/27/2019)  . [DISCONTINUED] telmisartan-hydrochlorothiazide (MICARDIS HCT) 40-12.5 MG tablet TAKE 1 TABLET BY MOUTH EVERY DAY   No facility-administered encounter medications on file as of 07/18/2019.   Results for orders placed or performed in visit on 07/18/19  POCT glycosylated hemoglobin (Hb A1C)  Result Value Ref Range   Hemoglobin A1C 6.1 (A) 4.0 - 5.6 %   HbA1c POC (<> result, manual entry)     HbA1c, POC (prediabetic range)     HbA1c, POC (controlled diabetic range)       Review of Systems  Constitutional: Negative for chills and fever.  HENT: Negative for congestion, rhinorrhea and sore throat.   Respiratory: Negative for cough, shortness of breath and wheezing.   Cardiovascular: Negative for chest pain and leg swelling.  Gastrointestinal: Negative for abdominal pain, diarrhea, nausea and vomiting.  Genitourinary: Negative for dysuria and frequency.  Musculoskeletal: Negative for arthralgias and back pain.  Skin: Negative for rash.  Neurological: Negative for dizziness, weakness and headaches.     Vitals BP 138/84   Pulse 91   Temp (!) 97.4 F (36.3 C)   Wt 250 lb 6.4 oz (113.6 kg)   SpO2 95%   BMI 44.36 kg/m   Objective:   Physical Exam Vitals and nursing note reviewed.  Constitutional:      Appearance: Normal appearance.  HENT:     Head: Normocephalic and atraumatic.     Nose: Nose normal.     Mouth/Throat:     Mouth: Mucous membranes are moist.     Pharynx: Oropharynx is clear.  Eyes:     Extraocular Movements: Extraocular movements intact.     Conjunctiva/sclera: Conjunctivae normal.     Pupils: Pupils are equal, round, and reactive to light.  Cardiovascular:     Rate and Rhythm: Normal rate and regular rhythm.     Pulses: Normal pulses.     Heart sounds: Normal heart sounds.  Pulmonary:     Effort: Pulmonary effort is normal.     Breath sounds: Normal breath sounds. No  wheezing, rhonchi or rales.  Musculoskeletal:        General: Normal range of motion.     Right lower leg: No edema.     Left lower leg: No edema.  Skin:    General: Skin is warm and dry.     Findings: No lesion or rash.  Neurological:     General: No focal deficit present.     Mental Status: She is alert and oriented to person, place, and time.  Psychiatric:        Mood and Affect: Mood normal.        Behavior: Behavior normal.      Assessment and Plan   1. Type 2 diabetes mellitus without complication, without long-term current use of insulin (HCC) - POCT glycosylated hemoglobin (Hb A1C) - CBC - CMP14+EGFR - Microalbumin, urine - TSH - Lipid panel  2. Hyperlipidemia LDL goal <100 - CMP14+EGFR  3. Essential hypertension - telmisartan-hydrochlorothiazide (MICARDIS HCT) 40-12.5 MG tablet; Take 1 tablet by  mouth daily.  Dispense: 90 tablet; Refill: 1  4. Hypothyroidism, unspecified type - TSH   DM2- improving, A1c dec from 7.2 to 6.1.  Continue metformin and glipizide. Labs ordered, pt to obtain in the next week.  Cont to check bg and call if seeing numbers under 80.   HTN- suboptimal, stable. will cont to monitor.  Continue simvistatin.  Hypothyroidism- stable.  Labs ordered.  cont current dose armour thyroid.   F/u [place] prn.

## 2019-08-08 ENCOUNTER — Other Ambulatory Visit: Payer: Self-pay | Admitting: Family Medicine

## 2019-08-11 ENCOUNTER — Other Ambulatory Visit: Payer: Self-pay | Admitting: Orthopedic Surgery

## 2019-08-11 DIAGNOSIS — G5601 Carpal tunnel syndrome, right upper limb: Secondary | ICD-10-CM | POA: Diagnosis not present

## 2019-08-11 MED ORDER — OXYCODONE HCL 5 MG PO CAPS: 5.0000 mg | ORAL_CAPSULE | Freq: Four times a day (QID) | ORAL | 0 refills | Status: DC | PRN

## 2019-08-12 ENCOUNTER — Other Ambulatory Visit: Payer: Self-pay | Admitting: Orthopedic Surgery

## 2019-08-12 ENCOUNTER — Telehealth: Payer: Self-pay

## 2019-08-12 MED ORDER — OXYCODONE HCL 5 MG PO TABS: 5.0000 mg | ORAL_TABLET | ORAL | 0 refills | Status: DC | PRN

## 2019-08-12 MED ORDER — OXYCODONE HCL 5 MG PO TABS: 5.0000 mg | ORAL_TABLET | Freq: Four times a day (QID) | ORAL | 0 refills | Status: DC | PRN

## 2019-08-12 NOTE — Telephone Encounter (Signed)
Eusebio Me, pharmacist at [facility] would like a new Rx for Oxycodone.  Stated that Rx may have been deleted on their end and Rx has not be picked up by patient.  Cb# [phone].  Please advise.  Thank you.

## 2019-08-12 NOTE — Telephone Encounter (Signed)
Called in meds

## 2019-08-12 NOTE — Telephone Encounter (Signed)
Pls advise. Thanks.  

## 2019-08-12 NOTE — Telephone Encounter (Signed)
Can you and Lurena Joiner please make sure this gets sent in?

## 2019-08-13 NOTE — Telephone Encounter (Signed)
noted 

## 2019-08-18 ENCOUNTER — Ambulatory Visit (INDEPENDENT_AMBULATORY_CARE_PROVIDER_SITE_OTHER): Payer: Medicare HMO | Admitting: Orthopedic Surgery

## 2019-08-18 ENCOUNTER — Encounter: Payer: Self-pay | Admitting: Orthopedic Surgery

## 2019-08-18 ENCOUNTER — Other Ambulatory Visit: Payer: Self-pay

## 2019-08-18 VITALS — Ht 63.0 in | Wt 250.0 lb

## 2019-08-18 DIAGNOSIS — G5601 Carpal tunnel syndrome, right upper limb: Secondary | ICD-10-CM

## 2019-08-18 DIAGNOSIS — E119 Type 2 diabetes mellitus without complications: Secondary | ICD-10-CM | POA: Diagnosis not present

## 2019-08-19 LAB — CMP14+EGFR
ALT: 17 IU/L (ref 0–32)
AST: 24 IU/L (ref 0–40)
Albumin/Globulin Ratio: 1.5 (ref 1.2–2.2)
Albumin: 4.1 g/dL (ref 3.8–4.8)
Alkaline Phosphatase: 73 IU/L (ref 48–121)
BUN/Creatinine Ratio: 23 (ref 12–28)
BUN: 18 mg/dL (ref 8–27)
Bilirubin Total: 0.4 mg/dL (ref 0.0–1.2)
CO2: 22 mmol/L (ref 20–29)
Calcium: 9.2 mg/dL (ref 8.7–10.3)
Chloride: 102 mmol/L (ref 96–106)
Creatinine, Ser: 0.8 mg/dL (ref 0.57–1.00)
GFR calc Af Amer: 91 mL/min/{1.73_m2} (ref >59)
GFR calc non Af Amer: 79 mL/min/{1.73_m2} (ref >59)
Globulin, Total: 2.8 g/dL (ref 1.5–4.5)
Glucose: 162 mg/dL — ABNORMAL HIGH (ref 65–99)
Potassium: 4.6 mmol/L (ref 3.5–5.2)
Sodium: 139 mmol/L (ref 134–144)
Total Protein: 6.9 g/dL (ref 6.0–8.5)

## 2019-08-19 LAB — MICROALBUMIN, URINE: Microalbumin, Urine: 7.5 ug/mL

## 2019-08-19 LAB — LIPID PANEL
Chol/HDL Ratio: 3.7 ratio (ref 0.0–4.4)
Cholesterol, Total: 178 mg/dL (ref 100–199)
HDL: 48 mg/dL (ref >39)
LDL Chol Calc (NIH): 108 mg/dL — ABNORMAL HIGH (ref 0–99)
Triglycerides: 126 mg/dL (ref 0–149)
VLDL Cholesterol Cal: 22 mg/dL (ref 5–40)

## 2019-08-19 LAB — CBC
Hematocrit: 39.1 % (ref 34.0–46.6)
Hemoglobin: 12.8 g/dL (ref 11.1–15.9)
MCH: 29.8 pg (ref 26.6–33.0)
MCHC: 32.7 g/dL (ref 31.5–35.7)
MCV: 91 fL (ref 79–97)
Platelets: 254 10*3/uL (ref 150–450)
RBC: 4.29 x10E6/uL (ref 3.77–5.28)
RDW: 13.3 % (ref 11.7–15.4)
WBC: 6.1 10*3/uL (ref 3.4–10.8)

## 2019-08-19 LAB — TSH: TSH: 2.22 u[IU]/mL (ref 0.450–4.500)

## 2019-08-22 ENCOUNTER — Ambulatory Visit (INDEPENDENT_AMBULATORY_CARE_PROVIDER_SITE_OTHER): Payer: Medicare HMO | Admitting: Surgical

## 2019-08-22 ENCOUNTER — Encounter: Payer: Self-pay | Admitting: Surgical

## 2019-08-22 ENCOUNTER — Other Ambulatory Visit: Payer: Self-pay

## 2019-08-22 VITALS — Ht 63.0 in | Wt 250.0 lb

## 2019-08-22 DIAGNOSIS — G5601 Carpal tunnel syndrome, right upper limb: Secondary | ICD-10-CM

## 2019-08-23 ENCOUNTER — Encounter: Payer: Self-pay | Admitting: Surgical

## 2019-08-23 NOTE — Progress Notes (Signed)
Post-Op Visit Note   Patient: [name]           Date of Birth: 1956/02/01           MRN: [id number] Visit Date: 08/22/2019 PCP: Mikey Kirschner, MD   Assessment & Plan:  Chief Complaint:  Chief Complaint  Patient presents with  . Right Hand - Follow-up    08/11/2019 Right CTR   Visit Diagnoses:  1. Carpal tunnel syndrome, right upper limb     Plan: Patient is a 64 year old female presents s/p right carpal tunnel release on 08/11/2019.  She presents for suture removal.  Sutures remain intact.  Incision looks good.  Sutures were all removed today.  Patient denies any pain.  She denies any numbness or tingling.  Steri-Strips were applied.  Recommended that patient hold off on any vigorous lifting of the next several weeks.  Follow-up in 3 weeks for clinical recheck.  No evidence of dehiscence, drainage, infection.  Recommended patient return sooner if she notices any signs of infection including increased redness, pain, fevers, chills, drainage.  Patient understands follow-up in 3 weeks.  Follow-Up Instructions: No follow-ups on file.   Orders:  No orders of the defined types were placed in this encounter.  No orders of the defined types were placed in this encounter.   Imaging: No results found.  PMFS History: Patient Active Problem List   Diagnosis Date Noted  . Upper airway cough syndrome 07/30/2018  . Morbid (severe) obesity due to excess calories ([place]) 07/30/2018  . Urinary, incontinence, stress female 05/15/2018  . Encounter for screening breast examination 05/15/2018  . Depression, major, single episode, moderate ([place]) 10/17/2017  . Hypothyroidism 09/19/2017  . Special screening for malignant neoplasms, colon   . Moody 12/30/2014  . Counseling for estrogen replacement therapy 12/30/2014  . 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 Mixed stress and urge urinary incontinence 02/10/2014  . Diabetes ([place]) 01/18/2014  . Posterior tibialis muscle dysfunction  10/24/2013  . Hyperlipidemia LDL goal <100 03/10/2013  . Superficial fungus infection of skin 07/05/2012  . Hot flashes 07/05/2012  . Rectocele 07/05/2012  . Essential hypertension 07/04/2012  . Hx of cervical cancer 07/04/2012  . Stiffness of joint, lower leg, left 08/28/2011  . Difficulty in walking(719.7) 08/28/2011  . Pain in left knee 08/28/2011   Past Medical History:  Diagnosis Date  . Arthritis    "fingers occasionally" (08/26/2014)  . Asthmatic bronchitis    "haven't used an inhaler in years" (08/26/2014)  . Cervical cancer ([place])   . Counseling for estrogen replacement therapy 12/30/2014  . Depression   . Diabetes mellitus without complication ([place])    type- 2  . Elevated cholesterol   . Exertional shortness of breath   . Family history of adverse reaction to anesthesia    "I think my mama gets sick"  . GERD (gastroesophageal reflux disease)   . Hot flashes 07/05/2012  . Hypertension   . Hypothyroidism   . IFG (impaired fasting glucose)   . Mixed stress and urge urinary incontinence   . Moody 12/30/2014  . Obesity   . Panniculus   . PONV (postoperative nausea and vomiting)   . Rectocele 07/05/2012  . Stress incontinence   . Superficial fungus infection of skin     Family History  Problem Relation Age of Onset  . Heart attack Father   . Stroke Mother   . Diabetes Brother   . Hypertension Brother   . Diabetes Brother   .  Hypertension Brother   . Diabetes Maternal Grandmother   . Cancer Maternal Aunt        cervical  . Diabetes Paternal Grandmother     Past Surgical History:  Procedure Laterality Date  . ABDOMINAL HYSTERECTOMY  1986   "partial"  . ANKLE FUSION Left 10/24/2013   Procedure: LEFT ANKLE SUBTALAR AND TALONAVICULAR FUSION;  Surgeon: Newt Minion, MD;  Location: [place];  Service: Orthopedics;  Laterality: Left;  . ANKLE FUSION Left 08/26/2014   Procedure: Left Foot Take Down Non-union with Revision Talonavicular and Subtalar Fusion;  Surgeon: Newt Minion, MD;  Location: [place];  Service: Orthopedics;  Laterality: Left;  . BACK SURGERY    . BILATERAL SALPINGECTOMY  2010   w/LOA  . COLONOSCOPY    . COLONOSCOPY N/A 07/14/2016   Procedure: COLONOSCOPY;  Surgeon: Danie Binder, MD;  [facility];  Service: Endoscopy;  Laterality: N/A;  8:30  . FUSION OF TALONAVICULAR JOINT Left    Take Down Non-union with Revision Talonavicular and Subtalar Fusion /notes 08/26/2014  . HAMMER TOE SURGERY Bilateral 2000-2013   right-left  . JOINT REPLACEMENT    . KNEE ARTHROSCOPY Bilateral 2008-2010    left-right  . LUMBAR DISC SURGERY  2004   Lumbar 4- 5  . POLYPECTOMY  07/14/2016   Procedure: POLYPECTOMY;  Surgeon: Danie Binder, MD;  [facility];  Service: Endoscopy;;  hepatic flexure  . SHOULDER ARTHROSCOPY WITH SUBACROMIAL DECOMPRESSION, ROTATOR CUFF REPAIR AND BICEP TENDON REPAIR Right 02/11/2019   Procedure: right shoulde arthroscopy, biceps tenodesis lower trapezius tendon transfer;  Surgeon: Meredith Pel, MD;  Location: [place];  Service: Orthopedics;  Laterality: Right;  . TOTAL KNEE ARTHROPLASTY Right 2013  . TUBAL LIGATION     Social History   Occupational History  . Not on file  Tobacco Use  . Smoking status: Former Smoker    Packs/day: 0.50    Years: 10.00    Pack years: 5.00    Types: Cigarettes    Quit date: 03/27/1988    Years since quitting: 31.4  . Smokeless tobacco: Never Used  Substance and Sexual Activity  . Alcohol use: Yes    Comment: occasionally a beer  . Drug use: No  . Sexual activity: Not Currently    Birth control/protection: Surgical    Comment: hyst

## 2019-08-24 ENCOUNTER — Encounter: Payer: Self-pay | Admitting: Orthopedic Surgery

## 2019-08-24 NOTE — Progress Notes (Signed)
Post-Op Visit Note   Patient: [name]           Date of Birth: Jan 11, 1956           MRN: [id number] Visit Date: 08/18/2019 PCP: Mikey Kirschner, MD   Assessment & Plan:  Chief Complaint:  Chief Complaint  Patient presents with  . Right Hand - Routine Post Op    08/11/2019 right CTR   Visit Diagnoses:  1. Carpal tunnel syndrome, right upper limb     Plan: [name] is a 64 year old patient right carpal tunnel release 08/11/2019.  Doing well with no pain or problems.  On exam incisions intact.  Abductor pollicis brevis drink is intact.  Come back on Friday for suture removal and I will see her back as needed.  Follow-Up Instructions: No follow-ups on file.   Orders:  No orders of the defined types were placed in this encounter.  No orders of the defined types were placed in this encounter.   Imaging: No results found.  PMFS History: Patient Active Problem List   Diagnosis Date Noted  . Upper airway cough syndrome 07/30/2018  . Morbid (severe) obesity due to excess calories ([place]) 07/30/2018  . Urinary, incontinence, stress female 05/15/2018  . Encounter for screening breast examination 05/15/2018  . Depression, major, single episode, moderate ([place]) 10/17/2017  . Hypothyroidism 09/19/2017  . Special screening for malignant neoplasms, colon   . Moody 12/30/2014  . Counseling for estrogen replacement therapy 12/30/2014  . 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 Mixed stress and urge urinary incontinence 02/10/2014  . Diabetes ([place]) 01/18/2014  . Posterior tibialis muscle dysfunction 10/24/2013  . Hyperlipidemia LDL goal <100 03/10/2013  . Superficial fungus infection of skin 07/05/2012  . Hot flashes 07/05/2012  . Rectocele 07/05/2012  . Essential hypertension 07/04/2012  . Hx of cervical cancer 07/04/2012  . Stiffness of joint, lower leg, left 08/28/2011  . Difficulty in walking(719.7) 08/28/2011  . Pain in left knee 08/28/2011   Past Medical History:    Diagnosis Date  . Arthritis    "fingers occasionally" (08/26/2014)  . Asthmatic bronchitis    "haven't used an inhaler in years" (08/26/2014)  . Cervical cancer ([place])   . Counseling for estrogen replacement therapy 12/30/2014  . Depression   . Diabetes mellitus without complication ([place])    type- 2  . Elevated cholesterol   . Exertional shortness of breath   . Family history of adverse reaction to anesthesia    "I think my mama gets sick"  . GERD (gastroesophageal reflux disease)   . Hot flashes 07/05/2012  . Hypertension   . Hypothyroidism   . IFG (impaired fasting glucose)   . Mixed stress and urge urinary incontinence   . Moody 12/30/2014  . Obesity   . Panniculus   . PONV (postoperative nausea and vomiting)   . Rectocele 07/05/2012  . Stress incontinence   . Superficial fungus infection of skin     Family History  Problem Relation Age of Onset  . Heart attack Father   . Stroke Mother   . Diabetes Brother   . Hypertension Brother   . Diabetes Brother   . Hypertension Brother   . Diabetes Maternal Grandmother   . Cancer Maternal Aunt        cervical  . Diabetes Paternal Grandmother     Past Surgical History:  Procedure Laterality Date  . ABDOMINAL HYSTERECTOMY  1986   "partial"  . ANKLE FUSION Left 10/24/2013  Procedure: LEFT ANKLE SUBTALAR AND TALONAVICULAR FUSION;  Surgeon: Newt Minion, MD;  Location: [place];  Service: Orthopedics;  Laterality: Left;  . ANKLE FUSION Left 08/26/2014   Procedure: Left Foot Take Down Non-union with Revision Talonavicular and Subtalar Fusion;  Surgeon: Newt Minion, MD;  Location: [place];  Service: Orthopedics;  Laterality: Left;  . BACK SURGERY    . BILATERAL SALPINGECTOMY  2010   w/LOA  . COLONOSCOPY    . COLONOSCOPY N/A 07/14/2016   Procedure: COLONOSCOPY;  Surgeon: Danie Binder, MD;  [facility];  Service: Endoscopy;  Laterality: N/A;  8:30  . FUSION OF TALONAVICULAR JOINT Left    Take Down Non-union with Revision  Talonavicular and Subtalar Fusion /notes 08/26/2014  . HAMMER TOE SURGERY Bilateral 2000-2013   right-left  . JOINT REPLACEMENT    . KNEE ARTHROSCOPY Bilateral 2008-2010    left-right  . LUMBAR DISC SURGERY  2004   Lumbar 4- 5  . POLYPECTOMY  07/14/2016   Procedure: POLYPECTOMY;  Surgeon: Danie Binder, MD;  [facility];  Service: Endoscopy;;  hepatic flexure  . SHOULDER ARTHROSCOPY WITH SUBACROMIAL DECOMPRESSION, ROTATOR CUFF REPAIR AND BICEP TENDON REPAIR Right 02/11/2019   Procedure: right shoulde arthroscopy, biceps tenodesis lower trapezius tendon transfer;  Surgeon: Meredith Pel, MD;  Location: [place];  Service: Orthopedics;  Laterality: Right;  . TOTAL KNEE ARTHROPLASTY Right 2013  . TUBAL LIGATION     Social History   Occupational History  . Not on file  Tobacco Use  . Smoking status: Former Smoker    Packs/day: 0.50    Years: 10.00    Pack years: 5.00    Types: Cigarettes    Quit date: 03/27/1988    Years since quitting: 31.4  . Smokeless tobacco: Never Used  Substance and Sexual Activity  . Alcohol use: Yes    Comment: occasionally a beer  . Drug use: No  . Sexual activity: Not Currently    Birth control/protection: Surgical    Comment: hyst

## 2019-08-29 ENCOUNTER — Other Ambulatory Visit: Payer: Self-pay | Admitting: Family Medicine

## 2019-09-04 ENCOUNTER — Telehealth: Payer: Self-pay | Admitting: Family Medicine

## 2019-09-04 NOTE — Telephone Encounter (Signed)
Pt returned call and states that she is taking the thyroid meds but has been out of it for a couple of days. Pt states if she is able to come off of her thyroid med, that would be fine with her. Pt also states that any other meds she can come off of, she will be willing to come off of. Please advise. Thank you.    [facility]

## 2019-09-04 NOTE — Telephone Encounter (Signed)
Seen 4/23 for hypothyroid and tsh done on 08/18/19 2.220. on 4/1 there was a med note added that she was not taking that med. Dr Richardson Landry refused refill. Called pt to see if she was taking med since a note was added on med list on 4/1 that she was not taking it.

## 2019-09-04 NOTE — Telephone Encounter (Signed)
[facility] sent over a refill request for NP THYROID 30 MG tablet last week and Pt said she has never received the meds. Chart shows Med not appropriate.

## 2019-09-04 NOTE — Telephone Encounter (Signed)
Left message to return call 

## 2019-09-05 MED ORDER — THYROID 30 MG PO TABS: ORAL_TABLET | ORAL | 2 refills | Status: DC

## 2019-09-05 NOTE — Addendum Note (Signed)
Addended by: Vicente Males on: 09/05/2019 03:11 PM   Modules accepted: Orders

## 2019-09-05 NOTE — Telephone Encounter (Signed)
Hi,   If pt has been on the thyroid medication at the last labs in 5/21, then I would recommend staying on it. Pls populate the refill if she needs one for the aurmor thyorid. Have pt f/u in 77months to recheck and discuss her meds.  Thx.   Dr. Lovena Le

## 2019-09-05 NOTE — Telephone Encounter (Signed)
Pt returned call and states that she will continue on Thyroid med. Refills sent to pharmacy

## 2019-09-05 NOTE — Telephone Encounter (Signed)
Left message to return call 

## 2019-09-12 ENCOUNTER — Ambulatory Visit: Payer: Medicare HMO | Admitting: Orthopedic Surgery

## 2019-09-12 ENCOUNTER — Ambulatory Visit: Payer: Medicare HMO | Admitting: Surgical

## 2019-09-12 ENCOUNTER — Encounter: Payer: Self-pay | Admitting: Surgical

## 2019-09-12 DIAGNOSIS — G5601 Carpal tunnel syndrome, right upper limb: Secondary | ICD-10-CM

## 2019-09-12 NOTE — Progress Notes (Signed)
Post-Op Visit Note   Patient: [name]           Date of Birth: [birth year]           MRN: [id number] Visit Date: 09/12/2019 PCP: Erven Colla, DO   Assessment & Plan:  Chief Complaint:  Chief Complaint  Patient presents with  . Right Hand - Follow-up   Visit Diagnoses: No diagnosis found.  Plan: Patient is a 64 year old female presents s/p right carpal tunnel release on 08/11/2019.  She notes that she is doing well and has no complaints aside from some continued swelling of the knuckles.  She states that this is improving with time out from the procedure.  She notes that numbness and tingling is all but resolved with only a little bit of tingling in the tips of her fingers when she squeezes something.  This too is improving.  Incision is healing well and no evidence of dehiscence or infection.  She denies taking any medication for pain.  Denies any fevers, chills, night sweats, malaise.  Patient is doing well following procedure.  Plan for patient to follow-up as needed.  Follow-Up Instructions: No follow-ups on file.   Orders:  No orders of the defined types were placed in this encounter.  No orders of the defined types were placed in this encounter.   Imaging: No results found.  PMFS History: Patient Active Problem List   Diagnosis Date Noted  . Upper airway cough syndrome 07/30/2018  . Morbid (severe) obesity due to excess calories ([place]) 07/30/2018  . Urinary, incontinence, stress female 05/15/2018  . Encounter for screening breast examination 05/15/2018  . Depression, major, single episode, moderate ([place]) 10/17/2017  . Hypothyroidism 09/19/2017  . Special screening for malignant neoplasms, colon   . Moody 12/30/2014  . Counseling for estrogen replacement therapy 12/30/2014  . 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 Mixed stress and urge urinary incontinence 02/10/2014  . Diabetes ([place]) 01/18/2014  . Posterior tibialis muscle dysfunction 10/24/2013  .  Hyperlipidemia LDL goal <100 03/10/2013  . Superficial fungus infection of skin 07/05/2012  . Hot flashes 07/05/2012  . Rectocele 07/05/2012  . Essential hypertension 07/04/2012  . Hx of cervical cancer 07/04/2012  . Stiffness of joint, lower leg, left 08/28/2011  . Difficulty in walking(719.7) 08/28/2011  . Pain in left knee 08/28/2011   Past Medical History:  Diagnosis Date  . Arthritis    "fingers occasionally" (08/26/2014)  . Asthmatic bronchitis    "haven't used an inhaler in years" (08/26/2014)  . Cervical cancer ([place])   . Counseling for estrogen replacement therapy 12/30/2014  . Depression   . Diabetes mellitus without complication ([place])    type- 2  . Elevated cholesterol   . Exertional shortness of breath   . Family history of adverse reaction to anesthesia    "I think my mama gets sick"  . GERD (gastroesophageal reflux disease)   . Hot flashes 07/05/2012  . Hypertension   . Hypothyroidism   . IFG (impaired fasting glucose)   . Mixed stress and urge urinary incontinence   . Moody 12/30/2014  . Obesity   . Panniculus   . PONV (postoperative nausea and vomiting)   . Rectocele 07/05/2012  . Stress incontinence   . Superficial fungus infection of skin     Family History  Problem Relation Age of Onset  . Heart attack Father   . Stroke Mother   . Diabetes Brother   . Hypertension Brother   .  Diabetes Brother   . Hypertension Brother   . Diabetes Maternal Grandmother   . Cancer Maternal Aunt        cervical  . Diabetes Paternal Grandmother     Past Surgical History:  Procedure Laterality Date  . ABDOMINAL HYSTERECTOMY  1986   "partial"  . ANKLE FUSION Left 10/24/2013   Procedure: LEFT ANKLE SUBTALAR AND TALONAVICULAR FUSION;  Surgeon: Newt Minion, MD;  Location: [facility];  Service: Orthopedics;  Laterality: Left;  . ANKLE FUSION Left 08/26/2014   Procedure: Left Foot Take Down Non-union with Revision Talonavicular and Subtalar Fusion;  Surgeon: Newt Minion, MD;   Location: [facility];  Service: Orthopedics;  Laterality: Left;  . BACK SURGERY    . BILATERAL SALPINGECTOMY  2010   w/LOA  . COLONOSCOPY    . COLONOSCOPY N/A 07/14/2016   Procedure: COLONOSCOPY;  Surgeon: Danie Binder, MD;  [facility];  Service: Endoscopy;  Laterality: N/A;  8:30  . FUSION OF TALONAVICULAR JOINT Left    Take Down Non-union with Revision Talonavicular and Subtalar Fusion /notes 08/26/2014  . HAMMER TOE SURGERY Bilateral 2000-2013   right-left  . JOINT REPLACEMENT    . KNEE ARTHROSCOPY Bilateral 2008-2010    left-right  . LUMBAR DISC SURGERY  2004   Lumbar 4- 5  . POLYPECTOMY  07/14/2016   Procedure: POLYPECTOMY;  Surgeon: Danie Binder, MD;  [facility];  Service: Endoscopy;;  hepatic flexure  . SHOULDER ARTHROSCOPY WITH SUBACROMIAL DECOMPRESSION, ROTATOR CUFF REPAIR AND BICEP TENDON REPAIR Right 02/11/2019   Procedure: right shoulde arthroscopy, biceps tenodesis lower trapezius tendon transfer;  Surgeon: Meredith Pel, MD;  Location: [facility];  Service: Orthopedics;  Laterality: Right;  . TOTAL KNEE ARTHROPLASTY Right 2013  . TUBAL LIGATION     Social History   Occupational History  . Not on file  Tobacco Use  . Smoking status: Former Smoker    Packs/day: 0.50    Years: 10.00    Pack years: 5.00    Types: Cigarettes    Quit date: 03/27/1988    Years since quitting: 31.4  . Smokeless tobacco: Never Used  Vaping Use  . Vaping Use: Never used  Substance and Sexual Activity  . Alcohol use: Yes    Comment: occasionally a beer  . Drug use: No  . Sexual activity: Not Currently    Birth control/protection: Surgical    Comment: hyst

## 2019-09-19 DIAGNOSIS — R69 Illness, unspecified: Secondary | ICD-10-CM | POA: Diagnosis not present

## 2019-10-03 ENCOUNTER — Ambulatory Visit (HOSPITAL_COMMUNITY)
Admission: RE | Admit: 2019-10-03 | Discharge: 2019-10-03 | Disposition: A | Discharge status: Home / Self Care | Payer: Medicare HMO | Source: Ambulatory Visit | Attending: Family Medicine | Admitting: Family Medicine

## 2019-10-03 ENCOUNTER — Other Ambulatory Visit: Payer: Self-pay

## 2019-10-03 ENCOUNTER — Ambulatory Visit (INDEPENDENT_AMBULATORY_CARE_PROVIDER_SITE_OTHER): Payer: Medicare HMO | Admitting: Family Medicine

## 2019-10-03 ENCOUNTER — Encounter: Payer: Self-pay | Admitting: Family Medicine

## 2019-10-03 VITALS — BP 146/80 | Temp 97.8°F | Wt 248.4 lb

## 2019-10-03 DIAGNOSIS — S20211A Contusion of right front wall of thorax, initial encounter: Secondary | ICD-10-CM | POA: Insufficient documentation

## 2019-10-03 DIAGNOSIS — S2241XA Multiple fractures of ribs, right side, initial encounter for closed fracture: Secondary | ICD-10-CM | POA: Diagnosis not present

## 2019-10-03 DIAGNOSIS — R1031 Right lower quadrant pain: Secondary | ICD-10-CM

## 2019-10-03 DIAGNOSIS — W01198A Fall on same level from slipping, tripping and stumbling with subsequent striking against other object, initial encounter: Secondary | ICD-10-CM

## 2019-10-03 NOTE — Progress Notes (Signed)
   Subjective:    Patient ID: [name], female    DOB: 1956/02/15, 64 y.o.   MRN: [id number]  HPI Patient comes in today after suffering a fall 2 weeks ago and reports a severley bruised back aching on right side, bruise on neck that's sore and a knot on the back of her head. No n or v No blurred Stayed down a few minutes She relates pain discomfort hurts with movement.  She relates significant soreness.  Hurts to take a deep breath.  Denies shortness of breath.  No fever or chills.  Review of Systems     Objective:   Physical Exam Tender in the right rib region not as much so on the left side.  Lungs are clear heart regular extremities no edema skin warm dry       Assessment & Plan:  Right rib contusion Need to rule out fracture Patient states Tylenol doing okay for her X-rays ordered Await results   Addendum has 3 rib fractures.  The area the [facility] joint I doubt is dislocated because patient not having pain in that area will be bringing patient back for recheck in 2 to 3 weeks

## 2019-10-10 ENCOUNTER — Other Ambulatory Visit: Payer: Self-pay | Admitting: Adult Health

## 2019-10-14 ENCOUNTER — Telehealth: Payer: Self-pay | Admitting: *Deleted

## 2019-10-14 MED ORDER — GLIPIZIDE 5 MG PO TABS: 5.0000 mg | ORAL_TABLET | ORAL | 0 refills | Status: DC

## 2019-10-14 MED ORDER — SIMVASTATIN 40 MG PO TABS: 40.0000 mg | ORAL_TABLET | Freq: Every day | ORAL | 1 refills | Status: DC

## 2019-10-14 NOTE — Telephone Encounter (Signed)
[facility] request [facility] Gl[facility]. Last visit 4/21.

## 2019-10-14 NOTE — Telephone Encounter (Signed)
And request refill on simvastatin.

## 2019-11-04 ENCOUNTER — Ambulatory Visit: Payer: Medicare HMO | Admitting: Family Medicine

## 2019-11-25 ENCOUNTER — Ambulatory Visit (INDEPENDENT_AMBULATORY_CARE_PROVIDER_SITE_OTHER): Payer: Medicare HMO | Admitting: Internal Medicine

## 2019-12-04 DIAGNOSIS — R69 Illness, unspecified: Secondary | ICD-10-CM | POA: Diagnosis not present

## 2019-12-10 ENCOUNTER — Other Ambulatory Visit: Payer: Self-pay | Admitting: Family Medicine

## 2019-12-10 DIAGNOSIS — E119 Type 2 diabetes mellitus without complications: Secondary | ICD-10-CM | POA: Diagnosis not present

## 2019-12-10 LAB — HM DIABETES EYE EXAM

## 2019-12-11 ENCOUNTER — Other Ambulatory Visit: Payer: Self-pay

## 2019-12-11 ENCOUNTER — Ambulatory Visit (INDEPENDENT_AMBULATORY_CARE_PROVIDER_SITE_OTHER): Payer: Medicare HMO | Admitting: Nurse Practitioner

## 2019-12-11 ENCOUNTER — Encounter (INDEPENDENT_AMBULATORY_CARE_PROVIDER_SITE_OTHER): Payer: Self-pay | Admitting: Nurse Practitioner

## 2019-12-11 VITALS — BP 132/74 | HR 75 | Temp 97.2°F | Resp 16 | Ht 63.0 in | Wt 246.4 lb

## 2019-12-11 DIAGNOSIS — M545 Low back pain, unspecified: Secondary | ICD-10-CM

## 2019-12-11 DIAGNOSIS — E119 Type 2 diabetes mellitus without complications: Secondary | ICD-10-CM | POA: Diagnosis not present

## 2019-12-11 DIAGNOSIS — E785 Hyperlipidemia, unspecified: Secondary | ICD-10-CM | POA: Diagnosis not present

## 2019-12-11 DIAGNOSIS — I10 Essential (primary) hypertension: Secondary | ICD-10-CM | POA: Diagnosis not present

## 2019-12-11 DIAGNOSIS — R69 Illness, unspecified: Secondary | ICD-10-CM | POA: Diagnosis not present

## 2019-12-11 DIAGNOSIS — E039 Hypothyroidism, unspecified: Secondary | ICD-10-CM | POA: Diagnosis not present

## 2019-12-11 DIAGNOSIS — F321 Major depressive disorder, single episode, moderate: Secondary | ICD-10-CM | POA: Diagnosis not present

## 2019-12-11 MED ORDER — SIMVASTATIN 40 MG PO TABS: 40.0000 mg | ORAL_TABLET | Freq: Every day | ORAL | 1 refills | Status: DC

## 2019-12-11 MED ORDER — ESCITALOPRAM OXALATE 5 MG PO TABS: ORAL_TABLET | ORAL | 0 refills | Status: DC

## 2019-12-11 NOTE — Progress Notes (Signed)
Subjective:  Patient ID: [name], female    DOB: Nov 22, 1955  Age: 64 y.o. MRN: [id number]  CC:  Chief Complaint  Patient presents with  . Establish Care      HPI  This patient arrives today for the above.  She tells me generally she is feeling well today she does request a refill on her Lexapro.  She tells me that she was placed on this previously to treat depression but she is interested in coming off this medication.  She tells me she is also been out of her simvastatin for a couple of weeks, and is wondering if she really needs to restart this.  She does mention to me that she is been having some intermittent tenderness on her tailbone.  She rates the pain as a 5/10.  She has been she first noted this about 1 month ago.  She did have a significant fall which resulted in bruising and broken ribs approximately 1 year ago and is wondering if this could be related to the pain she is experiencing in her tailbone now.  She denies any numbness, tingling, weakness to her lower extremities, she also denies saddle paresthesias and new or worsening urinary or bowel incontinence.  Past Medical History:  Diagnosis Date  . Arthritis    "fingers occasionally" (08/26/2014)  . Asthmatic bronchitis    "haven't used an inhaler in years" (08/26/2014)  . Cervical cancer ([place])   . Counseling for estrogen replacement therapy 12/30/2014  . Depression   . Diabetes mellitus without complication ([place])    type- 2  . Elevated cholesterol   . Exertional shortness of breath   . Family history of adverse reaction to anesthesia    "I think my mama gets sick"  . GERD (gastroesophageal reflux disease)   . Hot flashes 07/05/2012  . Hypertension   . Hypothyroidism   . IFG (impaired fasting glucose)   . Mixed stress and urge urinary incontinence   . Moody 12/30/2014  . Obesity   . Panniculus   . PONV (postoperative nausea and vomiting)   . Rectocele 07/05/2012  . Stress incontinence   . Superficial fungus  infection of skin       Family History  Problem Relation Age of Onset  . Heart attack Father        MI at age 40  . Stroke Mother   . Diabetes Brother   . Hypertension Brother   . Diabetes Brother   . Hypertension Brother   . Diabetes Maternal Grandmother   . Cancer Maternal Aunt        cervical  . Diabetes Paternal Grandmother   . Other Brother        Gastrointestional disease    Social History   Social History Narrative  . Not on file   Social History   Tobacco Use  . Smoking status: Former Smoker    Packs/day: 0.50    Years: 10.00    Pack years: 5.00    Types: Cigarettes    Quit date: 03/27/1988    Years since quitting: 31.7  . Smokeless tobacco: Never Used  Substance Use Topics  . Alcohol use: Yes    Comment: occasionally a beer     Current Meds  Medication Sig  . escitalopram (LEXAPRO) 5 MG tablet Take 1 tablet by mouth daily for 2 weeks; then take every other day for 1 week, then stop  . glipiZIDE (GLUCOTROL) 5 MG tablet Take 1-1.5 tablets (  5-7.5 mg total) by mouth See admin instructions. Take 1 tablet (5 mg) by mouth in the morning & 1.5 tablets (7.5 mg) by mouth in the evening.  Marland Kitchen glucose blood (ONETOUCH ULTRA) test strip USE TO TEST BLOOD SUGAR ONCE DAILY. FOR ICD 10 - E11.9  . metFORMIN (GLUCOPHAGE) 500 MG tablet Take 2 tablets by mouth twice daily  . nystatin (MYCOSTATIN/NYSTOP) powder APPLY 3 (THREE) TIMES DAILY TOPICALLY.  [name] Delica Lancets 71I MISC USE TO OBTAIN A BLOOD SPECIMEN TO TEST BLOOD SUGAR  . telmisartan-hydrochlorothiazide (MICARDIS HCT) 40-12.5 MG tablet Take 1 tablet by mouth daily.  Marland Kitchen thyroid (ARMOUR) 30 MG tablet Take 2 tablets (60 mg total) by mouth daily before breakfast.  . [DISCONTINUED] escitalopram (LEXAPRO) 10 MG tablet TAKE 1 TABLET BY MOUTH ONCE DAILY IN THE MORNING  . [DISCONTINUED] simvastatin (ZOCOR) 40 MG tablet Take 1 tablet (40 mg total) by mouth daily.    ROS:  Review of Systems  Constitutional: Negative.     Eyes: Negative.   Respiratory: Negative.   Cardiovascular: Negative.   Musculoskeletal: Positive for back pain and joint pain.  Neurological: Negative.   Psychiatric/Behavioral: Negative for depression.     Objective:   Today's Vitals: BP 132/74   Pulse 75   Temp (!) 97.2 F (36.2 C)   Resp 16   Ht $R'5\' 3"'TD$  (1.6 m)   Wt 246 lb 6.4 oz (111.8 kg)   SpO2 96%   BMI 43.65 kg/m  Vitals with BMI 12/11/2019 10/03/2019 08/22/2019  Height $Remov'5\' 3"'HxSgYo$  - $'5\' 3"'T$   Weight 246 lbs 6 oz 248 lbs 6 oz 250 lbs  BMI 96.78 - 93.8  Systolic 101 751 -  Diastolic 74 80 -  Pulse 75 - -     Physical Exam Vitals reviewed.  Constitutional:      General: She is not in acute distress.    Appearance: Normal appearance.  HENT:     Head: Normocephalic and atraumatic.  Neck:     Vascular: No carotid bruit.  Cardiovascular:     Rate and Rhythm: Normal rate and regular rhythm.     Pulses: Normal pulses.     Heart sounds: Normal heart sounds.  Pulmonary:     Effort: Pulmonary effort is normal.     Breath sounds: Normal breath sounds.  Musculoskeletal:     Lumbar back: Normal. No bony tenderness.  Skin:    General: Skin is warm and dry.  Neurological:     General: No focal deficit present.     Mental Status: She is alert and oriented to person, place, and time.     Sensory: Sensation is intact.     Motor: Motor function is intact.     Coordination: Coordination is intact.     Gait: Gait is intact.  Psychiatric:        Mood and Affect: Mood normal.        Behavior: Behavior normal.        Judgment: Judgment normal.          Assessment and Plan   1. Hyperlipidemia LDL goal <100   2. Depression, major, single episode, moderate ([place])   3. Acute low back pain without sciatica, unspecified back pain laterality   4. Essential hypertension   5. Hypothyroidism, unspecified type   6. Type 2 diabetes mellitus without complication, without long-term current use of insulin ([place])      Plan: 1.   Patient is not fasting today, she will return on  Monday to have fasting blood work taken.  We will refill her simvastatin today in the event her lipid panel indicates she needs to continue taking it. 2.  We discussed titrating off her Lexapro slowly.  I recommended she take 5 mg by mouth daily for 2 weeks, then 1 tablet by mouth every other day for a week and stop. 3.  Pain is mild to moderate in nature.  She not having any neurological symptoms, for now I recommended she treat with over-the-counter pain medicine including Tylenol and/or ibuprofen/Aleve.  If pain worsens we may consider further evaluation with imaging.  Patient is agreeable to plan. 4.  She will continue her antihypertensives as currently prescribed. 5.  We will check thyroid panel when she returns on Monday for lab work for further evaluation. 6.  We will check A1c on Monday when she returns for blood work.   Tests ordered Orders Placed This Encounter  Procedures  . CBC with Differential/Platelets  . CMP with eGFR([facility])  . Lipid Panel  . Hemoglobin A1c  . TSH  . T3, Free  . T4, Free      Meds ordered this encounter  Medications  . simvastatin (ZOCOR) 40 MG tablet    Sig: Take 1 tablet (40 mg total) by mouth daily.    Dispense:  90 tablet    Refill:  1    Order Specific Question:   Supervising Provider    Answer:   Hurshel Party C [[id number]]  . escitalopram (LEXAPRO) 5 MG tablet    Sig: Take 1 tablet by mouth daily for 2 weeks; then take every other day for 1 week, then stop    Dispense:  30 tablet    Refill:  0    Order Specific Question:   Supervising Provider    Answer:   Doree Albee [[id number]]    Patient to follow-up in 4 days for lab work and then again in 1 month for office visit.  Ailene Ards, NP

## 2019-12-15 ENCOUNTER — Other Ambulatory Visit (INDEPENDENT_AMBULATORY_CARE_PROVIDER_SITE_OTHER): Payer: Medicare HMO

## 2019-12-15 ENCOUNTER — Other Ambulatory Visit: Payer: Self-pay

## 2019-12-15 DIAGNOSIS — E039 Hypothyroidism, unspecified: Secondary | ICD-10-CM | POA: Diagnosis not present

## 2019-12-15 DIAGNOSIS — I10 Essential (primary) hypertension: Secondary | ICD-10-CM | POA: Diagnosis not present

## 2019-12-15 DIAGNOSIS — E119 Type 2 diabetes mellitus without complications: Secondary | ICD-10-CM | POA: Diagnosis not present

## 2019-12-15 DIAGNOSIS — R69 Illness, unspecified: Secondary | ICD-10-CM | POA: Diagnosis not present

## 2019-12-15 DIAGNOSIS — E785 Hyperlipidemia, unspecified: Secondary | ICD-10-CM | POA: Diagnosis not present

## 2019-12-15 DIAGNOSIS — M545 Low back pain: Secondary | ICD-10-CM | POA: Diagnosis not present

## 2019-12-16 LAB — COMPLETE METABOLIC PANEL WITH GFR
AG Ratio: 1.4 (calc) (ref 1.0–2.5)
ALT: 15 U/L (ref 6–29)
AST: 20 U/L (ref 10–35)
Albumin: 4.1 g/dL (ref 3.6–5.1)
Alkaline phosphatase (APISO): 58 U/L (ref 37–153)
BUN/Creatinine Ratio: 36 (calc) — ABNORMAL HIGH (ref 6–22)
BUN: 30 mg/dL — ABNORMAL HIGH (ref 7–25)
CO2: 27 mmol/L (ref 20–32)
Calcium: 9.5 mg/dL (ref 8.6–10.4)
Chloride: 101 mmol/L (ref 98–110)
Creat: 0.83 mg/dL (ref 0.50–0.99)
GFR, Est African American: 87 mL/min/{1.73_m2} (ref >=60)
GFR, Est Non African American: 75 mL/min/{1.73_m2} (ref >=60)
Globulin: 3 g/dL (calc) (ref 1.9–3.7)
Glucose, Bld: 149 mg/dL — ABNORMAL HIGH (ref 65–99)
Potassium: 4.9 mmol/L (ref 3.5–5.3)
Sodium: 136 mmol/L (ref 135–146)
Total Bilirubin: 0.4 mg/dL (ref 0.2–1.2)
Total Protein: 7.1 g/dL (ref 6.1–8.1)

## 2019-12-16 LAB — TSH: TSH: 2.09 mIU/L (ref 0.40–4.50)

## 2019-12-16 LAB — CBC WITH DIFFERENTIAL/PLATELET
Absolute Monocytes: 598 cells/uL (ref 200–950)
Basophils Absolute: 67 cells/uL (ref 0–200)
Basophils Relative: 1.1 %
Eosinophils Absolute: 427 cells/uL (ref 15–500)
Eosinophils Relative: 7 %
HCT: 38.7 % (ref 35.0–45.0)
Hemoglobin: 12.9 g/dL (ref 11.7–15.5)
Lymphs Abs: 2086 cells/uL (ref 850–3900)
MCH: 30.2 pg (ref 27.0–33.0)
MCHC: 33.3 g/dL (ref 32.0–36.0)
MCV: 90.6 fL (ref 80.0–100.0)
MPV: 9.8 fL (ref 7.5–12.5)
Monocytes Relative: 9.8 %
Neutro Abs: 2922 cells/uL (ref 1500–7800)
Neutrophils Relative %: 47.9 %
Platelets: 284 10*3/uL (ref 140–400)
RBC: 4.27 10*6/uL (ref 3.80–5.10)
RDW: 13.7 % (ref 11.0–15.0)
Total Lymphocyte: 34.2 %
WBC: 6.1 10*3/uL (ref 3.8–10.8)

## 2019-12-16 LAB — LIPID PANEL
Cholesterol: 244 mg/dL — ABNORMAL HIGH (ref <200)
HDL: 53 mg/dL (ref >=50)
LDL Cholesterol (Calc): 155 mg/dL (calc) — ABNORMAL HIGH
Non-HDL Cholesterol (Calc): 191 mg/dL (calc) — ABNORMAL HIGH (ref <130)
Total CHOL/HDL Ratio: 4.6 (calc) (ref <5.0)
Triglycerides: 199 mg/dL — ABNORMAL HIGH (ref <150)

## 2019-12-16 LAB — HEMOGLOBIN A1C
Hgb A1c MFr Bld: 6.5 % of total Hgb — ABNORMAL HIGH (ref <5.7)
Mean Plasma Glucose: 140 (calc)
eAG (mmol/L): 7.7 (calc)

## 2019-12-16 LAB — T3, FREE: T3, Free: 3 pg/mL (ref 2.3–4.2)

## 2019-12-16 LAB — T4, FREE: Free T4: 0.9 ng/dL (ref 0.8–1.8)

## 2019-12-17 ENCOUNTER — Other Ambulatory Visit: Payer: Self-pay | Admitting: Family Medicine

## 2019-12-17 ENCOUNTER — Other Ambulatory Visit (INDEPENDENT_AMBULATORY_CARE_PROVIDER_SITE_OTHER): Payer: Self-pay

## 2019-12-18 MED ORDER — METFORMIN HCL 500 MG PO TABS
ORAL_TABLET | ORAL | 0 refills | Status: DC
Start: 2019-12-18 — End: 2020-04-14

## 2019-12-30 ENCOUNTER — Other Ambulatory Visit: Payer: Self-pay | Admitting: *Deleted

## 2019-12-31 ENCOUNTER — Other Ambulatory Visit: Payer: Self-pay | Admitting: Family Medicine

## 2020-01-07 DIAGNOSIS — R69 Illness, unspecified: Secondary | ICD-10-CM | POA: Diagnosis not present

## 2020-01-09 ENCOUNTER — Other Ambulatory Visit: Payer: Self-pay | Admitting: Family Medicine

## 2020-01-13 ENCOUNTER — Other Ambulatory Visit: Payer: Self-pay | Admitting: Family Medicine

## 2020-01-21 ENCOUNTER — Ambulatory Visit (INDEPENDENT_AMBULATORY_CARE_PROVIDER_SITE_OTHER): Payer: Medicare HMO | Admitting: Internal Medicine

## 2020-01-21 ENCOUNTER — Encounter (INDEPENDENT_AMBULATORY_CARE_PROVIDER_SITE_OTHER): Payer: Self-pay | Admitting: Internal Medicine

## 2020-01-21 ENCOUNTER — Other Ambulatory Visit: Payer: Self-pay

## 2020-01-21 VITALS — BP 126/76 | HR 75 | Temp 97.3°F | Ht 63.0 in | Wt 245.0 lb

## 2020-01-21 DIAGNOSIS — E119 Type 2 diabetes mellitus without complications: Secondary | ICD-10-CM | POA: Diagnosis not present

## 2020-01-21 DIAGNOSIS — I10 Essential (primary) hypertension: Secondary | ICD-10-CM

## 2020-01-21 DIAGNOSIS — E039 Hypothyroidism, unspecified: Secondary | ICD-10-CM

## 2020-01-21 MED ORDER — ARMOUR THYROID 90 MG PO TABS: 90.0000 mg | ORAL_TABLET | Freq: Every day | ORAL | 3 refills | Status: DC

## 2020-01-21 NOTE — Patient Instructions (Signed)
[name] Dietary Recommendations for Weight Loss What to Avoid . Avoid added sugars o Often added sugar can be found in processed foods such as many condiments, dry cereals, cakes, cookies, chips, crisps, crackers, candies, sweetened drinks, etc.  o Read labels and AVOID/DECREASE use of foods with the following in their ingredient list: Sugar, fructose, high fructose corn syrup, sucrose, glucose, maltose, dextrose, molasses, cane sugar, brown sugar, any type of syrup, agave nectar, etc.   . Avoid snacking in between meals . Avoid foods made with flour o If you are going to eat food made with flour, choose those made with whole-grains; and, minimize your consumption as much as is tolerable . Avoid processed foods o These foods are generally stocked in the middle of the grocery store. Focus on shopping on the perimeter of the grocery.  . Avoid Meat  o We recommend following a plant-based diet at [name]. Thus, we recommend avoiding meat as a general rule. Consider eating beans, legumes, eggs, and/or dairy products for regular protein sources o If you plan on eating meat limit to 4 ounces of meat at a time and choose lean options such as Fish, chicken, turkey. Avoid red meat intake such as pork and/or steak What to Include . Vegetables o GREEN LEAFY VEGETABLES: Kale, spinach, mustard greens, collard greens, cabbage, broccoli, etc. o OTHER: Asparagus, cauliflower, eggplant, carrots, peas, Brussel sprouts, tomatoes, bell peppers, zucchini, beets, cucumbers, etc. . Grains, seeds, and legumes o Beans: kidney beans, black eyed peas, garbanzo beans, black beans, pinto beans, etc. o Whole, unrefined grains: brown rice, barley, bulgur, oatmeal, etc. . Healthy fats  o Avoid highly processed fats such as vegetable oil o Examples of healthy fats: avocado, olives, virgin olive oil, dark chocolate (?72% Cocoa), nuts (peanuts, almonds, walnuts, cashews, pecans, etc.) . None to Low  Intake of Animal Sources of Protein o Meat sources: chicken, turkey, salmon, tuna. Limit to 4 ounces of meat at one time. o Consider limiting dairy sources, but when choosing dairy focus on: PLAIN Greek yogurt, cottage cheese, high-protein milk . Fruit o Choose berries  When to Eat . Intermittent Fasting: o Choosing not to eat for a specific time period, but DO FOCUS ON HYDRATION when fasting o Multiple Techniques: - Time Restricted Eating: eat 3 meals in a day, each meal lasting no more than 60 minutes, no snacks between meals - 16-18 hour fast: fast for 16 to 18 hours up to 7 days a week. Often suggested to start with 2-3 nonconsecutive days per week.  . Remember the time you sleep is counted as fasting.  . Examples of eating schedule: Fast from 7:00pm-11:00am. Eat between 11:00am-7:00pm.  - 24-hour fast: fast for 24 hours up to every other day. Often suggested to start with 1 day per week . Remember the time you sleep is counted as fasting . Examples of eating schedule:  o Eating day: eat 2-3 meals on your eating day. If doing 2 meals, each meal should last no more than 90 minutes. If doing 3 meals, each meal should last no more than 60 minutes. Finish last meal by 7:00pm. o Fasting day: Fast until 7:00pm.  o IF YOU FEEL UNWELL FOR ANY REASON/IN ANY WAY WHEN FASTING, STOP FASTING BY EATING A NUTRITIOUS SNACK OR LIGHT MEAL o ALWAYS FOCUS ON HYDRATION DURING FASTS - Acceptable Hydration sources: water, broths, tea/coffee (black tea/coffee is best but using a small amount of whole-fat dairy products in coffee/tea is acceptable).  -   Poor Hydration Sources: anything with sugar or artificial sweeteners added to it  These recommendations have been developed for patients that are actively receiving medical care from either Dr. Renarda Mullinix or Sarah Gray, DNP, NP-C at [facility]. These recommendations are developed for patients with specific medical conditions and are not meant to be  distributed or used by others that are not actively receiving care from either provider listed above at [name]. It is not appropriate to participate in the above eating plans without proper medical supervision.   Reference: Fung, J. The obesity code. [place]: [place]; 2016.   

## 2020-01-21 NOTE — Progress Notes (Signed)
Metrics: Intervention Frequency ACO  Documented Smoking Status Yearly  Screened one or more times in 24 months  Cessation Counseling or  Active cessation medication Past 24 months  Past 24 months   Guideline developer: UpToDate (See UpToDate for funding source) Date Released: 2014       Wellness Office Visit  Subjective:  Patient ID: [name], female    DOB: 29-Apr-1955  Age: 64 y.o. MRN: [id number]  CC: This lady comes in for follow-up regarding her blood work and further recommendations. HPI  She has a history of morbid obesity, diabetes, hypothyroidism. She also describes hot flashes and virtually no sex drive since she had bilateral oophorectomy. She also has hypertension. I reviewed all the blood work with her which showed hemoglobin A1c of 6.5%.  Free T3 levels were suboptimal. Past Medical History:  Diagnosis Date  . Arthritis    "fingers occasionally" (08/26/2014)  . Asthmatic bronchitis    "haven't used an inhaler in years" (08/26/2014)  . Cervical cancer ([place])   . Counseling for estrogen replacement therapy 12/30/2014  . Depression   . Diabetes mellitus without complication ([place])    type- 2  . Elevated cholesterol   . Exertional shortness of breath   . Family history of adverse reaction to anesthesia    "I think my mama gets sick"  . GERD (gastroesophageal reflux disease)   . Hot flashes 07/05/2012  . Hypertension   . Hypothyroidism   . IFG (impaired fasting glucose)   . Mixed stress and urge urinary incontinence   . Moody 12/30/2014  . Obesity   . Panniculus   . PONV (postoperative nausea and vomiting)   . Rectocele 07/05/2012  . Stress incontinence   . Superficial fungus infection of skin    Past Surgical History:  Procedure Laterality Date  . ABDOMINAL HYSTERECTOMY  1986   "partial" Uterine cancer  . ANKLE FUSION Left 10/24/2013   Procedure: LEFT ANKLE SUBTALAR AND TALONAVICULAR FUSION;  Surgeon: Angelica Minion, MD;  Location: [place];  Service: Orthopedics;   Laterality: Left;  . ANKLE FUSION Left 08/26/2014   Procedure: Left Foot Take Down Non-union with Revision Talonavicular and Subtalar Fusion;  Surgeon: Angelica Minion, MD;  Location: [place];  Service: Orthopedics;  Laterality: Left;  . BACK SURGERY    . BILATERAL SALPINGECTOMY  2010   w/LOA  . COLONOSCOPY    . COLONOSCOPY N/A 07/14/2016   Procedure: COLONOSCOPY;  Surgeon: Angelica Binder, MD;  [facility];  Service: Endoscopy;  Laterality: N/A;  8:30  . FUSION OF TALONAVICULAR JOINT Left    Take Down Non-union with Revision Talonavicular and Subtalar Fusion /notes 08/26/2014  . HAMMER TOE SURGERY Bilateral 2000-2013   right-left  . JOINT REPLACEMENT     Right knee  . KNEE ARTHROSCOPY Bilateral 2008-2010    left-right  . LUMBAR DISC SURGERY  2004   Lumbar 4- 5  . POLYPECTOMY  07/14/2016   Procedure: POLYPECTOMY;  Surgeon: Angelica Binder, MD;  [facility];  Service: Endoscopy;;  hepatic flexure  . SHOULDER ARTHROSCOPY WITH SUBACROMIAL DECOMPRESSION, ROTATOR CUFF REPAIR AND BICEP TENDON REPAIR Right 02/11/2019   Procedure: right shoulde arthroscopy, biceps tenodesis lower trapezius tendon transfer;  Surgeon: Angelica Pel, MD;  Location: [place];  Service: Orthopedics;  Laterality: Right;  . TOTAL KNEE ARTHROPLASTY Right 2013  . TUBAL LIGATION       Family History  Problem Relation Age of Onset  . Heart attack  Father        MI at age 58  . Stroke Mother   . Diabetes Brother   . Hypertension Brother   . Diabetes Brother   . Hypertension Brother   . Diabetes Maternal Grandmother   . Cancer Maternal Aunt        cervical  . Diabetes Paternal Grandmother   . Other Brother        Gastrointestional disease    Social History   Social History Narrative   Married for 45 years.Lives with husband.On disability for foot problems.Previously worked for [employer].   Social History   Tobacco Use  . Smoking status: Former Smoker    Packs/day: 0.50    Years: 10.00     Pack years: 5.00    Types: Cigarettes    Quit date: 03/27/1988    Years since quitting: 31.8  . Smokeless tobacco: Never Used  Substance Use Topics  . Alcohol use: Yes    Comment: occasionally a beer    Current Meds  Medication Sig  . escitalopram (LEXAPRO) 5 MG tablet Take 1 tablet by mouth daily for 2 weeks; then take every other day for 1 week, then stop  . glipiZIDE (GLUCOTROL) 5 MG tablet Take 1-1.5 tablets (5-7.5 mg total) by mouth See admin instructions. Take 1 tablet (5 mg) by mouth in the morning & 1.5 tablets (7.5 mg) by mouth in the evening.  Marland Kitchen glucose blood (ONETOUCH ULTRA) test strip USE TO TEST BLOOD SUGAR ONCE DAILY. FOR ICD 10 - E11.9  . metFORMIN (GLUCOPHAGE) 500 MG tablet Take 2 tablets by mouth twice daily  . nystatin (MYCOSTATIN/NYSTOP) powder APPLY 3 (THREE) TIMES DAILY TOPICALLY.  [name] Lancets 28U MISC USE TO OBTAIN A BLOOD SPECIMEN TO TEST BLOOD SUGAR  . simvastatin (ZOCOR) 40 MG tablet Take 1 tablet (40 mg total) by mouth daily.  Marland Kitchen telmisartan-hydrochlorothiazide (MICARDIS HCT) 40-12.5 MG tablet Take 1 tablet by mouth daily.  . [DISCONTINUED] thyroid (ARMOUR THYROID) 60 MG tablet TAKE 1 TABLET BY MOUTH ONCE DAILY BEFORE BREAKFAST  . [DISCONTINUED] thyroid (ARMOUR) 30 MG tablet Take 2 tablets (60 mg total) by mouth daily before breakfast.      Depression screen [facility] 2/9 11/28/2018 05/15/2018 09/19/2017  Decreased Interest 0 0 3  Down, Depressed, Hopeless 0 0 1  PHQ - 2 Score 0 0 4  Altered sleeping - - 3  Tired, decreased energy - - 2  Change in appetite - - 2  Feeling bad or failure about yourself  - - 1  Trouble concentrating - - 2  Moving slowly or fidgety/restless - - 1  Suicidal thoughts - - 0  PHQ-9 Score - - 15     Objective:   Today's Vitals: BP 126/76   Pulse 75   Temp (!) 97.3 F (36.3 C) (Temporal)   Ht 5\' 3"  (1.6 m)   Wt 245 lb (111.1 kg)   SpO2 96%   BMI 43.40 kg/m  Vitals with BMI 01/21/2020 12/11/2019 10/03/2019  Height 5'  3" 5\' 3"  -  Weight 245 lbs 246 lbs 6 oz 248 lbs 6 oz  BMI 13.24 40.10 -  Systolic 272 536 644  Diastolic 76 74 80  Pulse 75 75 -     Physical Exam   She is morbidly obese.  Blood pressure is in good control.    Assessment   1. Type 2 diabetes mellitus without complication, without long-term current use of insulin ([place])   2. Hypothyroidism, unspecified  type   3. Essential hypertension   4. Morbid obesity ([place])       Tests ordered No orders of the defined types were placed in this encounter.    Plan: 1. She can continue with Metformin but glipizide will increase insulin levels and we discussed nutrition in detail with the concept of intermittent fasting combined with a plant-based diet, avoiding animal protein as much as possible and making sure she is hydrated with plenty of water every day.  If she can do this, we can discontinue glipizide fairly quickly. 2. Her hypothyroidism is not optimally controlled and I am going to increase the dose of Armour Thyroid to 90 mg daily.  I have sent a new prescription to reflect this. 3. When I see you the next visit and subsequent visit, we will discuss hormone replacement therapy which I think she will benefit from. 4. Follow-up in about 6 weeks.  Today I spent 40 minutes with this patient discussing all her blood work and nutrition as well as thyroid treatment.   Meds ordered this encounter  Medications  . ARMOUR THYROID 90 MG tablet    Sig: Take 1 tablet (90 mg total) by mouth daily.    Dispense:  30 tablet    Refill:  3    Sofia Vanmeter Luther Parody, MD

## 2020-02-03 ENCOUNTER — Ambulatory Visit (INDEPENDENT_AMBULATORY_CARE_PROVIDER_SITE_OTHER): Payer: Medicare HMO | Admitting: Nurse Practitioner

## 2020-02-03 ENCOUNTER — Encounter (INDEPENDENT_AMBULATORY_CARE_PROVIDER_SITE_OTHER): Payer: Self-pay | Admitting: Nurse Practitioner

## 2020-02-03 ENCOUNTER — Other Ambulatory Visit: Payer: Self-pay

## 2020-02-03 ENCOUNTER — Other Ambulatory Visit (INDEPENDENT_AMBULATORY_CARE_PROVIDER_SITE_OTHER): Payer: Self-pay | Admitting: Nurse Practitioner

## 2020-02-03 VITALS — BP 132/84 | HR 79 | Temp 96.4°F | Ht 63.0 in | Wt 248.0 lb

## 2020-02-03 DIAGNOSIS — H60502 Unspecified acute noninfective otitis externa, left ear: Secondary | ICD-10-CM | POA: Diagnosis not present

## 2020-02-03 DIAGNOSIS — F321 Major depressive disorder, single episode, moderate: Secondary | ICD-10-CM

## 2020-02-03 DIAGNOSIS — R21 Rash and other nonspecific skin eruption: Secondary | ICD-10-CM | POA: Diagnosis not present

## 2020-02-03 MED ORDER — NYSTATIN 100000 UNIT/GM EX POWD: 1.0000 "application " | Freq: Three times a day (TID) | CUTANEOUS | 2 refills | Status: DC

## 2020-02-03 MED ORDER — CIPRO HC 0.2-1 % OT SUSP: 3.0000 [drp] | Freq: Two times a day (BID) | OTIC | 0 refills | Status: DC

## 2020-02-03 NOTE — Progress Notes (Signed)
Subjective:  Patient ID: [name], female    DOB: [birth year]  Age: 64 y.o. MRN: [id number]  CC:  Chief Complaint  Patient presents with  . Ear Pain    Left ear pain, has bothered her for a while, recently started yesterday morning and sore to touch      HPI  This patient arrives today for an acute visit for the above.  She tells me she is been experiencing left ear pain since yesterday.  She denies any noticeable drainage.  She tells me it does sometimes itch and the pain radiates down to her jaw.  She has administered hydroperoxide to her ear without improvement in her symptoms.  She denies any fever.  She is wondering if she can have this evaluated.  She also mentions to me that she will sometimes note muffled hearing sounds and wonders what this could be a sign of.  She also mentions that in the areas where she sweats she has been experiencing some itching and is concerned she may have a growing fungal infection.  She has used nystatin powder in the past and would like a refill of this if possible.  Past Medical History:  Diagnosis Date  . Arthritis    "fingers occasionally" (08/26/2014)  . Asthmatic bronchitis    "haven't used an inhaler in years" (08/26/2014)  . Cervical cancer ([place])   . Counseling for estrogen replacement therapy 12/30/2014  . Depression   . Diabetes mellitus without complication ([place])    type- 2  . Elevated cholesterol   . Exertional shortness of breath   . Family history of adverse reaction to anesthesia    "I think my mama gets sick"  . GERD (gastroesophageal reflux disease)   . Hot flashes 07/05/2012  . Hypertension   . Hypothyroidism   . IFG (impaired fasting glucose)   . Mixed stress and urge urinary incontinence   . Moody 12/30/2014  . Obesity   . Panniculus   . PONV (postoperative nausea and vomiting)   . Rectocele 07/05/2012  . Stress incontinence   . Superficial fungus infection of skin       Family History  Problem Relation Age  of Onset  . Heart attack Father        MI at age 65  . Stroke Mother   . Diabetes Brother   . Hypertension Brother   . Diabetes Brother   . Hypertension Brother   . Diabetes Maternal Grandmother   . Cancer Maternal Aunt        cervical  . Diabetes Paternal Grandmother   . Other Brother        Gastrointestional disease    Social History   Social History Narrative   Married for 45 years.Lives with husband.On disability for foot problems.Previously worked for [employer].   Social History   Tobacco Use  . Smoking status: Former Smoker    Packs/day: 0.50    Years: 10.00    Pack years: 5.00    Types: Cigarettes    Quit date: 03/27/1988    Years since quitting: 31.8  . Smokeless tobacco: Never Used  Substance Use Topics  . Alcohol use: Yes    Comment: occasionally a beer     Current Meds  Medication Sig  . ARMOUR THYROID 90 MG tablet Take 1 tablet (90 mg total) by mouth daily.  Marland Kitchen glipiZIDE (GLUCOTROL) 5 MG tablet Take 1-1.5 tablets (5-7.5 mg total) by mouth See admin instructions. Take 1  tablet (5 mg) by mouth in the morning & 1.5 tablets (7.5 mg) by mouth in the evening.  Marland Kitchen glucose blood (ONETOUCH ULTRA) test strip USE TO TEST BLOOD SUGAR ONCE DAILY. FOR ICD 10 - E11.9  . metFORMIN (GLUCOPHAGE) 500 MG tablet Take 2 tablets by mouth twice daily  . nystatin (MYCOSTATIN/NYSTOP) powder APPLY 3 (THREE) TIMES DAILY TOPICALLY.  [name] Delica Lancets 94W MISC USE TO OBTAIN A BLOOD SPECIMEN TO TEST BLOOD SUGAR  . simvastatin (ZOCOR) 40 MG tablet Take 1 tablet (40 mg total) by mouth daily.  Marland Kitchen telmisartan-hydrochlorothiazide (MICARDIS HCT) 40-12.5 MG tablet Take 1 tablet by mouth daily.  . [DISCONTINUED] escitalopram (LEXAPRO) 5 MG tablet Take 1 tablet by mouth daily for 2 weeks; then take every other day for 1 week, then stop    ROS:  See HPI   Objective:   Today's Vitals: BP 132/84   Pulse 79   Temp (!) 96.4 F (35.8 C) (Temporal)   Ht 5\' 3"  (1.6 m)   Wt 248 lb (112.5 kg)    SpO2 94%   BMI 43.93 kg/m  Vitals with BMI 02/03/2020 01/21/2020 12/11/2019  Height 5\' 3"  5\' 3"  5\' 3"   Weight 248 lbs 245 lbs 246 lbs 6 oz  BMI 43.94 54.62 70.35  Systolic 009 381 829  Diastolic 84 76 74  Pulse 79 75 75     Physical Exam Vitals reviewed.  Constitutional:      Appearance: Normal appearance.  HENT:     Right Ear: Hearing, tympanic membrane, ear canal and external ear normal. There is no impacted cerumen. Tympanic membrane is not erythematous or bulging.     Left Ear: Hearing, tympanic membrane and external ear normal. Swelling and tenderness present. There is no impacted cerumen. Tympanic membrane is not erythematous or bulging.     Ears:     Comments: Redness and swelling noted inside the left ear canal Skin:      Neurological:     Mental Status: She is alert.  Psychiatric:        Attention and Perception: Attention normal.        Mood and Affect: Mood normal.        Speech: Speech normal.        Behavior: Behavior normal. Behavior is cooperative.        Thought Content: Thought content normal.          Assessment and Plan   1. Acute otitis externa of left ear, unspecified type   2. Rash      Plan: 1.  We will prescribe ciprofloxacin eardrops and she will apply 3 drops twice a day for 7 days.  She was told that if her symptoms worsen over the next couple of days or do not improve by early next week to notify me we can change to oral antibiotics.  She was encouraged to avoid administering any more hydrogen peroxide to her ear for the time being.  We also discussed that the muffled sounds she describes very well could be related to fluid trapped behind the eardrum.  I recommend that when this occurs she uses Flonase nasal spray for a week or more as needed as well as a daily over-the-counter allergy pill such as Claritin for a week or more as needed.  I told her that if she experiences pain in addition to the muffled noises that she should notify us for  further evaluation.  She tells me she understands. 2.  We  will refill nystatin powder that she can apply 3 times a day as needed for concerns of fungal infection.   Tests ordered No orders of the defined types were placed in this encounter.     Meds ordered this encounter  Medications  . ciprofloxacin-hydrocortisone (CIPRO HC) OTIC suspension    Sig: Place 3 drops into the left ear 2 (two) times daily.    Dispense:  10 mL    Refill:  0    Order Specific Question:   Supervising Provider    Answer:   Hurshel Party C [[id number]]  . nystatin (MYCOSTATIN/NYSTOP) powder    Sig: Apply 1 application topically 3 (three) times daily.    Dispense:  15 g    Refill:  2    Order Specific Question:   Supervising Provider    Answer:   Doree Albee [[id number]]    Patient to follow-up as scheduled next month, or sooner as needed.  Ailene Ards, NP

## 2020-02-04 ENCOUNTER — Telehealth (INDEPENDENT_AMBULATORY_CARE_PROVIDER_SITE_OTHER): Payer: Self-pay

## 2020-02-04 DIAGNOSIS — H60502 Unspecified acute noninfective otitis externa, left ear: Secondary | ICD-10-CM

## 2020-02-04 MED ORDER — OFLOXACIN 0.3 % OT SOLN: 5.0000 [drp] | Freq: Every day | OTIC | 0 refills | Status: AC

## 2020-02-04 NOTE — Telephone Encounter (Signed)
Called patient and LMOVM to return call  Called and left a detailed voice message with the message from Judson Roch for the patient.

## 2020-02-04 NOTE — Telephone Encounter (Signed)
Patient called and stated that the ear drops will be over $200 dollars and she can not afford these and is requesting a different prescription.  ciprofloxacin-hydrocortisone (CIPRO HC) OTIC suspension

## 2020-02-04 NOTE — Telephone Encounter (Signed)
I have prescribed other ear drops. I believe these are more affordable. She should instill 5 drops into the infected ear once a day for 7 days.

## 2020-02-17 DIAGNOSIS — K219 Gastro-esophageal reflux disease without esophagitis: Secondary | ICD-10-CM | POA: Diagnosis not present

## 2020-02-17 DIAGNOSIS — G8929 Other chronic pain: Secondary | ICD-10-CM | POA: Diagnosis not present

## 2020-02-17 DIAGNOSIS — R69 Illness, unspecified: Secondary | ICD-10-CM | POA: Diagnosis not present

## 2020-02-17 DIAGNOSIS — E039 Hypothyroidism, unspecified: Secondary | ICD-10-CM | POA: Diagnosis not present

## 2020-02-17 DIAGNOSIS — E785 Hyperlipidemia, unspecified: Secondary | ICD-10-CM | POA: Diagnosis not present

## 2020-02-17 DIAGNOSIS — I10 Essential (primary) hypertension: Secondary | ICD-10-CM | POA: Diagnosis not present

## 2020-02-17 DIAGNOSIS — E1165 Type 2 diabetes mellitus with hyperglycemia: Secondary | ICD-10-CM | POA: Diagnosis not present

## 2020-02-17 DIAGNOSIS — M1990 Unspecified osteoarthritis, unspecified site: Secondary | ICD-10-CM | POA: Diagnosis not present

## 2020-02-17 DIAGNOSIS — Z6841 Body Mass Index (BMI) 40.0 and over, adult: Secondary | ICD-10-CM | POA: Diagnosis not present

## 2020-03-10 ENCOUNTER — Encounter (INDEPENDENT_AMBULATORY_CARE_PROVIDER_SITE_OTHER): Payer: Self-pay | Admitting: Internal Medicine

## 2020-03-10 ENCOUNTER — Other Ambulatory Visit: Payer: Self-pay

## 2020-03-10 ENCOUNTER — Ambulatory Visit (INDEPENDENT_AMBULATORY_CARE_PROVIDER_SITE_OTHER): Payer: Medicare HMO | Admitting: Internal Medicine

## 2020-03-10 VITALS — BP 110/72 | HR 88 | Temp 96.9°F | Ht 63.0 in | Wt 248.4 lb

## 2020-03-10 DIAGNOSIS — I10 Essential (primary) hypertension: Secondary | ICD-10-CM | POA: Diagnosis not present

## 2020-03-10 DIAGNOSIS — E119 Type 2 diabetes mellitus without complications: Secondary | ICD-10-CM | POA: Diagnosis not present

## 2020-03-10 DIAGNOSIS — E039 Hypothyroidism, unspecified: Secondary | ICD-10-CM | POA: Diagnosis not present

## 2020-03-10 MED ORDER — NP THYROID 120 MG PO TABS: 120.0000 mg | ORAL_TABLET | Freq: Every day | ORAL | 3 refills | Status: DC

## 2020-03-10 NOTE — Progress Notes (Signed)
Metrics: Intervention Frequency ACO  Documented Smoking Status Yearly  Screened one or more times in 24 months  Cessation Counseling or  Active cessation medication Past 24 months  Past 24 months   Guideline developer: UpToDate (See UpToDate for funding source) Date Released: 2014       Wellness Office Visit  Subjective:  Patient ID: [name], female    DOB: [birth year]  Age: 64 y.o. MRN: [id number]  CC: This lady comes in for follow-up of hypothyroidism, hypertension, morbid obesity and diabetes. HPI  On the last visit that I saw her, we increased her Armour Thyroid to 90 mg daily.  She does not feel any different.  She also told me that the cost of the Armour Thyroid is very expensive on a monthly basis, more than $50 a month. She is trying very hard to make nutritional changes and she does intermittent fasting on most days of the week and is eating more of a plant-based diet now. Past Medical History:  Diagnosis Date  . Arthritis    "fingers occasionally" (08/26/2014)  . Asthmatic bronchitis    "haven't used an inhaler in years" (08/26/2014)  . Cervical cancer ([place])   . Counseling for estrogen replacement therapy 12/30/2014  . Depression   . Diabetes mellitus without complication ([place])    type- 2  . Elevated cholesterol   . Exertional shortness of breath   . Family history of adverse reaction to anesthesia    "I think my mama gets sick"  . GERD (gastroesophageal reflux disease)   . Hot flashes 07/05/2012  . Hypertension   . Hypothyroidism   . IFG (impaired fasting glucose)   . Mixed stress and urge urinary incontinence   . Moody 12/30/2014  . Obesity   . Panniculus   . PONV (postoperative nausea and vomiting)   . Rectocele 07/05/2012  . Stress incontinence   . Superficial fungus infection of skin    Past Surgical History:  Procedure Laterality Date  . ABDOMINAL HYSTERECTOMY  1986   "partial" Uterine cancer  . ANKLE FUSION Left 10/24/2013   Procedure: LEFT ANKLE  SUBTALAR AND TALONAVICULAR FUSION;  Surgeon: Newt Minion, MD;  Location: [place];  Service: Orthopedics;  Laterality: Left;  . ANKLE FUSION Left 08/26/2014   Procedure: Left Foot Take Down Non-union with Revision Talonavicular and Subtalar Fusion;  Surgeon: Newt Minion, MD;  Location: [place];  Service: Orthopedics;  Laterality: Left;  . BACK SURGERY    . BILATERAL SALPINGECTOMY  2010   w/LOA  . COLONOSCOPY    . COLONOSCOPY N/A 07/14/2016   Procedure: COLONOSCOPY;  Surgeon: Danie Binder, MD;  [facility];  Service: Endoscopy;  Laterality: N/A;  8:30  . FUSION OF TALONAVICULAR JOINT Left    Take Down Non-union with Revision Talonavicular and Subtalar Fusion /notes 08/26/2014  . HAMMER TOE SURGERY Bilateral 2000-2013   right-left  . JOINT REPLACEMENT     Right knee  . KNEE ARTHROSCOPY Bilateral 2008-2010    left-right  . LUMBAR DISC SURGERY  2004   Lumbar 4- 5  . POLYPECTOMY  07/14/2016   Procedure: POLYPECTOMY;  Surgeon: Danie Binder, MD;  [facility];  Service: Endoscopy;;  hepatic flexure  . SHOULDER ARTHROSCOPY WITH SUBACROMIAL DECOMPRESSION, ROTATOR CUFF REPAIR AND BICEP TENDON REPAIR Right 02/11/2019   Procedure: right shoulde arthroscopy, biceps tenodesis lower trapezius tendon transfer;  Surgeon: Meredith Pel, MD;  Location: [place];  Service: Orthopedics;  Laterality: Right;  .  TOTAL KNEE ARTHROPLASTY Right 2013  . TUBAL LIGATION       Family History  Problem Relation Age of Onset  . Heart attack Father        MI at age 71  . Stroke Mother   . Diabetes Brother   . Hypertension Brother   . Diabetes Brother   . Hypertension Brother   . Diabetes Maternal Grandmother   . Cancer Maternal Aunt        cervical  . Diabetes Paternal Grandmother   . Other Brother        Gastrointestional disease    Social History   Social History Narrative   Married for 45 years.Lives with husband.On disability for foot problems.Previously worked for [employer].    Social History   Tobacco Use  . Smoking status: Former Smoker    Packs/day: 0.50    Years: 10.00    Pack years: 5.00    Types: Cigarettes    Quit date: 03/27/1988    Years since quitting: 31.9  . Smokeless tobacco: Never Used  Substance Use Topics  . Alcohol use: Yes    Comment: occasionally a beer    Current Meds  Medication Sig  . glipiZIDE (GLUCOTROL) 5 MG tablet Take 1-1.5 tablets (5-7.5 mg total) by mouth See admin instructions. Take 1 tablet (5 mg) by mouth in the morning & 1.5 tablets (7.5 mg) by mouth in the evening.  Marland Kitchen glucose blood (ONETOUCH ULTRA) test strip USE TO TEST BLOOD SUGAR ONCE DAILY. FOR ICD 10 - E11.9  . metFORMIN (GLUCOPHAGE) 500 MG tablet Take 2 tablets by mouth twice daily  . nystatin (MYCOSTATIN/NYSTOP) powder Apply 1 application topically 3 (three) times daily.  [name] Delica Lancets 80D MISC USE TO OBTAIN A BLOOD SPECIMEN TO TEST BLOOD SUGAR  . simvastatin (ZOCOR) 40 MG tablet Take 1 tablet (40 mg total) by mouth daily.  Marland Kitchen telmisartan-hydrochlorothiazide (MICARDIS HCT) 40-12.5 MG tablet Take 1 tablet by mouth daily.  . [DISCONTINUED] ARMOUR THYROID 90 MG tablet Take 1 tablet (90 mg total) by mouth daily.      Depression screen [facility] 2/9 11/28/2018 05/15/2018 09/19/2017  Decreased Interest 0 0 3  Down, Depressed, Hopeless 0 0 1  PHQ - 2 Score 0 0 4  Altered sleeping - - 3  Tired, decreased energy - - 2  Change in appetite - - 2  Feeling bad or failure about yourself  - - 1  Trouble concentrating - - 2  Moving slowly or fidgety/restless - - 1  Suicidal thoughts - - 0  PHQ-9 Score - - 15     Objective:   Today's Vitals: BP 110/72   Pulse 88   Temp (!) 96.9 F (36.1 C) (Temporal)   Ht 5\' 3"  (1.6 m)   Wt 248 lb 6.4 oz (112.7 kg)   SpO2 95%   BMI 44.00 kg/m  Vitals with BMI 03/10/2020 02/03/2020 01/21/2020  Height 5\' 3"  5\' 3"  5\' 3"   Weight 248 lbs 6 oz 248 lbs 245 lbs  BMI 44.01 98.33 82.50  Systolic 539 767 341  Diastolic 72 84 76   Pulse 88 79 75     Physical Exam  She looks systemically well.  She has not lost any weight.  She remains morbidly obese.  Blood pressure is excellent.     Assessment   1. Hypothyroidism, unspecified type   2. Essential hypertension   3. Morbid obesity ([place])   4. Type 2 diabetes mellitus without complication, without  long-term current use of insulin ([place])       Tests ordered No orders of the defined types were placed in this encounter.    Plan: 1. I think we need to optimize thyroid further so I have sent a new prescription for NP thyroid 120 mg daily and I think this will be cheaper for also.  I have told her of possible side effects and how to deal with them. 2. She will continue with Micardis HCT for hypertension which seems to be well controlled. 3. She will continue with Metformin and glipizide for the time being but eventually we will need to change these medications also. 4. Follow-up in about 6 to 8 weeks to see how she is doing and we will likely check blood work then.   Meds ordered this encounter  Medications  . NP THYROID 120 MG tablet    Sig: Take 1 tablet (120 mg total) by mouth daily before breakfast.    Dispense:  30 tablet    Refill:  3    Leul Narramore Luther Parody, MD

## 2020-03-29 ENCOUNTER — Other Ambulatory Visit: Payer: Self-pay | Admitting: Family Medicine

## 2020-03-29 DIAGNOSIS — I10 Essential (primary) hypertension: Secondary | ICD-10-CM

## 2020-04-14 ENCOUNTER — Other Ambulatory Visit (INDEPENDENT_AMBULATORY_CARE_PROVIDER_SITE_OTHER): Payer: Self-pay | Admitting: Internal Medicine

## 2020-04-15 ENCOUNTER — Other Ambulatory Visit: Payer: Self-pay | Admitting: Family Medicine

## 2020-04-15 DIAGNOSIS — I10 Essential (primary) hypertension: Secondary | ICD-10-CM

## 2020-04-22 ENCOUNTER — Other Ambulatory Visit (INDEPENDENT_AMBULATORY_CARE_PROVIDER_SITE_OTHER): Payer: Self-pay

## 2020-04-22 ENCOUNTER — Other Ambulatory Visit (INDEPENDENT_AMBULATORY_CARE_PROVIDER_SITE_OTHER): Payer: Self-pay | Admitting: Internal Medicine

## 2020-04-22 DIAGNOSIS — E119 Type 2 diabetes mellitus without complications: Secondary | ICD-10-CM

## 2020-05-03 ENCOUNTER — Encounter (INDEPENDENT_AMBULATORY_CARE_PROVIDER_SITE_OTHER): Payer: Self-pay | Admitting: Internal Medicine

## 2020-05-03 ENCOUNTER — Other Ambulatory Visit: Payer: Self-pay

## 2020-05-03 ENCOUNTER — Ambulatory Visit (INDEPENDENT_AMBULATORY_CARE_PROVIDER_SITE_OTHER): Payer: Medicare HMO | Admitting: Internal Medicine

## 2020-05-03 VITALS — BP 130/80 | HR 72 | Ht 63.0 in | Wt 249.2 lb

## 2020-05-03 DIAGNOSIS — E785 Hyperlipidemia, unspecified: Secondary | ICD-10-CM

## 2020-05-03 DIAGNOSIS — R0600 Dyspnea, unspecified: Secondary | ICD-10-CM

## 2020-05-03 DIAGNOSIS — E039 Hypothyroidism, unspecified: Secondary | ICD-10-CM | POA: Diagnosis not present

## 2020-05-03 DIAGNOSIS — I10 Essential (primary) hypertension: Secondary | ICD-10-CM | POA: Diagnosis not present

## 2020-05-03 DIAGNOSIS — E119 Type 2 diabetes mellitus without complications: Secondary | ICD-10-CM | POA: Diagnosis not present

## 2020-05-03 NOTE — Progress Notes (Signed)
Metrics: Intervention Frequency ACO  Documented Smoking Status Yearly  Screened one or more times in 24 months  Cessation Counseling or  Active cessation medication Past 24 months  Past 24 months   Guideline developer: UpToDate (See UpToDate for funding source) Date Released: 2014       Wellness Office Visit  Subjective:  Patient ID: [name], female    DOB: Feb 19, 1956  Age: 65 y.o. MRN: [id number]  CC: This lady comes in for follow-up of diabetes, hypothyroidism, hypertension, dyslipidemia and obesity. HPI  Today, she tells me that she gets short of breath on exertion and on closer questioning it appears to be fairly minimal exertion.  She denies any PND or orthopnea.  She denies any specific chest discomfort or chest pain. She continues on glipizide and Metformin for diabetes. She continues on statin therapy for dyslipidemia in the face of diabetes. She continues with Micardis HCT for hypertension. Past Medical History:  Diagnosis Date  . Arthritis    "fingers occasionally" (08/26/2014)  . Asthmatic bronchitis    "haven't used an inhaler in years" (08/26/2014)  . Cervical cancer ([place])   . Counseling for estrogen replacement therapy 12/30/2014  . Depression   . Diabetes mellitus without complication ([place])    type- 2  . Elevated cholesterol   . Exertional shortness of breath   . Family history of adverse reaction to anesthesia    "I think my mama gets sick"  . GERD (gastroesophageal reflux disease)   . Hot flashes 07/05/2012  . Hypertension   . Hypothyroidism   . IFG (impaired fasting glucose)   . Mixed stress and urge urinary incontinence   . Moody 12/30/2014  . Obesity   . Panniculus   . PONV (postoperative nausea and vomiting)   . Rectocele 07/05/2012  . Stress incontinence   . Superficial fungus infection of skin    Past Surgical History:  Procedure Laterality Date  . ABDOMINAL HYSTERECTOMY  1986   "partial" Uterine cancer  . ANKLE FUSION Left 10/24/2013    Procedure: LEFT ANKLE SUBTALAR AND TALONAVICULAR FUSION;  Surgeon: Newt Minion, MD;  Location: [facility] [place];  Service: Orthopedics;  Laterality: Left;  . ANKLE FUSION Left 08/26/2014   Procedure: Left Foot Take Down Non-union with Revision Talonavicular and Subtalar Fusion;  Surgeon: Newt Minion, MD;  Location: [place];  Service: Orthopedics;  Laterality: Left;  . BACK SURGERY    . BILATERAL SALPINGECTOMY  2010   w/LOA  . COLONOSCOPY    . COLONOSCOPY N/A 07/14/2016   Procedure: COLONOSCOPY;  Surgeon: Danie Binder, MD;  [facility];  Service: Endoscopy;  Laterality: N/A;  8:30  . FUSION OF TALONAVICULAR JOINT Left    Take Down Non-union with Revision Talonavicular and Subtalar Fusion /notes 08/26/2014  . HAMMER TOE SURGERY Bilateral 2000-2013   right-left  . JOINT REPLACEMENT     Right knee  . KNEE ARTHROSCOPY Bilateral 2008-2010    left-right  . LUMBAR DISC SURGERY  2004   Lumbar 4- 5  . POLYPECTOMY  07/14/2016   Procedure: POLYPECTOMY;  Surgeon: Danie Binder, MD;  [facility];  Service: Endoscopy;;  hepatic flexure  . SHOULDER ARTHROSCOPY WITH SUBACROMIAL DECOMPRESSION, ROTATOR CUFF REPAIR AND BICEP TENDON REPAIR Right 02/11/2019   Procedure: right shoulde arthroscopy, biceps tenodesis lower trapezius tendon transfer;  Surgeon: Meredith Pel, MD;  Location: [facility];  Service: Orthopedics;  Laterality: Right;  . TOTAL KNEE ARTHROPLASTY Right 2013  . TUBAL  LIGATION       Family History  Problem Relation Age of Onset  . Heart attack Father        MI at age 64  . Stroke Mother   . Diabetes Brother   . Hypertension Brother   . Diabetes Brother   . Hypertension Brother   . Diabetes Maternal Grandmother   . Cancer Maternal Aunt        cervical  . Diabetes Paternal Grandmother   . Other Brother        Gastrointestional disease    Social History   Social History Narrative   Married for 45 years.Lives with husband.On disability for foot problems.Previously  worked for [employer].   Social History   Tobacco Use  . Smoking status: Former Smoker    Packs/day: 0.50    Years: 10.00    Pack years: 5.00    Types: Cigarettes    Quit date: 03/27/1988    Years since quitting: 32.1  . Smokeless tobacco: Never Used  Substance Use Topics  . Alcohol use: Yes    Comment: occasionally a beer    Current Meds  Medication Sig  . glipiZIDE (GLUCOTROL) 5 MG tablet TAKE 1 TABLET BY MOUTH EVERY MORNING AND 1&1/2 TABS EVERY EVENING  . metFORMIN (GLUCOPHAGE) 500 MG tablet Take 2 tablets by mouth twice daily  . NP THYROID 120 MG tablet Take 1 tablet (120 mg total) by mouth daily before breakfast.  . OneTouch Delica Lancets 33G MISC USE TO OBTAIN A BLOOD SPECIMEN TO TEST BLOOD SUGAR  . ONETOUCH ULTRA test strip USE TO TEST BLOOD SUGAR ONCE DAILY. FOR ICD 10 - E11.9  . simvastatin (ZOCOR) 40 MG tablet Take 1 tablet (40 mg total) by mouth daily.  Marland Kitchen. telmisartan-hydrochlorothiazide (MICARDIS HCT) 40-12.5 MG tablet TAKE 1 TABLET BY MOUTH EVERY DAY      [facility] 11/28/2018 05/15/2018 09/19/2017  Decreased Interest 0 0 3  Down, Depressed, Hopeless 0 0 1  PHQ - 2 Score 0 0 4  Altered sleeping - - 3  Tired, decreased energy - - 2  Change in appetite - - 2  Feeling bad or failure about yourself  - - 1  Trouble concentrating - - 2  Moving slowly or fidgety/restless - - 1  Suicidal thoughts - - 0  PHQ-9 Score - - 15  Some recent data might be hidden     Objective:   Today's Vitals: BP 130/80   Pulse 72   Ht 5\' 3"  (1.6 m)   Wt 249 lb 3.2 oz (113 kg)   BMI 44.14 kg/m  Vitals with BMI 05/03/2020 03/10/2020 02/03/2020  Height 5\' 3"  5\' 3"  5\' 3"   Weight 249 lbs 3 oz 248 lbs 6 oz 248 lbs  BMI 44.15 44.01 43.94  Systolic 130 110 161132  Diastolic 80 72 84  Pulse 72 88 79     Physical Exam   She looks systemically well and remains morbidly obese.  Blood pressure is acceptable today.  She has not lost any weight.    Assessment   1. Type 2 diabetes  mellitus without complication, without long-term current use of insulin (HCC)   2. Hypothyroidism, unspecified type   3. Essential hypertension   4. Hyperlipidemia LDL goal <100   5. Dyspnea, unspecified type       Tests ordered Orders Placed This Encounter  Procedures  . COMPLETE METABOLIC PANEL WITH GFR  . Hemoglobin A1c  . Lipid panel  .  T3, free  . TSH  . Ambulatory referral to Cardiology     Plan: 1. She will continue with glipizide and Metformin for diabetes and we will check an A1c. 2. She will continue with desiccated NP thyroid at a higher dose that she has tolerated and we will check thyroid function today. 3. She will continue with statin therapy and we will check lipid panel today. 4. I am somewhat concerned about her dyspnea on exertion which may represent cardiac symptoms and I will refer to cardiology for further evaluation.  At the least I think she will need a stress test of some sort. 5. Follow-up in 3 months and further recommendations will depend on blood results.   No orders of the defined types were placed in this encounter.   Doree Albee, MD

## 2020-05-04 LAB — LIPID PANEL
Cholesterol: 170 mg/dL (ref <200)
HDL: 47 mg/dL — ABNORMAL LOW (ref >=50)
LDL Cholesterol (Calc): 99 mg/dL (calc)
Non-HDL Cholesterol (Calc): 123 mg/dL (calc) (ref <130)
Total CHOL/HDL Ratio: 3.6 (calc) (ref <5.0)
Triglycerides: 144 mg/dL (ref <150)

## 2020-05-04 LAB — COMPLETE METABOLIC PANEL WITH GFR
AG Ratio: 1.4 (calc) (ref 1.0–2.5)
ALT: 17 U/L (ref 6–29)
AST: 18 U/L (ref 10–35)
Albumin: 4.1 g/dL (ref 3.6–5.1)
Alkaline phosphatase (APISO): 62 U/L (ref 37–153)
BUN: 19 mg/dL (ref 7–25)
CO2: 26 mmol/L (ref 20–32)
Calcium: 9.6 mg/dL (ref 8.6–10.4)
Chloride: 103 mmol/L (ref 98–110)
Creat: 0.69 mg/dL (ref 0.50–0.99)
GFR, Est African American: 107 mL/min/{1.73_m2} (ref >=60)
GFR, Est Non African American: 92 mL/min/{1.73_m2} (ref >=60)
Globulin: 2.9 g/dL (calc) (ref 1.9–3.7)
Glucose, Bld: 149 mg/dL — ABNORMAL HIGH (ref 65–139)
Potassium: 4.5 mmol/L (ref 3.5–5.3)
Sodium: 137 mmol/L (ref 135–146)
Total Bilirubin: 0.4 mg/dL (ref 0.2–1.2)
Total Protein: 7 g/dL (ref 6.1–8.1)

## 2020-05-04 LAB — T3, FREE: T3, Free: 3.3 pg/mL (ref 2.3–4.2)

## 2020-05-04 LAB — TSH: TSH: 0.23 mIU/L — ABNORMAL LOW (ref 0.40–4.50)

## 2020-05-04 LAB — HEMOGLOBIN A1C
Hgb A1c MFr Bld: 7.3 % of total Hgb — ABNORMAL HIGH (ref <5.7)
Mean Plasma Glucose: 163 mg/dL
eAG (mmol/L): 9 mmol/L

## 2020-05-17 ENCOUNTER — Encounter: Payer: Self-pay | Admitting: Cardiology

## 2020-05-17 ENCOUNTER — Ambulatory Visit: Payer: Medicare HMO | Admitting: Cardiology

## 2020-05-17 VITALS — BP 160/90 | HR 64 | Ht 63.0 in | Wt 251.6 lb

## 2020-05-17 DIAGNOSIS — I10 Essential (primary) hypertension: Secondary | ICD-10-CM | POA: Diagnosis not present

## 2020-05-17 DIAGNOSIS — R0602 Shortness of breath: Secondary | ICD-10-CM | POA: Diagnosis not present

## 2020-05-17 NOTE — Patient Instructions (Signed)

## 2020-05-17 NOTE — Progress Notes (Signed)
Clinical Summary Ms. Mansour is a 65 y.o.female seen as new consult, referred by Dr Anastasio Champion for the following medical issues  1. DOE - ongoing SOB for a few years, increasing - DOE with mopping, sweeping etc.  - no chest pains - no recent edema - some cough/wheezing though infrequent   CAD risk factors: DM2, HTN, HL, very remote short smoking history, father MI age 44 - prior foot surgery x 2, cannot run on treadmill.   2. HTN - compliant with meds - at recent pcp visit 130/80   3. Hyperlipidemia - followd by pcp, she is on statin     Past Medical History:  Diagnosis Date  . Arthritis    "fingers occasionally" (08/26/2014)  . Asthmatic bronchitis    "haven't used an inhaler in years" (08/26/2014)  . Cervical cancer ([place])   . Counseling for estrogen replacement therapy 12/30/2014  . Depression   . Diabetes mellitus without complication ([place])    type- 2  . Elevated cholesterol   . Exertional shortness of breath   . Family history of adverse reaction to anesthesia    "I think my mama gets sick"  . GERD (gastroesophageal reflux disease)   . Hot flashes 07/05/2012  . Hypertension   . Hypothyroidism   . IFG (impaired fasting glucose)   . Mixed stress and urge urinary incontinence   . Moody 12/30/2014  . Obesity   . Panniculus   . PONV (postoperative nausea and vomiting)   . Rectocele 07/05/2012  . Stress incontinence   . Superficial fungus infection of skin      Allergies  Allergen Reactions  . Naprosyn [Naproxen] Nausea Only    Can take Alleve but years ago patient took a liquid form of this medication and it made me nauseated     Current Outpatient Medications  Medication Sig Dispense Refill  . glipiZIDE (GLUCOTROL) 5 MG tablet TAKE 1 TABLET BY MOUTH EVERY MORNING AND 1&1/2 TABS EVERY EVENING 225 tablet 0  . metFORMIN (GLUCOPHAGE) 500 MG tablet Take 2 tablets by mouth twice daily 360 tablet 0  . NP THYROID 120 MG tablet Take 1 tablet (120 mg total) by mouth  daily before breakfast. 30 tablet 3  . OneTouch Delica Lancets 65K MISC USE TO OBTAIN A BLOOD SPECIMEN TO TEST BLOOD SUGAR 100 each 2  . ONETOUCH ULTRA test strip USE TO TEST BLOOD SUGAR ONCE DAILY. FOR ICD 10 - E11.9 100 strip 1  . simvastatin (ZOCOR) 40 MG tablet Take 1 tablet (40 mg total) by mouth daily. 90 tablet 1  . telmisartan-hydrochlorothiazide (MICARDIS HCT) 40-12.5 MG tablet TAKE 1 TABLET BY MOUTH EVERY DAY 90 tablet 0   No current facility-administered medications for this visit.     Past Surgical History:  Procedure Laterality Date  . ABDOMINAL HYSTERECTOMY  1986   "partial" Uterine cancer  . ANKLE FUSION Left 10/24/2013   Procedure: LEFT ANKLE SUBTALAR AND TALONAVICULAR FUSION;  Surgeon: Newt Minion, MD;  Location: [facility];  Service: Orthopedics;  Laterality: Left;  . ANKLE FUSION Left 08/26/2014   Procedure: Left Foot Take Down Non-union with Revision Talonavicular and Subtalar Fusion;  Surgeon: Newt Minion, MD;  Location: [facility];  Service: Orthopedics;  Laterality: Left;  . BACK SURGERY    . BILATERAL SALPINGECTOMY  2010   w/LOA  . COLONOSCOPY    . COLONOSCOPY N/A 07/14/2016   Procedure: COLONOSCOPY;  Surgeon: Danie Binder, MD;  [facility];  Service: Endoscopy;  Laterality: N/A;  8:30  . FUSION OF TALONAVICULAR JOINT Left    Take Down Non-union with Revision Talonavicular and Subtalar Fusion /notes 08/26/2014  . HAMMER TOE SURGERY Bilateral 2000-2013   right-left  . JOINT REPLACEMENT     Right knee  . KNEE ARTHROSCOPY Bilateral 2008-2010    left-right  . LUMBAR DISC SURGERY  2004   Lumbar 4- 5  . POLYPECTOMY  07/14/2016   Procedure: POLYPECTOMY;  Surgeon: Danie Binder, MD;  [facility];  Service: Endoscopy;;  hepatic flexure  . SHOULDER ARTHROSCOPY WITH SUBACROMIAL DECOMPRESSION, ROTATOR CUFF REPAIR AND BICEP TENDON REPAIR Right 02/11/2019   Procedure: right shoulde arthroscopy, biceps tenodesis lower trapezius tendon transfer;  Surgeon:  Meredith Pel, MD;  Location: [facility] [place];  Service: Orthopedics;  Laterality: Right;  . TOTAL KNEE ARTHROPLASTY Right 2013  . TUBAL LIGATION       Allergies  Allergen Reactions  . Naprosyn [Naproxen] Nausea Only    Can take Alleve but years ago patient took a liquid form of this medication and it made me nauseated      Family History  Problem Relation Age of Onset  . Heart attack Father        MI at age 25  . Stroke Mother   . Diabetes Brother   . Hypertension Brother   . Diabetes Brother   . Hypertension Brother   . Diabetes Maternal Grandmother   . Cancer Maternal Aunt        cervical  . Diabetes Paternal Grandmother   . Other Brother        Gastrointestional disease     Social History Ms. Hindes reports that she quit smoking about 32 years ago. Her smoking use included cigarettes. She has a 5.00 pack-year smoking history. She has never used smokeless tobacco. Ms. Klasen reports current alcohol use.   Review of Systems CONSTITUTIONAL: No weight loss, fever, chills, weakness or fatigue.  HEENT: Eyes: No visual loss, blurred vision, double vision or yellow sclerae.No hearing loss, sneezing, congestion, runny nose or sore throat.  SKIN: No rash or itching.  CARDIOVASCULAR: per hpi RESPIRATORY: per hpi GASTROINTESTINAL: No anorexia, nausea, vomiting or diarrhea. No abdominal pain or blood.  GENITOURINARY: No burning on urination, no polyuria NEUROLOGICAL: No headache, dizziness, syncope, paralysis, ataxia, numbness or tingling in the extremities. No change in bowel or bladder control.  MUSCULOSKELETAL: No muscle, back pain, joint pain or stiffness.  LYMPHATICS: No enlarged nodes. No history of splenectomy.  PSYCHIATRIC: No history of depression or anxiety.  ENDOCRINOLOGIC: No reports of sweating, cold or heat intolerance. No polyuria or polydipsia.  Marland Kitchen   Physical Examination Today's Vitals   05/17/20 1036  BP: (!) 160/90  Pulse: 64  SpO2: 98%  Weight: 251 lb 9.6 oz  (114.1 kg)  Height: 5\' 3"  (1.6 m)   Body mass index is 44.57 kg/m.  Gen: resting comfortably, no acute distress HEENT: no scleral icterus, pupils equal round and reactive, no palptable cervical adenopathy,  CV: RRR, no m/r/g, no jvd Resp: Clear to auscultation bilaterally GI: abdomen is soft, non-tender, non-distended, normal bowel sounds, no hepatosplenomegaly MSK: extremities are warm, no edema.  Skin: warm, no rash Neuro:  no focal deficits Psych: appropriate affect     Assessment and Plan  1. DOE/SOB - unclear etiology - will obtain echo to evaluate for underlying cardiac dysfunction - pending echo, given strong CAD risk factors likely lexiscan 2 day protocol after echo - EKG  today shows NSR  2. HTN - elevated today but has been at goal at other recent multiple clinical visits - continue to monitor.       Arnoldo Lenis, M.D

## 2020-05-27 ENCOUNTER — Other Ambulatory Visit: Payer: Self-pay

## 2020-05-27 ENCOUNTER — Ambulatory Visit (HOSPITAL_COMMUNITY)
Admission: RE | Admit: 2020-05-27 | Discharge: 2020-05-27 | Disposition: A | Discharge status: Home / Self Care | Payer: Medicare HMO | Source: Ambulatory Visit | Attending: Cardiology | Admitting: Cardiology

## 2020-05-27 DIAGNOSIS — R0602 Shortness of breath: Secondary | ICD-10-CM | POA: Diagnosis not present

## 2020-05-27 DIAGNOSIS — I10 Essential (primary) hypertension: Secondary | ICD-10-CM

## 2020-05-27 LAB — ECHOCARDIOGRAM COMPLETE
AR max vel: 1.39 cm2
AV Area VTI: 1.56 cm2
AV Area mean vel: 1.31 cm2
AV Mean grad: 7 mmHg
AV Peak grad: 14.5 mmHg
Ao pk vel: 1.91 m/s
Area-P 1/2: 3.08 cm2
S' Lateral: 2.85 cm

## 2020-05-27 NOTE — Progress Notes (Signed)
*  PRELIMINARY RESULTS* Echocardiogram 2D Echocardiogram has been performed.  [name] 05/27/2020, 2:33 PM

## 2020-05-28 ENCOUNTER — Telehealth: Payer: Self-pay | Admitting: Cardiology

## 2020-05-28 NOTE — Telephone Encounter (Signed)
Calling for Echo results  

## 2020-05-28 NOTE — Telephone Encounter (Signed)
Pt aware of echo results ./cy 

## 2020-06-08 ENCOUNTER — Telehealth: Payer: Self-pay | Admitting: *Deleted

## 2020-06-08 DIAGNOSIS — R0602 Shortness of breath: Secondary | ICD-10-CM

## 2020-06-08 NOTE — Telephone Encounter (Signed)
-----   Message from Arnoldo Lenis, MD sent at 06/07/2020  9:17 AM EDT ----- Echo shows normal heart pumping function. She does have some mild stiffness of the heart muscle which is common with aging and considered a minor change. Can she get a lexiscan 2 day protocol for sob,.   Zandra Abts MD

## 2020-06-08 NOTE — Telephone Encounter (Signed)
Pt voiced understanding and agreeable to stress test - orders placed, instructions given and will forward to schedulers

## 2020-06-09 ENCOUNTER — Telehealth: Payer: Self-pay | Admitting: Cardiology

## 2020-06-09 NOTE — Telephone Encounter (Signed)
Pre-cert Verification for the following procedure    LEXISCAN (2 DAY PROTOCOL)   DATE:  06/16/2020  LOCATION: [facility]

## 2020-06-16 ENCOUNTER — Encounter (HOSPITAL_COMMUNITY)
Admission: RE | Admit: 2020-06-16 | Discharge: 2020-06-16 | Disposition: A | Discharge status: Home / Self Care | Payer: Medicare HMO | Source: Ambulatory Visit | Attending: Cardiology | Admitting: Cardiology

## 2020-06-16 ENCOUNTER — Ambulatory Visit (HOSPITAL_COMMUNITY)
Admission: RE | Admit: 2020-06-16 | Discharge: 2020-06-16 | Disposition: A | Discharge status: Home / Self Care | Payer: Medicare HMO | Source: Ambulatory Visit | Attending: Cardiology | Admitting: Cardiology

## 2020-06-16 ENCOUNTER — Telehealth (INDEPENDENT_AMBULATORY_CARE_PROVIDER_SITE_OTHER): Payer: Self-pay

## 2020-06-16 ENCOUNTER — Ambulatory Visit (HOSPITAL_COMMUNITY): Payer: Medicare HMO

## 2020-06-16 DIAGNOSIS — R0602 Shortness of breath: Secondary | ICD-10-CM | POA: Diagnosis not present

## 2020-06-16 MED ORDER — SODIUM CHLORIDE FLUSH 0.9 % IV SOLN
INTRAVENOUS | Status: AC
  Administered 2020-06-16: 10 mL via INTRAVENOUS
  Filled 2020-06-16: qty 10

## 2020-06-16 MED ORDER — TECHNETIUM TC 99M TETROFOSMIN IV KIT
30.0000 | PACK | Freq: Once | INTRAVENOUS | Status: AC | PRN
  Administered 2020-06-16: 30 via INTRAVENOUS

## 2020-06-16 MED ORDER — REGADENOSON 0.4 MG/5ML IV SOLN
INTRAVENOUS | Status: AC
  Administered 2020-06-16: 0.4 mg via INTRAVENOUS
  Filled 2020-06-16: qty 5

## 2020-06-16 NOTE — Telephone Encounter (Signed)
Okay that sounds good.  If blood glucose levels are still over 200 when she checks it next time, she can take an extra dose of glipizide.  If blood sugar levels continue to be elevated, she will need an appointment for next week with either me or Sarah.

## 2020-06-16 NOTE — Telephone Encounter (Signed)
Patient had stress test today and she ate a chicken sandwich and fries and she checked her blood sugar a little bit after eating and it was over 400. Patient states she is drinking water and going to check BG again later today.

## 2020-06-16 NOTE — Telephone Encounter (Signed)
Called patient and gave her the message. Patient stated that she is having another heart test tomorrow. Patient verbalized an understanding and will call back if she needs to.

## 2020-06-17 ENCOUNTER — Encounter (HOSPITAL_COMMUNITY): Payer: Self-pay

## 2020-06-17 ENCOUNTER — Encounter (HOSPITAL_COMMUNITY)
Admission: RE | Admit: 2020-06-17 | Discharge: 2020-06-17 | Disposition: A | Discharge status: Home / Self Care | Payer: Medicare HMO | Source: Ambulatory Visit | Attending: Cardiology | Admitting: Cardiology

## 2020-06-17 LAB — NM MYOCAR MULTI W/SPECT W/WALL MOTION / EF
LV dias vol: 86 mL (ref 46–106)
LV sys vol: 38 mL
Peak HR: 104 {beats}/min
RATE: 0.29
Rest HR: 81 {beats}/min
SDS: 1
SRS: 0
SSS: 1
TID: 0.82

## 2020-06-17 MED ORDER — TECHNETIUM TC 99M TETROFOSMIN IV KIT
30.0000 | PACK | Freq: Once | INTRAVENOUS | Status: AC | PRN
  Administered 2020-06-17: 30.3 via INTRAVENOUS

## 2020-06-23 ENCOUNTER — Telehealth: Payer: Self-pay | Admitting: *Deleted

## 2020-06-23 NOTE — Telephone Encounter (Signed)
Pt voiced understanding - declined to make 4 month appt at this time - recall placed

## 2020-06-23 NOTE — Telephone Encounter (Signed)
-----   Message from Arnoldo Lenis, MD sent at 06/21/2020  4:08 PM EDT ----- Normal stress test. COmbined with her echo her heart testing shows that her heart is normal, nothing cardiac wise to explain her symptoms, needs to f/u with pcp to consider other causes, f/u 4 months with [place]  Angelica Abts MD

## 2020-07-14 ENCOUNTER — Ambulatory Visit: Payer: Medicare HMO | Admitting: Orthopedic Surgery

## 2020-07-14 ENCOUNTER — Ambulatory Visit (INDEPENDENT_AMBULATORY_CARE_PROVIDER_SITE_OTHER): Payer: Medicare HMO

## 2020-07-14 DIAGNOSIS — M25511 Pain in right shoulder: Secondary | ICD-10-CM | POA: Diagnosis not present

## 2020-07-15 ENCOUNTER — Other Ambulatory Visit: Payer: Self-pay | Admitting: Family Medicine

## 2020-07-15 DIAGNOSIS — I10 Essential (primary) hypertension: Secondary | ICD-10-CM

## 2020-07-18 ENCOUNTER — Encounter: Payer: Self-pay | Admitting: Orthopedic Surgery

## 2020-07-18 NOTE — Progress Notes (Signed)
Office Visit Note   Patient: [name]           Date of Birth: 1956/03/17           MRN: [id number] Visit Date: 07/14/2020 Requested by: Doree Albee, MD [place],  [place] [address] PCP: Doree Albee, MD  Subjective: Chief Complaint  Patient presents with  . Right Shoulder - Pain    HPI: [name] is a patient with right shoulder pain.  She underwent right shoulder arthroscopy with lower trapezius tendon transfer 02/11/2019.  Underwent right carpal tunnel release 08/11/2019.  Describes some radicular pain to the elbow.  Hard for her to pick things up.  She is doing home exercise program.              ROS: All systems reviewed are negative as they relate to the chief complaint within the history of present illness.  Patient denies  fevers or chills.   Assessment & Plan: Visit Diagnoses:  1. Right shoulder pain, unspecified chronicity     Plan: Impression is right shoulder pain with functional trapezius tendon transfer.  She does have some weakness and stiffness in the shoulder but I think as the stiffness improves her range of motion could also improve because of the functionality of that tendon transfer.  In general she has more motion than she had prior to surgery.  I think at this time there is nothing really "to do" about her shoulder but if she does get a little bit more flexibility I think that her pain will improve and her motion could widen.  Follow-up as needed.  She is going to work on home exercise program for self.  Follow-Up Instructions: Return if symptoms worsen or fail to improve.   Orders:  Orders Placed This Encounter  Procedures  . XR Shoulder Right   No orders of the defined types were placed in this encounter.     Procedures: No procedures performed   Clinical Data: No additional findings.  Objective: Vital Signs: There were no vitals taken for this visit.  Physical Exam:   Constitutional: Patient appears  well-developed HEENT:  Head: Normocephalic Eyes:EOM are normal Neck: Normal range of motion Cardiovascular: Normal rate Pulmonary/chest: Effort normal Neurologic: Patient is alert Skin: Skin is warm Psychiatric: Patient has normal mood and affect    Ortho Exam: Ortho exam demonstrates full active and passive range of motion of the left shoulder.  On the right-hand side she has forward flexion to about 100.  Passively I can get her to about 140.  Active isolated glenohumeral abduction is about 80.  I can get her up passively to about 100.  External rotation of 15 degrees with AB duction is about 50.  External rotation strength is 5- out of 5 on the right 5+ out of 5 on the left.  Trapezius tendon does fire with external rotation.  Specialty Comments:  No specialty comments available.  Imaging: No results found.   PMFS History: Patient Active Problem List   Diagnosis Date Noted  . Upper airway cough syndrome 07/30/2018  . Morbid (severe) obesity due to excess calories ([place]) 07/30/2018  . Urinary, incontinence, stress female 05/15/2018  . Encounter for screening breast examination 05/15/2018  . Depression, major, single episode, moderate ([place]) 10/17/2017  . Hypothyroidism 09/19/2017  . Special screening for malignant neoplasms, colon   . Moody 12/30/2014  . Counseling for estrogen replacement therapy 12/30/2014  . 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  Mixed stress and urge urinary incontinence 02/10/2014  . Diabetes ([place]) 01/18/2014  . Posterior tibialis muscle dysfunction 10/24/2013  . Hyperlipidemia LDL goal <100 03/10/2013  . Superficial fungus infection of skin 07/05/2012  . Hot flashes 07/05/2012  . Rectocele 07/05/2012  . Essential hypertension 07/04/2012  . Hx of cervical cancer 07/04/2012  . Stiffness of joint, lower leg, left 08/28/2011  . Difficulty in walking(719.7) 08/28/2011  . Pain in left knee 08/28/2011   Past Medical History:  Diagnosis Date  .  Arthritis    "fingers occasionally" (08/26/2014)  . Asthmatic bronchitis    "haven't used an inhaler in years" (08/26/2014)  . Cervical cancer ([place])   . Counseling for estrogen replacement therapy 12/30/2014  . Depression   . Diabetes mellitus without complication ([place])    type- 2  . Elevated cholesterol   . Exertional shortness of breath   . Family history of adverse reaction to anesthesia    "I think my mama gets sick"  . GERD (gastroesophageal reflux disease)   . Hot flashes 07/05/2012  . Hypertension   . Hypothyroidism   . IFG (impaired fasting glucose)   . Mixed stress and urge urinary incontinence   . Moody 12/30/2014  . Obesity   . Panniculus   . PONV (postoperative nausea and vomiting)   . Rectocele 07/05/2012  . Stress incontinence   . Superficial fungus infection of skin     Family History  Problem Relation Age of Onset  . Heart attack Father        MI at age 57  . Stroke Mother   . Diabetes Brother   . Hypertension Brother   . Diabetes Brother   . Hypertension Brother   . Diabetes Maternal Grandmother   . Cancer Maternal Aunt        cervical  . Diabetes Paternal Grandmother   . Other Brother        Gastrointestional disease    Past Surgical History:  Procedure Laterality Date  . ABDOMINAL HYSTERECTOMY  1986   "partial" Uterine cancer  . ANKLE FUSION Left 10/24/2013   Procedure: LEFT ANKLE SUBTALAR AND TALONAVICULAR FUSION;  Surgeon: Newt Minion, MD;  Location: [facility];  Service: Orthopedics;  Laterality: Left;  . ANKLE FUSION Left 08/26/2014   Procedure: Left Foot Take Down Non-union with Revision Talonavicular and Subtalar Fusion;  Surgeon: Newt Minion, MD;  Location: [facility] [place];  Service: Orthopedics;  Laterality: Left;  . BACK SURGERY    . BILATERAL SALPINGECTOMY  2010   w/LOA  . COLONOSCOPY    . COLONOSCOPY N/A 07/14/2016   Procedure: COLONOSCOPY;  Surgeon: Danie Binder, MD;  [facility];  Service: Endoscopy;  Laterality: N/A;  8:30  . FUSION OF  TALONAVICULAR JOINT Left    Take Down Non-union with Revision Talonavicular and Subtalar Fusion /notes 08/26/2014  . HAMMER TOE SURGERY Bilateral 2000-2013   right-left  . JOINT REPLACEMENT     Right knee  . KNEE ARTHROSCOPY Bilateral 2008-2010    left-right  . LUMBAR DISC SURGERY  2004   Lumbar 4- 5  . POLYPECTOMY  07/14/2016   Procedure: POLYPECTOMY;  Surgeon: Danie Binder, MD;  [facility];  Service: Endoscopy;;  hepatic flexure  . SHOULDER ARTHROSCOPY WITH SUBACROMIAL DECOMPRESSION, ROTATOR CUFF REPAIR AND BICEP TENDON REPAIR Right 02/11/2019   Procedure: right shoulde arthroscopy, biceps tenodesis lower trapezius tendon transfer;  Surgeon: Meredith Pel, MD;  Location: [facility];  Service: Orthopedics;  Laterality: Right;  . TOTAL KNEE ARTHROPLASTY Right 2013  . TUBAL LIGATION     Social History   Occupational History  . Not on file  Tobacco Use  . Smoking status: Former Smoker    Packs/day: 0.50    Years: 10.00    Pack years: 5.00    Types: Cigarettes    Quit date: 03/27/1988    Years since quitting: 32.3  . Smokeless tobacco: Never Used  Vaping Use  . Vaping Use: Never used  Substance and Sexual Activity  . Alcohol use: Yes    Comment: occasionally a beer  . Drug use: No  . Sexual activity: Not Currently    Birth control/protection: Surgical    Comment: hyst

## 2020-07-25 ENCOUNTER — Other Ambulatory Visit (INDEPENDENT_AMBULATORY_CARE_PROVIDER_SITE_OTHER): Payer: Self-pay | Admitting: Internal Medicine

## 2020-08-04 ENCOUNTER — Encounter (INDEPENDENT_AMBULATORY_CARE_PROVIDER_SITE_OTHER): Payer: Self-pay | Admitting: Internal Medicine

## 2020-08-04 ENCOUNTER — Other Ambulatory Visit: Payer: Self-pay

## 2020-08-04 ENCOUNTER — Ambulatory Visit (INDEPENDENT_AMBULATORY_CARE_PROVIDER_SITE_OTHER): Payer: Medicare HMO | Admitting: Internal Medicine

## 2020-08-04 VITALS — BP 130/74 | HR 73 | Temp 97.1°F | Ht 63.0 in | Wt 251.8 lb

## 2020-08-04 DIAGNOSIS — E119 Type 2 diabetes mellitus without complications: Secondary | ICD-10-CM

## 2020-08-04 DIAGNOSIS — I10 Essential (primary) hypertension: Secondary | ICD-10-CM

## 2020-08-04 DIAGNOSIS — E785 Hyperlipidemia, unspecified: Secondary | ICD-10-CM | POA: Diagnosis not present

## 2020-08-04 DIAGNOSIS — E039 Hypothyroidism, unspecified: Secondary | ICD-10-CM | POA: Diagnosis not present

## 2020-08-04 MED ORDER — TRULICITY 0.75 MG/0.5ML ~~LOC~~ SOAJ: 0.7500 mg | SUBCUTANEOUS | 3 refills | Status: DC

## 2020-08-04 MED ORDER — NP THYROID 60 MG PO TABS: 60.0000 mg | ORAL_TABLET | Freq: Every day | ORAL | 3 refills | Status: DC

## 2020-08-04 NOTE — Progress Notes (Signed)
Metrics: Intervention Frequency ACO  Documented Smoking Status Yearly  Screened one or more times in 24 months  Cessation Counseling or  Active cessation medication Past 24 months  Past 24 months   Guideline developer: UpToDate (See UpToDate for funding source) Date Released: 2014       Wellness Office Visit  Subjective:  Patient ID: [name], female    DOB: [birth year]  Age: 65 y.o. MRN: [id number]  CC: This lady comes in for follow-up of hypothyroidism, hypertension, diabetes and dyslipidemia. HPI  She has not been very consistent with nutrition and does not do intermittent fasting as we had discussed previously.  She feels that she is not losing weight, which is not too surprising based on her nutritional habits.  She feels tired still.  Her free T3 level was better but not optimal yet. She continues to take metformin and glipizide for diabetes. She continues on statin therapy. She continues on NP thyroid 120 mg in the morning for her hypothyroidism. Past Medical History:  Diagnosis Date  . Arthritis    "fingers occasionally" (08/26/2014)  . Asthmatic bronchitis    "haven't used an inhaler in years" (08/26/2014)  . Cervical cancer ([place])   . Counseling for estrogen replacement therapy 12/30/2014  . Depression   . Diabetes mellitus without complication ([place])    type- 2  . Elevated cholesterol   . Exertional shortness of breath   . Family history of adverse reaction to anesthesia    "I think my mama gets sick"  . GERD (gastroesophageal reflux disease)   . Hot flashes 07/05/2012  . Hypertension   . Hypothyroidism   . IFG (impaired fasting glucose)   . Mixed stress and urge urinary incontinence   . [place] 12/30/2014  . Obesity   . Panniculus   . PONV (postoperative nausea and vomiting)   . Rectocele 07/05/2012  . Stress incontinence   . Superficial fungus infection of skin    Past Surgical History:  Procedure Laterality Date  . ABDOMINAL HYSTERECTOMY  1986   "partial"  Uterine cancer  . ANKLE FUSION Left 10/24/2013   Procedure: LEFT ANKLE SUBTALAR AND TALONAVICULAR FUSION;  Surgeon: Newt Minion, MD;  Location: [place];  Service: Orthopedics;  Laterality: Left;  . ANKLE FUSION Left 08/26/2014   Procedure: Left Foot Take Down Non-union with Revision Talonavicular and Subtalar Fusion;  Surgeon: Newt Minion, MD;  Location: [place];  Service: Orthopedics;  Laterality: Left;  . BACK SURGERY    . BILATERAL SALPINGECTOMY  2010   w/LOA  . COLONOSCOPY    . COLONOSCOPY N/A 07/14/2016   Procedure: COLONOSCOPY;  Surgeon: Danie Binder, MD;  [facility];  Service: Endoscopy;  Laterality: N/A;  8:30  . FUSION OF TALONAVICULAR JOINT Left    Take Down Non-union with Revision Talonavicular and Subtalar Fusion /notes 08/26/2014  . HAMMER TOE SURGERY Bilateral 2000-2013   right-left  . JOINT REPLACEMENT     Right knee  . KNEE ARTHROSCOPY Bilateral 2008-2010    left-right  . LUMBAR DISC SURGERY  2004   Lumbar 4- 5  . POLYPECTOMY  07/14/2016   Procedure: POLYPECTOMY;  Surgeon: Danie Binder, MD;  [facility];  Service: Endoscopy;;  hepatic flexure  . SHOULDER ARTHROSCOPY WITH SUBACROMIAL DECOMPRESSION, ROTATOR CUFF REPAIR AND BICEP TENDON REPAIR Right 02/11/2019   Procedure: right shoulde arthroscopy, biceps tenodesis lower trapezius tendon transfer;  Surgeon: Meredith Pel, MD;  Location: [facility];  Service: Orthopedics;  Laterality: Right;  . TOTAL KNEE ARTHROPLASTY Right 2013  . TUBAL LIGATION       Family History  Problem Relation Age of Onset  . Heart attack Father        MI at age 56  . Stroke Mother   . Diabetes Brother   . Hypertension Brother   . Diabetes Brother   . Hypertension Brother   . Diabetes Maternal Grandmother   . Cancer Maternal Aunt        cervical  . Diabetes Paternal Grandmother   . Other Brother        Gastrointestional disease    Social History   Social History Narrative   Married for 45 years.Lives with  husband.On disability for foot problems.Previously worked for [employer].   Social History   Tobacco Use  . Smoking status: Former Smoker    Packs/day: 0.50    Years: 10.00    Pack years: 5.00    Types: Cigarettes    Quit date: 03/27/1988    Years since quitting: 32.3  . Smokeless tobacco: Never Used  Substance Use Topics  . Alcohol use: Yes    Comment: occasionally a beer    Current Meds  Medication Sig  . Dulaglutide (TRULICITY) 5.78 IO/9.6EX SOPN Inject 0.75 mg into the skin once a week.  Marland Kitchen glipiZIDE (GLUCOTROL) 5 MG tablet TAKE 1 TABLET BY MOUTH EVERY MORNING AND 1&1/2 TABS EVERY EVENING  . metFORMIN (GLUCOPHAGE) 500 MG tablet Take 2 tablets by mouth twice daily  . NP THYROID 120 MG tablet TAKE 1 TABLET(120 MG) BY MOUTH DAILY BEFORE BREAKFAST  . NP THYROID 60 MG tablet Take 1 tablet (60 mg total) by mouth daily before lunch.  [name] 52W MISC USE TO OBTAIN A BLOOD SPECIMEN TO TEST BLOOD SUGAR  . ONETOUCH ULTRA test strip USE TO TEST BLOOD SUGAR ONCE DAILY. FOR ICD 10 - E11.9  . simvastatin (ZOCOR) 40 MG tablet Take 1 tablet (40 mg total) by mouth daily.  Marland Kitchen telmisartan-hydrochlorothiazide (MICARDIS HCT) 40-12.5 MG tablet TAKE 1 TABLET BY MOUTH [place] Office Visit from 08/04/2020 in [facility]  PHQ-9 Total Score 0      Objective:   Today's Vitals: BP 130/74   Pulse 73   Temp (!) 97.1 F (36.2 C) (Temporal)   Ht 5\' 3"  (1.6 m)   Wt 251 lb 12.8 oz (114.2 kg)   SpO2 96%   BMI 44.60 kg/m  Vitals with BMI 08/04/2020 05/17/2020 05/03/2020  Height 5\' 3"  5\' 3"  5\' 3"   Weight 251 lbs 13 oz 251 lbs 10 oz 249 lbs 3 oz  BMI 44.62 41.32 44.01  Systolic 027 253 664  Diastolic 74 90 80  Pulse 73 64 72     Physical Exam   She remains morbidly obese.  She has not lost any weight since last time I saw her.  Her blood pressure however is better.   Assessment   1. Hypothyroidism, unspecified type   2. Essential hypertension   3. Type  2 diabetes mellitus without complication, without long-term current use of insulin ([place])   4. Hyperlipidemia LDL goal <100       Tests ordered No orders of the defined types were placed in this encounter.    Plan: 1. I think we can further optimize her thyroid so in addition to taking NP thyroid 120 mg in the morning, I wanted to take NP thyroid 60 mg at  around lunchtime. 2. Continue with Micardis HCT for her hypertension which is well controlled at the present time. 3. As far as her diabetes is concerned, I wanted to stop the glipizide, continue with the metformin and I am going to add Trulicity at a starting dose of 0.75 mg once a week. 4. For the time being, continue with statin therapy for her dyslipidemia in the face of diabetes. 5. Today I focused her back to intermittent fasting and emphasized how important this is to achieve success.  I also told her she needs to drink 1 gallon of water every day. 6. Follow-up in about 4 weeks to see how she is doing and we will most likely do blood work then.   Meds ordered this encounter  Medications  . Dulaglutide (TRULICITY) 8.93 TD/4.2AJ SOPN    Sig: Inject 0.75 mg into the skin once a week.    Dispense:  2 mL    Refill:  3  . NP THYROID 60 MG tablet    Sig: Take 1 tablet (60 mg total) by mouth daily before lunch.    Dispense:  30 tablet    Refill:  3    Kennidi Yoshida Luther Parody, MD

## 2020-08-07 ENCOUNTER — Other Ambulatory Visit: Payer: Self-pay | Admitting: Family Medicine

## 2020-08-07 DIAGNOSIS — I10 Essential (primary) hypertension: Secondary | ICD-10-CM

## 2020-08-09 ENCOUNTER — Other Ambulatory Visit (INDEPENDENT_AMBULATORY_CARE_PROVIDER_SITE_OTHER): Payer: Self-pay

## 2020-08-10 MED ORDER — TRULICITY 0.75 MG/0.5ML ~~LOC~~ SOAJ: 0.7500 mg | SUBCUTANEOUS | 3 refills | Status: DC

## 2020-08-10 NOTE — Telephone Encounter (Signed)
Called & she is checking now to see w/ insurance. She does not want to be in donut whole in 5 months.

## 2020-08-10 NOTE — Telephone Encounter (Signed)
Any injection is going to be lower cost; so she is definite walk & watch.

## 2020-08-10 NOTE — Telephone Encounter (Signed)
She said it would be too expensive on teir -3,  & golo to bring help she ask will you look on online.  Look in a teir 1-2 level.  Said she guess to start working on her diet & exercise.

## 2020-08-10 NOTE — Telephone Encounter (Signed)
Too exspensvie will need a alternative.

## 2020-08-17 ENCOUNTER — Other Ambulatory Visit (INDEPENDENT_AMBULATORY_CARE_PROVIDER_SITE_OTHER): Payer: Self-pay | Admitting: Nurse Practitioner

## 2020-08-17 DIAGNOSIS — E119 Type 2 diabetes mellitus without complications: Secondary | ICD-10-CM

## 2020-08-17 MED ORDER — METFORMIN HCL 500 MG PO TABS: 2.0000 | ORAL_TABLET | Freq: Two times a day (BID) | ORAL | 0 refills | Status: DC

## 2020-08-28 ENCOUNTER — Other Ambulatory Visit (INDEPENDENT_AMBULATORY_CARE_PROVIDER_SITE_OTHER): Payer: Self-pay | Admitting: Nurse Practitioner

## 2020-08-28 DIAGNOSIS — E785 Hyperlipidemia, unspecified: Secondary | ICD-10-CM

## 2020-09-02 ENCOUNTER — Other Ambulatory Visit: Payer: Self-pay | Admitting: Family Medicine

## 2020-09-02 DIAGNOSIS — I10 Essential (primary) hypertension: Secondary | ICD-10-CM

## 2020-09-09 ENCOUNTER — Encounter (INDEPENDENT_AMBULATORY_CARE_PROVIDER_SITE_OTHER): Payer: Self-pay | Admitting: Internal Medicine

## 2020-09-09 ENCOUNTER — Other Ambulatory Visit: Payer: Self-pay

## 2020-09-09 ENCOUNTER — Ambulatory Visit (INDEPENDENT_AMBULATORY_CARE_PROVIDER_SITE_OTHER): Payer: Medicare HMO | Admitting: Internal Medicine

## 2020-09-09 VITALS — BP 139/100 | HR 90 | Temp 97.1°F | Resp 18 | Ht 63.0 in | Wt 244.2 lb

## 2020-09-09 DIAGNOSIS — E039 Hypothyroidism, unspecified: Secondary | ICD-10-CM | POA: Diagnosis not present

## 2020-09-09 DIAGNOSIS — I10 Essential (primary) hypertension: Secondary | ICD-10-CM | POA: Diagnosis not present

## 2020-09-09 DIAGNOSIS — E119 Type 2 diabetes mellitus without complications: Secondary | ICD-10-CM | POA: Diagnosis not present

## 2020-09-09 DIAGNOSIS — E785 Hyperlipidemia, unspecified: Secondary | ICD-10-CM | POA: Diagnosis not present

## 2020-09-09 MED ORDER — TELMISARTAN-HCTZ 40-12.5 MG PO TABS
1.0000 | ORAL_TABLET | Freq: Every day | ORAL | 1 refills | Status: DC
Start: 2020-09-09 — End: 2021-04-22

## 2020-09-09 NOTE — Progress Notes (Signed)
Last wt 251 lb today 244 lb. Keep reading high level  CBG 135- 235 range. Been watching what  she eats. The insurance she is in the donut hole. Keeps her grand dt every day.

## 2020-09-09 NOTE — Progress Notes (Signed)
Metrics: Intervention Frequency ACO  Documented Smoking Status Yearly  Screened one or more times in 24 months  Cessation Counseling or  Active cessation medication Past 24 months  Past 24 months   Guideline developer: UpToDate (See UpToDate for funding source) Date Released: 2014       Wellness Office Visit  Subjective:  Patient ID: [name], female    DOB: Oct 02, 1955  Age: 65 y.o. MRN: [id number]  CC: This lady comes in for follow-up of diabetes, hypertension, hyperlipidemia and hypothyroidism. HPI  She has done very well in terms of nutrition and has managed to lose weight.  She discontinued glipizide on my recommendation and continues with metformin at the highest dose.  Unfortunately, Trulicity was unaffordable as was Rybelsus.  I have recommended that she look at her insurance and see what medications will be covered.  In the meantime, she will continue with nutritional adjustments. She has tolerated the higher dose of NP thyroid of 120 mg in the morning and 60 mg in the afternoon. She continues with antihypertensive medications for her hypertension Past Medical History:  Diagnosis Date   Arthritis    "fingers occasionally" (08/26/2014)   Asthmatic bronchitis    "haven't used an inhaler in years" (08/26/2014)   Cervical cancer ([place])    Counseling for estrogen replacement therapy 12/30/2014   Depression    Diabetes mellitus without complication ([facility])    type- 2   Elevated cholesterol    Exertional shortness of breath    Family history of adverse reaction to anesthesia    "I think my mama gets sick"   GERD (gastroesophageal reflux disease)    Hot flashes 07/05/2012   Hypertension    Hypothyroidism    IFG (impaired fasting glucose)    Mixed stress and urge urinary incontinence    Moody 12/30/2014   Obesity    Panniculus    PONV (postoperative nausea and vomiting)    Rectocele 07/05/2012   Stress incontinence    Superficial fungus infection of skin    Past Surgical  History:  Procedure Laterality Date   ABDOMINAL HYSTERECTOMY  1986   "partial" Uterine cancer   ANKLE FUSION Left 10/24/2013   Procedure: LEFT ANKLE SUBTALAR AND TALONAVICULAR FUSION;  Surgeon: Newt Minion, MD;  Location: [place];  Service: Orthopedics;  Laterality: Left;   ANKLE FUSION Left 08/26/2014   Procedure: Left Foot Take Down Non-union with Revision Talonavicular and Subtalar Fusion;  Surgeon: Newt Minion, MD;  Location: [place];  Service: Orthopedics;  Laterality: Left;   BACK SURGERY     BILATERAL SALPINGECTOMY  2010   w/LOA   COLONOSCOPY     COLONOSCOPY N/A 07/14/2016   Procedure: COLONOSCOPY;  Surgeon: Danie Binder, MD;  [facility];  Service: Endoscopy;  Laterality: N/A;  8:30   FUSION OF TALONAVICULAR JOINT Left    Take Down Non-union with Revision Talonavicular and Subtalar Fusion /notes 08/26/2014   HAMMER TOE SURGERY Bilateral 2000-2013   right-left   JOINT REPLACEMENT     Right knee   KNEE ARTHROSCOPY Bilateral 2008-2010    left-right   LUMBAR DISC SURGERY  2004   Lumbar 4- 5   POLYPECTOMY  07/14/2016   Procedure: POLYPECTOMY;  Surgeon: Danie Binder, MD;  [facility];  Service: Endoscopy;;  hepatic flexure   SHOULDER ARTHROSCOPY WITH SUBACROMIAL DECOMPRESSION, ROTATOR CUFF REPAIR AND BICEP TENDON REPAIR Right 02/11/2019   Procedure: right shoulde arthroscopy, biceps tenodesis lower trapezius tendon transfer;  Surgeon: Meredith Pel, MD;  Location: [place];  Service: Orthopedics;  Laterality: Right;   TOTAL KNEE ARTHROPLASTY Right 2013   TUBAL LIGATION       Family History  Problem Relation Age of Onset   Heart attack Father        MI at age 23   Stroke Mother    Diabetes Brother    Hypertension Brother    Diabetes Brother    Hypertension Brother    Diabetes Maternal Grandmother    Cancer Maternal Aunt        cervical   Diabetes Paternal Grandmother    Other Brother        Gastrointestional disease    Social History    Social History Narrative   Married for 45 years.Lives with husband.On disability for foot problems.Previously worked for [employer].   Social History   Tobacco Use   Smoking status: Former    Packs/day: 0.50    Years: 10.00    Pack years: 5.00    Types: Cigarettes    Quit date: 03/27/1988    Years since quitting: 32.4   Smokeless tobacco: Never  Substance Use Topics   Alcohol use: Yes    Comment: occasionally a beer    Current Meds  Medication Sig   chlorhexidine (PERIDEX) 0.12 % solution RINSE MOUTH WITH 15ML (1 CAPFUL) FOR 30 SECONDS IN MORNING AND EVENING AFTER BRUSHING, THEN SPIT   metFORMIN (GLUCOPHAGE) 500 MG tablet Take 2 tablets (1,000 mg total) by mouth 2 (two) times daily.   NP THYROID 120 MG tablet TAKE 1 TABLET(120 MG) BY MOUTH DAILY BEFORE BREAKFAST   NP THYROID 60 MG tablet Take 1 tablet (60 mg total) by mouth daily before lunch.   OneTouch Delica Lancets 77L MISC USE TO OBTAIN A BLOOD SPECIMEN TO TEST BLOOD SUGAR   ONETOUCH ULTRA test strip USE TO TEST BLOOD SUGAR ONCE DAILY. FOR ICD 10 - E11.9   simvastatin (ZOCOR) 40 MG tablet TAKE 1 TABLET BY MOUTH EVERY DAY   [DISCONTINUED] amoxicillin (AMOXIL) 875 MG tablet Take 1 tablet by mouth in the morning and at bedtime.   [DISCONTINUED] Dulaglutide (TRULICITY) 3.90 ZE/0.9QZ SOPN Inject 0.75 mg into the skin once a week.   [DISCONTINUED] glipiZIDE (GLUCOTROL) 5 MG tablet TAKE 1 TABLET BY MOUTH EVERY MORNING AND 1&1/2 TABS EVERY EVENING   [DISCONTINUED] telmisartan-hydrochlorothiazide (MICARDIS HCT) 40-12.5 MG tablet TAKE 1 TABLET BY MOUTH [place] Office Visit from 08/04/2020 in [facility]  PHQ-9 Total Score 0       Objective:   Today's Vitals: BP (!) 139/100 (BP Location: Left Arm, Patient Position: Sitting, Cuff Size: Large)   Pulse 90   Temp (!) 97.1 F (36.2 C) (Temporal)   Resp 18   Ht 5\' 3"  (1.6 m)   Wt 244 lb 3.2 oz (110.8 kg)   SpO2 94%   BMI 43.26 kg/m  Vitals with BMI  09/09/2020 08/04/2020 05/17/2020  Height 5\' 3"  5\' 3"  5\' 3"   Weight 244 lbs 3 oz 251 lbs 13 oz 251 lbs 10 oz  BMI 43.27 30.07 62.26  Systolic 333 545 625  Diastolic 638 74 90  Pulse 90 73 64     Physical Exam She looks systemically well.  She has lost 7 pounds in about the last 6 weeks.  Blood pressure somewhat elevated today but she is slightly stressed.  Examination of her feet did not show any evidence of peripheral neuropathy  with microfilament testing.      Assessment   1. Type 2 diabetes mellitus without complication, without long-term current use of insulin ([place])   2. Hypothyroidism, unspecified type   3. Essential hypertension   4. Hyperlipidemia LDL goal <100       Tests ordered Orders Placed This Encounter  Procedures   Basic metabolic panel   Hemoglobin A1c   T3, free   TSH      Plan: 1.  Continue with metformin and we will check an A1c.  She will let me know what medications are covered with insurance. 2.  Continue with the higher dose of NP thyroid and we will check thyroid function today. 3.  Continue with antihypertensive medication and I have sent a refill today.  Check electrolytes. 4.  I will see her in about 3 months for an annual physical exam.    Meds ordered this encounter  Medications   telmisartan-hydrochlorothiazide (MICARDIS HCT) 40-12.5 MG tablet    Sig: Take 1 tablet by mouth daily.    Dispense:  90 tablet    Refill:  1    Advise pt - needs appointment.     Doree Albee, MD

## 2020-09-10 LAB — BASIC METABOLIC PANEL
BUN: 14 mg/dL (ref 7–25)
CO2: 24 mmol/L (ref 20–32)
Calcium: 9.1 mg/dL (ref 8.6–10.4)
Chloride: 110 mmol/L (ref 98–110)
Creat: 0.54 mg/dL (ref 0.50–0.99)
Glucose, Bld: 141 mg/dL — ABNORMAL HIGH (ref 65–99)
Potassium: 3.7 mmol/L (ref 3.5–5.3)
Sodium: 141 mmol/L (ref 135–146)

## 2020-09-10 LAB — TSH: TSH: 0.06 mIU/L — ABNORMAL LOW (ref 0.40–4.50)

## 2020-09-10 LAB — HEMOGLOBIN A1C
Hgb A1c MFr Bld: 7.4 % of total Hgb — ABNORMAL HIGH (ref <5.7)
Mean Plasma Glucose: 166 mg/dL
eAG (mmol/L): 9.2 mmol/L

## 2020-09-10 LAB — T3, FREE: T3, Free: 3.1 pg/mL (ref 2.3–4.2)

## 2020-09-14 ENCOUNTER — Telehealth (INDEPENDENT_AMBULATORY_CARE_PROVIDER_SITE_OTHER): Payer: Self-pay

## 2020-09-14 NOTE — Telephone Encounter (Signed)
I have sent a MyChart message to the patient regarding her blood work.  She needs to look at this.  If she still wants to discuss the blood work, she needs to make an in office appointment.  I will not do virtual appointments for this question.

## 2020-09-14 NOTE — Telephone Encounter (Signed)
Ok will let her know.

## 2020-09-15 ENCOUNTER — Other Ambulatory Visit (INDEPENDENT_AMBULATORY_CARE_PROVIDER_SITE_OTHER): Payer: Self-pay | Admitting: Nurse Practitioner

## 2020-09-15 DIAGNOSIS — E785 Hyperlipidemia, unspecified: Secondary | ICD-10-CM

## 2020-09-15 MED ORDER — SIMVASTATIN 40 MG PO TABS: 40.0000 mg | ORAL_TABLET | Freq: Every day | ORAL | 0 refills | Status: DC

## 2020-10-11 ENCOUNTER — Ambulatory Visit: Payer: Medicare HMO | Admitting: Adult Health

## 2020-10-14 ENCOUNTER — Other Ambulatory Visit (INDEPENDENT_AMBULATORY_CARE_PROVIDER_SITE_OTHER): Payer: Self-pay | Admitting: Internal Medicine

## 2020-10-21 ENCOUNTER — Ambulatory Visit: Payer: Medicare HMO | Admitting: Orthopedic Surgery

## 2020-10-21 ENCOUNTER — Ambulatory Visit (INDEPENDENT_AMBULATORY_CARE_PROVIDER_SITE_OTHER): Payer: Medicare HMO

## 2020-10-21 ENCOUNTER — Other Ambulatory Visit: Payer: Self-pay

## 2020-10-21 ENCOUNTER — Encounter: Payer: Self-pay | Admitting: Orthopedic Surgery

## 2020-10-21 DIAGNOSIS — M79672 Pain in left foot: Secondary | ICD-10-CM

## 2020-10-21 DIAGNOSIS — M25872 Other specified joint disorders, left ankle and foot: Secondary | ICD-10-CM

## 2020-10-25 ENCOUNTER — Encounter: Payer: Self-pay | Admitting: Adult Health

## 2020-10-25 ENCOUNTER — Other Ambulatory Visit: Payer: Self-pay

## 2020-10-25 ENCOUNTER — Ambulatory Visit: Payer: Medicare HMO | Admitting: Adult Health

## 2020-10-25 VITALS — BP 129/64 | HR 72 | Ht 63.0 in | Wt 243.0 lb

## 2020-10-25 DIAGNOSIS — Z1231 Encounter for screening mammogram for malignant neoplasm of breast: Secondary | ICD-10-CM | POA: Insufficient documentation

## 2020-10-25 DIAGNOSIS — Z90710 Acquired absence of both cervix and uterus: Secondary | ICD-10-CM | POA: Diagnosis not present

## 2020-10-25 DIAGNOSIS — N3946 Mixed incontinence: Secondary | ICD-10-CM | POA: Diagnosis not present

## 2020-10-25 DIAGNOSIS — Z789 Other specified health status: Secondary | ICD-10-CM

## 2020-10-25 DIAGNOSIS — N816 Rectocele: Secondary | ICD-10-CM

## 2020-10-25 DIAGNOSIS — N8111 Cystocele, midline: Secondary | ICD-10-CM | POA: Diagnosis not present

## 2020-10-25 MED ORDER — OXYBUTYNIN CHLORIDE ER 10 MG PO TB24: 10.0000 mg | ORAL_TABLET | Freq: Every day | ORAL | 6 refills | Status: DC

## 2020-10-25 NOTE — Progress Notes (Signed)
  Subjective:     Patient ID: [name], female   DOB: 09/20/1955, 65 y.o.   MRN: [id number]  HPI [name] is a 65 year old white female, married, sp hysterectomy in complaining of something fall out, feels irritated, has urinary incontinence too, and can't lose weight. PCP is Dr Anastasio Champion.   Review of Systems +something falling out +urinary incontinence, with movement and urge   Reviewed past medical,surgical, social and family history. Reviewed medications and allergies.  Objective:   Physical Exam BP 129/64 (BP Location: Left Arm, Patient Position: Sitting, Cuff Size: Normal)   Pulse 72   Ht '5\' 3"'$  (1.6 m)   Wt 243 lb (110.2 kg)   BMI 43.05 kg/m  Skin warm and dry.Pelvic: external genitalia is normal in appearance no lesions, vagina: pale, mild midline cystocele and some pelvic relaxation at cuff,urethra has no lesions or masses noted, cervix and uterus are absent,adnexa: no masses or tenderness noted. Bladder is non tender and no masses felt. On rectal exam has +rectocele.   [place] - 10/25/20 1619       Pregnancy Intention Screening   Does the patient want to become pregnant in the next year? N/A    Does the patient's partner want to become pregnant in the next year? N/A    Would the patient like to discuss contraceptive options today? N/A      Contraception Wrap Up   Current Method --   hyst   End Method --   hyst   Contraception Counseling Provided No            Examination chaperoned by Celene Squibb LPN    Assessment:     1. Pelvic relaxation due to cystocele, midline Given medical explainer #4,can watch for now, if progresses may try pessary   2. Rectocele   3. Urinary incontinence, mixed Will try oxybutynin Meds ordered this encounter  Medications   oxybutynin (DITROPAN-XL) 10 MG 24 hr tablet    Sig: Take 1 tablet (10 mg total) by mouth at bedtime.    Dispense:  30 tablet    Refill:  6    Order Specific Question:   Supervising Provider    Answer:    Elonda Husky, LUTHER H [[id number]]     4. Screening mammogram for breast cancer Call for mammogram appt  5. Weight loss advised   6. S/P hysterectomy     Plan:     Follow up in 4 weeks, if doing well can cancel appt.

## 2020-11-16 ENCOUNTER — Encounter: Payer: Self-pay | Admitting: Orthopedic Surgery

## 2020-11-16 DIAGNOSIS — M25872 Other specified joint disorders, left ankle and foot: Secondary | ICD-10-CM

## 2020-11-16 MED ORDER — METHYLPREDNISOLONE ACETATE 40 MG/ML IJ SUSP
40.0000 mg | INTRAMUSCULAR | Status: AC | PRN
  Administered 2020-11-16: 40 mg via INTRA_ARTICULAR

## 2020-11-16 MED ORDER — LIDOCAINE HCL 1 % IJ SOLN
2.0000 mL | INTRAMUSCULAR | Status: AC | PRN
Start: 2020-11-16 — End: 2020-11-16
  Administered 2020-11-16: 2 mL

## 2020-11-16 NOTE — Progress Notes (Signed)
Office Visit Note   Patient: [name]           Date of Birth: 1955-09-17           MRN: [id number] Visit Date: 10/21/2020              Requested by: Doree Albee, MD No address on file PCP: Doree Albee, MD (Inactive)  Chief Complaint  Patient presents with   Left Foot - Pain    +HEEL PAIN      HPI:  Patient Is a 65 year old woman who presents Complaining of ankle and heel pain on the left.  She states that her toes are numb she stepped in a hole years ago.  She complains of sharp pain at the Achilles insertion and pain through the midfoot and ankle.   Assessment & Plan: Visit Diagnoses:  1. Pain in left foot   2. Pain of left heel   3. Ankle impingement syndrome, left     Plan: Plan for Achilles stretching this was demonstrated 5 times a day a minute at a time recommended a stiff soled walking sneaker to unload the metatarsal heads.  Follow-Up Instructions: Return if symptoms worsen or fail to improve.   Ortho Exam  Patient is alert, oriented, no adenopathy, well-dressed, normal affect, normal respiratory effort. Examination patient has good pulses.  She has pain to palpation across the metatarsal heads with Achilles contracture.  She has pain to palpation across the midfoot secondary to overloading from the Achilles contracture.  She has pain to palpation over the anterior joint line of the ankle consistent with impingement.  The Achilles has no palpable defect.  Imaging: No results found. No images are attached to the encounter.  Labs: Lab Results  Component Value Date   HGBA1C 7.4 (H) 09/09/2020   HGBA1C 7.3 (H) 05/03/2020   HGBA1C 6.5 (H) 12/15/2019   ESRSEDRATE 13 09/26/2016   REPTSTATUS 02/07/2019 FINAL 02/06/2019   CULT  02/06/2019    NO GROWTH Performed at [facility], [place] [address], [place], [place] [address]      Lab Results  Component Value Date   ALBUMIN 4.1 08/18/2019   ALBUMIN 4.6 10/17/2018   ALBUMIN 4.4 04/22/2018     No results found for: MG No results found for: VD25OH  No results found for: PREALBUMIN CBC EXTENDED Latest Ref Rng & Units 12/15/2019 08/18/2019 02/06/2019  WBC 3.8 - 10.8 Thousand/uL 6.1 6.1 8.5  RBC 3.80 - 5.10 Million/uL 4.27 4.29 4.27  HGB 11.7 - 15.5 g/dL 12.9 12.8 12.9  HCT 35.0 - 45.0 % 38.7 39.1 39.1  PLT 140 - 400 Thousand/uL 284 254 301  NEUTROABS 1,500 - 7,800 cells/uL 2,922 - -  LYMPHSABS 850 - 3,900 cells/uL 2,086 - -     There is no height or weight on file to calculate BMI.  Orders:  Orders Placed This Encounter  Procedures   XR Foot Complete Left   XR Os Calcis Left   No orders of the defined types were placed in this encounter.    Procedures: Medium Joint Inj: L ankle on 11/16/2020 11:39 AM Indications: pain and diagnostic evaluation Details: 22 G 1.5 in needle, anteromedial approach Medications: 2 mL lidocaine 1 %; 40 mg methylPREDNISolone acetate 40 MG/ML Outcome: tolerated well, no immediate complications Procedure, treatment alternatives, risks and benefits explained, specific risks discussed. Consent was given by the patient. Immediately prior to procedure a time out was called to verify the correct patient,  procedure, equipment, support staff and site/side marked as required. Patient was prepped and draped in the usual sterile fashion.     Clinical Data: No additional findings.  ROS:  All other systems negative, except as noted in the HPI. Review of Systems  Objective: Vital Signs: There were no vitals taken for this visit.  Specialty Comments:  No specialty comments available.  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, midline 10/25/2020   Urinary incontinence, mixed 10/25/2020   Weight loss advised 10/25/2020   Screening mammogram for breast cancer 10/25/2020   Upper airway cough syndrome 07/30/2018   Morbid (severe) obesity due to excess calories ([place]) 07/30/2018    Urinary, incontinence, stress female 05/15/2018   Encounter for screening breast examination 05/15/2018   Depression, major, single episode, moderate ([place]) 10/17/2017   Hypothyroidism 09/19/2017   Special screening for malignant neoplasms, colon    [place] 12/30/2014   Counseling for estrogen replacement therapy 12/30/2014   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   Mixed stress and urge urinary incontinence 02/10/2014   Diabetes ([place]) 01/18/2014   Posterior tibialis muscle dysfunction 10/24/2013   Hyperlipidemia LDL goal <100 03/10/2013   Superficial fungus infection of skin 07/05/2012   Hot flashes 07/05/2012   Rectocele 07/05/2012   Essential hypertension 07/04/2012   Hx of cervical cancer 07/04/2012   Stiffness of joint, lower leg, left 08/28/2011   Difficulty in walking(719.7) 08/28/2011   Pain in left knee 08/28/2011   Past Medical History:  Diagnosis Date   Arthritis    "fingers occasionally" (08/26/2014)   Asthmatic bronchitis    "haven't used an inhaler in years" (08/26/2014)   Cervical cancer ([place])    Counseling for estrogen replacement therapy 12/30/2014   Depression    Diabetes mellitus without complication (HCC)    type- 2   Elevated cholesterol    Exertional shortness of breath    Family history of adverse reaction to anesthesia    "I think my mama gets sick"   GERD (gastroesophageal reflux disease)    Hot flashes 07/05/2012   Hypertension    Hypothyroidism    IFG (impaired fasting glucose)    Mixed stress and urge urinary incontinence    [place] 12/30/2014   Obesity    Panniculus    PONV (postoperative nausea and vomiting)    Rectocele 07/05/2012   Stress incontinence    Superficial fungus infection of skin     Family History  Problem Relation Age of Onset   Heart attack Father        MI at age 19   Stroke Mother    Diabetes Brother    Hypertension Brother    Diabetes Brother    Hypertension Brother    Diabetes Maternal Grandmother    Cancer Maternal Aunt         cervical   Diabetes Paternal Grandmother    Other Brother        Gastrointestional disease    Past Surgical History:  Procedure Laterality Date   ABDOMINAL HYSTERECTOMY  1986   "partial" Uterine cancer   ANKLE FUSION Left 10/24/2013   Procedure: LEFT ANKLE SUBTALAR AND TALONAVICULAR FUSION;  Surgeon: Newt Minion, MD;  Location: [facility];  Service: Orthopedics;  Laterality: Left;   ANKLE FUSION Left 08/26/2014   Procedure: Left Foot Take Down Non-union with Revision Talonavicular and Subtalar Fusion;  Surgeon: Newt Minion, MD;  Location: [facility];  Service: Orthopedics;  Laterality: Left;   BACK SURGERY     BILATERAL SALPINGECTOMY  2010   w/LOA   COLONOSCOPY     COLONOSCOPY N/A 07/14/2016   Procedure: COLONOSCOPY;  Surgeon: Danie Binder, MD;  [facility];  Service: Endoscopy;  Laterality: N/A;  8:30   FUSION OF TALONAVICULAR JOINT Left    Take Down Non-union with Revision Talonavicular and Subtalar Fusion /notes 08/26/2014   HAMMER TOE SURGERY Bilateral 2000-2013   right-left   JOINT REPLACEMENT     Right knee   KNEE ARTHROSCOPY Bilateral 2008-2010    left-right   LUMBAR DISC SURGERY  2004   Lumbar 4- 5   POLYPECTOMY  07/14/2016   Procedure: POLYPECTOMY;  Surgeon: Danie Binder, MD;  [facility];  Service: Endoscopy;;  hepatic flexure   SHOULDER ARTHROSCOPY WITH SUBACROMIAL DECOMPRESSION, ROTATOR CUFF REPAIR AND BICEP TENDON REPAIR Right 02/11/2019   Procedure: right shoulde arthroscopy, biceps tenodesis lower trapezius tendon transfer;  Surgeon: Meredith Pel, MD;  Location: [facility];  Service: Orthopedics;  Laterality: Right;   TOTAL KNEE ARTHROPLASTY Right 2013   TUBAL LIGATION     Social History   Occupational History   Not on file  Tobacco Use   Smoking status: Former    Packs/day: 0.50    Years: 10.00    Pack years: 5.00    Types: Cigarettes    Quit date: 03/27/1988    Years since quitting: 32.6   Smokeless tobacco: Never  Vaping Use    Vaping Use: Never used  Substance and Sexual Activity   Alcohol use: Yes    Comment: occasionally a beer   Drug use: No   Sexual activity: Not Currently    Birth control/protection: Surgical    Comment: hyst

## 2020-11-19 ENCOUNTER — Ambulatory Visit (HOSPITAL_COMMUNITY)
Admission: RE | Admit: 2020-11-19 | Discharge: 2020-11-19 | Disposition: A | Discharge status: Home / Self Care | Payer: Medicare HMO | Source: Ambulatory Visit | Attending: Adult Health | Admitting: Adult Health

## 2020-11-19 ENCOUNTER — Other Ambulatory Visit: Payer: Self-pay

## 2020-11-19 DIAGNOSIS — Z1231 Encounter for screening mammogram for malignant neoplasm of breast: Secondary | ICD-10-CM | POA: Insufficient documentation

## 2020-11-22 ENCOUNTER — Ambulatory Visit: Payer: Medicare HMO | Admitting: Adult Health

## 2020-12-01 ENCOUNTER — Other Ambulatory Visit: Payer: Self-pay

## 2020-12-01 ENCOUNTER — Ambulatory Visit (INDEPENDENT_AMBULATORY_CARE_PROVIDER_SITE_OTHER): Payer: Medicare HMO | Admitting: Nurse Practitioner

## 2020-12-01 ENCOUNTER — Encounter: Payer: Self-pay | Admitting: Nurse Practitioner

## 2020-12-01 VITALS — BP 128/76 | HR 96 | Temp 97.2°F | Ht 63.0 in | Wt 242.0 lb

## 2020-12-01 DIAGNOSIS — N3946 Mixed incontinence: Secondary | ICD-10-CM

## 2020-12-01 DIAGNOSIS — I10 Essential (primary) hypertension: Secondary | ICD-10-CM

## 2020-12-01 DIAGNOSIS — E039 Hypothyroidism, unspecified: Secondary | ICD-10-CM | POA: Diagnosis not present

## 2020-12-01 DIAGNOSIS — K5900 Constipation, unspecified: Secondary | ICD-10-CM | POA: Diagnosis not present

## 2020-12-01 DIAGNOSIS — E785 Hyperlipidemia, unspecified: Secondary | ICD-10-CM

## 2020-12-01 DIAGNOSIS — M159 Polyosteoarthritis, unspecified: Secondary | ICD-10-CM

## 2020-12-01 DIAGNOSIS — M8949 Other hypertrophic osteoarthropathy, multiple sites: Secondary | ICD-10-CM

## 2020-12-01 DIAGNOSIS — E119 Type 2 diabetes mellitus without complications: Secondary | ICD-10-CM

## 2020-12-01 DIAGNOSIS — K219 Gastro-esophageal reflux disease without esophagitis: Secondary | ICD-10-CM | POA: Diagnosis not present

## 2020-12-01 MED ORDER — TRULICITY 0.75 MG/0.5ML ~~LOC~~ SOAJ: 0.7500 mg | SUBCUTANEOUS | 3 refills | Status: DC

## 2020-12-01 NOTE — Progress Notes (Signed)
Careteam: Patient Care Team: Sharon Seller, NP as PCP - General (Geriatric Medicine) Wyline Mood Dorothe Pea, MD as PCP - Cardiology (Cardiology)  PLACE OF SERVICE:  [facility]  Advanced Directive information    Allergies  Allergen Reactions   Naprosyn [Naproxen] Nausea Only    Can take Alleve but years ago patient took a liquid form of this medication and it made me nauseated    Chief Complaint  Patient presents with   Establish Care    New patient establish care and discuss diabetes. Patients previous pcp passed away suddenly. Patient plans to get the flu vaccine in October at a local pharmacy. Patient c/o pain on left lower side x couple weeks, no known injury. Review blood sugar readings from home.      HPI: Patient is a 65 y.o. female to establish care.   Diabetes- on metformin 1000 mg by mouth twice daily. Reports blood sugar 200-400s. Reports blood sugar was 365.   Hypothyroid-  taking NP thyroid 120 and NP Thyroid 60  Hypertension- takes telmisartan-htcz  Hyperlipidemia- on zocor.   Tries to stay away from fried foods and sugars.  Likes to eat corn.  Uses a lot of sugar free Trying to drink more water.   Not walking like she used to.   Hx of asthma, not on medication, shortness of breath with increase in activity.  Hx of cervical cancer, removed and then later removed ovaries.   GERD- uses tums frequently.   Review of Systems:  Review of Systems  Constitutional:  Negative for chills, fever and weight loss.  HENT:  Negative for hearing loss and tinnitus.   Respiratory:  Negative for cough, sputum production and shortness of breath.   Cardiovascular:  Negative for chest pain, palpitations and leg swelling.  Gastrointestinal:  Positive for constipation. Negative for abdominal pain, diarrhea and heartburn.  Genitourinary:  Negative for dysuria, frequency and urgency.  Musculoskeletal:  Positive for joint pain. Negative for back pain, falls and myalgias.   Skin: Negative.   Neurological:  Positive for tingling. Negative for dizziness and headaches.  Psychiatric/Behavioral:  Negative for depression and memory loss. The patient does not have insomnia.    Past Medical History:  Diagnosis Date   Arthritis    "fingers occasionally" (08/26/2014)   Asthmatic bronchitis    "haven't used an inhaler in years" (08/26/2014)   Cervical cancer (HCC)    Counseling for estrogen replacement therapy 12/30/2014   Depression    Diabetes mellitus without complication (HCC)    type- 2   Elevated cholesterol    Exertional shortness of breath    Family history of adverse reaction to anesthesia    "I think my mama gets sick"   GERD (gastroesophageal reflux disease)    Hot flashes 07/05/2012   Hypertension    Hypothyroidism    IFG (impaired fasting glucose)    Mixed stress and urge urinary incontinence    Moody 12/30/2014   Obesity    Panniculus    PONV (postoperative nausea and vomiting)    Rectocele 07/05/2012   Stress incontinence    Superficial fungus infection of skin    Past Surgical History:  Procedure Laterality Date   ABDOMINAL HYSTERECTOMY  1986   "partial" Uterine cancer   ANKLE FUSION Left 10/24/2013   Procedure: LEFT ANKLE SUBTALAR AND TALONAVICULAR FUSION;  Surgeon: Nadara Mustard, MD;  [facility];  Service: Orthopedics;  Laterality: Left;   ANKLE FUSION Left 08/26/2014   Procedure:  Left Foot Take Down Non-union with Revision Talonavicular and Subtalar Fusion;  Surgeon: Newt Minion, MD;  Location: [facility];  Service: Orthopedics;  Laterality: Left;   BACK SURGERY     BILATERAL SALPINGECTOMY  2010   w/LOA   COLONOSCOPY     COLONOSCOPY N/A 07/14/2016   Procedure: COLONOSCOPY;  Surgeon: Danie Binder, MD;  [facility];  Service: Endoscopy;  Laterality: N/A;  8:30   FUSION OF TALONAVICULAR JOINT Left    Take Down Non-union with Revision Talonavicular and Subtalar Fusion /notes 08/26/2014   HAMMER TOE SURGERY Bilateral 2000-2013    right-left   JOINT REPLACEMENT     Right knee   KNEE ARTHROSCOPY Bilateral 2008-2010    left-right   LUMBAR DISC SURGERY  2004   Lumbar 4- 5   POLYPECTOMY  07/14/2016   Procedure: POLYPECTOMY;  Surgeon: Danie Binder, MD;  [facility];  Service: Endoscopy;;  hepatic flexure   SHOULDER ARTHROSCOPY WITH SUBACROMIAL DECOMPRESSION, ROTATOR CUFF REPAIR AND BICEP TENDON REPAIR Right 02/11/2019   Procedure: right shoulde arthroscopy, biceps tenodesis lower trapezius tendon transfer;  Surgeon: Meredith Pel, MD;  Location: [facility];  Service: Orthopedics;  Laterality: Right;   TOTAL KNEE ARTHROPLASTY Right 2013   TUBAL LIGATION     Social History:   reports that she quit smoking about 32 years ago. Her smoking use included cigarettes. She has a 5.00 pack-year smoking history. She has never used smokeless tobacco. She reports current alcohol use. She reports that she does not use drugs.  Family History  Problem Relation Age of Onset   Stroke Mother    Heart attack Father        MI at age 23   Diabetes Brother    Hypertension Brother    Diabetes Brother    Hypertension Brother    Other Brother        Gastrointestional disease   Diabetes Maternal Grandmother    Diabetes Paternal Grandmother    Cancer Maternal Aunt        cervical    Medications: Patient's Medications  New Prescriptions   No medications on file  Previous Medications   ACETAMINOPHEN (TYLENOL) 500 MG TABLET    Take 500 mg by mouth as needed.   METFORMIN (GLUCOPHAGE) 500 MG TABLET    Take 2 tablets (1,000 mg total) by mouth 2 (two) times daily.   NP THYROID 120 MG TABLET    TAKE 1 TABLET(120 MG) BY MOUTH DAILY BEFORE BREAKFAST   NP THYROID 60 MG TABLET    Take 1 tablet (60 mg total) by mouth daily before lunch.   ONETOUCH DELICA LANCETS 30S MISC    USE TO OBTAIN A BLOOD SPECIMEN TO TEST BLOOD SUGAR   ONETOUCH ULTRA TEST STRIP    USE TO TEST BLOOD SUGAR ONCE DAILY. FOR ICD 10 - E11.9    PSEUDOEPH-DOXYLAMINE-DM-APAP (NYQUIL PO)    Take by mouth as needed.   PSEUDOEPHEDRINE-APAP-DM (DAYQUIL PO)    Take by mouth as needed.   SIMVASTATIN (ZOCOR) 40 MG TABLET    Take 1 tablet (40 mg total) by mouth daily at 6 PM.   TELMISARTAN-HYDROCHLOROTHIAZIDE (MICARDIS HCT) 40-12.5 MG TABLET    Take 1 tablet by mouth daily.  Modified Medications   No medications on file  Discontinued Medications   OXYBUTYNIN (DITROPAN-XL) 10 MG 24 HR TABLET    Take 1 tablet (10 mg total) by mouth at bedtime.    Physical Exam:  Vitals:   12/01/20 1338  BP: 128/76  Pulse: 96  Temp: (!) 97.2 F (36.2 C)  TempSrc: Temporal  SpO2: 96%  Weight: 242 lb (109.8 kg)  Height: $Remove'5\' 3"'jEYWioJ$  (1.6 m)   Body mass index is 42.87 kg/m. Wt Readings from Last 3 Encounters:  12/01/20 242 lb (109.8 kg)  10/25/20 243 lb (110.2 kg)  09/09/20 244 lb 3.2 oz (110.8 kg)    Physical Exam Constitutional:      General: She is not in acute distress.    Appearance: She is well-developed. She is obese. She is not diaphoretic.  HENT:     Head: Normocephalic and atraumatic.     Mouth/Throat:     Pharynx: No oropharyngeal exudate.  Eyes:     Conjunctiva/sclera: Conjunctivae normal.     Pupils: Pupils are equal, round, and reactive to light.  Cardiovascular:     Rate and Rhythm: Normal rate and regular rhythm.     Heart sounds: Normal heart sounds.  Pulmonary:     Effort: Pulmonary effort is normal.     Breath sounds: Normal breath sounds.  Abdominal:     General: Bowel sounds are normal.     Palpations: Abdomen is soft.  Musculoskeletal:     Cervical back: Normal range of motion and neck supple.     Right lower leg: No edema.     Left lower leg: No edema.  Skin:    General: Skin is warm and dry.  Neurological:     Mental Status: She is alert.  Psychiatric:        Mood and Affect: Mood normal.    Labs reviewed: Basic Metabolic Panel: Recent Labs    12/15/19 0821 05/03/20 1353 09/09/20 1021  NA 136 137 141   K 4.9 4.5 3.7  CL 101 103 110  CO2 $Re'27 26 24  'mJL$ GLUCOSE 149* 149* 141*  BUN 30* 19 14  CREATININE 0.83 0.69 0.54  CALCIUM 9.5 9.6 9.1  TSH 2.09 0.23* 0.06*   Liver Function Tests: Recent Labs    12/15/19 0821 05/03/20 1353  AST 20 18  ALT 15 17  BILITOT 0.4 0.4  PROT 7.1 7.0   No results for input(s): LIPASE, AMYLASE in the last 8760 hours. No results for input(s): AMMONIA in the last 8760 hours. CBC: Recent Labs    12/15/19 0821  WBC 6.1  NEUTROABS 2,922  HGB 12.9  HCT 38.7  MCV 90.6  PLT 284   Lipid Panel: Recent Labs    12/15/19 0821 05/03/20 1353  CHOL 244* 170  HDL 53 47*  LDLCALC 155* 99  TRIG 199* 144  CHOLHDL 4.6 3.6   TSH: Recent Labs    12/15/19 0821 05/03/20 1353 09/09/20 1021  TSH 2.09 0.23* 0.06*   A1C: Lab Results  Component Value Date   HGBA1C 7.4 (H) 09/09/2020     Assessment/Plan 1. Urinary incontinence, mixed -ongoing, uses pad daily, medication was too expensive so she opted to go without.  2. Morbid (severe) obesity due to excess calories ([place]) -education provided on healthy weight loss through increase in physical activity and proper nutrition   3. Hypothyroidism, unspecified type -TSH low on last labs, will follow up at this time and make adjustments if needed - TSH  4. Type 2 diabetes mellitus without complication, without long-term current use of insulin (H[facility]) -to continue on metformin 1000 mg BID, Encouraged dietary compliance, routine foot care/monitoring and to keep up with diabetic eye exams through ophthalmology  -declines  nutritional referral at this time. - Hemoglobin A1c - blood sugars elevated on review in office, will add Dulaglutide (TRULICITY) 4.06 RE/6.1EA SOPN; Inject 0.75 mg into the skin once a week.  Dispense: 2 mL; Refill: 3 - Hemoglobin A1c; Future  5. Hyperlipidemia LDL goal <70 -not at goal on last lab, on simvastatin 40 mg daily, may need to change to lipitor or crestor to get to goal. Continue  dietary modifications.  - Lipid Panel  6. Essential hypertension --stable. Goal bp <140/90. Continue on current regimen with low sodium diet.  - CBC with Differential/Platelet - CMP with eGFR([facility])  7. Gastroesophageal reflux disease without esophagitis -continues on tums PRN, may need daily medication, dietary modifications encouraged.  8. Primary osteoarthritis involving multiple joints Stable, uses tylenol PRN  9. Constipation, unspecified constipation type To increase water intake, can use miralax 17 gm daily PRN   Next appt: 3 months, labs prior.  Carlos American. [place], [facility] [phone]

## 2020-12-01 NOTE — Patient Instructions (Addendum)
Miralax 17 gm by mouth daily for constipation

## 2020-12-02 ENCOUNTER — Other Ambulatory Visit: Payer: Self-pay | Admitting: Nurse Practitioner

## 2020-12-02 DIAGNOSIS — E785 Hyperlipidemia, unspecified: Secondary | ICD-10-CM

## 2020-12-02 DIAGNOSIS — E039 Hypothyroidism, unspecified: Secondary | ICD-10-CM

## 2020-12-02 LAB — CBC WITH DIFFERENTIAL/PLATELET
Absolute Monocytes: 655 cells/uL (ref 200–950)
Basophils Absolute: 69 cells/uL (ref 0–200)
Basophils Relative: 0.9 %
Eosinophils Absolute: 162 cells/uL (ref 15–500)
Eosinophils Relative: 2.1 %
HCT: 39 % (ref 35.0–45.0)
Hemoglobin: 12.8 g/dL (ref 11.7–15.5)
Lymphs Abs: 1917 cells/uL (ref 850–3900)
MCH: 29.6 pg (ref 27.0–33.0)
MCHC: 32.8 g/dL (ref 32.0–36.0)
MCV: 90.1 fL (ref 80.0–100.0)
MPV: 9.7 fL (ref 7.5–12.5)
Monocytes Relative: 8.5 %
Neutro Abs: 4897 cells/uL (ref 1500–7800)
Neutrophils Relative %: 63.6 %
Platelets: 282 10*3/uL (ref 140–400)
RBC: 4.33 10*6/uL (ref 3.80–5.10)
RDW: 12.6 % (ref 11.0–15.0)
Total Lymphocyte: 24.9 %
WBC: 7.7 10*3/uL (ref 3.8–10.8)

## 2020-12-02 LAB — COMPLETE METABOLIC PANEL WITH GFR
AG Ratio: 1.4 (calc) (ref 1.0–2.5)
ALT: 15 U/L (ref 6–29)
AST: 17 U/L (ref 10–35)
Albumin: 4.3 g/dL (ref 3.6–5.1)
Alkaline phosphatase (APISO): 66 U/L (ref 37–153)
BUN/Creatinine Ratio: 34 (calc) — ABNORMAL HIGH (ref 6–22)
BUN: 28 mg/dL — ABNORMAL HIGH (ref 7–25)
CO2: 26 mmol/L (ref 20–32)
Calcium: 9.9 mg/dL (ref 8.6–10.4)
Chloride: 101 mmol/L (ref 98–110)
Creat: 0.83 mg/dL (ref 0.50–1.05)
Globulin: 3 g/dL (calc) (ref 1.9–3.7)
Glucose, Bld: 368 mg/dL — ABNORMAL HIGH (ref 65–139)
Potassium: 4.5 mmol/L (ref 3.5–5.3)
Sodium: 136 mmol/L (ref 135–146)
Total Bilirubin: 0.3 mg/dL (ref 0.2–1.2)
Total Protein: 7.3 g/dL (ref 6.1–8.1)
eGFR: 79 mL/min/{1.73_m2} (ref >=60)

## 2020-12-02 LAB — LIPID PANEL
Cholesterol: 179 mg/dL (ref <200)
HDL: 53 mg/dL (ref >=50)
LDL Cholesterol (Calc): 102 mg/dL (calc) — ABNORMAL HIGH
Non-HDL Cholesterol (Calc): 126 mg/dL (calc) (ref <130)
Total CHOL/HDL Ratio: 3.4 (calc) (ref <5.0)
Triglycerides: 147 mg/dL (ref <150)

## 2020-12-02 LAB — HEMOGLOBIN A1C
Hgb A1c MFr Bld: 7.9 % of total Hgb — ABNORMAL HIGH (ref <5.7)
Mean Plasma Glucose: 180 mg/dL
eAG (mmol/L): 10 mmol/L

## 2020-12-02 LAB — TSH: TSH: 0.03 mIU/L — ABNORMAL LOW (ref 0.40–4.50)

## 2020-12-02 MED ORDER — ROSUVASTATIN CALCIUM 20 MG PO TABS: 20.0000 mg | ORAL_TABLET | Freq: Every day | ORAL | 3 refills | Status: DC

## 2020-12-02 MED ORDER — THYROID 30 MG PO TABS
45.0000 mg | ORAL_TABLET | Freq: Every day | ORAL | 1 refills | Status: DC
Start: 2020-12-02 — End: 2021-02-01

## 2020-12-23 ENCOUNTER — Other Ambulatory Visit (INDEPENDENT_AMBULATORY_CARE_PROVIDER_SITE_OTHER): Payer: Self-pay | Admitting: Nurse Practitioner

## 2020-12-24 NOTE — Telephone Encounter (Signed)
Please call the pharmacy and have them dc her 60 mg tablet, she should be taking 45 mg with 120 mg

## 2020-12-27 ENCOUNTER — Encounter (INDEPENDENT_AMBULATORY_CARE_PROVIDER_SITE_OTHER): Payer: Medicare HMO | Admitting: Internal Medicine

## 2021-01-25 ENCOUNTER — Other Ambulatory Visit: Payer: Self-pay | Admitting: Nurse Practitioner

## 2021-01-25 DIAGNOSIS — E119 Type 2 diabetes mellitus without complications: Secondary | ICD-10-CM

## 2021-01-31 ENCOUNTER — Telehealth: Payer: Self-pay | Admitting: *Deleted

## 2021-01-31 DIAGNOSIS — E039 Hypothyroidism, unspecified: Secondary | ICD-10-CM

## 2021-01-31 NOTE — Telephone Encounter (Signed)
Patient called and stated that the pharmacy is filling [facility] Thyroid but she has been taking Thyroid NP.   Patient wants to know if this is the same thing and ok for her to take.   Please Advise.

## 2021-02-01 MED ORDER — THYROID 30 MG PO TABS: 45.0000 mg | ORAL_TABLET | Freq: Every day | ORAL | 1 refills | Status: DC

## 2021-02-01 NOTE — Telephone Encounter (Signed)
LMOM to return call.

## 2021-02-01 NOTE — Telephone Encounter (Signed)
Patient requested the Rx to be sent to [facility] instead. Stated that they have the Thyroid NP in stock.   Confirmed dosage with patient and Rx sent.

## 2021-02-01 NOTE — Telephone Encounter (Signed)
This is okay to take, if they are changing manufactures will want to follow up TSH in 8 weeks to make sure thyroid function is stable.

## 2021-02-07 ENCOUNTER — Ambulatory Visit: Payer: Medicare HMO | Admitting: Cardiology

## 2021-02-07 NOTE — Progress Notes (Deleted)
Clinical Summary Ms. Hollander is a 65 y.o.female  1. DOE - ongoing SOB for a few years, increasing - DOE with mopping, sweeping etc.  - no chest pains - no recent edema - some cough/wheezing though infrequent     CAD risk factors: DM2, HTN, HL, very remote short smoking history, father MI age 74 - prior foot surgery x 2, cannot run on treadmill.   05/2020 nuclear stress: No ischemia 05/2020 echo LVEF 55-60%, no WMAs, grade I DD,    2. HTN - compliant with meds - at recent pcp visit 130/80     3. Hyperlipidemia - followd by pcp, she is on statin   4. HTN    Past Medical History:  Diagnosis Date   Arthritis    "fingers occasionally" (08/26/2014)   Asthmatic bronchitis    "haven't used an inhaler in years" (08/26/2014)   Cervical cancer ([place])    Counseling for estrogen replacement therapy 12/30/2014   Depression    Diabetes mellitus without complication (HCC)    type- 2   Elevated cholesterol    Exertional shortness of breath    Family history of adverse reaction to anesthesia    "I think my mama gets sick"   GERD (gastroesophageal reflux disease)    Hot flashes 07/05/2012   Hypertension    Hypothyroidism    IFG (impaired fasting glucose)    Mixed stress and urge urinary incontinence    Moody 12/30/2014   Obesity    Panniculus    PONV (postoperative nausea and vomiting)    Rectocele 07/05/2012   Stress incontinence    Superficial fungus infection of skin      Allergies  Allergen Reactions   Naprosyn [Naproxen] Nausea Only    Can take Alleve but years ago patient took a liquid form of this medication and it made me nauseated     Current Outpatient Medications  Medication Sig Dispense Refill   Dulaglutide (TRULICITY) 3.53 GD/9.2EQ SOPN INJECT 0.75 MG INTO THE SKIN ONCE A WEEK. 2 mL 6   acetaminophen (TYLENOL) 500 MG tablet Take 500 mg by mouth as needed.     metFORMIN (GLUCOPHAGE) 500 MG tablet Take 2 tablets (1,000 mg total) by mouth 2 (two) times daily.  360 tablet 0   OneTouch Delica Lancets 68T MISC USE TO OBTAIN A BLOOD SPECIMEN TO TEST BLOOD SUGAR 100 each 2   ONETOUCH ULTRA test strip USE TO TEST BLOOD SUGAR ONCE DAILY. FOR ICD 10 - E11.9 100 strip 1   rosuvastatin (CRESTOR) 20 MG tablet Take 1 tablet (20 mg total) by mouth daily. 90 tablet 3   telmisartan-hydrochlorothiazide (MICARDIS HCT) 40-12.5 MG tablet Take 1 tablet by mouth daily. 90 tablet 1   thyroid (NP THYROID) 120 MG tablet TAKE 1 TABLET(120 MG) BY MOUTH DAILY BEFORE AND BREAKFAST 30 tablet 1   thyroid (NP THYROID) 30 MG tablet Take 1.5 tablets (45 mg total) by mouth daily before breakfast. 135 tablet 1   No current facility-administered medications for this visit.     Past Surgical History:  Procedure Laterality Date   ABDOMINAL HYSTERECTOMY  1986   "partial" Uterine cancer   ANKLE FUSION Left 10/24/2013   Procedure: LEFT ANKLE SUBTALAR AND TALONAVICULAR FUSION;  Surgeon: Newt Minion, MD;  Location: [facility];  Service: Orthopedics;  Laterality: Left;   ANKLE FUSION Left 08/26/2014   Procedure: Left Foot Take Down Non-union with Revision Talonavicular and Subtalar Fusion;  Surgeon: Newt Minion, MD;  Location: [facility];  Service: Orthopedics;  Laterality: Left;   BACK SURGERY     BILATERAL SALPINGECTOMY  2010   w/LOA   COLONOSCOPY     COLONOSCOPY N/A 07/14/2016   Procedure: COLONOSCOPY;  Surgeon: Danie Binder, MD;  [facility];  Service: Endoscopy;  Laterality: N/A;  8:30   FUSION OF TALONAVICULAR JOINT Left    Take Down Non-union with Revision Talonavicular and Subtalar Fusion /notes 08/26/2014   HAMMER TOE SURGERY Bilateral 2000-2013   right-left   JOINT REPLACEMENT     Right knee   KNEE ARTHROSCOPY Bilateral 2008-2010    left-right   LUMBAR DISC SURGERY  2004   Lumbar 4- 5   POLYPECTOMY  07/14/2016   Procedure: POLYPECTOMY;  Surgeon: Danie Binder, MD;  [facility];  Service: Endoscopy;;  hepatic flexure   SHOULDER ARTHROSCOPY WITH  SUBACROMIAL DECOMPRESSION, ROTATOR CUFF REPAIR AND BICEP TENDON REPAIR Right 02/11/2019   Procedure: right shoulde arthroscopy, biceps tenodesis lower trapezius tendon transfer;  Surgeon: Meredith Pel, MD;  Location: [facility];  Service: Orthopedics;  Laterality: Right;   TOTAL KNEE ARTHROPLASTY Right 2013   TUBAL LIGATION       Allergies  Allergen Reactions   Naprosyn [Naproxen] Nausea Only    Can take Alleve but years ago patient took a liquid form of this medication and it made me nauseated      Family History  Problem Relation Age of Onset   Heart disease Mother    Stroke Mother    Heart disease Father    Heart attack Father        MI at age 19   Diabetes Brother    Hypertension Brother    Diabetes Brother    Hypertension Brother    Other Brother        Gastrointestional disease   Cancer Maternal Aunt        cervical   Diabetes Maternal Grandmother    Diabetes Paternal Grandmother      Social History Ms. Azimi reports that she quit smoking about 32 years ago. Her smoking use included cigarettes. She has a 5.00 pack-year smoking history. She has never used smokeless tobacco. Ms. Hruska reports current alcohol use.   Review of Systems CONSTITUTIONAL: No weight loss, fever, chills, weakness or fatigue.  HEENT: Eyes: No visual loss, blurred vision, double vision or yellow sclerae.No hearing loss, sneezing, congestion, runny nose or sore throat.  SKIN: No rash or itching.  CARDIOVASCULAR:  RESPIRATORY: No shortness of breath, cough or sputum.  GASTROINTESTINAL: No anorexia, nausea, vomiting or diarrhea. No abdominal pain or blood.  GENITOURINARY: No burning on urination, no polyuria NEUROLOGICAL: No headache, dizziness, syncope, paralysis, ataxia, numbness or tingling in the extremities. No change in bowel or bladder control.  MUSCULOSKELETAL: No muscle, back pain, joint pain or stiffness.  LYMPHATICS: No enlarged nodes. No history of splenectomy.  PSYCHIATRIC: No  history of depression or anxiety.  ENDOCRINOLOGIC: No reports of sweating, cold or heat intolerance. No polyuria or polydipsia.  Marland Kitchen   Physical Examination There were no vitals filed for this visit. There were no vitals filed for this visit.  Gen: resting comfortably, no acute distress HEENT: no scleral icterus, pupils equal round and reactive, no palptable cervical adenopathy,  CV Resp: Clear to auscultation bilaterally GI: abdomen is soft, non-tender, non-distended, normal bowel sounds, no hepatosplenomegaly MSK: extremities are warm, no edema.  Skin: warm, no rash Neuro:  no focal deficits Psych: appropriate affect  Diagnostic Studies  05/2020 echo IMPRESSIONS     1. Left ventricular ejection fraction, by estimation, is 55 to 60%. The  left ventricle has normal function. The left ventricle has no regional  wall motion abnormalities. Left ventricular diastolic parameters are  consistent with Grade I diastolic  dysfunction (impaired relaxation).   2. Right ventricular systolic function is normal. The right ventricular  size is normal.   3. Left atrial size was mildly dilated.   4. The mitral valve is normal in structure. No evidence of mitral valve  regurgitation. No evidence of mitral stenosis.   5. The aortic valve was not well visualized. Aortic valve regurgitation  is not visualized. No aortic stenosis is present.   6. The inferior vena cava is normal in size with greater than 50%  respiratory variability, suggesting right atrial pressure of 3 mmHg.    05/2020 nuclear stress Lexiscan stress is electrically negative for ischemia Myovue scan shows normal perfusion. No ischemia or scar LVEF calculated at 56% Overall low risk scan     Assessment and Plan  1. DOE/SOB - unclear etiology - will obtain echo to evaluate for underlying cardiac dysfunction - pending echo, given strong CAD risk factors likely lexiscan 2 day protocol after echo - EKG today shows NSR    2. HTN - elevated today but has been at goal at other recent multiple clinical visits - continue to monitor.       Arnoldo Lenis, M.D., F.A.C.C.

## 2021-02-25 ENCOUNTER — Other Ambulatory Visit: Payer: Self-pay

## 2021-02-25 DIAGNOSIS — E119 Type 2 diabetes mellitus without complications: Secondary | ICD-10-CM | POA: Diagnosis not present

## 2021-02-25 DIAGNOSIS — E039 Hypothyroidism, unspecified: Secondary | ICD-10-CM

## 2021-02-25 DIAGNOSIS — E785 Hyperlipidemia, unspecified: Secondary | ICD-10-CM

## 2021-02-26 LAB — COMPLETE METABOLIC PANEL WITH GFR
AG Ratio: 1.5 (calc) (ref 1.0–2.5)
ALT: 13 U/L (ref 6–29)
AST: 18 U/L (ref 10–35)
Albumin: 4.1 g/dL (ref 3.6–5.1)
Alkaline phosphatase (APISO): 66 U/L (ref 37–153)
BUN: 23 mg/dL (ref 7–25)
CO2: 26 mmol/L (ref 20–32)
Calcium: 9.7 mg/dL (ref 8.6–10.4)
Chloride: 103 mmol/L (ref 98–110)
Creat: 0.71 mg/dL (ref 0.50–1.05)
Globulin: 2.7 g/dL (calc) (ref 1.9–3.7)
Glucose, Bld: 145 mg/dL — ABNORMAL HIGH (ref 65–99)
Potassium: 4.6 mmol/L (ref 3.5–5.3)
Sodium: 138 mmol/L (ref 135–146)
Total Bilirubin: 0.5 mg/dL (ref 0.2–1.2)
Total Protein: 6.8 g/dL (ref 6.1–8.1)
eGFR: 95 mL/min/{1.73_m2} (ref >=60)

## 2021-02-26 LAB — LIPID PANEL
Cholesterol: 154 mg/dL (ref <200)
HDL: 53 mg/dL (ref >=50)
LDL Cholesterol (Calc): 80 mg/dL (calc)
Non-HDL Cholesterol (Calc): 101 mg/dL (calc) (ref <130)
Total CHOL/HDL Ratio: 2.9 (calc) (ref <5.0)
Triglycerides: 110 mg/dL (ref <150)

## 2021-02-26 LAB — TSH: TSH: 0.07 mIU/L — ABNORMAL LOW (ref 0.40–4.50)

## 2021-02-26 LAB — HEMOGLOBIN A1C
Hgb A1c MFr Bld: 7 % of total Hgb — ABNORMAL HIGH (ref <5.7)
Mean Plasma Glucose: 154 mg/dL
eAG (mmol/L): 8.5 mmol/L

## 2021-02-28 ENCOUNTER — Other Ambulatory Visit (INDEPENDENT_AMBULATORY_CARE_PROVIDER_SITE_OTHER): Payer: Self-pay | Admitting: Nurse Practitioner

## 2021-02-28 NOTE — Telephone Encounter (Signed)
Patient has request refill on medication "Thyroid 120mg ". Patient medication last refilled 12/24/2020. Patient  medication has High Risk Warnings. Patient medication pend and sent to PCP Dewaine Oats Carlos American, NP for approval. Please Advise.

## 2021-03-02 ENCOUNTER — Other Ambulatory Visit: Payer: Self-pay

## 2021-03-07 ENCOUNTER — Encounter: Payer: Self-pay | Admitting: Nurse Practitioner

## 2021-03-07 ENCOUNTER — Other Ambulatory Visit: Payer: Self-pay

## 2021-03-07 ENCOUNTER — Ambulatory Visit (INDEPENDENT_AMBULATORY_CARE_PROVIDER_SITE_OTHER): Payer: Medicare HMO | Admitting: Nurse Practitioner

## 2021-03-07 VITALS — BP 132/80 | HR 77 | Temp 97.2°F | Ht 61.5 in | Wt 240.0 lb

## 2021-03-07 DIAGNOSIS — E119 Type 2 diabetes mellitus without complications: Secondary | ICD-10-CM | POA: Diagnosis not present

## 2021-03-07 DIAGNOSIS — E039 Hypothyroidism, unspecified: Secondary | ICD-10-CM | POA: Diagnosis not present

## 2021-03-07 DIAGNOSIS — I10 Essential (primary) hypertension: Secondary | ICD-10-CM

## 2021-03-07 DIAGNOSIS — E785 Hyperlipidemia, unspecified: Secondary | ICD-10-CM

## 2021-03-07 MED ORDER — TRULICITY 1.5 MG/0.5ML ~~LOC~~ SOAJ: 1.5000 mg | SUBCUTANEOUS | 2 refills | Status: DC

## 2021-03-07 NOTE — Progress Notes (Signed)
Careteam: Patient Care Team: Lauree Chandler, NP as PCP - General (Geriatric Medicine) Harl Bowie Alphonse Guild, MD as PCP - Cardiology (Cardiology)  PLACE OF SERVICE:  [facility] Directive information Does Patient Have a Medical Advance Directive?: No, Would patient like information on creating a medical advance directive?: Yes (MAU/Ambulatory/Procedural Areas - Information given)  Allergies  Allergen Reactions   Naprosyn [Naproxen] Nausea Only    Can take Alleve but years ago patient took a liquid form of this medication and it made me nauseated    Chief Complaint  Patient presents with   Medical Management of Chronic Issues    3 month follow-up and further discuss recent labs. Discuss need for HIV screening, Hep C screening, shingrix, PCV, covid booster, and eye exam or post pone if patient refuses.  Discuss changing trulicity, patient would like to switch to toujeo      HPI: Patient is a 65 y.o. female for routine follow up. Overall doing well. No acute concerns. Continues to work on diet and exercise.   DM- A1c has improved but would like to get less than 7.  She wants to change medication because family member lost significant weight. Reports it take her a lot to lose weight.   Hypothyroid- TSH was low- thyroid decreased to 150 mg- she has started this.   Hyperlipidemia- LDL improved on recent lab, goal <70.     Review of Systems:  Review of Systems  Constitutional:  Negative for chills, fever and weight loss.  HENT:  Negative for tinnitus.   Respiratory:  Negative for cough, sputum production and shortness of breath.   Cardiovascular:  Negative for chest pain, palpitations and leg swelling.  Gastrointestinal:  Negative for abdominal pain, constipation, diarrhea and heartburn.  Genitourinary:  Negative for dysuria, frequency and urgency.  Musculoskeletal:  Negative for back pain, falls, joint pain and myalgias.  Skin: Negative.   Neurological:  Negative  for dizziness and headaches.  Psychiatric/Behavioral:  Negative for depression and memory loss. The patient does not have insomnia.    Past Medical History:  Diagnosis Date   Arthritis    "fingers occasionally" (08/26/2014)   Asthmatic bronchitis    "haven't used an inhaler in years" (08/26/2014)   Cervical cancer ([place])    Counseling for estrogen replacement therapy 12/30/2014   Depression    Diabetes mellitus without complication (HCC)    type- 2   Elevated cholesterol    Exertional shortness of breath    Family history of adverse reaction to anesthesia    "I think my mama gets sick"   GERD (gastroesophageal reflux disease)    Hot flashes 07/05/2012   Hypertension    Hypothyroidism    IFG (impaired fasting glucose)    Mixed stress and urge urinary incontinence    Moody 12/30/2014   Obesity    Panniculus    PONV (postoperative nausea and vomiting)    Rectocele 07/05/2012   Stress incontinence    Superficial fungus infection of skin    Past Surgical History:  Procedure Laterality Date   ABDOMINAL HYSTERECTOMY  1986   "partial" Uterine cancer   ANKLE FUSION Left 10/24/2013   Procedure: LEFT ANKLE SUBTALAR AND TALONAVICULAR FUSION;  Surgeon: Newt Minion, MD;  Location: [facility];  Service: Orthopedics;  Laterality: Left;   ANKLE FUSION Left 08/26/2014   Procedure: Left Foot Take Down Non-union with Revision Talonavicular and Subtalar Fusion;  Surgeon: Newt Minion, MD;  Location: [facility];  Service: Orthopedics;  Laterality: Left;   BACK SURGERY     BILATERAL SALPINGECTOMY  2010   w/LOA   COLONOSCOPY     COLONOSCOPY N/A 07/14/2016   Procedure: COLONOSCOPY;  Surgeon: Danie Binder, MD;  [facility];  Service: Endoscopy;  Laterality: N/A;  8:30   FUSION OF TALONAVICULAR JOINT Left    Take Down Non-union with Revision Talonavicular and Subtalar Fusion /notes 08/26/2014   HAMMER TOE SURGERY Bilateral 2000-2013   right-left   JOINT REPLACEMENT     Right knee   KNEE ARTHROSCOPY  Bilateral 2008-2010    left-right   LUMBAR DISC SURGERY  2004   Lumbar 4- 5   POLYPECTOMY  07/14/2016   Procedure: POLYPECTOMY;  Surgeon: Danie Binder, MD;  [facility];  Service: Endoscopy;;  hepatic flexure   SHOULDER ARTHROSCOPY WITH SUBACROMIAL DECOMPRESSION, ROTATOR CUFF REPAIR AND BICEP TENDON REPAIR Right 02/11/2019   Procedure: right shoulde arthroscopy, biceps tenodesis lower trapezius tendon transfer;  Surgeon: Meredith Pel, MD;  Location: [facility];  Service: Orthopedics;  Laterality: Right;   TOTAL KNEE ARTHROPLASTY Right 2013   TUBAL LIGATION     Social History:   reports that she quit smoking about 32 years ago. Her smoking use included cigarettes. She has a 5.00 pack-year smoking history. She has never used smokeless tobacco. She reports current alcohol use. She reports that she does not use drugs.  Family History  Problem Relation Age of Onset   Heart disease Mother    Stroke Mother    Heart disease Father    Heart attack Father        MI at age 15   Diabetes Brother    Hypertension Brother    Diabetes Brother    Hypertension Brother    Other Brother        Gastrointestional disease   Cancer Maternal Aunt        cervical   Diabetes Maternal Grandmother    Diabetes Paternal Grandmother     Medications: Patient's Medications  New Prescriptions   No medications on file  Previous Medications   ACETAMINOPHEN (TYLENOL) 500 MG TABLET    Take 500 mg by mouth as needed.   DULAGLUTIDE (TRULICITY) 3.97 QB/3.4LP SOPN    INJECT 0.75 MG INTO THE SKIN ONCE A WEEK.   METFORMIN (GLUCOPHAGE) 500 MG TABLET    Take 2 tablets (1,000 mg total) by mouth 2 (two) times daily.   NP THYROID 120 MG TABLET    TAKE 1 TABLET(120 MG) BY MOUTH DAILY BEFORE AND BREAKFAST   ONETOUCH DELICA LANCETS 37T MISC    USE TO OBTAIN A BLOOD SPECIMEN TO TEST BLOOD SUGAR   ONETOUCH ULTRA TEST STRIP    USE TO TEST BLOOD SUGAR ONCE DAILY. FOR ICD 10 - E11.9   ROSUVASTATIN (CRESTOR) 20 MG  TABLET    Take 1 tablet (20 mg total) by mouth daily.   TELMISARTAN-HYDROCHLOROTHIAZIDE (MICARDIS HCT) 40-12.5 MG TABLET    Take 1 tablet by mouth daily.   THYROID (NP THYROID) 30 MG TABLET    Take 1.5 tablets (45 mg total) by mouth daily before breakfast.  Modified Medications   No medications on file  Discontinued Medications   No medications on file    Physical Exam:  Vitals:   03/07/21 1132  BP: 132/80  Pulse: 77  Temp: (!) 97.2 F (36.2 C)  TempSrc: Temporal  SpO2: 96%  Weight: 240 lb (108.9 kg)  Height: 5' 1.5" (  1.562 m)   Body mass index is 44.61 kg/m. Wt Readings from Last 3 Encounters:  03/07/21 240 lb (108.9 kg)  12/01/20 242 lb (109.8 kg)  10/25/20 243 lb (110.2 kg)    Physical Exam Constitutional:      General: She is not in acute distress.    Appearance: She is well-developed. She is not diaphoretic.  HENT:     Head: Normocephalic and atraumatic.     Mouth/Throat:     Pharynx: No oropharyngeal exudate.  Eyes:     Conjunctiva/sclera: Conjunctivae normal.     Pupils: Pupils are equal, round, and reactive to light.  Cardiovascular:     Rate and Rhythm: Normal rate and regular rhythm.     Heart sounds: Normal heart sounds.  Pulmonary:     Effort: Pulmonary effort is normal.     Breath sounds: Normal breath sounds.  Abdominal:     General: Bowel sounds are normal.     Palpations: Abdomen is soft.  Musculoskeletal:     Cervical back: Normal range of motion and neck supple.     Right lower leg: No edema.     Left lower leg: No edema.  Skin:    General: Skin is warm and dry.  Neurological:     Mental Status: She is alert.  Psychiatric:        Mood and Affect: Mood normal.    Labs reviewed: Basic Metabolic Panel: Recent Labs    09/09/20 1021 12/01/20 1417 02/25/21 0859  NA 141 136 138  K 3.7 4.5 4.6  CL 110 101 103  CO2 _0 GLUCOSE 141* 368* 145*  BUN 14 28* 23  CREATININE 0.54 0.83 0.71  CALCIUM 9.1 9.9 9.7  TSH 0.06* 0.03* 0.07*    Liver Function Tests: Recent Labs    05/03/20 1353 12/01/20 1417 02/25/21 0859  AST _1 ALT _2 BILITOT 0.4 0.3 0.5  PROT 7.0 7.3 6.8   No results for input(s): LIPASE, AMYLASE in the last 8760 hours. No results for input(s): AMMONIA in the last 8760 hours. CBC: Recent Labs    12/01/20 1417  WBC 7.7  NEUTROABS 4,897  HGB 12.8  HCT 39.0  MCV 90.1  PLT 282   Lipid Panel: Recent Labs    05/03/20 1353 12/01/20 1417 02/25/21 0859  CHOL 170 179 154  HDL 47* 53 53  LDLCALC 99 102* 80  TRIG 144 147 110  CHOLHDL 3.6 3.4 2.9   TSH: Recent Labs    09/09/20 1021 12/01/20 1417 02/25/21 0859  TSH 0.06* 0.03* 0.07*   A1C: Lab Results  Component Value Date   HGBA1C 7.0 (H) 02/25/2021     Assessment/Plan 1. Type 2 diabetes mellitus without complication, without long-term current use of insulin (HCC) -discussed changing medication- her friend is on another GLP-2. -at this time we will increase trulicity to 1.5 mg daily with dietary modifications to get A1c to goal and help promote weight loss.   - Dulaglutide (TRULICITY) 1.5 UD/1.4HF SOPN; Inject 1.5 mg into the skin once a week.  Dispense: 2 mL; Refill: 2 - Hemoglobin A1c; Future  2. Hyperlipidemia LDL goal <70 -continues on crestor20 mg daily with dietary modifications.  Almost to goal.  - CMP with eGFR([facility]); Future  3. Hypothyroidism, unspecified type -she has reduced NP thyroid to 150 mg daily. No signs of hyperthyroid.  - TSH; Future 4. Morbid (severe) obesity due to excess calories [facility]) --education provided on  healthy weight loss through increase in physical activity and proper nutrition   5. Essential hypertension -well controlled on telmisartan-hctz. Continue medication and dietary modifications.    Next appt: 3 months, labs prior Rithy Mandley K. [place], [facility] [phone]

## 2021-03-08 MED ORDER — THYROID 30 MG PO TABS: 30.0000 mg | ORAL_TABLET | Freq: Every day | ORAL | 1 refills | Status: DC

## 2021-03-22 ENCOUNTER — Other Ambulatory Visit: Payer: Self-pay | Admitting: *Deleted

## 2021-03-22 MED ORDER — ONETOUCH DELICA LANCETS 33G MISC: 5 refills | Status: AC

## 2021-03-22 MED ORDER — ONETOUCH ULTRA VI STRP: ORAL_STRIP | 5 refills | Status: AC

## 2021-03-22 NOTE — Telephone Encounter (Signed)
Patient requested refill

## 2021-03-25 ENCOUNTER — Encounter: Payer: Self-pay | Admitting: Cardiology

## 2021-03-25 ENCOUNTER — Other Ambulatory Visit: Payer: Self-pay

## 2021-03-25 ENCOUNTER — Ambulatory Visit: Payer: Medicare HMO | Admitting: Cardiology

## 2021-03-25 VITALS — BP 138/80 | HR 82 | Ht 61.5 in | Wt 241.8 lb

## 2021-03-25 DIAGNOSIS — E782 Mixed hyperlipidemia: Secondary | ICD-10-CM | POA: Diagnosis not present

## 2021-03-25 DIAGNOSIS — I10 Essential (primary) hypertension: Secondary | ICD-10-CM | POA: Diagnosis not present

## 2021-03-25 DIAGNOSIS — R0602 Shortness of breath: Secondary | ICD-10-CM

## 2021-03-25 NOTE — Patient Instructions (Addendum)
Medication Instructions:  Continue all current medications.  Labwork: none  Testing/Procedures: none  Follow-Up: As needed.    Any Other Special Instructions Will Be Listed Below (If Applicable).  If you need a refill on your cardiac medications before your next appointment, please call your pharmacy.  

## 2021-03-25 NOTE — Progress Notes (Signed)
Clinical Summary [name] is a 65 y.o.female seen today for follow up of the following medical problems.   1. DOE - ongoing SOB for a few years, increasing - DOE with mopping, sweeping etc.  - no chest pains - no recent edema - some cough/wheezing though infrequent     CAD risk factors: DM2, HTN, HL, very remote short smoking history, father MI age 71   05/2020 echo LVEF 55-60%, grade I dd, normal RV,  05/2020 nuclear stress: no ischemia  - SOB/DOE has improved some since last visit   2. HTN  - pcp visit 132/80     3. Hyperlipidemia - followd by pcp, she is on statin - 02/2021 TC 154 HDL 53 TG 110 LDL 80 Past Medical History:  Diagnosis Date   Arthritis    "fingers occasionally" (08/26/2014)   Asthmatic bronchitis    "haven't used an inhaler in years" (08/26/2014)   Cervical cancer ([place])    Counseling for estrogen replacement therapy 12/30/2014   Depression    Diabetes mellitus without complication (HCC)    type- 2   Elevated cholesterol    Exertional shortness of breath    Family history of adverse reaction to anesthesia    "I think my mama gets sick"   GERD (gastroesophageal reflux disease)    Hot flashes 07/05/2012   Hypertension    Hypothyroidism    IFG (impaired fasting glucose)    Mixed stress and urge urinary incontinence    Moody 12/30/2014   Obesity    Panniculus    PONV (postoperative nausea and vomiting)    Rectocele 07/05/2012   Stress incontinence    Superficial fungus infection of skin      Allergies  Allergen Reactions   Naprosyn [Naproxen] Nausea Only    Can take Alleve but years ago patient took a liquid form of this medication and it made me nauseated     Current Outpatient Medications  Medication Sig Dispense Refill   acetaminophen (TYLENOL) 500 MG tablet Take 500 mg by mouth as needed.     Dulaglutide (TRULICITY) 1.5 ZO/1.0RU SOPN Inject 1.5 mg into the skin once a week. 2 mL 2   glucose blood (ONETOUCH ULTRA) test strip Use to  check blood sugar twice daily. Dx: E11.9 100 strip 5   metFORMIN (GLUCOPHAGE) 500 MG tablet Take 2 tablets (1,000 mg total) by mouth 2 (two) times daily. 360 tablet 0   NP THYROID 120 MG tablet TAKE 1 TABLET(120 MG) BY MOUTH DAILY BEFORE AND BREAKFAST 30 tablet 5   OneTouch Delica Lancets 04V MISC Use to check blood sugar twice daily. Dx: E11.9 100 each 5   rosuvastatin (CRESTOR) 20 MG tablet Take 1 tablet (20 mg total) by mouth daily. 90 tablet 3   telmisartan-hydrochlorothiazide (MICARDIS HCT) 40-12.5 MG tablet Take 1 tablet by mouth daily. 90 tablet 1   thyroid (NP THYROID) 30 MG tablet Take 1 tablet (30 mg total) by mouth daily before breakfast. 135 tablet 1   No current facility-administered medications for this visit.     Past Surgical History:  Procedure Laterality Date   ABDOMINAL HYSTERECTOMY  1986   "partial" Uterine cancer   ANKLE FUSION Left 10/24/2013   Procedure: LEFT ANKLE SUBTALAR AND TALONAVICULAR FUSION;  Surgeon: Newt Minion, MD;  Location: [facility];  Service: Orthopedics;  Laterality: Left;   ANKLE FUSION Left 08/26/2014   Procedure: Left Foot Take Down Non-union with Revision Talonavicular and Subtalar Fusion;  Surgeon:  Newt Minion, MD;  Location: [place];  Service: Orthopedics;  Laterality: Left;   BACK SURGERY     BILATERAL SALPINGECTOMY  2010   w/LOA   COLONOSCOPY     COLONOSCOPY N/A 07/14/2016   Procedure: COLONOSCOPY;  Surgeon: Danie Binder, MD;  [facility];  Service: Endoscopy;  Laterality: N/A;  8:30   FUSION OF TALONAVICULAR JOINT Left    Take Down Non-union with Revision Talonavicular and Subtalar Fusion /notes 08/26/2014   HAMMER TOE SURGERY Bilateral 2000-2013   right-left   JOINT REPLACEMENT     Right knee   KNEE ARTHROSCOPY Bilateral 2008-2010    left-right   LUMBAR DISC SURGERY  2004   Lumbar 4- 5   POLYPECTOMY  07/14/2016   Procedure: POLYPECTOMY;  Surgeon: Danie Binder, MD;  [facility];  Service: Endoscopy;;  hepatic  flexure   SHOULDER ARTHROSCOPY WITH SUBACROMIAL DECOMPRESSION, ROTATOR CUFF REPAIR AND BICEP TENDON REPAIR Right 02/11/2019   Procedure: right shoulde arthroscopy, biceps tenodesis lower trapezius tendon transfer;  Surgeon: Meredith Pel, MD;  Location: [place];  Service: Orthopedics;  Laterality: Right;   TOTAL KNEE ARTHROPLASTY Right 2013   TUBAL LIGATION       Allergies  Allergen Reactions   Naprosyn [Naproxen] Nausea Only    Can take Alleve but years ago patient took a liquid form of this medication and it made me nauseated      Family History  Problem Relation Age of Onset   Heart disease Mother    Stroke Mother    Heart disease Father    Heart attack Father        MI at age 45   Diabetes Brother    Hypertension Brother    Diabetes Brother    Hypertension Brother    Other Brother        Gastrointestional disease   Cancer Maternal Aunt        cervical   Diabetes Maternal Grandmother    Diabetes Paternal Grandmother      Social History [name] reports that she quit smoking about 33 years ago. Her smoking use included cigarettes. She has a 5.00 pack-year smoking history. She has never used smokeless tobacco. [name] reports current alcohol use.   Review of Systems CONSTITUTIONAL: No weight loss, fever, chills, weakness or fatigue.  HEENT: Eyes: No visual loss, blurred vision, double vision or yellow sclerae.No hearing loss, sneezing, congestion, runny nose or sore throat.  SKIN: No rash or itching.  CARDIOVASCULAR: per hpi RESPIRATORY: per hpi GASTROINTESTINAL: No anorexia, nausea, vomiting or diarrhea. No abdominal pain or blood.  GENITOURINARY: No burning on urination, no polyuria NEUROLOGICAL: No headache, dizziness, syncope, paralysis, ataxia, numbness or tingling in the extremities. No change in bowel or bladder control.  MUSCULOSKELETAL: No muscle, back pain, joint pain or stiffness.  LYMPHATICS: No enlarged nodes. No history of splenectomy.   PSYCHIATRIC: No history of depression or anxiety.  ENDOCRINOLOGIC: No reports of sweating, cold or heat intolerance. No polyuria or polydipsia.  Marland Kitchen   Physical Examination Today's Vitals   03/25/21 1414  BP: 138/80  Pulse: 82  SpO2: 99%  Weight: 241 lb 12.8 oz (109.7 kg)  Height: 5' 1.5" (1.562 m)   Body mass index is 44.95 kg/m.  Gen: resting comfortably, no acute distress HEENT: no scleral icterus, pupils equal round and reactive, no palptable cervical adenopathy,  CV: RRR, no m/r/g no jvd Resp: Clear to auscultation bilaterally GI: abdomen is soft,  non-tender, non-distended, normal bowel sounds, no hepatosplenomegaly MSK: extremities are warm, no edema.  Skin: warm, no rash Neuro:  no focal deficits Psych: appropriate affect   Diagnostic Studies  05/2020 echo IMPRESSIONS     1. Left ventricular ejection fraction, by estimation, is 55 to 60%. The  left ventricle has normal function. The left ventricle has no regional  wall motion abnormalities. Left ventricular diastolic parameters are  consistent with Grade I diastolic  dysfunction (impaired relaxation).   2. Right ventricular systolic function is normal. The right ventricular  size is normal.   3. Left atrial size was mildly dilated.   4. The mitral valve is normal in structure. No evidence of mitral valve  regurgitation. No evidence of mitral stenosis.   5. The aortic valve was not well visualized. Aortic valve regurgitation  is not visualized. No aortic stenosis is present.   6. The inferior vena cava is normal in size with greater than 50%  respiratory variability, suggesting right atrial pressure of 3 mmHg.    05/2020 nuclear stress Lexiscan stress is electrically negative for ischemia Myovue scan shows normal perfusion. No ischemia or scar LVEF calculated at 56% Overall low risk scan   Assessment and Plan   1. DOE/SOB - overall benign echo and stress test, no evidence her symptmos are cardiac in  origin - symptoms improving, suspect due to deconditioning and weight. Monitor at this time, if progress could consider PFTs   2. HTN -mildly elevated here but at goal at recent pcp visit, continue current meds  3. Hyperliidemia - at goal, continue current meds  F/u as needed   Arnoldo Lenis, M.D.

## 2021-04-11 ENCOUNTER — Other Ambulatory Visit: Payer: Self-pay | Admitting: *Deleted

## 2021-04-11 DIAGNOSIS — E119 Type 2 diabetes mellitus without complications: Secondary | ICD-10-CM

## 2021-04-11 MED ORDER — METFORMIN HCL 500 MG PO TABS: 1000.0000 mg | ORAL_TABLET | Freq: Two times a day (BID) | ORAL | 1 refills | Status: DC

## 2021-04-11 NOTE — Telephone Encounter (Signed)
Pharmacy requested refill

## 2021-04-19 ENCOUNTER — Ambulatory Visit: Payer: Medicare HMO | Admitting: Cardiology

## 2021-04-22 ENCOUNTER — Other Ambulatory Visit: Payer: Self-pay | Admitting: *Deleted

## 2021-04-22 DIAGNOSIS — I10 Essential (primary) hypertension: Secondary | ICD-10-CM

## 2021-04-22 MED ORDER — TELMISARTAN-HCTZ 40-12.5 MG PO TABS: 1.0000 | ORAL_TABLET | Freq: Every day | ORAL | 1 refills | Status: DC

## 2021-04-22 NOTE — Telephone Encounter (Signed)
Patient requested refill

## 2021-05-03 ENCOUNTER — Telehealth: Payer: Self-pay | Admitting: Adult Health

## 2021-05-03 MED ORDER — NYSTATIN 100000 UNIT/GM EX POWD: 1.0000 "application " | Freq: Two times a day (BID) | CUTANEOUS | 2 refills | Status: DC

## 2021-05-03 NOTE — Addendum Note (Signed)
Addended by: Derrek Monaco A on: 05/03/2021 01:34 PM   Modules accepted: Orders

## 2021-05-03 NOTE — Telephone Encounter (Signed)
Refilled nystatin powder to [facility]

## 2021-05-03 NOTE — Telephone Encounter (Signed)
Patient would like a refill on they nystatin powder sent in to her pharmacy. [facility] uses [facility] [address]. Please advise.

## 2021-05-03 NOTE — Telephone Encounter (Signed)
Pt aware that nystatin was sent to the pharmacy

## 2021-05-10 ENCOUNTER — Other Ambulatory Visit: Payer: Self-pay

## 2021-05-10 ENCOUNTER — Encounter: Payer: Self-pay | Admitting: Family

## 2021-05-10 ENCOUNTER — Ambulatory Visit (INDEPENDENT_AMBULATORY_CARE_PROVIDER_SITE_OTHER): Payer: Medicare HMO | Admitting: Family

## 2021-05-10 VITALS — BP 118/74 | HR 73 | Temp 97.3°F | Resp 18 | Ht 61.5 in | Wt 235.8 lb

## 2021-05-10 DIAGNOSIS — H9202 Otalgia, left ear: Secondary | ICD-10-CM | POA: Diagnosis not present

## 2021-05-10 DIAGNOSIS — E119 Type 2 diabetes mellitus without complications: Secondary | ICD-10-CM | POA: Diagnosis not present

## 2021-05-10 DIAGNOSIS — J0100 Acute maxillary sinusitis, unspecified: Secondary | ICD-10-CM | POA: Diagnosis not present

## 2021-05-10 MED ORDER — DOXYCYCLINE HYCLATE 100 MG PO TABS: 100.0000 mg | ORAL_TABLET | Freq: Two times a day (BID) | ORAL | 0 refills | Status: AC

## 2021-05-10 NOTE — Patient Instructions (Signed)
-   Take Extra strength 500 mg tablet one by mouth every 8 hrs as needed  - Notify provider if symptoms worsen or fail to improve

## 2021-05-10 NOTE — Progress Notes (Signed)
Provider: Lyndall Bellot FNP-C  Lauree Chandler, NP  Patient Care Team: Lauree Chandler, NP as PCP - General (Geriatric Medicine) Arnoldo Lenis, MD as PCP - Cardiology (Cardiology)  Extended Emergency Contact Information Primary Emergency Contact: Kotas,Phillip "D[place]on" Address: [address]          [place], [place] [address] Johnnette Litter of [place] Phone: [phone] Mobile Phone: [phone] Relation: Spouse  Code Status:  Full Code  Goals of care: Advanced Directive information Advanced Directives 05/10/2021  Does Patient Have a Medical Advance Directive? No  Would patient like information on creating a medical advance directive? No - Patient declined  Pre-existing out of facility DNR order (yellow form or pink MOST form) -     Chief Complaint  Patient presents with   Acute Visit    Patient complains of head cold, sinus issues, and left ear discomfort.     HPI:  Pt is a 66 y.o. female seen today for an acute visit for evaluation of head cold ,sinus and left ear discomfort.  Had a cold a month ago.left ear feels muffled and ringing.tries to pull ear to unblock.Has used sweets oil.heat outside the ear without any relief.   Sinus - using OTC sudafed.has nasal drainage and sinus pressure.she denies any fever,chills or cough.   Past Medical History:  Diagnosis Date   Arthritis    "fingers occasionally" (08/26/2014)   Asthmatic bronchitis    "haven't used an inhaler in years" (08/26/2014)   Cervical cancer ([place])    Counseling for estrogen replacement therapy 12/30/2014   Depression    Diabetes mellitus without complication (HCC)    type- 2   Elevated cholesterol    Exertional shortness of breath    Family history of adverse reaction to anesthesia    "I think my mama gets sick"   GERD (gastroesophageal reflux disease)    Hot flashes 07/05/2012   Hypertension    Hypothyroidism    IFG (impaired fasting glucose)    Mixed stress and urge urinary incontinence     Moody 12/30/2014   Obesity    Panniculus    PONV (postoperative nausea and vomiting)    Rectocele 07/05/2012   Stress incontinence    Superficial fungus infection of skin    Past Surgical History:  Procedure Laterality Date   ABDOMINAL HYSTERECTOMY  1986   "partial" Uterine cancer   ANKLE FUSION Left 10/24/2013   Procedure: LEFT ANKLE SUBTALAR AND TALONAVICULAR FUSION;  Surgeon: Newt Minion, MD;  Location: [place] [facility];  Service: Orthopedics;  Laterality: Left;   ANKLE FUSION Left 08/26/2014   Procedure: Left Foot Take Down Non-union with Revision Talonavicular and Subtalar Fusion;  Surgeon: Newt Minion, MD;  Location: [place];  Service: Orthopedics;  Laterality: Left;   BACK SURGERY     BILATERAL SALPINGECTOMY  2010   w/LOA   COLONOSCOPY     COLONOSCOPY N/A 07/14/2016   Procedure: COLONOSCOPY;  Surgeon: Danie Binder, MD;  [facility];  Service: Endoscopy;  Laterality: N/A;  8:30   FUSION OF TALONAVICULAR JOINT Left    Take Down Non-union with Revision Talonavicular and Subtalar Fusion /notes 08/26/2014   HAMMER TOE SURGERY Bilateral 2000-2013   right-left   JOINT REPLACEMENT     Right knee   KNEE ARTHROSCOPY Bilateral 2008-2010    left-right   LUMBAR DISC SURGERY  2004   Lumbar 4- 5   POLYPECTOMY  07/14/2016   Procedure: POLYPECTOMY;  Surgeon: Marga Melnick  Fields, MD;  [facility];  Service: Endoscopy;;  hepatic flexure   SHOULDER ARTHROSCOPY WITH SUBACROMIAL DECOMPRESSION, ROTATOR CUFF REPAIR AND BICEP TENDON REPAIR Right 02/11/2019   Procedure: right shoulde arthroscopy, biceps tenodesis lower trapezius tendon transfer;  Surgeon: Meredith Pel, MD;  Location: [facility];  Service: Orthopedics;  Laterality: Right;   TOTAL KNEE ARTHROPLASTY Right 2013   TUBAL LIGATION      Allergies  Allergen Reactions   Naprosyn [Naproxen] Nausea Only    Can take Alleve but years ago patient took a liquid form of this medication and it made me nauseated    Outpatient  Encounter Medications as of 05/10/2021  Medication Sig   acetaminophen (TYLENOL) 500 MG tablet Take 500 mg by mouth as needed.   Dulaglutide (TRULICITY) 1.5 DD/2.2GU SOPN Inject 1.5 mg into the skin once a week.   glucose blood (ONETOUCH ULTRA) test strip Use to check blood sugar twice daily. Dx: E11.9   metFORMIN (GLUCOPHAGE) 500 MG tablet Take 2 tablets (1,000 mg total) by mouth 2 (two) times daily.   NP THYROID 120 MG tablet TAKE 1 TABLET(120 MG) BY MOUTH DAILY BEFORE AND BREAKFAST   nystatin (MYCOSTATIN/NYSTOP) powder Apply 1 application topically 2 (two) times daily.   OneTouch Delica Lancets 54Y MISC Use to check blood sugar twice daily. Dx: E11.9   rosuvastatin (CRESTOR) 20 MG tablet Take 1 tablet (20 mg total) by mouth daily.   telmisartan-hydrochlorothiazide (MICARDIS HCT) 40-12.5 MG tablet Take 1 tablet by mouth daily.   thyroid (NP THYROID) 30 MG tablet Take 1 tablet (30 mg total) by mouth daily before breakfast.   No facility-administered encounter medications on file as of 05/10/2021.    Review of Systems  Constitutional:  Negative for chills, fatigue and fever.  HENT:  Positive for congestion, ear pain, postnasal drip, rhinorrhea, sinus pressure, sinus pain and tinnitus. Negative for ear discharge, facial swelling, hearing loss, sneezing, sore throat and trouble swallowing.   Eyes:  Negative for pain, discharge, redness, itching and visual disturbance.  Respiratory:  Negative for cough, chest tightness, shortness of breath and wheezing.   Cardiovascular:  Negative for chest pain, palpitations and leg swelling.  Skin:  Negative for color change, pallor and rash.  Neurological:  Negative for dizziness and light-headedness.       Sinus headache   Psychiatric/Behavioral:  Negative for agitation and confusion.    Immunization History  Administered Date(s) Administered   Influenza Inj Mdck Quad Pf 01/06/2018   Influenza,inj,Quad PF,6+ Mos 01/12/2015, 12/03/2018, 12/25/2020    Influenza-Unspecified 01/08/2014, 01/16/2016, 01/06/2018   Moderna Sars-Covid-2 Vaccination 01/08/2020, 02/05/2020   Pneumococcal Polysaccharide-23 01/13/2014   Tdap 09/04/2016   Pertinent  Health Maintenance Due  Topic Date Due   OPHTHALMOLOGY EXAM  12/09/2020   DEXA SCAN  Never done   HEMOGLOBIN A1C  08/26/2021   FOOT EXAM  09/09/2021   MAMMOGRAM  11/19/2021   COLONOSCOPY (Pts 45-70yrs Insurance coverage will need to be confirmed)  07/15/2026   INFLUENZA VACCINE  Completed   PAP SMEAR-Modifier  Discontinued   Fall Risk 11/28/2018 02/11/2019 12/01/2020 03/07/2021 05/10/2021  Falls in the past year? 1 - 0 0 0  Was there an injury with Fall? 1 - 0 0 0  Fall Risk Category Calculator 3 - 0 0 0  Fall Risk Category High - Low Low Low  Patient Fall Risk Level Low fall risk Moderate fall risk Low fall risk Low fall risk Low fall risk  Patient at Risk for  Falls Due to - - No Fall Risks No Fall Risks No Fall Risks  Fall risk Follow up - - Falls evaluation completed Falls evaluation completed Falls evaluation completed   Functional Status Survey:    Vitals:   05/10/21 1052  BP: 118/74  Pulse: 73  Resp: 18  Temp: (!) 97.3 F (36.3 C)  SpO2: 97%  Weight: 235 lb 12.8 oz (107 kg)  Height: 5' 1.5" (1.562 m)   Body mass index is 43.83 kg/m. Physical Exam Vitals reviewed.  Constitutional:      General: She is not in acute distress.    Appearance: Normal appearance. She is normal weight. She is not ill-appearing or diaphoretic.  HENT:     Head: Normocephalic.     Right Ear: Tympanic membrane, ear canal and external ear normal. There is no impacted cerumen.     Left Ear: Tympanic membrane, ear canal and external ear normal. There is no impacted cerumen.     Ears:     Comments: No erythema ,tenderness or drainage noted on left ear     Nose: Congestion and rhinorrhea present.     Right Turbinates: Not enlarged or swollen.     Left Turbinates: Not enlarged or swollen.     Right Sinus:  Maxillary sinus tenderness and frontal sinus tenderness present.     Left Sinus: Maxillary sinus tenderness and frontal sinus tenderness present.     Mouth/Throat:     Mouth: Mucous membranes are moist.     Pharynx: Oropharynx is clear. No oropharyngeal exudate or posterior oropharyngeal erythema.  Eyes:     General: No scleral icterus.       Right eye: No discharge.        Left eye: No discharge.     Conjunctiva/sclera: Conjunctivae normal.     Pupils: Pupils are equal, round, and reactive to light.  Neck:     Vascular: No carotid bruit.  Cardiovascular:     Rate and Rhythm: Normal rate and regular rhythm.     Pulses: Normal pulses.     Heart sounds: Normal heart sounds. No murmur heard.   No friction rub. No gallop.  Pulmonary:     Effort: Pulmonary effort is normal. No respiratory distress.     Breath sounds: Normal breath sounds. No wheezing, rhonchi or rales.  Chest:     Chest wall: No tenderness.  Musculoskeletal:     Cervical back: Normal range of motion. No rigidity or tenderness.  Lymphadenopathy:     Head:     Right side of head: Tonsillar adenopathy present. No submandibular, preauricular or posterior auricular adenopathy.     Left side of head: Tonsillar adenopathy present. No submandibular, preauricular or posterior auricular adenopathy.     Cervical: No cervical adenopathy.  Skin:    General: Skin is warm and dry.     Coloration: Skin is not pale.     Findings: No erythema or rash.  Neurological:     Mental Status: She is alert and oriented to person, place, and time.     Motor: No weakness.     Gait: Gait normal.  Psychiatric:        Mood and Affect: Mood normal.        Speech: Speech normal.        Behavior: Behavior normal.        Thought Content: Thought content normal.        Judgment: Judgment normal.    Labs reviewed: Recent Labs  09/09/20 1021 12/01/20 1417 02/25/21 0859  NA 141 136 138  K 3.7 4.5 4.6  CL 110 101 103  CO2 24 26 26    GLUCOSE 141* 368* 145*  BUN 14 28* 23  CREATININE 0.54 0.83 0.71  CALCIUM 9.1 9.9 9.7   Recent Labs    12/01/20 1417 02/25/21 0859  AST 17 18  ALT 15 13  BILITOT 0.3 0.5  PROT 7.3 6.8   Recent Labs    12/01/20 1417  WBC 7.7  NEUTROABS 4,897  HGB 12.8  HCT 39.0  MCV 90.1  PLT 282   Lab Results  Component Value Date   TSH 0.07 (L) 02/25/2021   Lab Results  Component Value Date   HGBA1C 7.0 (H) 02/25/2021   Lab Results  Component Value Date   CHOL 154 02/25/2021   HDL 53 02/25/2021   LDLCALC 80 02/25/2021   TRIG 110 02/25/2021   CHOLHDL 2.9 02/25/2021    Significant Diagnostic Results in last 30 days:  No results found.  Assessment/Plan  Acute non-recurrent maxillary sinusitis Afebrile. Bilateral maxillary and frontal sinus tenderness on percussion. - encouraged fluid intake  - start on doxy as below due to ongoing symptoms for several weeks  - doxycycline (VIBRA-TABS) 100 MG tablet; Take 1 tablet (100 mg total) by mouth 2 (two) times daily for 10 days.  Dispense: 20 tablet; Refill: 0  2. Earache on left Enlarge tonsillar lymph node noted suspect referred pain to ear.sinusitis could be  a contributory factor to her pain,mufflle in the ear. TM and canal clear.No drainage. - Extra strength Tylenol as needed for pain  - Notify provider if symptoms worsen or fail to improve. May consider Otic antibiotic if no relief  - continue OTC antihistamine    Family/ staff Communication: Reviewed plan of care with patient verbalized understanding   Labs/tests ordered: None   Next Appointment: As needed if symptoms worsen or fail to improve    Sandrea Hughs, NP

## 2021-05-27 ENCOUNTER — Other Ambulatory Visit: Payer: Self-pay | Admitting: Nurse Practitioner

## 2021-05-27 DIAGNOSIS — E119 Type 2 diabetes mellitus without complications: Secondary | ICD-10-CM

## 2021-06-02 ENCOUNTER — Ambulatory Visit: Payer: Medicare HMO | Admitting: Orthopedic Surgery

## 2021-06-10 ENCOUNTER — Ambulatory Visit (INDEPENDENT_AMBULATORY_CARE_PROVIDER_SITE_OTHER): Payer: Medicare HMO | Admitting: Nurse Practitioner

## 2021-06-10 ENCOUNTER — Encounter: Payer: Self-pay | Admitting: Nurse Practitioner

## 2021-06-10 ENCOUNTER — Other Ambulatory Visit: Payer: Self-pay

## 2021-06-10 VITALS — BP 126/82 | HR 78 | Temp 97.3°F | Ht 61.5 in | Wt 240.0 lb

## 2021-06-10 DIAGNOSIS — I10 Essential (primary) hypertension: Secondary | ICD-10-CM

## 2021-06-10 DIAGNOSIS — Z1159 Encounter for screening for other viral diseases: Secondary | ICD-10-CM

## 2021-06-10 DIAGNOSIS — E039 Hypothyroidism, unspecified: Secondary | ICD-10-CM

## 2021-06-10 DIAGNOSIS — E119 Type 2 diabetes mellitus without complications: Secondary | ICD-10-CM

## 2021-06-10 DIAGNOSIS — E785 Hyperlipidemia, unspecified: Secondary | ICD-10-CM

## 2021-06-10 DIAGNOSIS — Z1231 Encounter for screening mammogram for malignant neoplasm of breast: Secondary | ICD-10-CM

## 2021-06-10 DIAGNOSIS — R69 Illness, unspecified: Secondary | ICD-10-CM | POA: Diagnosis not present

## 2021-06-10 DIAGNOSIS — E2839 Other primary ovarian failure: Secondary | ICD-10-CM | POA: Diagnosis not present

## 2021-06-10 DIAGNOSIS — Z23 Encounter for immunization: Secondary | ICD-10-CM

## 2021-06-10 DIAGNOSIS — Z114 Encounter for screening for human immunodeficiency virus [HIV]: Secondary | ICD-10-CM

## 2021-06-10 NOTE — Progress Notes (Signed)
? ? ?Careteam: ?Patient Care Team: ?Lauree Chandler, NP as PCP - General (Geriatric Medicine) ?Arnoldo Lenis, MD as PCP - Cardiology (Cardiology) ? ?PLACE OF SERVICE:  ?[facility]  ?Advanced Directive information ?Does Patient Have a Medical Advance Directive?: No, Would patient like information on creating a medical advance directive?: No - Patient declined ? ?Allergies  ?Allergen Reactions  ? Naprosyn [Naproxen] Nausea Only  ?  Can take Alleve but years ago patient took a liquid form of this medication and it made me nauseated  ? ? ?Chief Complaint  ?Patient presents with  ? Medical Management of Chronic Issues  ?  3 month follow-up. Discuss need for HIV screening, hep c screening, shingrix, PCV, covid boosters, eye exam and DEXA or post pone if patient refuses. NCIR verified. Patient denies receiving any vaccines since last visit.  ?  ? Medication Management  ?  Discuss changing thyroid medication and trulicity   ? ? ? ?HPI: Patient is a 66 y.o. female for routine follow up.  ? ?Due for pneumonia vaccine, agreeable to get today ? ?She has had recent follow up with ophthalmology, will request records.  ? ?Hypothyroid- recently decrease thyroid to 150 mg down from 165 mg- will follow up TSH today ? ?DM- reports fasting blood sugar this morning was 160 mg. ?She is not having seconds when she eats.  ?She can not lose weight.  ?Taking trulicity 1.5 mg weekly.  ?Eating cornflakes, sugar pop.  ? ?Mac and cheese, egg  ? ?And snacks occasionally (oatmeal cookies, unicorn cake)  ? ? ? ?Review of Systems:  ?Review of Systems  ?Constitutional:  Negative for chills, fever and weight loss.  ?HENT:  Positive for congestion. Negative for tinnitus.   ?Respiratory:  Negative for cough, sputum production and shortness of breath.   ?Cardiovascular:  Negative for chest pain, palpitations and leg swelling.  ?Gastrointestinal:  Negative for abdominal pain, constipation, diarrhea and heartburn.  ?Genitourinary:  Negative for  dysuria, frequency and urgency.  ?Musculoskeletal:  Negative for back pain, falls, joint pain and myalgias.  ?Skin: Negative.   ?Neurological:  Negative for dizziness and headaches.  ?Psychiatric/Behavioral:  Negative for depression and memory loss. The patient does not have insomnia.   ? ?Past Medical History:  ?Diagnosis Date  ? Arthritis   ? "fingers occasionally" (08/26/2014)  ? Asthmatic bronchitis   ? "haven't used an inhaler in years" (08/26/2014)  ? Cervical cancer ([place])   ? Counseling for estrogen replacement therapy 12/30/2014  ? Depression   ? Diabetes mellitus without complication ([place])   ? type- 2  ? Elevated cholesterol   ? Exertional shortness of breath   ? Family history of adverse reaction to anesthesia   ? "I think my mama gets sick"  ? GERD (gastroesophageal reflux disease)   ? Hot flashes 07/05/2012  ? Hypertension   ? Hypothyroidism   ? IFG (impaired fasting glucose)   ? Mixed stress and urge urinary incontinence   ? Moody 12/30/2014  ? Obesity   ? Panniculus   ? PONV (postoperative nausea and vomiting)   ? Rectocele 07/05/2012  ? Stress incontinence   ? Superficial fungus infection of skin   ? ?Past Surgical History:  ?Procedure Laterality Date  ? ABDOMINAL HYSTERECTOMY  1986  ? "partial" Uterine cancer  ? ANKLE FUSION Left 10/24/2013  ? Procedure: LEFT ANKLE SUBTALAR AND TALONAVICULAR FUSION;  Surgeon: Newt Minion, MD;  Location: [place];  Service: Orthopedics;  Laterality: Left;  ?  ANKLE FUSION Left 08/26/2014  ? Procedure: Left Foot Take Down Non-union with Revision Talonavicular and Subtalar Fusion;  Surgeon: Newt Minion, MD;  Location: [place];  Service: Orthopedics;  Laterality: Left;  ? BACK SURGERY    ? BILATERAL SALPINGECTOMY  2010  ? w/LOA  ? COLONOSCOPY    ? COLONOSCOPY N/A 07/14/2016  ? Procedure: COLONOSCOPY;  Surgeon: Danie Binder, MD;  [facility];  Service: Endoscopy;  Laterality: N/A;  8:30  ? FUSION OF TALONAVICULAR JOINT Left   ? Take Down Non-union with Revision  Talonavicular and Subtalar Fusion /notes 08/26/2014  ? HAMMER TOE SURGERY Bilateral 2000-2013  ? right-left  ? JOINT REPLACEMENT    ? Right knee  ? KNEE ARTHROSCOPY Bilateral 2008-2010   ? left-right  ? [place] SURGERY  2004  ? Lumbar 4- 5  ? POLYPECTOMY  07/14/2016  ? Procedure: POLYPECTOMY;  Surgeon: Danie Binder, MD;  [facility];  Service: Endoscopy;;  hepatic flexure  ? SHOULDER ARTHROSCOPY WITH SUBACROMIAL DECOMPRESSION, ROTATOR CUFF REPAIR AND BICEP TENDON REPAIR Right 02/11/2019  ? Procedure: right shoulde arthroscopy, biceps tenodesis lower trapezius tendon transfer;  Surgeon: Meredith Pel, MD;  Location: [place];  Service: Orthopedics;  Laterality: Right;  ? TOTAL KNEE ARTHROPLASTY Right 2013  ? TUBAL LIGATION    ? ?Social History: ?  reports that she quit smoking about 33 years ago. Her smoking use included cigarettes. She has a 5.00 pack-year smoking history. She has never used smokeless tobacco. She reports current alcohol use. She reports that she does not use drugs. ? ?Family History  ?Problem Relation Age of Onset  ? Heart disease Mother   ? Stroke Mother   ? Heart disease Father   ? Heart attack Father   ?     MI at age 42  ? Diabetes Brother   ? Hypertension Brother   ? Diabetes Brother   ? Hypertension Brother   ? Other Brother   ?     Gastrointestional disease  ? Cancer Maternal Aunt   ?     cervical  ? Diabetes Maternal Grandmother   ? Diabetes Paternal Grandmother   ? ? ?Medications: ?Patient's Medications  ?New Prescriptions  ? No medications on file  ?Previous Medications  ? ACETAMINOPHEN (TYLENOL) 500 MG TABLET    Take 500 mg by mouth as needed.  ? DULAGLUTIDE (TRULICITY) 1.5 AU/6.3FH SOPN    INJECT 1.5 MG INTO THE SKIN ONCE A WEEK.  ? GLUCOSE BLOOD (ONETOUCH ULTRA) TEST STRIP    Use to check blood sugar twice daily. Dx: E11.9  ? METFORMIN (GLUCOPHAGE) 500 MG TABLET    Take 2 tablets (1,000 mg total) by mouth 2 (two) times daily.  ? NP THYROID 120 MG TABLET    TAKE 1  TABLET(120 MG) BY MOUTH DAILY BEFORE AND BREAKFAST  ? NYSTATIN (MYCOSTATIN/NYSTOP) POWDER    Apply 1 application topically 2 (two) times daily.  ? ONETOUCH DELICA LANCETS 54T MISC    Use to check blood sugar twice daily. Dx: E11.9  ? ROSUVASTATIN (CRESTOR) 20 MG TABLET    Take 1 tablet (20 mg total) by mouth daily.  ? TELMISARTAN-HYDROCHLOROTHIAZIDE (MICARDIS HCT) 40-12.5 MG TABLET    Take 1 tablet by mouth daily.  ? THYROID (NP THYROID) 30 MG TABLET    Take 1 tablet (30 mg total) by mouth daily before breakfast.  ?Modified Medications  ? No medications on file  ?Discontinued Medications  ? No  medications on file  ? ? ?Physical Exam: ? ?Vitals:  ? 06/10/21 1035  ?BP: 126/82  ?Pulse: 78  ?Temp: (!) 97.3 ?F (36.3 ?C)  ?TempSrc: Temporal  ?SpO2: 96%  ?Weight: 240 lb (108.9 kg)  ?Height: 5' 1.5" (1.562 m)  ? ?Body mass index is 44.61 kg/m?. ?Wt Readings from Last 3 Encounters:  ?06/10/21 240 lb (108.9 kg)  ?05/10/21 235 lb 12.8 oz (107 kg)  ?03/25/21 241 lb 12.8 oz (109.7 kg)  ? ? ?Physical Exam ?Constitutional:   ?   General: She is not in acute distress. ?   Appearance: She is well-developed. She is not diaphoretic.  ?HENT:  ?   Head: Normocephalic and atraumatic.  ?   Mouth/Throat:  ?   Pharynx: No oropharyngeal exudate.  ?Eyes:  ?   Conjunctiva/sclera: Conjunctivae normal.  ?   Pupils: Pupils are equal, round, and reactive to light.  ?Cardiovascular:  ?   Rate and Rhythm: Normal rate and regular rhythm.  ?   Heart sounds: Normal heart sounds.  ?Pulmonary:  ?   Effort: Pulmonary effort is normal.  ?   Breath sounds: Normal breath sounds.  ?Abdominal:  ?   General: Bowel sounds are normal.  ?   Palpations: Abdomen is soft.  ?Musculoskeletal:  ?   Cervical back: Normal range of motion and neck supple.  ?   Right lower leg: No edema.  ?   Left lower leg: No edema.  ?Skin: ?   General: Skin is warm and dry.  ?Neurological:  ?   Mental Status: She is alert and oriented to person, place, and time. Mental status is at  baseline.  ?Psychiatric:     ?   Mood and Affect: Mood normal.  ? ? ?Labs reviewed: ?Basic Metabolic Panel: ?Recent Labs  ?  09/09/20 ?1021 12/01/20 ?1417 02/25/21 ?0859  ?NA 141 136 138  ?K 3.7 4.5 4.6  ?CL 110 101 103

## 2021-06-10 NOTE — Patient Instructions (Addendum)
Please sign a record release for [facility] on check out, for eye exam.  ? ? ?Set time to meal prep and bring food with you to babysit if you have to.  ? ? ? ?  ?Start zyrtec 10 mg by mouth daily  ?Increase water intake ?To use saline in nose as needed ?Flonase (over the counter) 1-2 sprays into nares daily as needed  ?

## 2021-06-13 LAB — COMPLETE METABOLIC PANEL WITH GFR
AG Ratio: 1.6 (calc) (ref 1.0–2.5)
ALT: 18 U/L (ref 6–29)
AST: 24 U/L (ref 10–35)
Albumin: 4.2 g/dL (ref 3.6–5.1)
Alkaline phosphatase (APISO): 67 U/L (ref 37–153)
BUN: 17 mg/dL (ref 7–25)
CO2: 27 mmol/L (ref 20–32)
Calcium: 9.5 mg/dL (ref 8.6–10.4)
Chloride: 105 mmol/L (ref 98–110)
Creat: 0.8 mg/dL (ref 0.50–1.05)
Globulin: 2.7 g/dL (calc) (ref 1.9–3.7)
Glucose, Bld: 115 mg/dL — ABNORMAL HIGH (ref 65–99)
Potassium: 4.4 mmol/L (ref 3.5–5.3)
Sodium: 141 mmol/L (ref 135–146)
Total Bilirubin: 0.3 mg/dL (ref 0.2–1.2)
Total Protein: 6.9 g/dL (ref 6.1–8.1)
eGFR: 82 mL/min/{1.73_m2} (ref >=60)

## 2021-06-13 LAB — HIV ANTIBODY (ROUTINE TESTING W REFLEX): HIV 1&2 Ab, 4th Generation: NONREACTIVE

## 2021-06-13 LAB — HEMOGLOBIN A1C
Hgb A1c MFr Bld: 7 % of total Hgb — ABNORMAL HIGH (ref <5.7)
Mean Plasma Glucose: 154 mg/dL
eAG (mmol/L): 8.5 mmol/L

## 2021-06-13 LAB — TSH: TSH: 0.1 mIU/L — ABNORMAL LOW (ref 0.40–4.50)

## 2021-06-13 LAB — HEPATITIS C ANTIBODY
Hepatitis C Ab: NONREACTIVE
SIGNAL TO CUT-OFF: 0.07 (ref <1.00)

## 2021-06-14 ENCOUNTER — Telehealth: Payer: Self-pay

## 2021-06-14 ENCOUNTER — Other Ambulatory Visit: Payer: Self-pay

## 2021-06-14 ENCOUNTER — Telehealth (INDEPENDENT_AMBULATORY_CARE_PROVIDER_SITE_OTHER): Payer: Medicare HMO | Admitting: Nurse Practitioner

## 2021-06-14 DIAGNOSIS — E039 Hypothyroidism, unspecified: Secondary | ICD-10-CM | POA: Diagnosis not present

## 2021-06-14 DIAGNOSIS — E785 Hyperlipidemia, unspecified: Secondary | ICD-10-CM | POA: Diagnosis not present

## 2021-06-14 DIAGNOSIS — E1169 Type 2 diabetes mellitus with other specified complication: Secondary | ICD-10-CM | POA: Diagnosis not present

## 2021-06-14 MED ORDER — THYROID 15 MG PO TABS: 15.0000 mg | ORAL_TABLET | Freq: Every day | ORAL | 0 refills | Status: DC

## 2021-06-14 NOTE — Telephone Encounter (Signed)
[name], [name] are scheduled for a virtual visit with your provider today.   ? ?Just as we do with appointments in the office, we must obtain your consent to participate.  Your consent will be active for this visit and any virtual visit you may have with one of our providers in the next 365 days.   ? ?If you have a MyChart account, I can also send a copy of this consent to you electronically.  All virtual visits are billed to your insurance company just like a traditional visit in the office.  As this is a virtual visit, video technology does not allow for your provider to perform a traditional examination.  This may limit your provider's ability to fully assess your condition.  If your provider identifies any concerns that need to be evaluated in person or the need to arrange testing such as labs, EKG, etc, we will make arrangements to do so.   ? ?Although advances in technology are sophisticated, we cannot ensure that it will always work on either your end or our end.  If the connection with a video visit is poor, we may have to switch to a telephone visit.  With either a video or telephone visit, we are not always able to ensure that we have a secure connection.   I need to obtain your verbal consent now.   Are you willing to proceed with your visit today?  ? ?[name] has provided verbal consent on 06/14/2021 for a virtual visit (video or telephone). ? ? ?Carroll Kinds, CMA ?06/14/2021  9:21 AM ?  ?

## 2021-06-14 NOTE — Progress Notes (Signed)
This service is provided via telemedicine ? ?No vital signs collected/recorded due to the encounter was a telemedicine visit.  ? ?Location of patient (ex: home, work):  Home ? ?Patient consents to a telephone visit:  Yes, see encounter dated 06/14/2021 ? ?Location of the provider (ex: office, home):  [place] ? ?Name of any referring provider:  N/A ? ?Names of all persons participating in the telemedicine service and their role in the encounter:  Sherrie Mustache, Nurse Practitioner, Carroll Kinds, CMA, and patient.  ? ?Time spent on call:  8 minutes with medical assistant ? ?

## 2021-06-14 NOTE — Progress Notes (Signed)
? ? ?Careteam: ?Patient Care Team: ?Lauree Chandler, NP as PCP - General (Geriatric Medicine) ?Arnoldo Lenis, MD as PCP - Cardiology (Cardiology) ? ?Advanced Directive information ?  ? ?Allergies  ?Allergen Reactions  ? Naprosyn [Naproxen] Nausea Only  ?  Can take Alleve but years ago patient took a liquid form of this medication and it made me nauseated  ? ? ?Chief Complaint  ?Patient presents with  ? Acute Visit  ?  Discuss recent lab results and medications. Patient would like to discuss possible changes to diabetic medications.  Patient states she has lost weight and her Hemoglobin A1C has not changed. Her eating habits are not bad.   ? ? ? ?HPI: Patient is a 66 y.o. female to discuss labs.  ?Questions if she needs more medication ?Reports yesterday she did go on a walk and increase her physical activity as well as drank more water.  ?Used to drink water vs sugary drinks but has gotten away from that.  ?She is currently taking trulicity 1.5 mg- 3 months ago increased from 7.5 mg weekly  ?Also taking metformin 1000 mg BID with meals.  ?Not much physical activity but trying to increase at this time ? ?Reports she has always had trouble with her thyroid.  ?TSH remains low but improved from 3 months ago with decrease in medication  ? ?Review of Systems:  ?Review of Systems  ?Constitutional:  Negative for chills, fever and weight loss.  ?HENT: Negative.    ?Respiratory: Negative.    ?Cardiovascular: Negative.   ?Musculoskeletal: Negative.   ?Neurological: Negative.   ?Psychiatric/Behavioral: Negative.    ?  ?Past Medical History:  ?Diagnosis Date  ? Arthritis   ? "fingers occasionally" (08/26/2014)  ? Asthmatic bronchitis   ? "haven't used an inhaler in years" (08/26/2014)  ? Cervical cancer ([place])   ? Counseling for estrogen replacement therapy 12/30/2014  ? Depression   ? Diabetes mellitus without complication ([place])   ? type- 2  ? Elevated cholesterol   ? Exertional shortness of breath   ? Family history of  adverse reaction to anesthesia   ? "I think my mama gets sick"  ? GERD (gastroesophageal reflux disease)   ? Hot flashes 07/05/2012  ? Hypertension   ? Hypothyroidism   ? IFG (impaired fasting glucose)   ? Mixed stress and urge urinary incontinence   ? Moody 12/30/2014  ? Obesity   ? Panniculus   ? PONV (postoperative nausea and vomiting)   ? Rectocele 07/05/2012  ? Stress incontinence   ? Superficial fungus infection of skin   ? ?Past Surgical History:  ?Procedure Laterality Date  ? ABDOMINAL HYSTERECTOMY  1986  ? "partial" Uterine cancer  ? ANKLE FUSION Left 10/24/2013  ? Procedure: LEFT ANKLE SUBTALAR AND TALONAVICULAR FUSION;  Surgeon: Newt Minion, MD;  Location: [place];  Service: Orthopedics;  Laterality: Left;  ? ANKLE FUSION Left 08/26/2014  ? Procedure: Left Foot Take Down Non-union with Revision Talonavicular and Subtalar Fusion;  Surgeon: Newt Minion, MD;  Location: [place];  Service: Orthopedics;  Laterality: Left;  ? BACK SURGERY    ? BILATERAL SALPINGECTOMY  2010  ? w/LOA  ? COLONOSCOPY    ? COLONOSCOPY N/A 07/14/2016  ? Procedure: COLONOSCOPY;  Surgeon: Danie Binder, MD;  [facility];  Service: Endoscopy;  Laterality: N/A;  8:30  ? FUSION OF TALONAVICULAR JOINT Left   ? Take Down Non-union with Revision Talonavicular and Subtalar Fusion /notes 08/26/2014  ?  HAMMER TOE SURGERY Bilateral 2000-2013  ? right-left  ? JOINT REPLACEMENT    ? Right knee  ? KNEE ARTHROSCOPY Bilateral 2008-2010   ? left-right  ? [place] SURGERY  2004  ? Lumbar 4- 5  ? POLYPECTOMY  07/14/2016  ? Procedure: POLYPECTOMY;  Surgeon: Danie Binder, MD;  [facility];  Service: Endoscopy;;  hepatic flexure  ? SHOULDER ARTHROSCOPY WITH SUBACROMIAL DECOMPRESSION, ROTATOR CUFF REPAIR AND BICEP TENDON REPAIR Right 02/11/2019  ? Procedure: right shoulde arthroscopy, biceps tenodesis lower trapezius tendon transfer;  Surgeon: Meredith Pel, MD;  Location: [place];  Service: Orthopedics;  Laterality: Right;  ? TOTAL  KNEE ARTHROPLASTY Right 2013  ? TUBAL LIGATION    ? ?Social History: ?  reports that she quit smoking about 33 years ago. Her smoking use included cigarettes. She has a 5.00 pack-year smoking history. She has never used smokeless tobacco. She reports current alcohol use. She reports that she does not use drugs. ? ?Family History  ?Problem Relation Age of Onset  ? Heart disease Mother   ? Stroke Mother   ? Heart disease Father   ? Heart attack Father   ?     MI at age 63  ? Diabetes Brother   ? Hypertension Brother   ? Diabetes Brother   ? Hypertension Brother   ? Other Brother   ?     Gastrointestional disease  ? Cancer Maternal Aunt   ?     cervical  ? Diabetes Maternal Grandmother   ? Diabetes Paternal Grandmother   ? ? ?Medications: ?Patient's Medications  ?New Prescriptions  ? No medications on file  ?Previous Medications  ? ACETAMINOPHEN (TYLENOL) 500 MG TABLET    Take 500 mg by mouth as needed.  ? DULAGLUTIDE (TRULICITY) 1.5 ER/7.4YC SOPN    INJECT 1.5 MG INTO THE SKIN ONCE A WEEK.  ? GLUCOSE BLOOD (ONETOUCH ULTRA) TEST STRIP    Use to check blood sugar twice daily. Dx: E11.9  ? METFORMIN (GLUCOPHAGE) 500 MG TABLET    Take 2 tablets (1,000 mg total) by mouth 2 (two) times daily.  ? NP THYROID 120 MG TABLET    TAKE 1 TABLET(120 MG) BY MOUTH DAILY BEFORE AND BREAKFAST  ? NYSTATIN (MYCOSTATIN/NYSTOP) POWDER    Apply 1 application topically 2 (two) times daily.  ? ONETOUCH DELICA LANCETS 14G MISC    Use to check blood sugar twice daily. Dx: E11.9  ? ROSUVASTATIN (CRESTOR) 20 MG TABLET    Take 1 tablet (20 mg total) by mouth daily.  ? TELMISARTAN-HYDROCHLOROTHIAZIDE (MICARDIS HCT) 40-12.5 MG TABLET    Take 1 tablet by mouth daily.  ? THYROID (NP THYROID) 30 MG TABLET    Take 1 tablet (30 mg total) by mouth daily before breakfast.  ?Modified Medications  ? No medications on file  ?Discontinued Medications  ? No medications on file  ? ? ?Physical Exam: ? ?There were no vitals filed for this visit. ?There is no height  or weight on file to calculate BMI. ?Wt Readings from Last 3 Encounters:  ?06/10/21 240 lb (108.9 kg)  ?05/10/21 235 lb 12.8 oz (107 kg)  ?03/25/21 241 lb 12.8 oz (109.7 kg)  ? ? ?Labs reviewed: ?Basic Metabolic Panel: ?Recent Labs  ?  12/01/20 ?1417 02/25/21 ?0859 06/10/21 ?1128  ?NA 136 138 141  ?K 4.5 4.6 4.4  ?CL 101 103 105  ?CO2 '26 26 27  '$ ?GLUCOSE 368* 145* 115*  ?BUN 28* 23 17  ?  CREATININE 0.83 0.71 0.80  ?CALCIUM 9.9 9.7 9.5  ?TSH 0.03* 0.07* 0.10*  ? ?Liver Function Tests: ?Recent Labs  ?  12/01/20 ?1417 02/25/21 ?0859 06/10/21 ?1128  ?AST '17 18 24  '$ ?ALT '15 13 18  '$ ?BILITOT 0.3 0.5 0.3  ?PROT 7.3 6.8 6.9  ? ?No results for input(s): LIPASE, AMYLASE in the last 8760 hours. ?No results for input(s): AMMONIA in the last 8760 hours. ?CBC: ?Recent Labs  ?  12/01/20 ?1417  ?WBC 7.7  ?NEUTROABS 4,897  ?HGB 12.8  ?HCT 39.0  ?MCV 90.1  ?PLT 282  ? ?Lipid Panel: ?Recent Labs  ?  12/01/20 ?1417 02/25/21 ?0859  ?CHOL 179 154  ?HDL 53 53  ?LDLCALC 102* 80  ?TRIG 147 110  ?CHOLHDL 3.4 2.9  ? ?TSH: ?Recent Labs  ?  12/01/20 ?1417 02/25/21 ?0859 06/10/21 ?1128  ?TSH 0.03* 0.07* 0.10*  ? ?A1C: ?Lab Results  ?Component Value Date  ? HGBA1C 7.0 (H) 06/10/2021  ? ? ? ?Assessment/Plan ?1. Hypothyroidism, unspecified type ?Will decrease thyroid from 30 mg to 15 mg to take in addition to her 120 mg, will recheck TSH prior to next visit. ?- thyroid (NP THYROID) 15 MG tablet; Take 1 tablet (15 mg total) by mouth daily before breakfast.  Dispense: 90 tablet; Refill: 0 ? ?2. Type 2 diabetes mellitus without complication, without long-term current use of insulin ([place]) ?Her A1c is close to goal and discussed making dietary modifications and increasing physical activity to get her to goal and for weight loss. She is willing to do this. ?Discussing bringing in food log and blood sugar readings to evaluate in office if needed. Also offered nutritionist referral. She is going to cut back on sugary drinks and increase physical activity while  improving diet overall and we will reassess in 3 months.  ?-if needed will place RD referral at this time.  ?- Hemoglobin A1c; Future ?- Lipid panel; Future ?- BASIC METABOLIC PANEL WITH GFR; Future ? ?3

## 2021-06-21 ENCOUNTER — Encounter: Payer: Self-pay | Admitting: *Deleted

## 2021-08-29 ENCOUNTER — Ambulatory Visit: Payer: Medicare HMO | Admitting: Orthopedic Surgery

## 2021-08-29 ENCOUNTER — Ambulatory Visit (INDEPENDENT_AMBULATORY_CARE_PROVIDER_SITE_OTHER): Payer: Medicare HMO

## 2021-08-29 DIAGNOSIS — G8929 Other chronic pain: Secondary | ICD-10-CM

## 2021-08-29 DIAGNOSIS — M79671 Pain in right foot: Secondary | ICD-10-CM

## 2021-08-29 DIAGNOSIS — M6701 Short Achilles tendon (acquired), right ankle: Secondary | ICD-10-CM

## 2021-08-30 ENCOUNTER — Encounter: Payer: Self-pay | Admitting: Orthopedic Surgery

## 2021-08-30 NOTE — Progress Notes (Signed)
Office Visit Note   Patient: [name]           Date of Birth: 01/14/1956           MRN: [id number] Visit Date: 08/29/2021              Requested by: Lauree Chandler, NP [place],  [place] [address] PCP: Lauree Chandler, NP  Chief Complaint  Patient presents with   Right Foot - Pain      HPI: Patient is a 66 year old woman who presents with chronic right heel pain.  Patient states the pain is worse with activities worse with start up better with rest.  Assessment & Plan: Visit Diagnoses:  1. Chronic heel pain, right   2. Achilles tendon contracture, right     Plan: Patient was given instructions and demonstrated Achilles stretching.  Reevaluate in 4 weeks.  Discussed the possibility of a gastrocnemius recession.  Follow-Up Instructions: Return in about 4 weeks (around 09/26/2021).   Ortho Exam  Patient is alert, oriented, no adenopathy, well-dressed, normal affect, normal respiratory effort. Examination patient has an abductor lurch bilaterally.  She has pain to palpation of the origin of the plantar fascia.  The tarsal tunnel is not tender to palpation.  Lateral compression of the calcaneus is nontender.  With her knee extended patient has dorsiflexion just short of neutral.  She has a good dorsalis pedis pulse.  Imaging: XR Os Calcis Right  Result Date: 08/30/2021 2 view radiographs of the right heel shows Haglund's deformity calcification of the insertion of the Achilles and a large plantar heel spur.  No images are attached to the encounter.  Labs: Lab Results  Component Value Date   HGBA1C 7.0 (H) 06/10/2021   HGBA1C 7.0 (H) 02/25/2021   HGBA1C 7.9 (H) 12/01/2020   ESRSEDRATE 13 09/26/2016   REPTSTATUS 02/07/2019 FINAL 02/06/2019   CULT  02/06/2019    NO GROWTH Performed at [facility], [place] [address] [place], [place] [address]      Lab Results  Component Value Date   ALBUMIN 4.1 08/18/2019   ALBUMIN 4.6 10/17/2018    ALBUMIN 4.4 04/22/2018    No results found for: MG No results found for: VD25OH  No results found for: PREALBUMIN    Latest Ref Rng & Units 12/01/2020    2:17 PM 12/15/2019    8:21 AM 08/18/2019   10:13 AM  CBC EXTENDED  WBC 3.8 - 10.8 Thousand/uL 7.7   6.1   6.1    RBC 3.80 - 5.10 Million/uL 4.33   4.27   4.29    Hemoglobin 11.7 - 15.5 g/dL 12.8   12.9   12.8    HCT 35.0 - 45.0 % 39.0   38.7   39.1    Platelets 140 - 400 Thousand/uL 282   284   254    NEUT# 1,500 - 7,800 cells/uL 4,897   2,922     Lymph# 850 - 3,900 cells/uL 1,917   2,086        There is no height or weight on file to calculate BMI.  Orders:  Orders Placed This Encounter  Procedures   XR Os Calcis Right   No orders of the defined types were placed in this encounter.    Procedures: No procedures performed  Clinical Data: No additional findings.  ROS:  All other systems negative, except as noted in the HPI. Review of Systems  Objective: Vital Signs: There  were no vitals taken for this visit.  Specialty Comments:  No specialty comments available.  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, midline 10/25/2020   Urinary incontinence, mixed 10/25/2020   Weight loss advised 10/25/2020   Screening mammogram for breast cancer 10/25/2020   Upper airway cough syndrome 07/30/2018   Morbid (severe) obesity due to excess calories ([place]) 07/30/2018   Urinary, incontinence, stress female 05/15/2018   Encounter for screening breast examination 05/15/2018   Depression, major, single episode, moderate ([place]) 10/17/2017   Hypothyroidism 09/19/2017   Special screening for malignant neoplasms, colon    [place] 12/30/2014   Counseling for estrogen replacement therapy 12/30/2014   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   Mixed stress and urge urinary incontinence 02/10/2014   Diabetes ([place]) 01/18/2014   Posterior tibialis muscle dysfunction 10/24/2013    Hyperlipidemia LDL goal <100 03/10/2013   Superficial fungus infection of skin 07/05/2012   Hot flashes 07/05/2012   Rectocele 07/05/2012   Essential hypertension 07/04/2012   Hx of cervical cancer 07/04/2012   Stiffness of joint, lower leg, left 08/28/2011   Difficulty in walking(719.7) 08/28/2011   Pain in left knee 08/28/2011   Past Medical History:  Diagnosis Date   Arthritis    "fingers occasionally" (08/26/2014)   Asthmatic bronchitis    "haven't used an inhaler in years" (08/26/2014)   Cervical cancer ([place])    Counseling for estrogen replacement therapy 12/30/2014   Depression    Diabetes mellitus without complication (HCC)    type- 2   Elevated cholesterol    Exertional shortness of breath    Family history of adverse reaction to anesthesia    "I think my mama gets sick"   GERD (gastroesophageal reflux disease)    Hot flashes 07/05/2012   Hypertension    Hypothyroidism    IFG (impaired fasting glucose)    Mixed stress and urge urinary incontinence    [place] 12/30/2014   Obesity    Panniculus    PONV (postoperative nausea and vomiting)    Rectocele 07/05/2012   Stress incontinence    Superficial fungus infection of skin     Family History  Problem Relation Age of Onset   Heart disease Mother    Stroke Mother    Heart disease Father    Heart attack Father        MI at age 67   Diabetes Brother    Hypertension Brother    Diabetes Brother    Hypertension Brother    Other Brother        Gastrointestional disease   Cancer Maternal Aunt        cervical   Diabetes Maternal Grandmother    Diabetes Paternal Grandmother     Past Surgical History:  Procedure Laterality Date   ABDOMINAL HYSTERECTOMY  1986   "partial" Uterine cancer   ANKLE FUSION Left 10/24/2013   Procedure: LEFT ANKLE SUBTALAR AND TALONAVICULAR FUSION;  Surgeon: Newt Minion, MD;  Location: [facility];  Service: Orthopedics;  Laterality: Left;   ANKLE FUSION Left 08/26/2014   Procedure: Left Foot Take Down  Non-union with Revision Talonavicular and Subtalar Fusion;  Surgeon: Newt Minion, MD;  Location: [facility];  Service: Orthopedics;  Laterality: Left;   BACK SURGERY     BILATERAL SALPINGECTOMY  2010   w/LOA   COLONOSCOPY     COLONOSCOPY N/A 07/14/2016   Procedure: COLONOSCOPY;  Surgeon: Danie Binder, MD;  [facility];  Service: Endoscopy;  Laterality: N/A;  8:30   FUSION OF TALONAVICULAR JOINT Left    Take Down Non-union with Revision Talonavicular and Subtalar Fusion /notes 08/26/2014   HAMMER TOE SURGERY Bilateral 2000-2013   right-left   JOINT REPLACEMENT     Right knee   KNEE ARTHROSCOPY Bilateral 2008-2010    left-right   LUMBAR DISC SURGERY  2004   Lumbar 4- 5   POLYPECTOMY  07/14/2016   Procedure: POLYPECTOMY;  Surgeon: Danie Binder, MD;  [facility];  Service: Endoscopy;;  hepatic flexure   SHOULDER ARTHROSCOPY WITH SUBACROMIAL DECOMPRESSION, ROTATOR CUFF REPAIR AND BICEP TENDON REPAIR Right 02/11/2019   Procedure: right shoulde arthroscopy, biceps tenodesis lower trapezius tendon transfer;  Surgeon: Meredith Pel, MD;  Location: [facility];  Service: Orthopedics;  Laterality: Right;   TOTAL KNEE ARTHROPLASTY Right 2013   TUBAL LIGATION     Social History   Occupational History   Not on file  Tobacco Use   Smoking status: Former    Packs/day: 0.50    Years: 10.00    Pack years: 5.00    Types: Cigarettes    Quit date: 03/27/1988    Years since quitting: 33.4   Smokeless tobacco: Never  Vaping Use   Vaping Use: Never used  Substance and Sexual Activity   Alcohol use: Yes    Comment: occasionally a beer   Drug use: No   Sexual activity: Not Currently    Birth control/protection: Surgical    Comment: hyst

## 2021-09-05 ENCOUNTER — Telehealth: Payer: Self-pay | Admitting: *Deleted

## 2021-09-05 DIAGNOSIS — E1169 Type 2 diabetes mellitus with other specified complication: Secondary | ICD-10-CM

## 2021-09-05 MED ORDER — EMPAGLIFLOZIN 10 MG PO TABS: 10.0000 mg | ORAL_TABLET | Freq: Every day | ORAL | 2 refills | Status: DC

## 2021-09-05 NOTE — Telephone Encounter (Signed)
LMOM for patient with Jessica's response.

## 2021-09-05 NOTE — Telephone Encounter (Signed)
Rx for jardiance sent to pharmacy and to stop trulicity

## 2021-09-05 NOTE — Telephone Encounter (Signed)
Patient called and stated that she CANNOT afford the Trulicity. Stated that she is in the doughnut hole and it cost over $300/month.  Stated that she has Medicaid so she cannot use Patient assistance.  Requesting a cheaper alternative.  Please Advise.

## 2021-09-08 ENCOUNTER — Other Ambulatory Visit: Payer: Self-pay | Admitting: Nurse Practitioner

## 2021-09-08 DIAGNOSIS — E1169 Type 2 diabetes mellitus with other specified complication: Secondary | ICD-10-CM

## 2021-09-08 MED ORDER — TIRZEPATIDE 2.5 MG/0.5ML ~~LOC~~ SOAJ
2.5000 mg | SUBCUTANEOUS | 0 refills | Status: DC
Start: 2021-09-08 — End: 2021-09-19

## 2021-09-08 NOTE — Telephone Encounter (Signed)
Rx sent for mounjaro, she is to start at 2.5 mg injection weekly for 4 weeks then can increase to 5 mg injection weekly

## 2021-09-08 NOTE — Addendum Note (Signed)
Addended by: Lauree Chandler on: 09/08/2021 12:19 PM   Modules accepted: Orders

## 2021-09-08 NOTE — Addendum Note (Signed)
Addended by: Rafael Bihari A on: 09/08/2021 12:45 PM   Modules accepted: Orders

## 2021-09-08 NOTE — Telephone Encounter (Addendum)
Patient stated that the Vania Rea is just as much as $300. Stated that she is not going to pay it.  Patient is requesting a Rx for  Mounjaro instead. Stated that she can get a coupon to only pay $25 or she will just watch what she eats.   Please Advise.

## 2021-09-08 NOTE — Telephone Encounter (Signed)
Message came over from pharmacy stating that alternative request is for provider to provide alternative diagnosis code for insurance. Medication pend and sent to PCP Lauree Chandler, NP .

## 2021-09-08 NOTE — Telephone Encounter (Signed)
Patient notified and agreed.  

## 2021-09-16 ENCOUNTER — Other Ambulatory Visit: Payer: Medicare HMO

## 2021-09-16 DIAGNOSIS — E785 Hyperlipidemia, unspecified: Secondary | ICD-10-CM

## 2021-09-16 DIAGNOSIS — E039 Hypothyroidism, unspecified: Secondary | ICD-10-CM | POA: Diagnosis not present

## 2021-09-16 DIAGNOSIS — E1169 Type 2 diabetes mellitus with other specified complication: Secondary | ICD-10-CM | POA: Diagnosis not present

## 2021-09-16 DIAGNOSIS — R946 Abnormal results of thyroid function studies: Secondary | ICD-10-CM | POA: Diagnosis not present

## 2021-09-19 ENCOUNTER — Ambulatory Visit (INDEPENDENT_AMBULATORY_CARE_PROVIDER_SITE_OTHER): Payer: Medicare HMO | Admitting: Nurse Practitioner

## 2021-09-19 ENCOUNTER — Encounter: Payer: Self-pay | Admitting: Nurse Practitioner

## 2021-09-19 VITALS — BP 130/80 | HR 74 | Temp 97.1°F | Ht 61.5 in | Wt 236.4 lb

## 2021-09-19 DIAGNOSIS — E039 Hypothyroidism, unspecified: Secondary | ICD-10-CM

## 2021-09-19 DIAGNOSIS — E1169 Type 2 diabetes mellitus with other specified complication: Secondary | ICD-10-CM | POA: Diagnosis not present

## 2021-09-19 DIAGNOSIS — E785 Hyperlipidemia, unspecified: Secondary | ICD-10-CM | POA: Diagnosis not present

## 2021-09-19 DIAGNOSIS — I10 Essential (primary) hypertension: Secondary | ICD-10-CM

## 2021-09-19 MED ORDER — THYROID 120 MG PO TABS: 120.0000 mg | ORAL_TABLET | Freq: Every day | ORAL | 5 refills | Status: DC

## 2021-09-20 LAB — BASIC METABOLIC PANEL WITH GFR
BUN/Creatinine Ratio: 29 (calc) — ABNORMAL HIGH (ref 6–22)
BUN: 26 mg/dL — ABNORMAL HIGH (ref 7–25)
CO2: 24 mmol/L (ref 20–32)
Calcium: 9.6 mg/dL (ref 8.6–10.4)
Chloride: 105 mmol/L (ref 98–110)
Creat: 0.89 mg/dL (ref 0.50–1.05)
Glucose, Bld: 155 mg/dL — ABNORMAL HIGH (ref 65–99)
Potassium: 4.7 mmol/L (ref 3.5–5.3)
Sodium: 138 mmol/L (ref 135–146)
eGFR: 72 mL/min/{1.73_m2} (ref >=60)

## 2021-09-20 LAB — T4, FREE: Free T4: 0.9 ng/dL (ref 0.8–1.8)

## 2021-09-20 LAB — LIPID PANEL
Cholesterol: 141 mg/dL (ref <200)
HDL: 50 mg/dL (ref >=50)
LDL Cholesterol (Calc): 70 mg/dL (calc)
Non-HDL Cholesterol (Calc): 91 mg/dL (calc) (ref <130)
Total CHOL/HDL Ratio: 2.8 (calc) (ref <5.0)
Triglycerides: 129 mg/dL (ref <150)

## 2021-09-20 LAB — TSH: TSH: 0.1 mIU/L — ABNORMAL LOW (ref 0.40–4.50)

## 2021-09-20 LAB — HEMOGLOBIN A1C
Hgb A1c MFr Bld: 6.6 % of total Hgb — ABNORMAL HIGH (ref <5.7)
Mean Plasma Glucose: 143 mg/dL
eAG (mmol/L): 7.9 mmol/L

## 2021-09-20 LAB — T3, FREE: T3, Free: 3.2 pg/mL (ref 2.3–4.2)

## 2021-09-20 LAB — TEST AUTHORIZATION

## 2021-09-29 ENCOUNTER — Other Ambulatory Visit: Payer: Self-pay | Admitting: Nurse Practitioner

## 2021-09-29 ENCOUNTER — Telehealth: Payer: Self-pay

## 2021-09-29 DIAGNOSIS — E119 Type 2 diabetes mellitus without complications: Secondary | ICD-10-CM

## 2021-09-29 NOTE — Telephone Encounter (Signed)
Says metformin was denied.  Confirmed "Request refused: Patient has requested refill too soon"  Patient states she has a few week left on her med. Advised her to request when she has about 1 week remaining. She agreed. No further questions.

## 2021-09-30 ENCOUNTER — Ambulatory Visit: Payer: Medicare HMO | Admitting: Surgical

## 2021-09-30 ENCOUNTER — Encounter: Payer: Self-pay | Admitting: Orthopedic Surgery

## 2021-09-30 ENCOUNTER — Ambulatory Visit (INDEPENDENT_AMBULATORY_CARE_PROVIDER_SITE_OTHER): Payer: Medicare HMO

## 2021-09-30 DIAGNOSIS — M25551 Pain in right hip: Secondary | ICD-10-CM

## 2021-09-30 NOTE — Progress Notes (Signed)
Office Visit Note   Patient: [name]           Date of Birth: 1955/08/27           MRN: [id number] Visit Date: 09/30/2021 Requested by: Lauree Chandler, NP [place],  [place] [address] PCP: Lauree Chandler, NP  Subjective: Chief Complaint  Patient presents with   Right Hip - Pain    HPI: [name] is a 66 y.o. female who presents to the office complaining of right hip pain.  Patient states that she had a fall 2 years ago while recovering from her right shoulder surgery.  She notes she has had some worsening of her right hip pain since that fall slowly over the last 2 years.  She describes buttocks pain with a clicking sensation in the posterior lateral right hip.  No history of prior hip surgery.  Denies any radicular pain from her buttock down the leg.  She does note occasional groin pain.  Pain wakes her up at night on occasion.  She cannot really lay on the right side.  She normally lays on her stomach when she sleeps.  She does have a history of low back pain with prior lumbar spine discectomy by Dr. Lise Auer in 2010.  She is done well from this but she has noticed increasing low back pain in the last several months in particular.  No numbness or tingling.  No red flag symptoms.  Takes Tylenol and ibuprofen for her pain.  Does have history of diabetes with last A1c 6.6.[place] Kitchen                ROS: All systems reviewed are negative as they relate to the chief complaint within the history of present illness.  Patient denies fevers or chills.  Assessment & Plan: Visit Diagnoses:  1. Pain in right hip     Plan: Patient is a 66 year old female who presents for evaluation of right hip pain.  Began about 2 years ago after a fall.  Has been slowly progressing since then.  She is also had worsening low back pain.  No radicular pain past the buttock.  No weakness on exam today.  Most of her pain seems centered around the trochanter today but could be referred pain to the  buttocks from the low back as well, especially with her history of prior back surgery and with radiographs today demonstrating significant degenerative changes throughout the lumbar spine.  After discussion of options, plan to try physical therapy and refill 1 time per week for 4 weeks for right hip exercises and lumbar spine exercises.  Follow-up in 6 weeks for clinical recheck with consideration of MRI scan of the hip versus MRI lumbar spine versus trochanteric injection.  Patient agreed with plan.  Follow-up in 6 weeks.  Follow-Up Instructions: No follow-ups on file.   Orders:  Orders Placed This Encounter  Procedures   XR HIP UNILAT W OR W/O PELVIS 2-3 VIEWS RIGHT   XR Lumbar Spine 2-3 Views   Ambulatory referral to Physical Therapy   No orders of the defined types were placed in this encounter.     Procedures: No procedures performed   Clinical Data: No additional findings.  Objective: Vital Signs: There were no vitals taken for this visit.  Physical Exam:  Constitutional: Patient appears well-developed HEENT:  Head: Normocephalic Eyes:EOM are normal Neck: Normal range of motion Cardiovascular: Normal rate Pulmonary/chest: Effort normal Neurologic: Patient is alert Skin:  Skin is warm Psychiatric: Patient has normal mood and affect  Ortho Exam: Ortho exam demonstrates right hip with negative FADIR test.  Weakly positive Stinchfield sign.  Tenderness moderately over the greater trochanter on the right, mild on the left.  Ambulates with slight Trendelenburg gait but nothing pronounced.  She is able to actively AB duct her leg while in lateral decubitus position with reproduction of her pain.  Able to internally rotate her right hip actively.  She has tenderness throughout the axial lumbar spine.  5/5 motor strength of bilateral hip flexion, quadricep, hamstring, dorsiflexion, plantarflexion.  Negative straight leg raise bilaterally.  Specialty Comments:  No specialty  comments available.  Imaging: No results found.   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, midline 10/25/2020   Upper airway cough syndrome 07/30/2018   Morbid (severe) obesity due to excess calories ([place]) 07/30/2018   Depression, major, single episode, moderate ([place]) 10/17/2017   Hypothyroidism 09/19/2017   Mixed stress and urge urinary incontinence 02/10/2014   Diabetes ([place]) 01/18/2014   Hyperlipidemia LDL goal <100 03/10/2013   Hot flashes 07/05/2012   Rectocele 07/05/2012   Essential hypertension 07/04/2012   Stiffness of joint, lower leg, left 08/28/2011   Pain in left knee 08/28/2011   Past Medical History:  Diagnosis Date   Arthritis    "fingers occasionally" (08/26/2014)   Asthmatic bronchitis    "haven't used an inhaler in years" (08/26/2014)   Cervical cancer ([place])    Counseling for estrogen replacement therapy 12/30/2014   Depression    Diabetes mellitus without complication (HCC)    type- 2   Elevated cholesterol    Exertional shortness of breath    Family history of adverse reaction to anesthesia    "I think my mama gets sick"   GERD (gastroesophageal reflux disease)    Hot flashes 07/05/2012   Hypertension    Hypothyroidism    IFG (impaired fasting glucose)    Mixed stress and urge urinary incontinence    [place]y 12/30/2014   Obesity    Panniculus    PONV (postoperative nausea and vomiting)    Rectocele 07/05/2012   Stress incontinence    Superficial fungus infection of skin     Family History  Problem Relation Age of Onset   Heart disease Mother    Stroke Mother    Heart disease Father    Heart attack Father        MI at age 19   Diabetes Brother    Hypertension Brother    Diabetes Brother    Hypertension Brother    Other Brother        Gastrointestional disease   Cancer Maternal Aunt        cervical   Diabetes Maternal Grandmother    Diabetes Paternal Grandmother     Past  Surgical History:  Procedure Laterality Date   ABDOMINAL HYSTERECTOMY  1986   "partial" Uterine cancer   ANKLE FUSION Left 10/24/2013   Procedure: LEFT ANKLE SUBTALAR AND TALONAVICULAR FUSION;  Surgeon: Newt Minion, MD;  Location: [facility];  Service: Orthopedics;  Laterality: Left;   ANKLE FUSION Left 08/26/2014   Procedure: Left Foot Take Down Non-union with Revision Talonavicular and Subtalar Fusion;  Surgeon: Newt Minion, MD;  Location: [facility];  Service: Orthopedics;  Laterality: Left;   BACK SURGERY     BILATERAL SALPINGECTOMY  2010   w/LOA   COLONOSCOPY  COLONOSCOPY N/A 07/14/2016   Procedure: COLONOSCOPY;  Surgeon: Danie Binder, MD;  [facility];  Service: Endoscopy;  Laterality: N/A;  8:30   FUSION OF TALONAVICULAR JOINT Left    Take Down Non-union with Revision Talonavicular and Subtalar Fusion /notes 08/26/2014   HAMMER TOE SURGERY Bilateral 2000-2013   right-left   JOINT REPLACEMENT     Right knee   KNEE ARTHROSCOPY Bilateral 2008-2010    left-right   LUMBAR DISC SURGERY  2004   Lumbar 4- 5   POLYPECTOMY  07/14/2016   Procedure: POLYPECTOMY;  Surgeon: Danie Binder, MD;  [facility];  Service: Endoscopy;;  hepatic flexure   SHOULDER ARTHROSCOPY WITH SUBACROMIAL DECOMPRESSION, ROTATOR CUFF REPAIR AND BICEP TENDON REPAIR Right 02/11/2019   Procedure: right shoulde arthroscopy, biceps tenodesis lower trapezius tendon transfer;  Surgeon: Meredith Pel, MD;  Location: [facility];  Service: Orthopedics;  Laterality: Right;   TOTAL KNEE ARTHROPLASTY Right 2013   TUBAL LIGATION     Social History   Occupational History   Not on file  Tobacco Use   Smoking status: Former    Packs/day: 0.50    Years: 10.00    Total pack years: 5.00    Types: Cigarettes    Quit date: 03/27/1988    Years since quitting: 33.5   Smokeless tobacco: Never  Vaping Use   Vaping Use: Never used  Substance and Sexual Activity   Alcohol use: Yes    Comment: occasionally a  beer   Drug use: No   Sexual activity: Not Currently    Birth control/protection: Surgical    Comment: hyst

## 2021-10-17 ENCOUNTER — Other Ambulatory Visit: Payer: Self-pay | Admitting: Nurse Practitioner

## 2021-10-17 DIAGNOSIS — I10 Essential (primary) hypertension: Secondary | ICD-10-CM

## 2021-11-01 ENCOUNTER — Ambulatory Visit (INDEPENDENT_AMBULATORY_CARE_PROVIDER_SITE_OTHER): Payer: Medicare HMO | Admitting: Nurse Practitioner

## 2021-11-01 ENCOUNTER — Encounter: Payer: Self-pay | Admitting: Nurse Practitioner

## 2021-11-01 DIAGNOSIS — Z0000 Encounter for general adult medical examination without abnormal findings: Secondary | ICD-10-CM | POA: Diagnosis not present

## 2021-11-01 NOTE — Progress Notes (Signed)
   This service is provided via telemedicine  No vital signs collected/recorded due to the encounter was a telemedicine visit.   Location of patient (ex: home, work):  Home  Patient consents to a telephone visit: Yes, see telephone visit dated 06/14/2021  Location of the provider (ex: office, home):  [place], [place]   Name of any referring provider:  N/A  Names of all persons participating in the telemedicine service and their role in the encounter:  S.Chrae B/CMA, Sherrie Mustache, NP, and Patient   Time spent on call:  11 min with medical assistant

## 2021-11-01 NOTE — Progress Notes (Signed)
Subjective:   [name] is a 66 y.o. female who presents for Medicare Annual (Subsequent) preventive examination.  Review of Systems     Cardiac Risk Factors include: diabetes mellitus;hypertension;dyslipidemia;advanced age (>31mn, >>26women);obesity (BMI >30kg/m2)     Objective:    There were no vitals filed for this visit. There is no height or weight on file to calculate BMI.     11/01/2021    8:27 AM 06/10/2021   10:35 AM 05/10/2021    9:18 AM 03/07/2021   11:38 AM 03/19/2019    2:37 PM 02/06/2019    8:54 AM 07/26/2018    4:12 PM  Advanced Directives  Does Patient Have a Medical Advance Directive? No No No No No No No  Would patient like information on creating a medical advance directive? No - Patient declined No - Patient declined No - Patient declined Yes (MAU/Ambulatory/Procedural Areas - Information given) No - Patient declined Yes (MAU/Ambulatory/Procedural Areas - Information given)     Current Medications (verified) Outpatient Encounter Medications as of 11/01/2021  Medication Sig   acetaminophen (TYLENOL) 500 MG tablet Take 500 mg by mouth as needed.   empagliflozin (JARDIANCE) 10 MG TABS tablet Take 10 mg by mouth daily.   glucose blood (ONETOUCH ULTRA) test strip Use to check blood sugar twice daily. Dx: E11.9   metFORMIN (GLUCOPHAGE) 500 MG tablet Take 2 tablets (1,000 mg total) by mouth 2 (two) times daily.   nystatin (MYCOSTATIN/NYSTOP) powder Apply 1 application topically 2 (two) times daily.   OneTouch Delica Lancets 397QMISC Use to check blood sugar twice daily. Dx: E11.9   rosuvastatin (CRESTOR) 20 MG tablet Take 1 tablet (20 mg total) by mouth daily.   telmisartan-hydrochlorothiazide (MICARDIS HCT) 40-12.5 MG tablet TAKE 1 TABLET BY MOUTH EVERY DAY   thyroid (ARMOUR) 60 MG tablet Take 60 mg by mouth daily before breakfast.   [DISCONTINUED] thyroid (NP THYROID) 120 MG tablet Take 1 tablet (120 mg total) by mouth daily before breakfast.   No  facility-administered encounter medications on file as of 11/01/2021.    Allergies (verified) Naprosyn [naproxen]   History: Past Medical History:  Diagnosis Date   Arthritis    "fingers occasionally" (08/26/2014)   Asthmatic bronchitis    "haven't used an inhaler in years" (08/26/2014)   Cervical cancer [place]    Counseling for estrogen replacement therapy 12/30/2014   Depression    Diabetes mellitus without complication (HCC)    type- 2   Elevated cholesterol    Exertional shortness of breath    Family history of adverse reaction to anesthesia    "I think my mama gets sick"   GERD (gastroesophageal reflux disease)    Hot flashes 07/05/2012   Hypertension    Hypothyroidism    IFG (impaired fasting glucose)    Mixed stress and urge urinary incontinence    [place] 12/30/2014   Obesity    Panniculus    PONV (postoperative nausea and vomiting)    Rectocele 07/05/2012   Stress incontinence    Superficial fungus infection of skin    Past Surgical History:  Procedure Laterality Date   ABDOMINAL HYSTERECTOMY  1986   "partial" Uterine cancer   ANKLE FUSION Left 10/24/2013   Procedure: LEFT ANKLE SUBTALAR AND TALONAVICULAR FUSION;  Surgeon: MNewt Minion MD;  [facility]  Service: Orthopedics;  Laterality: Left;   ANKLE FUSION Left 08/26/2014   Procedure: Left Foot Take Down Non-union with Revision Talonavicular and Subtalar Fusion;  Surgeon:  Newt Minion, MD;  Location: [place];  Service: Orthopedics;  Laterality: Left;   BACK SURGERY     BILATERAL SALPINGECTOMY  2010   w/LOA   COLONOSCOPY     COLONOSCOPY N/A 07/14/2016   Procedure: COLONOSCOPY;  Surgeon: Danie Binder, MD;  [facility];  Service: Endoscopy;  Laterality: N/A;  8:30   FUSION OF TALONAVICULAR JOINT Left    Take Down Non-union with Revision Talonavicular and Subtalar Fusion /notes 08/26/2014   HAMMER TOE SURGERY Bilateral 2000-2013   right-left   JOINT REPLACEMENT     Right knee   KNEE ARTHROSCOPY  Bilateral 2008-2010    left-right   LUMBAR DISC SURGERY  2004   Lumbar 4- 5   POLYPECTOMY  07/14/2016   Procedure: POLYPECTOMY;  Surgeon: Danie Binder, MD;  [facility];  Service: Endoscopy;;  hepatic flexure   SHOULDER ARTHROSCOPY WITH SUBACROMIAL DECOMPRESSION, ROTATOR CUFF REPAIR AND BICEP TENDON REPAIR Right 02/11/2019   Procedure: right shoulde arthroscopy, biceps tenodesis lower trapezius tendon transfer;  Surgeon: Meredith Pel, MD;  Location: [facility];  Service: Orthopedics;  Laterality: Right;   TOTAL KNEE ARTHROPLASTY Right 2013   TUBAL LIGATION     Family History  Problem Relation Age of Onset   Heart disease Mother    Stroke Mother    Heart disease Father    Heart attack Father        MI at age 58   Diabetes Brother    Hypertension Brother    Diabetes Brother    Hypertension Brother    Other Brother        Gastrointestional disease   Cancer Maternal Aunt        cervical   Diabetes Maternal Grandmother    Diabetes Paternal Grandmother    Social History   Socioeconomic History   Marital status: Married    Spouse name: Not on file   Number of children: Not on file   Years of education: Not on file   Highest education level: Not on file  Occupational History   Not on file  Tobacco Use   Smoking status: Former    Packs/day: 0.50    Years: 10.00    Total pack years: 5.00    Types: Cigarettes    Quit date: 03/27/1988    Years since quitting: 33.6   Smokeless tobacco: Never  Vaping Use   Vaping Use: Never used  Substance and Sexual Activity   Alcohol use: Yes    Comment: occasionally a beer   Drug use: No   Sexual activity: Not Currently    Birth control/protection: Surgical    Comment: hyst  Other Topics Concern   Not on file  Social History Narrative   Married for 45 years.Lives with husband.On disability for foot problems.Previously worked for [employer].      Per [facility] New Patient Packet Abstracted on 11/30/2020:      Diet: Left blank       Caffeine: Sometimes      Married, if yes what year: Yes, 1976      Do you live in a house, apartment, assisted living, condo, trailer, ect: House      Is it one or more stories: Left Blank      How many persons live in your home? Left Blank      Pets: Yes, 2      Highest level or education completed: Left Blank      Current/Past profession:Left  Blank      Exercise:                  Type and how often:          Living Will: No   DNR: No   POA/HPOA: No      Functional Status:   Do you have difficulty bathing or dressing yourself?No   Do you have difficulty preparing food or eating?No   Do you have difficulty managing your medications?No   Do you have difficulty managing your finances?No   Do you have difficulty affording your medications? No   Social Determinants of Radio broadcast assistant Strain: Not on file  Food Insecurity: Not on file  Transportation Needs: Not on file  Physical Activity: Not on file  Stress: Not on file  Social Connections: Not on file    Tobacco Counseling Counseling given: Not Answered   Clinical Intake:  Pre-visit preparation completed: Yes        BMI - recorded: 46 Nutritional Status: BMI > 30  Obese Diabetes: Yes  How often do you need to have someone help you when you read instructions, pamphlets, or other written materials from your doctor or pharmacy?: 1 - Never  Diabetic?yes         Activities of Daily Living    11/01/2021    9:19 AM  In your present state of health, do you have any difficulty performing the following activities:  Hearing? 0  Vision? 0  Difficulty concentrating or making decisions? 0  Walking or climbing stairs? 0  Dressing or bathing? 0  Doing errands, shopping? 0  Preparing Food and eating ? N  Using the Toilet? N  In the past six months, have you accidently leaked urine? Y  Do you have problems with loss of bowel control? N  Managing your Medications? N  Managing your Finances? N   Housekeeping or managing your Housekeeping? N    Patient Care Team: Lauree Chandler, NP as PCP - General (Geriatric Medicine) Harl Bowie Alphonse Guild, MD as PCP - Cardiology (Cardiology)  Indicate any recent Medical Services you may have received from other than [facility] providers in the past year (date may be approximate).     Assessment:   This is a routine wellness examination for [place].  Hearing/Vision screen Hearing Screening - Comments:: No hearing issues other ringing in ears (ongoing concern)  Vision Screening - Comments:: Last eye exam less than 12 months ago. Dr. Jorja Loa ([facility])  Dietary issues and exercise activities discussed:     Goals Addressed   None    Depression Screen    11/01/2021    8:26 AM 03/07/2021   11:37 AM 12/01/2020    1:34 PM 08/04/2020    9:42 AM 11/28/2018    4:08 PM 05/15/2018    1:49 PM 09/19/2017    8:47 AM  PHQ 2/9 Scores  PHQ - 2 Score 0 0 0 0 0 0 4  PHQ- 9 Score    0   15    Fall Risk    11/01/2021    8:26 AM 09/19/2021   10:29 AM 05/10/2021    9:18 AM 03/07/2021   11:37 AM 12/01/2020    1:34 PM  Fall Risk   Falls in the past year? 0 0 0 0 0  Number falls in past yr: 0 0 0 0 0  Injury with Fall? 0 0 0 0 0  Risk for fall due to : No Fall  Risks No Fall Risks No Fall Risks No Fall Risks No Fall Risks  Follow up Falls evaluation completed Falls evaluation completed Falls evaluation completed Falls evaluation completed Falls evaluation completed    [place]:  Any stairs in or around the home? Yes  If so, are there any without handrails? Yes  Home free of loose throw rugs in walkways, pet beds, electrical cords, etc? No  Adequate lighting in your home to reduce risk of falls? Yes   ASSISTIVE DEVICES UTILIZED TO PREVENT FALLS:  Life alert? No  Use of a cane, walker or w/c? No  Grab bars in the bathroom? No  Shower chair or bench in shower? No  Elevated toilet seat or a handicapped toilet? No   TIMED UP  AND GO:  Was the test performed? No .    Cognitive Function:        11/01/2021    8:58 AM  6CIT Screen  What Year? 0 points  What month? 0 points  What time? 0 points  Count back from 20 0 points  Months in reverse 0 points  Repeat phrase 0 points  Total Score 0 points    Immunizations Immunization History  Administered Date(s) Administered   Influenza Inj Mdck Quad Pf 01/06/2018   Influenza,inj,Quad PF,6+ Mos 01/12/2015, 12/03/2018, 12/25/2020   Influenza-Unspecified 01/08/2014, 01/16/2016, 01/06/2018   Moderna Sars-Covid-2 Vaccination 01/08/2020, 02/05/2020   PNEUMOCOCCAL CONJUGATE-20 06/10/2021   Pneumococcal Polysaccharide-23 01/13/2014   Tdap 09/04/2016    TDAP status: Up to date  Flu Vaccine status: Due, Education has been provided regarding the importance of this vaccine. Advised may receive this vaccine at local pharmacy or Health Dept. Aware to provide a copy of the vaccination record if obtained from local pharmacy or Health Dept. Verbalized acceptance and understanding.  Pneumococcal vaccine status: Up to date  Covid-19 vaccine status: Information provided on how to obtain vaccines.   Qualifies for Shingles Vaccine? Yes   Zostavax completed No   Shingrix Completed?: No.    Education has been provided regarding the importance of this vaccine. Patient has been advised to call insurance company to determine out of pocket expense if they have not yet received this vaccine. Advised may also receive vaccine at local pharmacy or Health Dept. Verbalized acceptance and understanding.  Screening Tests Health Maintenance  Topic Date Due   Zoster Vaccines- Shingrix (1 of 2) Never done   OPHTHALMOLOGY EXAM  12/09/2020   DEXA SCAN  Never done   INFLUENZA VACCINE  10/25/2021   COVID-19 Vaccine (3 - Moderna series) 06/11/2022 (Originally 04/01/2020)   MAMMOGRAM  11/19/2021   HEMOGLOBIN A1C  03/18/2022   FOOT EXAM  09/20/2022   COLONOSCOPY (Pts 45-67yr Insurance  coverage will need to be confirmed)  07/15/2026   TETANUS/TDAP  09/05/2026   Pneumonia Vaccine 65 Years old  Completed   Hepatitis C Screening  Completed   HIV Screening  Completed   HPV VACCINES  Aged Out   PAP SMEAR-Modifier  Discontinued    Health Maintenance  Health Maintenance Due  Topic Date Due   Zoster Vaccines- Shingrix (1 of 2) Never done   OPHTHALMOLOGY EXAM  12/09/2020   DEXA SCAN  Never done   INFLUENZA VACCINE  10/25/2021    Colorectal cancer screening: Type of screening: Colonoscopy. Completed 2028. Repeat every 10 years  Mammogram status: Completed 11/19/20. Repeat every year  Bone Density status: Ordered and scheduled. Pt provided with contact info and advised to call  to schedule appt.  Lung Cancer Screening: (Low Dose CT Chest recommended if Age 68-80 years, 30 pack-year currently smoking OR have quit w/in 15years.) does not qualify.   Lung Cancer Screening Referral: na  Additional Screening:  Hepatitis C Screening: does qualify; Completed  Vision Screening: Recommended annual ophthalmology exams for early detection of glaucoma and other disorders of the eye. Is the patient up to date with their annual eye exam?  Yes  Who is the provider or what is the name of the office in which the patient attends annual eye exams? My eye doctor If pt is not established with a provider, would they like to be referred to a provider to establish care? No .   Dental Screening: Recommended annual dental exams for proper oral hygiene  Community Resource Referral / Chronic Care Management: CRR required this visit?  No   CCM required this visit?  No      Plan:     I have personally reviewed and noted the following in the patient's chart:   Medical and social history Use of alcohol, tobacco or illicit drugs  Current medications and supplements including opioid prescriptions.  Functional ability and status Nutritional status Physical activity Advanced  directives List of other physicians Hospitalizations, surgeries, and ER visits in previous 12 months Vitals Screenings to include cognitive, depression, and falls Referrals and appointments  In addition, I have reviewed and discussed with patient certain preventive protocols, quality metrics, and best practice recommendations. A written personalized care plan for preventive services as well as general preventive health recommendations were provided to patient.     Lauree Chandler, NP   11/01/2021    Virtual Visit via Telephone Note  I connected with patient 11/01/21 at  9:00 AM EDT by telephone and verified that I am speaking with the correct person using two identifiers.  Location: Patient: home Provider: [facility]   I discussed the limitations, risks, security and privacy concerns of performing an evaluation and management service by telephone and the availability of in person appointments. I also discussed with the patient that there may be a patient responsible charge related to this service. The patient expressed understanding and agreed to proceed.   I discussed the assessment and treatment plan with the patient. The patient was provided an opportunity to ask questions and all were answered. The patient agreed with the plan and demonstrated an understanding of the instructions.   The patient was advised to call back or seek an in-person evaluation if the symptoms worsen or if the condition fails to improve as anticipated.  I provided 15 minutes of non-face-to-face time during this encounter.  Carlos American. Harle Battiest Avs printed and mailed

## 2021-11-01 NOTE — Patient Instructions (Signed)
[name] , Thank you for taking time to come for your Medicare Wellness Visit. I appreciate your ongoing commitment to your health goals. Please review the following plan we discussed and let me know if I can assist you in the future.   Screening recommendations/referrals: Colonoscopy up to date Mammogram scheduled Bone Density scheduled Recommended yearly ophthalmology/optometry visit for glaucoma screening and checkup Recommended yearly dental visit for hygiene and checkup  Vaccinations: Influenza vaccine- due annually in September/October Pneumococcal vaccine up tod ate Tdap vaccine up to date Shingles vaccine up to date  Advanced directives: recommended to complete.   Conditions/risks identified: diabetes, obesity, hyperlipidemia, hypertension  Next appointment: yearly    Preventive Care 65 Years and Older, Female Preventive care refers to lifestyle choices and visits with your health care provider that can promote health and wellness. What does preventive care include? A yearly physical exam. This is also called an annual well check. Dental exams once or twice a year. Routine eye exams. Ask your health care provider how often you should have your eyes checked. Personal lifestyle choices, including: Daily care of your teeth and gums. Regular physical activity. Eating a healthy diet. Avoiding tobacco and drug use. Limiting alcohol use. Practicing safe sex. Taking low-dose aspirin every day. Taking vitamin and mineral supplements as recommended by your health care provider. What happens during an annual well check? The services and screenings done by your health care provider during your annual well check will depend on your age, overall health, lifestyle risk factors, and family history of disease. Counseling  Your health care provider may ask you questions about your: Alcohol use. Tobacco use. Drug use. Emotional well-being. Home and relationship well-being. Sexual  activity. Eating habits. History of falls. Memory and ability to understand (cognition). Work and work Statistician. Reproductive health. Screening  You may have the following tests or measurements: Height, weight, and BMI. Blood pressure. Lipid and cholesterol levels. These may be checked every 5 years, or more frequently if you are over 68 years old. Skin check. Lung cancer screening. You may have this screening every year starting at age 40 if you have a 30-pack-year history of smoking and currently smoke or have quit within the past 15 years. Fecal occult blood test (FOBT) of the stool. You may have this test every year starting at age 50. Flexible sigmoidoscopy or colonoscopy. You may have a sigmoidoscopy every 5 years or a colonoscopy every 10 years starting at age 52. Hepatitis C blood test. Hepatitis B blood test. Sexually transmitted disease (STD) testing. Diabetes screening. This is done by checking your blood sugar (glucose) after you have not eaten for a while (fasting). You may have this done every 1-3 years. Bone density scan. This is done to screen for osteoporosis. You may have this done starting at age 52. Mammogram. This may be done every 1-2 years. Talk to your health care provider about how often you should have regular mammograms. Talk with your health care provider about your test results, treatment options, and if necessary, the need for more tests. Vaccines  Your health care provider may recommend certain vaccines, such as: Influenza vaccine. This is recommended every year. Tetanus, diphtheria, and acellular pertussis (Tdap, Td) vaccine. You may need a Td booster every 10 years. Zoster vaccine. You may need this after age 43. Pneumococcal 13-valent conjugate (PCV13) vaccine. One dose is recommended after age 18. Pneumococcal polysaccharide (PPSV23) vaccine. One dose is recommended after age 24. Talk to your health care provider about  which screenings and vaccines  you need and how often you need them. This information is not intended to replace advice given to you by your health care provider. Make sure you discuss any questions you have with your health care provider. Document Released: 04/09/2015 Document Revised: 12/01/2015 Document Reviewed: 01/12/2015 Elsevier Interactive Patient Education  2017 [employer] Prevention in the Home Falls can cause injuries. They can happen to people of all ages. There are many things you can do to make your home safe and to help prevent falls. What can I do on the outside of my home? Regularly fix the edges of walkways and driveways and fix any cracks. Remove anything that might make you trip as you walk through a door, such as a raised step or threshold. Trim any bushes or trees on the path to your home. Use bright outdoor lighting. Clear any walking paths of anything that might make someone trip, such as rocks or tools. Regularly check to see if handrails are loose or broken. Make sure that both sides of any steps have handrails. Any raised decks and porches should have guardrails on the edges. Have any leaves, snow, or ice cleared regularly. Use sand or salt on walking paths during winter. Clean up any spills in your garage right away. This includes oil or grease spills. What can I do in the bathroom? Use night lights. Install grab bars by the toilet and in the tub and shower. Do not use towel bars as grab bars. Use non-skid mats or decals in the tub or shower. If you need to sit down in the shower, use a plastic, non-slip stool. Keep the floor dry. Clean up any water that spills on the floor as soon as it happens. Remove soap buildup in the tub or shower regularly. Attach bath mats securely with double-sided non-slip rug tape. Do not have throw rugs and other things on the floor that can make you trip. What can I do in the bedroom? Use night lights. Make sure that you have a light by your bed that  is easy to reach. Do not use any sheets or blankets that are too big for your bed. They should not hang down onto the floor. Have a firm chair that has side arms. You can use this for support while you get dressed. Do not have throw rugs and other things on the floor that can make you trip. What can I do in the kitchen? Clean up any spills right away. Avoid walking on wet floors. Keep items that you use a lot in easy-to-reach places. If you need to reach something above you, use a strong step stool that has a grab bar. Keep electrical cords out of the way. Do not use floor polish or wax that makes floors slippery. If you must use wax, use non-skid floor wax. Do not have throw rugs and other things on the floor that can make you trip. What can I do with my stairs? Do not leave any items on the stairs. Make sure that there are handrails on both sides of the stairs and use them. Fix handrails that are broken or loose. Make sure that handrails are as long as the stairways. Check any carpeting to make sure that it is firmly attached to the stairs. Fix any carpet that is loose or worn. Avoid having throw rugs at the top or bottom of the stairs. If you do have throw rugs, attach them to the floor with  carpet tape. Make sure that you have a light switch at the top of the stairs and the bottom of the stairs. If you do not have them, ask someone to add them for you. What else can I do to help prevent falls? Wear shoes that: Do not have high heels. Have rubber bottoms. Are comfortable and fit you well. Are closed at the toe. Do not wear sandals. If you use a stepladder: Make sure that it is fully opened. Do not climb a closed stepladder. Make sure that both sides of the stepladder are locked into place. Ask someone to hold it for you, if possible. Clearly mark and make sure that you can see: Any grab bars or handrails. First and last steps. Where the edge of each step is. Use tools that help you  move around (mobility aids) if they are needed. These include: Canes. Walkers. Scooters. Crutches. Turn on the lights when you go into a dark area. Replace any light bulbs as soon as they burn out. Set up your furniture so you have a clear path. Avoid moving your furniture around. If any of your floors are uneven, fix them. If there are any pets around you, be aware of where they are. Review your medicines with your doctor. Some medicines can make you feel dizzy. This can increase your chance of falling. Ask your doctor what other things that you can do to help prevent falls. This information is not intended to replace advice given to you by your health care provider. Make sure you discuss any questions you have with your health care provider. Document Released: 01/07/2009 Document Revised: 08/19/2015 Document Reviewed: 04/17/2014 Elsevier Interactive Patient Education  2017 [employer].

## 2021-11-03 ENCOUNTER — Other Ambulatory Visit: Payer: Self-pay

## 2021-11-03 DIAGNOSIS — E039 Hypothyroidism, unspecified: Secondary | ICD-10-CM

## 2021-11-07 ENCOUNTER — Other Ambulatory Visit: Payer: Medicare HMO

## 2021-11-07 DIAGNOSIS — E039 Hypothyroidism, unspecified: Secondary | ICD-10-CM | POA: Diagnosis not present

## 2021-11-08 LAB — TSH: TSH: 4.35 mIU/L (ref 0.40–4.50)

## 2021-11-14 ENCOUNTER — Ambulatory Visit: Payer: Medicare HMO | Admitting: Orthopedic Surgery

## 2021-11-21 ENCOUNTER — Other Ambulatory Visit (HOSPITAL_COMMUNITY): Payer: Medicare HMO

## 2021-11-21 ENCOUNTER — Ambulatory Visit (HOSPITAL_COMMUNITY): Payer: Medicare HMO

## 2021-11-22 ENCOUNTER — Other Ambulatory Visit: Payer: Self-pay | Admitting: *Deleted

## 2021-11-22 DIAGNOSIS — E119 Type 2 diabetes mellitus without complications: Secondary | ICD-10-CM

## 2021-11-22 MED ORDER — METFORMIN HCL 500 MG PO TABS: 1000.0000 mg | ORAL_TABLET | Freq: Two times a day (BID) | ORAL | 1 refills | Status: DC

## 2021-11-22 NOTE — Telephone Encounter (Signed)
[facility] requested refill.

## 2021-12-05 ENCOUNTER — Other Ambulatory Visit: Payer: Self-pay | Admitting: Nurse Practitioner

## 2021-12-05 DIAGNOSIS — E785 Hyperlipidemia, unspecified: Secondary | ICD-10-CM

## 2021-12-13 ENCOUNTER — Other Ambulatory Visit: Payer: Medicare HMO

## 2021-12-13 DIAGNOSIS — E1169 Type 2 diabetes mellitus with other specified complication: Secondary | ICD-10-CM

## 2021-12-14 LAB — HEMOGLOBIN A1C
Hgb A1c MFr Bld: 7.1 % of total Hgb — ABNORMAL HIGH (ref <5.7)
Mean Plasma Glucose: 157 mg/dL
eAG (mmol/L): 8.7 mmol/L

## 2021-12-15 ENCOUNTER — Ambulatory Visit (HOSPITAL_COMMUNITY)
Admission: RE | Admit: 2021-12-15 | Discharge: 2021-12-15 | Disposition: A | Discharge status: Home / Self Care | Payer: Medicare HMO | Source: Ambulatory Visit | Attending: Nurse Practitioner | Admitting: Nurse Practitioner

## 2021-12-15 DIAGNOSIS — Z1231 Encounter for screening mammogram for malignant neoplasm of breast: Secondary | ICD-10-CM

## 2021-12-15 DIAGNOSIS — E2839 Other primary ovarian failure: Secondary | ICD-10-CM | POA: Insufficient documentation

## 2021-12-15 DIAGNOSIS — Z780 Asymptomatic menopausal state: Secondary | ICD-10-CM | POA: Diagnosis not present

## 2021-12-16 ENCOUNTER — Other Ambulatory Visit: Payer: Self-pay

## 2021-12-16 MED ORDER — CALCIUM CARBONATE 600 MG PO TABS: 600.0000 mg | ORAL_TABLET | Freq: Two times a day (BID) | ORAL | 11 refills | Status: AC

## 2021-12-16 MED ORDER — VITAMIN D3 50 MCG (2000 UT) PO CAPS: 2000.0000 [IU] | ORAL_CAPSULE | Freq: Every day | ORAL | 11 refills | Status: DC

## 2021-12-19 ENCOUNTER — Ambulatory Visit: Payer: Medicare HMO | Admitting: Nurse Practitioner

## 2021-12-22 ENCOUNTER — Other Ambulatory Visit: Payer: Self-pay

## 2021-12-22 MED ORDER — THYROID 60 MG PO TABS: 60.0000 mg | ORAL_TABLET | Freq: Every day | ORAL | 11 refills | Status: DC

## 2022-01-06 ENCOUNTER — Ambulatory Visit (INDEPENDENT_AMBULATORY_CARE_PROVIDER_SITE_OTHER): Payer: Medicare HMO | Admitting: Nurse Practitioner

## 2022-01-06 ENCOUNTER — Encounter: Payer: Self-pay | Admitting: Nurse Practitioner

## 2022-01-06 VITALS — BP 128/76 | HR 72 | Temp 97.9°F | Ht 61.5 in | Wt 236.0 lb

## 2022-01-06 DIAGNOSIS — E1169 Type 2 diabetes mellitus with other specified complication: Secondary | ICD-10-CM

## 2022-01-06 DIAGNOSIS — E039 Hypothyroidism, unspecified: Secondary | ICD-10-CM | POA: Diagnosis not present

## 2022-01-06 DIAGNOSIS — Z23 Encounter for immunization: Secondary | ICD-10-CM

## 2022-01-06 LAB — TSH: TSH: 3.78 mIU/L (ref 0.40–4.50)

## 2022-01-06 NOTE — Progress Notes (Signed)
Careteam: Patient Care Team: Lauree Chandler, NP as PCP - General (Geriatric Medicine) Harl Bowie Alphonse Guild, MD as PCP - Cardiology (Cardiology)  PLACE OF SERVICE:  [place] Directive information Does Patient Have a Medical Advance Directive?: No, Would patient like information on creating a medical advance directive?: No - Patient declined  Allergies  Allergen Reactions   Naprosyn [Naproxen] Nausea Only    Can take Alleve but years ago patient took a liquid form of this medication and it made me nauseated    Chief Complaint  Patient presents with   Medical Management of Chronic Issues    3 month follow-up. Discuss frequency of testing blood sugars. Discuss need for shingrix, Urine ACR and eye exam or post pone if patient refuses.      HPI: Patient is a 66 y.o. female for follow up on thyroid an diabetes.   She wanted to change diabetic shot- she is currently taking mounjaro- she is seeing Dr Maudie Mercury for her mourjaro prescription taking 2.5 mg weekly She is hoping she can lose weight and improve a1c  She feels like she is losing weight, has went down in the size of her clothing.  She is moving more at home and doing exercise at a senior center.    Review of Systems:  Review of Systems  Constitutional:  Negative for chills, fever and weight loss.  HENT:  Negative for tinnitus.   Respiratory:  Negative for cough, sputum production and shortness of breath.   Cardiovascular:  Negative for chest pain, palpitations and leg swelling.  Gastrointestinal:  Negative for abdominal pain, constipation, diarrhea and heartburn.  Genitourinary:  Negative for dysuria, frequency and urgency.  Musculoskeletal:  Negative for back pain, falls, joint pain and myalgias.  Skin: Negative.   Neurological:  Negative for dizziness and headaches.  Psychiatric/Behavioral:  Negative for depression and memory loss. The patient does not have insomnia.     Past Medical History:  Diagnosis  Date   Arthritis    "fingers occasionally" (08/26/2014)   Asthmatic bronchitis    "haven't used an inhaler in years" (08/26/2014)   Cervical cancer ([place])    Counseling for estrogen replacement therapy 12/30/2014   Depression    Diabetes mellitus without complication (HCC)    type- 2   Elevated cholesterol    Exertional shortness of breath    Family history of adverse reaction to anesthesia    "I think my mama gets sick"   GERD (gastroesophageal reflux disease)    Hot flashes 07/05/2012   Hypertension    Hypothyroidism    IFG (impaired fasting glucose)    Mixed stress and urge urinary incontinence    Moody 12/30/2014   Obesity    Panniculus    PONV (postoperative nausea and vomiting)    Rectocele 07/05/2012   Stress incontinence    Superficial fungus infection of skin    Past Surgical History:  Procedure Laterality Date   ABDOMINAL HYSTERECTOMY  1986   "partial" Uterine cancer   ANKLE FUSION Left 10/24/2013   Procedure: LEFT ANKLE SUBTALAR AND TALONAVICULAR FUSION;  Surgeon: Newt Minion, MD;  Location: [facility];  Service: Orthopedics;  Laterality: Left;   ANKLE FUSION Left 08/26/2014   Procedure: Left Foot Take Down Non-union with Revision Talonavicular and Subtalar Fusion;  Surgeon: Newt Minion, MD;  [facility];  Service: Orthopedics;  Laterality: Left;   BACK SURGERY     BILATERAL SALPINGECTOMY  2010   w/LOA  COLONOSCOPY     COLONOSCOPY N/A 07/14/2016   Procedure: COLONOSCOPY;  Surgeon: Danie Binder, MD;  [facility];  Service: Endoscopy;  Laterality: N/A;  8:30   FUSION OF TALONAVICULAR JOINT Left    Take Down Non-union with Revision Talonavicular and Subtalar Fusion /notes 08/26/2014   HAMMER TOE SURGERY Bilateral 2000-2013   right-left   JOINT REPLACEMENT     Right knee   KNEE ARTHROSCOPY Bilateral 2008-2010    left-right   LUMBAR DISC SURGERY  2004   Lumbar 4- 5   POLYPECTOMY  07/14/2016   Procedure: POLYPECTOMY;  Surgeon: Danie Binder, MD;  [facility];  Service: Endoscopy;;  hepatic flexure   SHOULDER ARTHROSCOPY WITH SUBACROMIAL DECOMPRESSION, ROTATOR CUFF REPAIR AND BICEP TENDON REPAIR Right 02/11/2019   Procedure: right shoulde arthroscopy, biceps tenodesis lower trapezius tendon transfer;  Surgeon: Meredith Pel, MD;  Location: [facility];  Service: Orthopedics;  Laterality: Right;   TOTAL KNEE ARTHROPLASTY Right 2013   TUBAL LIGATION     Social History:   reports that she quit smoking about 33 years ago. Her smoking use included cigarettes. She has a 5.00 pack-year smoking history. She has never used smokeless tobacco. She reports current alcohol use. She reports that she does not use drugs.  Family History  Problem Relation Age of Onset   Heart disease Mother    Stroke Mother    Heart disease Father    Heart attack Father        MI at age 86   Diabetes Brother    Hypertension Brother    Diabetes Brother    Hypertension Brother    Other Brother        Gastrointestional disease   Cancer Maternal Aunt        cervical   Diabetes Maternal Grandmother    Diabetes Paternal Grandmother     Medications: Patient's Medications  New Prescriptions   No medications on file  Previous Medications   ACETAMINOPHEN (TYLENOL) 500 MG TABLET    Take 500 mg by mouth as needed.   CALCIUM CARBONATE (CALCIUM 600) 600 MG TABS TABLET    Take 1 tablet (600 mg total) by mouth 2 (two) times daily with a meal.   CHOLECALCIFEROL (VITAMIN D3) 50 MCG (2000 UT) CAPSULE    Take 1 capsule (2,000 Units total) by mouth daily.   GLUCOSE BLOOD (ONETOUCH ULTRA) TEST STRIP    Use to check blood sugar twice daily. Dx: E11.9   METFORMIN (GLUCOPHAGE) 500 MG TABLET    Take 2 tablets (1,000 mg total) by mouth 2 (two) times daily.   NYSTATIN (MYCOSTATIN/NYSTOP) POWDER    Apply 1 application topically 2 (two) times daily.   ONETOUCH DELICA LANCETS 16X MISC    Use to check blood sugar twice daily. Dx: E11.9   ROSUVASTATIN (CRESTOR) 20 MG TABLET    TAKE 1  TABLET BY MOUTH EVERY DAY   TELMISARTAN-HYDROCHLOROTHIAZIDE (MICARDIS HCT) 40-12.5 MG TABLET    TAKE 1 TABLET BY MOUTH EVERY DAY   THYROID (ARMOUR) 60 MG TABLET    Take 1 tablet (60 mg total) by mouth daily before breakfast.   TIRZEPATIDE (MOUNJARO [place])    Inject 1 Dose into the skin once a week. Administered by [employer] in [place], Dr.Kim  Modified Medications   No medications on file  Discontinued Medications   EMPAGLIFLOZIN (JARDIANCE) 10 MG TABS TABLET    Take 10 mg by mouth daily.  Physical Exam:  Vitals:   01/06/22 1053  BP: 128/76  Pulse: 72  Temp: 97.9 F (36.6 C)  TempSrc: Temporal  SpO2: 96%  Weight: 236 lb (107 kg)  Height: 5' 1.5" (1.562 m)   Body mass index is 43.87 kg/m. Wt Readings from Last 3 Encounters:  01/06/22 236 lb (107 kg)  09/19/21 236 lb 6.4 oz (107.2 kg)  06/10/21 240 lb (108.9 kg)    Physical Exam Constitutional:      General: She is not in acute distress.    Appearance: She is well-developed. She is not diaphoretic.  HENT:     Head: Normocephalic and atraumatic.     Mouth/Throat:     Pharynx: No oropharyngeal exudate.  Eyes:     Conjunctiva/sclera: Conjunctivae normal.     Pupils: Pupils are equal, round, and reactive to light.  Cardiovascular:     Rate and Rhythm: Normal rate and regular rhythm.     Heart sounds: Normal heart sounds.  Pulmonary:     Effort: Pulmonary effort is normal.     Breath sounds: Normal breath sounds.  Abdominal:     General: Bowel sounds are normal.     Palpations: Abdomen is soft.  Musculoskeletal:     Cervical back: Normal range of motion and neck supple.     Right lower leg: No edema.     Left lower leg: No edema.  Skin:    General: Skin is warm and dry.  Neurological:     Mental Status: She is alert.  Psychiatric:        Mood and Affect: Mood normal.     Labs reviewed: Basic Metabolic Panel: Recent Labs    02/25/21 0859 06/10/21 1128 09/16/21 0817 11/07/21 0827  NA 138 141  138  --   K 4.6 4.4 4.7  --   CL 103 105 105  --   CO2 '26 27 24  '$ --   GLUCOSE 145* 115* 155*  --   BUN 23 17 26*  --   CREATININE 0.71 0.80 0.89  --   CALCIUM 9.7 9.5 9.6  --   TSH 0.07* 0.10* 0.10* 4.35   Liver Function Tests: Recent Labs    02/25/21 0859 06/10/21 1128  AST 18 24  ALT 13 18  BILITOT 0.5 0.3  PROT 6.8 6.9   No results for input(s): "LIPASE", "AMYLASE" in the last 8760 hours. No results for input(s): "AMMONIA" in the last 8760 hours. CBC: No results for input(s): "WBC", "NEUTROABS", "HGB", "HCT", "MCV", "PLT" in the last 8760 hours. Lipid Panel: Recent Labs    02/25/21 0859 09/16/21 0817  CHOL 154 141  HDL 53 50  LDLCALC 80 70  TRIG 110 129  CHOLHDL 2.9 2.8   TSH: Recent Labs    06/10/21 1128 09/16/21 0817 11/07/21 0827  TSH 0.10* 0.10* 4.35   A1C: Lab Results  Component Value Date   HGBA1C 7.1 (H) 12/13/2021     Assessment/Plan 1. Type 2 diabetes mellitus with other specified complication, without long-term current use of insulin (HCC) -she is currently taking monjaro through a weight loss program. Discussed increasing to 5 mg weekly to help with glucemic control. She continues on metformin BID with meals.  -Encouraged dietary compliance, routine foot care/monitoring and to keep up with diabetic eye exams through ophthalmology  - Microalbumin/Creatinine Ratio, Urine - Ambulatory referral to Ophthalmology  2. Hypothyroidism, unspecified type -continues on thyroid 60 mg daily, will follow up TSH today since she had so  many changes in thyroid medications.  - TSH  3. Need for influenza vaccination - Flu Vaccine QUAD High Dose(Fluad)   Return in about 4 months (around 05/09/2022) for routine follow up, labs prior . Carlos American. [place], [facility] [phone]

## 2022-01-06 NOTE — Patient Instructions (Signed)
Please sign a record release for My eye doctor, ophthalmology on check out.

## 2022-05-09 ENCOUNTER — Other Ambulatory Visit: Payer: Medicare HMO

## 2022-05-09 DIAGNOSIS — E039 Hypothyroidism, unspecified: Secondary | ICD-10-CM | POA: Diagnosis not present

## 2022-05-09 DIAGNOSIS — E1169 Type 2 diabetes mellitus with other specified complication: Secondary | ICD-10-CM | POA: Diagnosis not present

## 2022-05-10 LAB — MICROALBUMIN / CREATININE URINE RATIO
Creatinine, Urine: 145 mg/dL (ref 20–275)
Microalb Creat Ratio: 10 mcg/mg creat (ref <30)
Microalb, Ur: 1.4 mg/dL

## 2022-05-11 DIAGNOSIS — E119 Type 2 diabetes mellitus without complications: Secondary | ICD-10-CM | POA: Diagnosis not present

## 2022-05-11 DIAGNOSIS — H524 Presbyopia: Secondary | ICD-10-CM | POA: Diagnosis not present

## 2022-05-11 DIAGNOSIS — H52223 Regular astigmatism, bilateral: Secondary | ICD-10-CM | POA: Diagnosis not present

## 2022-05-11 LAB — HM DIABETES EYE EXAM

## 2022-05-12 ENCOUNTER — Ambulatory Visit: Payer: Medicare HMO | Admitting: Nurse Practitioner

## 2022-05-22 ENCOUNTER — Encounter: Payer: Self-pay | Admitting: Nurse Practitioner

## 2022-05-22 ENCOUNTER — Ambulatory Visit (INDEPENDENT_AMBULATORY_CARE_PROVIDER_SITE_OTHER): Payer: Medicare HMO | Admitting: Nurse Practitioner

## 2022-05-22 VITALS — BP 122/78 | HR 67 | Temp 97.5°F | Resp 18 | Ht 61.5 in | Wt 233.0 lb

## 2022-05-22 DIAGNOSIS — E785 Hyperlipidemia, unspecified: Secondary | ICD-10-CM | POA: Diagnosis not present

## 2022-05-22 DIAGNOSIS — E1169 Type 2 diabetes mellitus with other specified complication: Secondary | ICD-10-CM

## 2022-05-22 DIAGNOSIS — I10 Essential (primary) hypertension: Secondary | ICD-10-CM

## 2022-05-22 DIAGNOSIS — E039 Hypothyroidism, unspecified: Secondary | ICD-10-CM

## 2022-05-22 MED ORDER — TIRZEPATIDE 5 MG/0.5ML ~~LOC~~ SOAJ: 5.0000 mg | SUBCUTANEOUS | 3 refills | Status: DC

## 2022-05-22 NOTE — Progress Notes (Unsigned)
Careteam: Patient Care Team: Lauree Chandler, NP as PCP - General (Geriatric Medicine) Harl Bowie Alphonse Guild, MD as PCP - Cardiology (Cardiology)  PLACE OF SERVICE:  [place] Directive information Does Patient Have a Medical Advance Directive?: No, Would patient like information on creating a medical advance directive?: No - Patient declined  Allergies  Allergen Reactions   Naprosyn [Naproxen] Nausea Only    Can take Alleve but years ago patient took a liquid form of this medication and it made me nauseated    Chief Complaint  Patient presents with   Medical Management of Chronic Issues    Patient is here for a 4 month DM II F/U      HPI: Patient is a 67 y.o. female for routine follow up. Overall doing well. No acute concerns.   She is seeing a weight management program who had her on mounjaro She has lost 249-231 lbs.  She is paying 240$ a month for monjauro- she reports the mounjaro went up on price.   Blood pressure well controlled.   Trying to stay active.   Review of Systems:  Review of Systems  Constitutional:  Negative for chills, fever and weight loss.  HENT:  Negative for tinnitus.   Respiratory:  Negative for cough, sputum production and shortness of breath.   Cardiovascular:  Negative for chest pain, palpitations and leg swelling.  Gastrointestinal:  Negative for abdominal pain, constipation, diarrhea and heartburn.  Genitourinary:  Negative for dysuria, frequency and urgency.  Musculoskeletal:  Negative for back pain, falls, joint pain and myalgias.  Skin: Negative.   Neurological:  Negative for dizziness and headaches.  Psychiatric/Behavioral:  Negative for depression and memory loss. The patient does not have insomnia.     Past Medical History:  Diagnosis Date   Arthritis    "fingers occasionally" (08/26/2014)   Asthmatic bronchitis    "haven't used an inhaler in years" (08/26/2014)   Cervical cancer ([place])    Counseling for estrogen  replacement therapy 12/30/2014   Depression    Diabetes mellitus without complication (HCC)    type- 2   Elevated cholesterol    Exertional shortness of breath    Family history of adverse reaction to anesthesia    "I think my mama gets sick"   GERD (gastroesophageal reflux disease)    Hot flashes 07/05/2012   Hypertension    Hypothyroidism    IFG (impaired fasting glucose)    Mixed stress and urge urinary incontinence    Moody 12/30/2014   Obesity    Panniculus    PONV (postoperative nausea and vomiting)    Rectocele 07/05/2012   Stress incontinence    Superficial fungus infection of skin    Past Surgical History:  Procedure Laterality Date   ABDOMINAL HYSTERECTOMY  1986   "partial" Uterine cancer   ANKLE FUSION Left 10/24/2013   Procedure: LEFT ANKLE SUBTALAR AND TALONAVICULAR FUSION;  Surgeon: Newt Minion, MD;  Location: [facility];  Service: Orthopedics;  Laterality: Left;   ANKLE FUSION Left 08/26/2014   Procedure: Left Foot Take Down Non-union with Revision Talonavicular and Subtalar Fusion;  Surgeon: Newt Minion, MD;  Location: [facility];  Service: Orthopedics;  Laterality: Left;   BACK SURGERY     BILATERAL SALPINGECTOMY  2010   w/LOA   COLONOSCOPY     COLONOSCOPY N/A 07/14/2016   Procedure: COLONOSCOPY;  Surgeon: Danie Binder, MD;  [facility];  Service: Endoscopy;  Laterality: N/A;  8:30   FUSION OF TALONAVICULAR JOINT Left    Take Down Non-union with Revision Talonavicular and Subtalar Fusion /notes 08/26/2014   HAMMER TOE SURGERY Bilateral 2000-2013   right-left   JOINT REPLACEMENT     Right knee   KNEE ARTHROSCOPY Bilateral 2008-2010    left-right   LUMBAR DISC SURGERY  2004   Lumbar 4- 5   POLYPECTOMY  07/14/2016   Procedure: POLYPECTOMY;  Surgeon: Danie Binder, MD;  [facility];  Service: Endoscopy;;  hepatic flexure   SHOULDER ARTHROSCOPY WITH SUBACROMIAL DECOMPRESSION, ROTATOR CUFF REPAIR AND BICEP TENDON REPAIR Right 02/11/2019    Procedure: right shoulde arthroscopy, biceps tenodesis lower trapezius tendon transfer;  Surgeon: Meredith Pel, MD;  Location: [facility];  Service: Orthopedics;  Laterality: Right;   TOTAL KNEE ARTHROPLASTY Right 2013   TUBAL LIGATION     Social History:   reports that she quit smoking about 34 years ago. Her smoking use included cigarettes. She has a 5.00 pack-year smoking history. She has never used smokeless tobacco. She reports current alcohol use. She reports that she does not use drugs.  Family History  Problem Relation Age of Onset   Heart disease Mother    Stroke Mother    Heart disease Father    Heart attack Father        MI at age 30   Diabetes Brother    Hypertension Brother    Diabetes Brother    Hypertension Brother    Other Brother        Gastrointestional disease   Cancer Maternal Aunt        cervical   Diabetes Maternal Grandmother    Diabetes Paternal Grandmother     Medications: Patient's Medications  New Prescriptions   TIRZEPATIDE (MOUNJARO) 5 MG/0.5ML PEN    Inject 5 mg into the skin once a week.  Previous Medications   ACETAMINOPHEN (TYLENOL) 500 MG TABLET    Take 500 mg by mouth as needed.   CALCIUM CARBONATE (CALCIUM 600) 600 MG TABS TABLET    Take 1 tablet (600 mg total) by mouth 2 (two) times daily with a meal.   CHOLECALCIFEROL (VITAMIN D3) 50 MCG (2000 UT) CAPSULE    Take 1 capsule (2,000 Units total) by mouth daily.   GLUCOSE BLOOD (ONETOUCH ULTRA) TEST STRIP    Use to check blood sugar twice daily. Dx: E11.9   METFORMIN (GLUCOPHAGE) 500 MG TABLET    Take 2 tablets (1,000 mg total) by mouth 2 (two) times daily.   NYSTATIN (MYCOSTATIN/NYSTOP) POWDER    Apply 1 application topically 2 (two) times daily.   ONETOUCH DELICA LANCETS 99991111 MISC    Use to check blood sugar twice daily. Dx: E11.9   ROSUVASTATIN (CRESTOR) 20 MG TABLET    TAKE 1 TABLET BY MOUTH EVERY DAY   TELMISARTAN-HYDROCHLOROTHIAZIDE (MICARDIS HCT) 40-12.5 MG TABLET    TAKE 1 TABLET BY  MOUTH EVERY DAY   THYROID (ARMOUR) 60 MG TABLET    Take 1 tablet (60 mg total) by mouth daily before breakfast.  Modified Medications   No medications on file  Discontinued Medications   TIRZEPATIDE (MOUNJARO [place])    Inject 1 Dose into the skin once a week. Administered by [employer] in [place], Dr.Kim    Physical Exam:  Vitals:   05/22/22 1516  BP: 122/78  Pulse: 67  Resp: 18  Temp: (!) 97.5 F (36.4 C)  SpO2: 93%  Weight: 233 lb (105.7 kg)  Height: 5' 1.5" (1.562 m)   Body mass index is 43.31 kg/m. Wt Readings from Last 3 Encounters:  05/22/22 233 lb (105.7 kg)  01/06/22 236 lb (107 kg)  09/19/21 236 lb 6.4 oz (107.2 kg)    Physical Exam Constitutional:      General: She is not in acute distress.    Appearance: She is well-developed. She is not diaphoretic.  HENT:     Head: Normocephalic and atraumatic.     Mouth/Throat:     Pharynx: No oropharyngeal exudate.  Eyes:     Conjunctiva/sclera: Conjunctivae normal.     Pupils: Pupils are equal, round, and reactive to light.  Cardiovascular:     Rate and Rhythm: Normal rate and regular rhythm.     Heart sounds: Normal heart sounds.  Pulmonary:     Effort: Pulmonary effort is normal.     Breath sounds: Normal breath sounds.  Abdominal:     General: Bowel sounds are normal.     Palpations: Abdomen is soft.  Musculoskeletal:     Cervical back: Normal range of motion and neck supple.     Right lower leg: No edema.     Left lower leg: No edema.  Skin:    General: Skin is warm and dry.  Neurological:     Mental Status: She is alert.  Psychiatric:        Mood and Affect: Mood normal.     Labs reviewed: Basic Metabolic Panel: Recent Labs    06/10/21 1128 09/16/21 0817 11/07/21 0827 01/06/22 1115 05/22/22 1527  NA 141 138  --   --  138  K 4.4 4.7  --   --  4.1  CL 105 105  --   --  104  CO2 27 24  --   --  25  GLUCOSE 115* 155*  --   --  161*  BUN 17 26*  --   --  26*  CREATININE 0.80 0.89   --   --  0.93  CALCIUM 9.5 9.6  --   --  9.5  TSH 0.10* 0.10* 4.35 3.78  --    Liver Function Tests: Recent Labs    06/10/21 1128 05/22/22 1527  AST 24 24  ALT 18 15  BILITOT 0.3 0.3  PROT 6.9 7.0   No results for input(s): "LIPASE", "AMYLASE" in the last 8760 hours. No results for input(s): "AMMONIA" in the last 8760 hours. CBC: Recent Labs    05/22/22 1527  WBC 9.1  NEUTROABS 5,396  HGB 12.5  HCT 37.0  MCV 88.3  PLT 283   Lipid Panel: Recent Labs    09/16/21 0817  CHOL 141  HDL 50  LDLCALC 70  TRIG 129  CHOLHDL 2.8   TSH: Recent Labs    09/16/21 0817 11/07/21 0827 01/06/22 1115  TSH 0.10* 4.35 3.78   A1C: Lab Results  Component Value Date   HGBA1C 6.3 (H) 05/22/2022     Assessment/Plan 1. Type 2 diabetes mellitus with other specified complication, without long-term current use of insulin (HCC) -Encouraged dietary compliance, routine foot care/monitoring and to keep up with diabetic eye exams through ophthalmology  - Hemoglobin A1c - tirzepatide (MOUNJARO) 5 MG/0.5ML Pen; Inject 5 mg into the skin once a week.  Dispense: 6 mL; Refill: 3  2. Hypothyroidism, unspecified type -TSH now at goal, continues on thyroid 60 mg daily   3. Hyperlipidemia LDL goal <70 -continues on crestor 20 mg daily with dietary modifications -will follow  up lipids next follow up   4. Essential hypertension Blood pressure well controlled, goal bp <140/90 Continue current medications and dietary modifications follow metabolic panel - CBC with Differential/Platelet - Complete Metabolic Panel with eGFR  5. Morbid (severe) obesity due to excess calories [facility]) --education provided on healthy weight loss through increase in physical activity and proper nutrition    Return in about 4 months (around 09/20/2022) for routine follow up .  Carlos American. [place], [facility] [phone]

## 2022-05-23 LAB — CBC WITH DIFFERENTIAL/PLATELET
Absolute Monocytes: 610 cells/uL (ref 200–950)
Basophils Absolute: 82 cells/uL (ref 0–200)
Basophils Relative: 0.9 %
Eosinophils Absolute: 419 cells/uL (ref 15–500)
Eosinophils Relative: 4.6 %
HCT: 37 % (ref 35.0–45.0)
Hemoglobin: 12.5 g/dL (ref 11.7–15.5)
Lymphs Abs: 2594 cells/uL (ref 850–3900)
MCH: 29.8 pg (ref 27.0–33.0)
MCHC: 33.8 g/dL (ref 32.0–36.0)
MCV: 88.3 fL (ref 80.0–100.0)
MPV: 9.9 fL (ref 7.5–12.5)
Monocytes Relative: 6.7 %
Neutro Abs: 5396 cells/uL (ref 1500–7800)
Neutrophils Relative %: 59.3 %
Platelets: 283 10*3/uL (ref 140–400)
RBC: 4.19 10*6/uL (ref 3.80–5.10)
RDW: 13 % (ref 11.0–15.0)
Total Lymphocyte: 28.5 %
WBC: 9.1 10*3/uL (ref 3.8–10.8)

## 2022-05-23 LAB — COMPLETE METABOLIC PANEL WITH GFR
AG Ratio: 1.5 (calc) (ref 1.0–2.5)
ALT: 15 U/L (ref 6–29)
AST: 24 U/L (ref 10–35)
Albumin: 4.2 g/dL (ref 3.6–5.1)
Alkaline phosphatase (APISO): 65 U/L (ref 37–153)
BUN/Creatinine Ratio: 28 (calc) — ABNORMAL HIGH (ref 6–22)
BUN: 26 mg/dL — ABNORMAL HIGH (ref 7–25)
CO2: 25 mmol/L (ref 20–32)
Calcium: 9.5 mg/dL (ref 8.6–10.4)
Chloride: 104 mmol/L (ref 98–110)
Creat: 0.93 mg/dL (ref 0.50–1.05)
Globulin: 2.8 g/dL (calc) (ref 1.9–3.7)
Glucose, Bld: 161 mg/dL — ABNORMAL HIGH (ref 65–99)
Potassium: 4.1 mmol/L (ref 3.5–5.3)
Sodium: 138 mmol/L (ref 135–146)
Total Bilirubin: 0.3 mg/dL (ref 0.2–1.2)
Total Protein: 7 g/dL (ref 6.1–8.1)
eGFR: 68 mL/min/{1.73_m2} (ref >=60)

## 2022-05-23 LAB — HEMOGLOBIN A1C
Hgb A1c MFr Bld: 6.3 % of total Hgb — ABNORMAL HIGH (ref <5.7)
Mean Plasma Glucose: 134 mg/dL
eAG (mmol/L): 7.4 mmol/L

## 2022-06-27 ENCOUNTER — Other Ambulatory Visit: Payer: Self-pay | Admitting: Adult Health

## 2022-07-16 ENCOUNTER — Other Ambulatory Visit: Payer: Self-pay | Admitting: Nurse Practitioner

## 2022-07-16 DIAGNOSIS — E119 Type 2 diabetes mellitus without complications: Secondary | ICD-10-CM

## 2022-09-19 ENCOUNTER — Other Ambulatory Visit: Payer: Self-pay

## 2022-09-19 ENCOUNTER — Other Ambulatory Visit: Payer: Medicare HMO

## 2022-09-19 DIAGNOSIS — I10 Essential (primary) hypertension: Secondary | ICD-10-CM | POA: Diagnosis not present

## 2022-09-19 DIAGNOSIS — E785 Hyperlipidemia, unspecified: Secondary | ICD-10-CM | POA: Diagnosis not present

## 2022-09-19 DIAGNOSIS — E1169 Type 2 diabetes mellitus with other specified complication: Secondary | ICD-10-CM | POA: Diagnosis not present

## 2022-09-19 DIAGNOSIS — E039 Hypothyroidism, unspecified: Secondary | ICD-10-CM

## 2022-09-20 LAB — COMPLETE METABOLIC PANEL WITH GFR
AG Ratio: 1.4 (calc) (ref 1.0–2.5)
ALT: 12 U/L (ref 6–29)
AST: 17 U/L (ref 10–35)
Albumin: 4.1 g/dL (ref 3.6–5.1)
Alkaline phosphatase (APISO): 67 U/L (ref 37–153)
BUN/Creatinine Ratio: 28 (calc) — ABNORMAL HIGH (ref 6–22)
BUN: 30 mg/dL — ABNORMAL HIGH (ref 7–25)
CO2: 24 mmol/L (ref 20–32)
Calcium: 9.8 mg/dL (ref 8.6–10.4)
Chloride: 104 mmol/L (ref 98–110)
Creat: 1.06 mg/dL — ABNORMAL HIGH (ref 0.50–1.05)
Globulin: 3 g/dL (calc) (ref 1.9–3.7)
Glucose, Bld: 124 mg/dL — ABNORMAL HIGH (ref 65–99)
Potassium: 4.3 mmol/L (ref 3.5–5.3)
Sodium: 138 mmol/L (ref 135–146)
Total Bilirubin: 0.3 mg/dL (ref 0.2–1.2)
Total Protein: 7.1 g/dL (ref 6.1–8.1)
eGFR: 58 mL/min/{1.73_m2} — ABNORMAL LOW (ref >=60)

## 2022-09-20 LAB — LIPID PANEL
Cholesterol: 159 mg/dL (ref <200)
HDL: 58 mg/dL (ref >=50)
LDL Cholesterol (Calc): 81 mg/dL (calc)
Non-HDL Cholesterol (Calc): 101 mg/dL (calc) (ref <130)
Total CHOL/HDL Ratio: 2.7 (calc) (ref <5.0)
Triglycerides: 103 mg/dL (ref <150)

## 2022-09-20 LAB — TSH: TSH: 7.25 mIU/L — ABNORMAL HIGH (ref 0.40–4.50)

## 2022-09-22 ENCOUNTER — Ambulatory Visit (INDEPENDENT_AMBULATORY_CARE_PROVIDER_SITE_OTHER): Payer: Medicare HMO | Admitting: Nurse Practitioner

## 2022-09-22 ENCOUNTER — Encounter: Payer: Self-pay | Admitting: Nurse Practitioner

## 2022-09-22 VITALS — BP 133/88 | HR 76 | Temp 97.8°F | Resp 18 | Ht 63.0 in | Wt 225.6 lb

## 2022-09-22 DIAGNOSIS — I10 Essential (primary) hypertension: Secondary | ICD-10-CM

## 2022-09-22 DIAGNOSIS — E1169 Type 2 diabetes mellitus with other specified complication: Secondary | ICD-10-CM | POA: Diagnosis not present

## 2022-09-22 DIAGNOSIS — E785 Hyperlipidemia, unspecified: Secondary | ICD-10-CM | POA: Diagnosis not present

## 2022-09-22 DIAGNOSIS — E039 Hypothyroidism, unspecified: Secondary | ICD-10-CM

## 2022-09-22 DIAGNOSIS — G8929 Other chronic pain: Secondary | ICD-10-CM | POA: Diagnosis not present

## 2022-09-22 DIAGNOSIS — M545 Low back pain, unspecified: Secondary | ICD-10-CM | POA: Diagnosis not present

## 2022-09-22 NOTE — Patient Instructions (Signed)
To use heating pad to back twice daily Can use muscle rub after heat

## 2022-09-22 NOTE — Progress Notes (Signed)
Careteam: Patient Care Team: Sharon Seller, NP as PCP - General (Geriatric Medicine) Wyline Mood Dorothe Pea, MD as PCP - Cardiology (Cardiology)  PLACE OF SERVICE:  [facility]  Advanced Directive information    Allergies  Allergen Reactions   Naprosyn [Naproxen] Nausea Only    Can take Alleve but years ago patient took a liquid form of this medication and it made me nauseated    Chief Complaint  Patient presents with   Follow-up    4 mths routine   Quality Metric Gaps    Needs to discuss Shingrix vaccine and Covid vaccine, Foot exam.     HPI: Patient is a 67 y.o. female for routine follow up.  TSH mildly elevated at 7.25. on thyroid 60 mg daily. Feels like she has been compliant with her medication.   DM- on mounjaro 5 mg weekly with metformin 1000 mg twice daily   Not urinating as much.   She is having some right lower back pain. No sciatic pain.   Review of Systems:  Review of Systems  Constitutional:  Negative for chills, fever and weight loss.  HENT:  Negative for tinnitus.   Respiratory:  Negative for cough, sputum production and shortness of breath.   Cardiovascular:  Negative for chest pain, palpitations and leg swelling.  Gastrointestinal:  Negative for abdominal pain, constipation, diarrhea and heartburn.  Genitourinary:  Negative for dysuria, frequency and urgency.  Musculoskeletal:  Negative for back pain, falls, joint pain and myalgias.  Skin: Negative.   Neurological:  Negative for dizziness and headaches.  Psychiatric/Behavioral:  Negative for depression and memory loss. The patient does not have insomnia.     Past Medical History:  Diagnosis Date   Arthritis    "fingers occasionally" (08/26/2014)   Asthmatic bronchitis    "haven't used an inhaler in years" (08/26/2014)   Cervical cancer (HCC)    Counseling for estrogen replacement therapy 12/30/2014   Depression    Diabetes mellitus without complication (HCC)    type- 2   Elevated  cholesterol    Exertional shortness of breath    Family history of adverse reaction to anesthesia    "I think my mama gets sick"   GERD (gastroesophageal reflux disease)    Hot flashes 07/05/2012   Hypertension    Hypothyroidism    IFG (impaired fasting glucose)    Mixed stress and urge urinary incontinence    Moody 12/30/2014   Obesity    Panniculus    PONV (postoperative nausea and vomiting)    Rectocele 07/05/2012   Stress incontinence    Superficial fungus infection of skin    Past Surgical History:  Procedure Laterality Date   ABDOMINAL HYSTERECTOMY  1986   "partial" Uterine cancer   ANKLE FUSION Left 10/24/2013   Procedure: LEFT ANKLE SUBTALAR AND TALONAVICULAR FUSION;  Surgeon: Nadara Mustard, MD;  [facility];  Service: Orthopedics;  Laterality: Left;   ANKLE FUSION Left 08/26/2014   Procedure: Left Foot Take Down Non-union with Revision Talonavicular and Subtalar Fusion;  Surgeon: Nadara Mustard, MD;  Location: [facility] OR;  Service: Orthopedics;  Laterality: Left;   BACK SURGERY     BILATERAL SALPINGECTOMY  2010   w/LOA   COLONOSCOPY     COLONOSCOPY N/A 07/14/2016   Procedure: COLONOSCOPY;  Surgeon: West Bali, MD;  [facility]: [facility];  Service: Endoscopy;  Laterality: N/A;  8:30   FUSION OF TALONAVICULAR JOINT Left    Take Down Non-union with  Revision Talonavicular and Subtalar Fusion /notes 08/26/2014   HAMMER TOE SURGERY Bilateral 2000-2013   right-left   JOINT REPLACEMENT     Right knee   KNEE ARTHROSCOPY Bilateral 2008-2010    left-right   LUMBAR DISC SURGERY  2004   Lumbar 4- 5   POLYPECTOMY  07/14/2016   Procedure: POLYPECTOMY;  Surgeon: West Bali, MD;  [facility];  Service: Endoscopy;;  hepatic flexure   SHOULDER ARTHROSCOPY WITH SUBACROMIAL DECOMPRESSION, ROTATOR CUFF REPAIR AND BICEP TENDON REPAIR Right 02/11/2019   Procedure: right shoulde arthroscopy, biceps tenodesis lower trapezius tendon transfer;  Surgeon: Cammy Copa, MD;   Location: [facility] OR;  Service: Orthopedics;  Laterality: Right;   TOTAL KNEE ARTHROPLASTY Right 2013   TUBAL LIGATION     Social History:   reports that she quit smoking about 34 years ago. Her smoking use included cigarettes. She has a 5.00 pack-year smoking history. She has never used smokeless tobacco. She reports current alcohol use. She reports that she does not use drugs.  Family History  Problem Relation Age of Onset   Heart disease Mother    Stroke Mother    Heart disease Father    Heart attack Father        MI at age 41   Diabetes Brother    Hypertension Brother    Diabetes Brother    Hypertension Brother    Other Brother        Gastrointestional disease   Cancer Maternal Aunt        cervical   Diabetes Maternal Grandmother    Diabetes Paternal Grandmother     Medications: Patient's Medications  New Prescriptions   No medications on file  Previous Medications   ACETAMINOPHEN (TYLENOL) 500 MG TABLET    Take 500 mg by mouth as needed.   CALCIUM CARBONATE (CALCIUM 600) 600 MG TABS TABLET    Take 1 tablet (600 mg total) by mouth 2 (two) times daily with a meal.   CHOLECALCIFEROL (VITAMIN D3) 50 MCG (2000 UT) CAPSULE    Take 1 capsule (2,000 Units total) by mouth daily.   GLUCOSE BLOOD (ONETOUCH ULTRA) TEST STRIP    Use to check blood sugar twice daily. Dx: E11.9   METFORMIN (GLUCOPHAGE) 500 MG TABLET    Take 2 tablets by mouth twice daily   NYSTATIN (MYCOSTATIN/NYSTOP) POWDER    APPLY TO AFFECTED AREA TWICE A DAY   ONETOUCH DELICA LANCETS 33G MISC    Use to check blood sugar twice daily. Dx: E11.9   ROSUVASTATIN (CRESTOR) 20 MG TABLET    TAKE 1 TABLET BY MOUTH EVERY DAY   TELMISARTAN-HYDROCHLOROTHIAZIDE (MICARDIS HCT) 40-12.5 MG TABLET    TAKE 1 TABLET BY MOUTH EVERY DAY   THYROID (ARMOUR) 60 MG TABLET    Take 1 tablet (60 mg total) by mouth daily before breakfast.   TIRZEPATIDE (MOUNJARO) 5 MG/0.5ML PEN    Inject 5 mg into the skin once a week.  Modified Medications   No  medications on file  Discontinued Medications   No medications on file    Physical Exam:  Vitals:   09/22/22 1503  BP: 133/88  Pulse: 76  Resp: 18  Temp: 97.8 F (36.6 C)  Weight: 225 lb 9.6 oz (102.3 kg)  Height: 5\' 3"  (1.6 m)   Body mass index is 39.96 kg/m. Wt Readings from Last 3 Encounters:  09/22/22 225 lb 9.6 oz (102.3 kg)  05/22/22 233 lb (105.7 kg)  01/06/22 236  lb (107 kg)    Physical Exam Constitutional:      General: She is not in acute distress.    Appearance: She is well-developed. She is not diaphoretic.  HENT:     Head: Normocephalic and atraumatic.     Mouth/Throat:     Pharynx: No oropharyngeal exudate.  Eyes:     Conjunctiva/sclera: Conjunctivae normal.     Pupils: Pupils are equal, round, and reactive to light.  Cardiovascular:     Rate and Rhythm: Normal rate and regular rhythm.     Heart sounds: Normal heart sounds.  Pulmonary:     Effort: Pulmonary effort is normal.     Breath sounds: Normal breath sounds.  Abdominal:     General: Bowel sounds are normal.     Palpations: Abdomen is soft.  Musculoskeletal:     Cervical back: Normal range of motion and neck supple.     Right lower leg: No edema.     Left lower leg: No edema.  Skin:    General: Skin is warm and dry.  Neurological:     Mental Status: She is alert.  Psychiatric:        Mood and Affect: Mood normal.     Labs reviewed: Basic Metabolic Panel: Recent Labs    11/07/21 0827 01/06/22 1115 05/22/22 1527 09/19/22 0807  NA  --   --  138 138  K  --   --  4.1 4.3  CL  --   --  104 104  CO2  --   --  25 24  GLUCOSE  --   --  161* 124*  BUN  --   --  26* 30*  CREATININE  --   --  0.93 1.06*  CALCIUM  --   --  9.5 9.8  TSH 4.35 3.78  --  7.25*   Liver Function Tests: Recent Labs    05/22/22 1527 09/19/22 0807  AST 24 17  ALT 15 12  BILITOT 0.3 0.3  PROT 7.0 7.1   No results for input(s): "LIPASE", "AMYLASE" in the last 8760 hours. No results for input(s):  "AMMONIA" in the last 8760 hours. CBC: Recent Labs    05/22/22 1527  WBC 9.1  NEUTROABS 5,396  HGB 12.5  HCT 37.0  MCV 88.3  PLT 283   Lipid Panel: Recent Labs    09/19/22 0807  CHOL 159  HDL 58  LDLCALC 81  TRIG 103  CHOLHDL 2.7   TSH: Recent Labs    11/07/21 0827 01/06/22 1115 09/19/22 0807  TSH 4.35 3.78 7.25*   A1C: Lab Results  Component Value Date   HGBA1C 6.3 (H) 05/22/2022     Assessment/Plan 1. Type 2 diabetes mellitus with other specified complication, without long-term current use of insulin (HCC) Continue current medication at this time -Encouraged dietary compliance, routine foot care/monitoring and to keep up with diabetic eye exams through ophthalmology  - Hemoglobin A1c - Hemoglobin A1c; Future  2. Hypothyroidism, unspecified type -will continue current dose of thyroid and follow up TSH in 4 months.  - TSH; Future  3. Hyperlipidemia LDL goal <70 LDL 81, continue dietary modifications and continue crestor 20 mg daily  4. Morbid (severe) obesity due to excess calories (HCC) -has lost 10 lbs, to continue working on healthy weight loss through increase in physical activity and proper nutrition   5. Essential hypertension Blood pressure well controlled, goal bp <140/90 Continue current medications and dietary modifications follow metabolic panel - BASIC  METABOLIC PANEL WITH GFR; Future - CBC with Differential/Platelet; Future  6. Chronic right-sided low back pain without sciatica -can use heating pad to back TID -muscle rub after heat Tylenol PRN - Ambulatory referral to Physical Therapy   Return in about 4 months (around 01/22/2023) for routine follow up, labs at appt .  Janene Harvey. Biagio Borg [facility] [phone]

## 2022-09-23 LAB — HEMOGLOBIN A1C
Hgb A1c MFr Bld: 6.3 % of total Hgb — ABNORMAL HIGH (ref <5.7)
Mean Plasma Glucose: 134 mg/dL
eAG (mmol/L): 7.4 mmol/L

## 2022-09-25 NOTE — Progress Notes (Unsigned)
Office Visit Note   Patient: [name]           Date of Birth: 1956/02/06           MRN: [id number] Visit Date: 09/26/2022              Requested by: Sharon Seller, NP [address] [place]. [place],  [place] [address] PCP: Sharon Seller, NP   Assessment & Plan: Visit Diagnoses: No diagnosis found.  Plan: ***  Follow-Up Instructions: No follow-ups on file.   Orders:  No orders of the defined types were placed in this encounter.  No orders of the defined types were placed in this encounter.     Procedures: No procedures performed   Clinical Data: No additional findings.   Subjective: No chief complaint on file.   HPI  Review of Systems   Objective: Vital Signs: There were no vitals taken for this visit.  Physical Exam  Ortho Exam  Specialty Comments:  No specialty comments available.  Imaging: No results found.   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, midline 10/25/2020   Upper airway cough syndrome 07/30/2018   Morbid (severe) obesity due to excess calories (HCC) 07/30/2018   Depression, major, single episode, moderate (HCC) 10/17/2017   Hypothyroidism 09/19/2017   Mixed stress and urge urinary incontinence 02/10/2014   Diabetes (HCC) 01/18/2014   Hyperlipidemia LDL goal <100 03/10/2013   Hot flashes 07/05/2012   Rectocele 07/05/2012   Essential hypertension 07/04/2012   Stiffness of joint, lower leg, left 08/28/2011   Pain in left knee 08/28/2011   Past Medical History:  Diagnosis Date   Arthritis    "fingers occasionally" (08/26/2014)   Asthmatic bronchitis    "haven't used an inhaler in years" (08/26/2014)   Cervical cancer (HCC)    Counseling for estrogen replacement therapy 12/30/2014   Depression    Diabetes mellitus without complication (HCC)    type- 2   Elevated cholesterol    Exertional shortness of breath    Family history of adverse reaction to anesthesia     "I think my mama gets sick"   GERD (gastroesophageal reflux disease)    Hot flashes 07/05/2012   Hypertension    Hypothyroidism    IFG (impaired fasting glucose)    Mixed stress and urge urinary incontinence    Moody 12/30/2014   Obesity    Panniculus    PONV (postoperative nausea and vomiting)    Rectocele 07/05/2012   Stress incontinence    Superficial fungus infection of skin     Family History  Problem Relation Age of Onset   Heart disease Mother    Stroke Mother    Heart disease Father    Heart attack Father        MI at age 57   Diabetes Brother    Hypertension Brother    Diabetes Brother    Hypertension Brother    Other Brother        Gastrointestional disease   Cancer Maternal Aunt        cervical   Diabetes Maternal Grandmother    Diabetes Paternal Grandmother     Past Surgical History:  Procedure Laterality Date   ABDOMINAL HYSTERECTOMY  1986   "partial" Uterine cancer   ANKLE FUSION Left 10/24/2013   Procedure: LEFT ANKLE SUBTALAR AND TALONAVICULAR FUSION;  Surgeon: Nadara Mustard, MD;  [facility]: [facility] OR;  Service: Orthopedics;  Laterality: Left;   ANKLE FUSION Left 08/26/2014   Procedure: Left Foot Take Down Non-union with Revision Talonavicular and Subtalar Fusion;  Surgeon: Nadara Mustard, MD;  Location: [facility] OR;  Service: Orthopedics;  Laterality: Left;   BACK SURGERY     BILATERAL SALPINGECTOMY  2010   w/LOA   COLONOSCOPY     COLONOSCOPY N/A 07/14/2016   Procedure: COLONOSCOPY;  Surgeon: West Bali, MD;  [facility];  Service: Endoscopy;  Laterality: N/A;  8:30   FUSION OF TALONAVICULAR JOINT Left    Take Down Non-union with Revision Talonavicular and Subtalar Fusion /notes 08/26/2014   HAMMER TOE SURGERY Bilateral 2000-2013   right-left   JOINT REPLACEMENT     Right knee   KNEE ARTHROSCOPY Bilateral 2008-2010    left-right   LUMBAR DISC SURGERY  2004   Lumbar 4- 5   POLYPECTOMY  07/14/2016   Procedure: POLYPECTOMY;  Surgeon: West Bali, MD;  [facility];  Service: Endoscopy;;  hepatic flexure   SHOULDER ARTHROSCOPY WITH SUBACROMIAL DECOMPRESSION, ROTATOR CUFF REPAIR AND BICEP TENDON REPAIR Right 02/11/2019   Procedure: right shoulde arthroscopy, biceps tenodesis lower trapezius tendon transfer;  Surgeon: Cammy Copa, MD;  Location: [facility] OR;  Service: Orthopedics;  Laterality: Right;   TOTAL KNEE ARTHROPLASTY Right 2013   TUBAL LIGATION     Social History   Occupational History   Not on file  Tobacco Use   Smoking status: Former    Packs/day: 0.50    Years: 10.00    Additional pack years: 0.00    Total pack years: 5.00    Types: Cigarettes    Quit date: 03/27/1988    Years since quitting: 34.5   Smokeless tobacco: Never  Vaping Use   Vaping Use: Never used  Substance and Sexual Activity   Alcohol use: Yes    Comment: occasionally a beer   Drug use: No   Sexual activity: Not Currently    Birth control/protection: Surgical    Comment: hyst

## 2022-09-26 ENCOUNTER — Encounter: Payer: Self-pay | Admitting: Orthopaedic Surgery

## 2022-09-26 ENCOUNTER — Ambulatory Visit (INDEPENDENT_AMBULATORY_CARE_PROVIDER_SITE_OTHER): Payer: Medicare HMO | Admitting: Orthopaedic Surgery

## 2022-09-26 ENCOUNTER — Other Ambulatory Visit (INDEPENDENT_AMBULATORY_CARE_PROVIDER_SITE_OTHER): Payer: Medicare HMO

## 2022-09-26 DIAGNOSIS — M79645 Pain in left finger(s): Secondary | ICD-10-CM

## 2022-09-26 DIAGNOSIS — M1812 Unilateral primary osteoarthritis of first carpometacarpal joint, left hand: Secondary | ICD-10-CM

## 2022-09-30 ENCOUNTER — Other Ambulatory Visit: Payer: Self-pay | Admitting: Nurse Practitioner

## 2022-09-30 DIAGNOSIS — I10 Essential (primary) hypertension: Secondary | ICD-10-CM

## 2022-10-09 ENCOUNTER — Other Ambulatory Visit: Payer: Self-pay | Admitting: Nurse Practitioner

## 2022-10-09 DIAGNOSIS — E119 Type 2 diabetes mellitus without complications: Secondary | ICD-10-CM

## 2022-10-18 ENCOUNTER — Encounter (HOSPITAL_BASED_OUTPATIENT_CLINIC_OR_DEPARTMENT_OTHER): Payer: Self-pay | Admitting: Orthopaedic Surgery

## 2022-10-20 ENCOUNTER — Encounter (HOSPITAL_BASED_OUTPATIENT_CLINIC_OR_DEPARTMENT_OTHER)
Admission: RE | Admit: 2022-10-20 | Discharge: 2022-10-20 | Disposition: A | Discharge status: Home / Self Care | Payer: Medicare HMO | Source: Ambulatory Visit | Attending: Orthopaedic Surgery | Admitting: Orthopaedic Surgery

## 2022-10-20 DIAGNOSIS — Z01810 Encounter for preprocedural cardiovascular examination: Secondary | ICD-10-CM | POA: Diagnosis not present

## 2022-10-20 DIAGNOSIS — Z01812 Encounter for preprocedural laboratory examination: Secondary | ICD-10-CM | POA: Insufficient documentation

## 2022-10-20 DIAGNOSIS — M1812 Unilateral primary osteoarthritis of first carpometacarpal joint, left hand: Secondary | ICD-10-CM | POA: Insufficient documentation

## 2022-10-20 DIAGNOSIS — Z01818 Encounter for other preprocedural examination: Secondary | ICD-10-CM | POA: Diagnosis not present

## 2022-10-20 LAB — BASIC METABOLIC PANEL
Anion gap: 12 (ref 5–15)
BUN: 22 mg/dL (ref 8–23)
CO2: 24 mmol/L (ref 22–32)
Calcium: 9.4 mg/dL (ref 8.9–10.3)
Chloride: 101 mmol/L (ref 98–111)
Creatinine, Ser: 0.93 mg/dL (ref 0.44–1.00)
GFR, Estimated: >60 mL/min (ref >60)
Glucose, Bld: 129 mg/dL — ABNORMAL HIGH (ref 70–99)
Potassium: 4.9 mmol/L (ref 3.5–5.1)
Sodium: 137 mmol/L (ref 135–145)

## 2022-10-20 NOTE — Progress Notes (Signed)

## 2022-10-23 ENCOUNTER — Other Ambulatory Visit: Payer: Self-pay | Admitting: Physician Assistant

## 2022-10-23 MED ORDER — HYDROCODONE-ACETAMINOPHEN 5-325 MG PO TABS: 1.0000 | ORAL_TABLET | Freq: Three times a day (TID) | ORAL | 0 refills | Status: DC | PRN

## 2022-10-23 MED ORDER — ONDANSETRON HCL 4 MG PO TABS: 4.0000 mg | ORAL_TABLET | Freq: Three times a day (TID) | ORAL | 0 refills | Status: DC | PRN

## 2022-10-25 ENCOUNTER — Ambulatory Visit (HOSPITAL_BASED_OUTPATIENT_CLINIC_OR_DEPARTMENT_OTHER)
Admission: RE | Admit: 2022-10-25 | Discharge: 2022-10-25 | Disposition: A | Discharge status: Home / Self Care | Payer: Medicare HMO | Attending: Orthopaedic Surgery | Admitting: Orthopaedic Surgery

## 2022-10-25 ENCOUNTER — Encounter (HOSPITAL_BASED_OUTPATIENT_CLINIC_OR_DEPARTMENT_OTHER)
Admission: RE | Disposition: A | Discharge status: Home / Self Care | Payer: Self-pay | Source: Home / Self Care | Attending: Orthopaedic Surgery

## 2022-10-25 ENCOUNTER — Ambulatory Visit (HOSPITAL_BASED_OUTPATIENT_CLINIC_OR_DEPARTMENT_OTHER): Payer: Medicare HMO

## 2022-10-25 ENCOUNTER — Other Ambulatory Visit: Payer: Self-pay

## 2022-10-25 ENCOUNTER — Encounter (HOSPITAL_BASED_OUTPATIENT_CLINIC_OR_DEPARTMENT_OTHER): Payer: Self-pay | Admitting: Orthopaedic Surgery

## 2022-10-25 ENCOUNTER — Ambulatory Visit (HOSPITAL_BASED_OUTPATIENT_CLINIC_OR_DEPARTMENT_OTHER): Payer: Medicare HMO | Admitting: Anesthesiology

## 2022-10-25 ENCOUNTER — Encounter: Payer: Self-pay | Admitting: Orthopaedic Surgery

## 2022-10-25 DIAGNOSIS — I10 Essential (primary) hypertension: Secondary | ICD-10-CM | POA: Insufficient documentation

## 2022-10-25 DIAGNOSIS — E119 Type 2 diabetes mellitus without complications: Secondary | ICD-10-CM | POA: Insufficient documentation

## 2022-10-25 DIAGNOSIS — Z79890 Hormone replacement therapy (postmenopausal): Secondary | ICD-10-CM | POA: Insufficient documentation

## 2022-10-25 DIAGNOSIS — M1812 Unilateral primary osteoarthritis of first carpometacarpal joint, left hand: Secondary | ICD-10-CM | POA: Diagnosis not present

## 2022-10-25 DIAGNOSIS — E669 Obesity, unspecified: Secondary | ICD-10-CM | POA: Insufficient documentation

## 2022-10-25 DIAGNOSIS — K219 Gastro-esophageal reflux disease without esophagitis: Secondary | ICD-10-CM | POA: Insufficient documentation

## 2022-10-25 DIAGNOSIS — E7800 Pure hypercholesterolemia, unspecified: Secondary | ICD-10-CM | POA: Diagnosis not present

## 2022-10-25 DIAGNOSIS — Z87891 Personal history of nicotine dependence: Secondary | ICD-10-CM

## 2022-10-25 DIAGNOSIS — F32A Depression, unspecified: Secondary | ICD-10-CM | POA: Diagnosis not present

## 2022-10-25 DIAGNOSIS — E039 Hypothyroidism, unspecified: Secondary | ICD-10-CM | POA: Insufficient documentation

## 2022-10-25 DIAGNOSIS — E1169 Type 2 diabetes mellitus with other specified complication: Secondary | ICD-10-CM

## 2022-10-25 DIAGNOSIS — Z7984 Long term (current) use of oral hypoglycemic drugs: Secondary | ICD-10-CM | POA: Diagnosis not present

## 2022-10-25 DIAGNOSIS — Z8541 Personal history of malignant neoplasm of cervix uteri: Secondary | ICD-10-CM | POA: Diagnosis not present

## 2022-10-25 HISTORY — PX: CARPOMETACARPEL SUSPENSION PLASTY: SHX5005

## 2022-10-25 LAB — GLUCOSE, CAPILLARY
Glucose-Capillary: 94 mg/dL (ref 70–99)
Glucose-Capillary: 90 mg/dL (ref 70–99)

## 2022-10-25 SURGERY — CARPOMETACARPEL (CMC) SUSPENSION PLASTY
Anesthesia: Regional | Site: Thumb | Laterality: Left

## 2022-10-25 MED ORDER — CEFAZOLIN SODIUM-DEXTROSE 2-4 GM/100ML-% IV SOLN
2.0000 g | INTRAVENOUS | Status: AC
  Administered 2022-10-25: 2 g via INTRAVENOUS

## 2022-10-25 MED ORDER — FENTANYL CITRATE (PF) 100 MCG/2ML IJ SOLN: 25.0000 ug | INTRAMUSCULAR | Status: DC | PRN

## 2022-10-25 MED ORDER — MIDAZOLAM HCL 2 MG/2ML IJ SOLN
INTRAMUSCULAR | Status: AC
  Filled 2022-10-25: qty 2

## 2022-10-25 MED ORDER — PROPOFOL 500 MG/50ML IV EMUL
INTRAVENOUS | Status: DC | PRN
  Administered 2022-10-25: 100 ug/kg/min via INTRAVENOUS

## 2022-10-25 MED ORDER — ONDANSETRON HCL 4 MG/2ML IJ SOLN: 4.0000 mg | Freq: Once | INTRAMUSCULAR | Status: DC | PRN

## 2022-10-25 MED ORDER — AMISULPRIDE (ANTIEMETIC) 5 MG/2ML IV SOLN: 10.0000 mg | Freq: Once | INTRAVENOUS | Status: DC | PRN

## 2022-10-25 MED ORDER — LIDOCAINE 2% (20 MG/ML) 5 ML SYRINGE
INTRAMUSCULAR | Status: DC | PRN
  Administered 2022-10-25: 40 mg via INTRAVENOUS

## 2022-10-25 MED ORDER — LACTATED RINGERS IV SOLN: INTRAVENOUS | Status: DC

## 2022-10-25 MED ORDER — MIDAZOLAM HCL 2 MG/2ML IJ SOLN
2.0000 mg | Freq: Once | INTRAMUSCULAR | Status: AC
  Administered 2022-10-25: 2 mg via INTRAVENOUS

## 2022-10-25 MED ORDER — ROPIVACAINE HCL 5 MG/ML IJ SOLN
INTRAMUSCULAR | Status: DC | PRN
  Administered 2022-10-25: 30 mL via PERINEURAL

## 2022-10-25 MED ORDER — ONDANSETRON HCL 4 MG/2ML IJ SOLN
INTRAMUSCULAR | Status: DC | PRN
  Administered 2022-10-25: 4 mg via INTRAVENOUS

## 2022-10-25 MED ORDER — FENTANYL CITRATE (PF) 100 MCG/2ML IJ SOLN
INTRAMUSCULAR | Status: AC
  Filled 2022-10-25: qty 2

## 2022-10-25 MED ORDER — CEFAZOLIN SODIUM-DEXTROSE 2-4 GM/100ML-% IV SOLN
INTRAVENOUS | Status: AC
  Filled 2022-10-25: qty 100

## 2022-10-25 MED ORDER — ACETAMINOPHEN 10 MG/ML IV SOLN: 1000.0000 mg | Freq: Once | INTRAVENOUS | Status: DC | PRN

## 2022-10-25 MED ORDER — PROPOFOL 10 MG/ML IV BOLUS
INTRAVENOUS | Status: DC | PRN
Start: 2022-10-25 — End: 2022-10-25
  Administered 2022-10-25: 50 mg via INTRAVENOUS
  Administered 2022-10-25: 30 mg via INTRAVENOUS
  Administered 2022-10-25: 20 mg via INTRAVENOUS

## 2022-10-25 MED ORDER — DEXMEDETOMIDINE HCL IN NACL 80 MCG/20ML IV SOLN
INTRAVENOUS | Status: DC | PRN
  Administered 2022-10-25: 8 ug via INTRAVENOUS

## 2022-10-25 MED ORDER — PHENYLEPHRINE HCL (PRESSORS) 10 MG/ML IV SOLN
INTRAVENOUS | Status: DC | PRN
  Administered 2022-10-25 (×2): 80 ug via INTRAVENOUS

## 2022-10-25 SURGICAL SUPPLY — 70 items
ANCHOR FIBERLOCK SUSPENSION (Anchor) IMPLANT
BAND INSRT 18 STRL LF DISP RB (MISCELLANEOUS) ×1
BAND RUBBER #18 3X1/16 STRL (MISCELLANEOUS) ×2 IMPLANT
BLADE MINI RND TIP GREEN BEAV (BLADE) ×2 IMPLANT
BLADE SURG 15 STRL LF DISP TIS (BLADE) ×4 IMPLANT
BLADE SURG 15 STRL SS (BLADE) ×2
BNDG CMPR 5X2 KNTD ELC UNQ LF (GAUZE/BANDAGES/DRESSINGS) ×1
BNDG CMPR 5X3 KNIT ELC UNQ LF (GAUZE/BANDAGES/DRESSINGS) ×1
BNDG CMPR 9X4 STRL LF SNTH (GAUZE/BANDAGES/DRESSINGS) ×1
BNDG ELASTIC 2INX 5YD STR LF (GAUZE/BANDAGES/DRESSINGS) IMPLANT
BNDG ELASTIC 3INX 5YD STR LF (GAUZE/BANDAGES/DRESSINGS) ×2 IMPLANT
BNDG ESMARK 4X9 LF (GAUZE/BANDAGES/DRESSINGS) ×2 IMPLANT
BRUSH SCRUB EZ PLAIN DRY (MISCELLANEOUS) ×2 IMPLANT
CORD BIPOLAR FORCEPS 12FT (ELECTRODE) ×2 IMPLANT
COVER BACK TABLE 60X90IN (DRAPES) ×2 IMPLANT
CUFF TOURN SGL QUICK 18X4 (TOURNIQUET CUFF) IMPLANT
DRAPE EXTREMITY T 121X128X90 (DISPOSABLE) ×2 IMPLANT
DRAPE IMP U-DRAPE 54X76 (DRAPES) ×2 IMPLANT
DRAPE OEC MINIVIEW 54X84 (DRAPES) IMPLANT
DRAPE SURG 17X23 STRL (DRAPES) ×2 IMPLANT
GAUZE 4X4 16PLY ~~LOC~~+RFID DBL (SPONGE) IMPLANT
GAUZE SPONGE 4X4 12PLY STRL (GAUZE/BANDAGES/DRESSINGS) ×2 IMPLANT
GAUZE XEROFORM 1X8 LF (GAUZE/BANDAGES/DRESSINGS) ×2 IMPLANT
GLOVE BIOGEL PI IND STRL 7.5 (GLOVE) ×2 IMPLANT
GLOVE ECLIPSE 7.0 STRL STRAW (GLOVE) ×2 IMPLANT
GLOVE INDICATOR 7.0 STRL GRN (GLOVE) ×2 IMPLANT
GLOVE SURG SYN 7.5 E (GLOVE) ×2
GLOVE SURG SYN 7.5 PF PI (GLOVE) ×4 IMPLANT
GOWN STRL REUS W/ TWL LRG LVL3 (GOWN DISPOSABLE) ×2 IMPLANT
GOWN STRL REUS W/ TWL XL LVL3 (GOWN DISPOSABLE) ×2 IMPLANT
GOWN STRL REUS W/TWL LRG LVL3 (GOWN DISPOSABLE) ×1
GOWN STRL REUS W/TWL XL LVL3 (GOWN DISPOSABLE) ×1
GOWN STRL SURGICAL XL XLNG (GOWN DISPOSABLE) ×4 IMPLANT
NDL HYPO 25X1 1.5 SAFETY (NEEDLE) IMPLANT
NEEDLE HYPO 25X1 1.5 SAFETY (NEEDLE)
NS IRRIG 1000ML POUR BTL (IV SOLUTION) ×2 IMPLANT
PACK BASIN DAY SURGERY FS (CUSTOM PROCEDURE TRAY) ×2 IMPLANT
PAD CAST 3X4 CTTN HI CHSV (CAST SUPPLIES) ×2 IMPLANT
PADDING CAST ABS COTTON 3X4 (CAST SUPPLIES) IMPLANT
PADDING CAST ABS COTTON 4X4 ST (CAST SUPPLIES) ×2 IMPLANT
PADDING CAST COTTON 3X4 STRL (CAST SUPPLIES) ×1
SHEET MEDIUM DRAPE 40X70 STRL (DRAPES) ×2 IMPLANT
SLEEVE SCD COMPRESS KNEE MED (STOCKING) ×2 IMPLANT
SLING ARM FOAM STRAP LRG (SOFTGOODS) IMPLANT
SPIKE FLUID TRANSFER (MISCELLANEOUS) IMPLANT
SPLINT FIBERGLASS 3X35 (CAST SUPPLIES) IMPLANT
STOCKINETTE 4X48 STRL (DRAPES) ×2 IMPLANT
SUCTION TUBE FRAZIER 10FR DISP (SUCTIONS) IMPLANT
SUT ETHIBOND 0 MO6 C/R (SUTURE) IMPLANT
SUT ETHILON 4 0 PS 2 18 (SUTURE) ×2 IMPLANT
SUT FIBERWIRE #2 38 T-5 BLUE (SUTURE)
SUT FIBERWIRE 2-0 18 17.9 3/8 (SUTURE)
SUT MNCRL AB 4-0 PS2 18 (SUTURE) IMPLANT
SUT VIC AB 2-0 CT1 27 (SUTURE)
SUT VIC AB 2-0 CT1 TAPERPNT 27 (SUTURE) IMPLANT
SUT VIC AB 2-0 SH 27 (SUTURE) ×1
SUT VIC AB 2-0 SH 27XBRD (SUTURE) ×2 IMPLANT
SUT VIC AB 3-0 SH 27 (SUTURE) ×1
SUT VIC AB 3-0 SH 27X BRD (SUTURE) IMPLANT
SUT VICRYL 0 SH 27 (SUTURE) IMPLANT
SUTURE FIBERWR #2 38 T-5 BLUE (SUTURE) IMPLANT
SUTURE FIBERWR 2-0 18 17.9 3/8 (SUTURE) IMPLANT
SUTURE TAPE 1.3 FIBERLOP 20 ST (SUTURE) IMPLANT
SUTURETAPE 1.3 FIBERLOOP 20 ST (SUTURE)
SYR BULB EAR ULCER 3OZ GRN STR (SYRINGE) ×2 IMPLANT
SYR CONTROL 10ML LL (SYRINGE) IMPLANT
TOWEL GREEN STERILE FF (TOWEL DISPOSABLE) ×4 IMPLANT
TRAY DSU PREP LF (CUSTOM PROCEDURE TRAY) ×2 IMPLANT
TUBE CONNECTING 20X1/4 (TUBING) ×2 IMPLANT
UNDERPAD 30X36 HEAVY ABSORB (UNDERPADS AND DIAPERS) ×2 IMPLANT

## 2022-10-25 NOTE — H&P (Signed)
PREOPERATIVE H&P  Chief Complaint: left thumb carpometacarpal osteoarthritis  HPI: [name] is a 67 y.o. female who presents for surgical treatment of left thumb carpometacarpal osteoarthritis.  She denies any changes in medical history.  Past Medical History:  Diagnosis Date   Arthritis    "fingers occasionally" (08/26/2014)   Asthmatic bronchitis    "haven't used an inhaler in years" (08/26/2014)   Cervical cancer (HCC)    Counseling for estrogen replacement therapy 12/30/2014   Depression    Diabetes mellitus without complication (HCC)    type- 2   Elevated cholesterol    Exertional shortness of breath    GERD (gastroesophageal reflux disease)    Hot flashes 07/05/2012   Hypertension    Hypothyroidism    IFG (impaired fasting glucose)    Mixed stress and urge urinary incontinence    Moody 12/30/2014   Obesity    Panniculus    PONV (postoperative nausea and vomiting)    Rectocele 07/05/2012   Stress incontinence    Superficial fungus infection of skin    Past Surgical History:  Procedure Laterality Date   ABDOMINAL HYSTERECTOMY  1986   "partial" Uterine cancer   ANKLE FUSION Left 10/24/2013   Procedure: LEFT ANKLE SUBTALAR AND TALONAVICULAR FUSION;  Surgeon: Nadara Mustard, MD;  [facility]: [facility];  Service: Orthopedics;  Laterality: Left;   ANKLE FUSION Left 08/26/2014   Procedure: Left Foot Take Down Non-union with Revision Talonavicular and Subtalar Fusion;  Surgeon: Nadara Mustard, MD;  Location: [facility] OR;  Service: Orthopedics;  Laterality: Left;   BACK SURGERY     BILATERAL SALPINGECTOMY  2010   w/LOA   COLONOSCOPY     COLONOSCOPY N/A 07/14/2016   Procedure: COLONOSCOPY;  Surgeon: West Bali, MD;  [facility];  Service: Endoscopy;  Laterality: N/A;  8:30   FUSION OF TALONAVICULAR JOINT Left    Take Down Non-union with Revision Talonavicular and Subtalar Fusion /notes 08/26/2014   HAMMER TOE SURGERY Bilateral 2000-2013   right-left   JOINT REPLACEMENT      Right knee   KNEE ARTHROSCOPY Bilateral 2008-2010    left-right   LUMBAR DISC SURGERY  2004   Lumbar 4- 5   POLYPECTOMY  07/14/2016   Procedure: POLYPECTOMY;  Surgeon: West Bali, MD;  [facility];  Service: Endoscopy;;  hepatic flexure   SHOULDER ARTHROSCOPY WITH SUBACROMIAL DECOMPRESSION, ROTATOR CUFF REPAIR AND BICEP TENDON REPAIR Right 02/11/2019   Procedure: right shoulde arthroscopy, biceps tenodesis lower trapezius tendon transfer;  Surgeon: Cammy Copa, MD;  Location: [facility] OR;  Service: Orthopedics;  Laterality: Right;   TOTAL KNEE ARTHROPLASTY Right 2013   TUBAL LIGATION     Social History   Socioeconomic History   Marital status: Married    Spouse name: Not on file   Number of children: Not on file   Years of education: Not on file   Highest education level: Not on file  Occupational History   Not on file  Tobacco Use   Smoking status: Former    Current packs/day: 0.00    Average packs/day: 0.5 packs/day for 10.0 years (5.0 ttl pk-yrs)    Types: Cigarettes    Start date: 03/27/1978    Quit date: 03/27/1988    Years since quitting: 34.6   Smokeless tobacco: Never  Vaping Use   Vaping status: Never Used  Substance and Sexual Activity   Alcohol use: Yes    Comment: occasionally a  beer   Drug use: No   Sexual activity: Not Currently    Birth control/protection: Surgical    Comment: hyst  Other Topics Concern   Not on file  Social History Narrative   Married for 45 years.Lives with husband.On disability for foot problems.Previously worked for [employer].      Per [facility] New Patient Packet Abstracted on 11/30/2020:      Diet: Left blank      Caffeine: Sometimes      Married, if yes what year: Yes, 1976      Do you live in a house, apartment, assisted living, condo, trailer, ect: House      Is it one or more stories: Left Blank      How many persons live in your home? Left Blank      Pets: Yes, 2      Highest level or education completed: Left  Blank      Current/Past profession:Left Blank      Exercise:                  Type and how often:          Living Will: No   DNR: No   POA/HPOA: No      Functional Status:   Do you have difficulty bathing or dressing yourself?No   Do you have difficulty preparing food or eating?No   Do you have difficulty managing your medications?No   Do you have difficulty managing your finances?No   Do you have difficulty affording your medications? No   Social Determinants of Corporate investment banker Strain: Not on file  Food Insecurity: Not on file  Transportation Needs: Not on file  Physical Activity: Not on file  Stress: Not on file  Social Connections: Not on file   Family History  Problem Relation Age of Onset   Heart disease Mother    Stroke Mother    Heart disease Father    Heart attack Father        MI at age 22   Diabetes Brother    Hypertension Brother    Diabetes Brother    Hypertension Brother    Other Brother        Gastrointestional disease   Cancer Maternal Aunt        cervical   Diabetes Maternal Grandmother    Diabetes Paternal Grandmother    Allergies  Allergen Reactions   Naprosyn [Naproxen] Nausea Only    Can take Alleve but years ago patient took a liquid form of this medication and it made me nauseated   Prior to Admission medications   Medication Sig Start Date End Date Taking? Authorizing Provider  acetaminophen (TYLENOL) 500 MG tablet Take 500 mg by mouth as needed.   Yes [provider]  calcium carbonate (CALCIUM 600) 600 MG TABS tablet Take 1 tablet (600 mg total) by mouth 2 (two) times daily with a meal. 12/16/21  Yes Sharon Seller, NP  Cholecalciferol (VITAMIN D3) 50 MCG (2000 UT) capsule Take 1 capsule (2,000 Units total) by mouth daily. 12/16/21  Yes Sharon Seller, NP  glucose blood (ONETOUCH ULTRA) test strip Use to check blood sugar twice daily. Dx: E11.9 03/22/21  Yes Sharon Seller, NP  HYDROcodone-acetaminophen  (NORCO) 5-325 MG tablet Take 1-2 tablets by mouth 3 (three) times daily as needed. To be taken after surgery 10/23/22   Cristie Hem, PA-C  metFORMIN (GLUCOPHAGE) 500 MG tablet Take 2 tablets by  mouth twice daily 10/09/22  Yes Sharon Seller, NP  nystatin (MYCOSTATIN/NYSTOP) powder APPLY TO AFFECTED AREA TWICE A DAY 06/27/22  Yes Adline Potter, NP  ondansetron (ZOFRAN) 4 MG tablet Take 1 tablet (4 mg total) by mouth every 8 (eight) hours as needed for nausea or vomiting. 10/23/22   Cristie Hem, PA-C  rosuvastatin (CRESTOR) 20 MG tablet TAKE 1 TABLET BY MOUTH EVERY DAY 12/05/21  Yes Sharon Seller, NP  telmisartan-hydrochlorothiazide (MICARDIS HCT) 40-12.5 MG tablet TAKE 1 TABLET BY MOUTH EVERY DAY 10/02/22  Yes Sharon Seller, NP  thyroid (ARMOUR) 60 MG tablet Take 1 tablet (60 mg total) by mouth daily before breakfast. 12/22/21  Yes Sharon Seller, NP  tirzepatide [facility]) 5 MG/0.5ML Pen Inject 5 mg into the skin once a week. 05/22/22  Yes Sharon Seller, NP  OneTouch Delica Lancets 33G MISC Use to check blood sugar twice daily. Dx: E11.9 03/22/21   Sharon Seller, NP     Positive ROS: All other systems have been reviewed and were otherwise negative with the exception of those mentioned in the HPI and as above.  Physical Exam: General: Alert, no acute distress Cardiovascular: No pedal edema Respiratory: No cyanosis, no use of accessory musculature GI: abdomen soft Skin: No lesions in the area of chief complaint Neurologic: Sensation intact distally Psychiatric: Patient is competent for consent with normal mood and affect Lymphatic: no lymphedema  MUSCULOSKELETAL: exam stable  Assessment: left thumb carpometacarpal osteoarthritis  Plan: Plan for Procedure(s): LEFT THUMB CARPOMETACARPAL  ARTHROSPLASTY  The risks benefits and alternatives were discussed with the patient including but not limited to the risks of nonoperative treatment, versus surgical  intervention including infection, bleeding, nerve injury,  blood clots, cardiopulmonary complications, morbidity, mortality, among others, and they were willing to proceed.   Glee Arvin, MD 10/25/2022 10:48 AM

## 2022-10-25 NOTE — Anesthesia Postprocedure Evaluation (Signed)
Anesthesia Post Note  Patient: [name]  Procedure(s) Performed: LEFT THUMB CARPOMETACARPAL  ARTHROSPLASTY (Left: Thumb)     Patient location during evaluation: PACU Anesthesia Type: Regional Level of consciousness: awake Pain management: pain level controlled Vital Signs Assessment: post-procedure vital signs reviewed and stable Respiratory status: spontaneous breathing, nonlabored ventilation and respiratory function stable Cardiovascular status: blood pressure returned to baseline and stable Postop Assessment: no apparent nausea or vomiting Anesthetic complications: no   No notable events documented.  Last Vitals:  Vitals:   10/25/22 1445 10/25/22 1459  BP: 126/71 131/86  Pulse: 77 76  Resp: 20 18  Temp:  (!) 36.3 C  SpO2: 94% 97%    Last Pain:  Vitals:   10/25/22 1459  TempSrc:   PainSc: 0-No pain                 Jaythan Hinely P Gumecindo Hopkin

## 2022-10-25 NOTE — Transfer of Care (Signed)
Immediate Anesthesia Transfer of Care Note  Patient: [name]  Procedure(s) Performed: LEFT THUMB CARPOMETACARPAL  ARTHROSPLASTY (Left: Thumb)  Patient Location: PACU  Anesthesia Type:MAC combined with regional for post-op pain  Level of Consciousness: awake, alert , and oriented  Airway & Oxygen Therapy: Patient Spontanous Breathing and Patient connected to face mask oxygen  Post-op Assessment: Report given to RN and Post -op Vital signs reviewed and stable  Post vital signs: Reviewed and stable  Last Vitals:  Vitals Value Taken Time  BP 110/57 10/25/22 1424  Temp    Pulse 71 10/25/22 1426  Resp 16 10/25/22 1426  SpO2 98 % 10/25/22 1426  Vitals shown include unfiled device data.  Last Pain:  Vitals:   10/25/22 1108  TempSrc: Oral  PainSc: 0-No pain         Complications: No notable events documented.

## 2022-10-25 NOTE — Anesthesia Procedure Notes (Signed)
Anesthesia Regional Block: Supraclavicular block   Pre-Anesthetic Checklist: , timeout performed,  Correct Patient, Correct Site, Correct Laterality,  Correct Procedure, Correct Position, site marked,  Risks and benefits discussed,  Surgical consent,  Pre-op evaluation,  At surgeon's request and post-op pain management  Laterality: Left  Prep: chloraprep       Needles:  Injection technique: Single-shot  Needle Type: Echogenic Stimulator Needle     Needle Length: 9cm  Needle Gauge: 21     Additional Needles:   Procedures:,,,, ultrasound used (permanent image in chart),,    Narrative:  Start time: 10/25/2022 12:40 PM End time: 10/25/2022 12:50 PM Injection made incrementally with aspirations every 5 mL.  Performed by: Personally  Anesthesiologist: Leonides Grills, MD  Additional Notes: Functioning IV was confirmed and monitors were applied.  A timeout was performed. Sterile prep, hand hygiene and sterile gloves were used. A 90mm 21ga Arrow echogenic stimulator needle was used. Negative aspiration and negative test dose prior to incremental administration of local anesthetic. The patient tolerated the procedure well.  Ultrasound guidance: relevent anatomy identified, needle position confirmed, local anesthetic spread visualized around nerve(s), vascular puncture avoided.  Image printed for medical record.

## 2022-10-25 NOTE — Op Note (Signed)
   DATE OF SURGERY:10/25/2022  PREOPERATIVE DIAGNOSIS: Left basal joint arthritis.  POSTOPERATIVE DIAGNOSIS: same.  PROCEDURE:  1. Left thumb carpometacarpal interposition arthroplasty ([id number]); thumb    IMPLANTS: Arthrex  SURGEON: N. Glee Arvin, MD  ASSIST: Oneal Grout, PA-C; necessary for the timely completion of procedure and due to complexity of procedure.  ANESTHESIA: regional, MAC  TOURNIQUET TIME: see anesthesia record.  BLOOD LOSS: Minimal.  COMPLICATIONS: None.  PATHOLOGY: None.  TIME OUT: A time out was performed before the procedure started.  INDICATIONS FOR PROCEDURE: The patient presents today for the above mentioned procedure after failing extensive conservative measures.  The risks, benefits, and alternatives to surgery were discussed and the patient elected to proceed with surgery.  DESCRIPTION OF PROCEDURE: A Wagner's incision was made. The superficial radial nerve was identified and protected. Radial artery was exposed and protected. We dissected sharply in between the extensor pollicis brevis and abductor pollicis longus tendon interval. Capsulotomy was performed. Capsular flaps were raised. Fluoroscopy was used to identify the borders of the trapezium.  Trapezium was exposed and removed in piecemeal fashion with osteotomes and rongeur. Using fluoroscopic guidance, a pin was advanced from the radial base of the second metacarpal out the ulnar cortex.  A drill guide was then screwed into the radial cortex.  The pin was removed and a drill was advanced across the metacarpal and out of the ulnar cortex.  The microlink device was then inserted through the bone tunnel and deployed on the far cortex.  Another pin was then drilled into the base of the thumb metacarpal under fluoroscopic guidance.  A cannulated drill was advanced over the pin to the proper depth.  Everything was removed and a swivel lock was placed to secure the suture tails.  Radiographic  landmarks were used to assess proper placement and tension of the device.  Thumb range of motion was assessed through abduction, palmar abduction and opposition to make sure the device was not overtensioned.   The wound was thoroughly irrigated.  Then tourniquet was deflated. Hemostasis achieved. Wounds were irrigated and closed using 4-0 nylon sutures. Sterile dressing applied, hand immobilized in a thumb spica splint. The patient then was transferred to the recovery room in stable condition after all counts were correct.  POSTOPERATIVE PLAN: Return in two weeks for suture removal and application of a thumb spica cast. Four weeks after surgery cast will be removed and switch to a thumb spica brace and start hand therapy.

## 2022-10-25 NOTE — Progress Notes (Signed)
Assisted Dr. Roanna Banning with right, supraclavicular, ultrasound guided block. Side rails up, monitors on throughout procedure. See vital signs in flow sheet. Tolerated Procedure well.

## 2022-10-25 NOTE — Anesthesia Preprocedure Evaluation (Addendum)
Anesthesia Evaluation  Patient identified by MRN, date of birth, ID band Patient awake    Reviewed: Allergy & Precautions, NPO status , Patient's Chart, lab work & pertinent test results  Airway Mallampati: II  TM Distance: >3 FB Neck ROM: Full    Dental no notable dental hx.    Pulmonary former smoker   Pulmonary exam normal        Cardiovascular hypertension, Pt. on medications Normal cardiovascular exam     Neuro/Psych  PSYCHIATRIC DISORDERS  Depression    negative neurological ROS     GI/Hepatic negative GI ROS, Neg liver ROS,,,  Endo/Other  diabetes, Oral Hypoglycemic AgentsHypothyroidism    Renal/GU negative Renal ROS     Musculoskeletal  (+) Arthritis ,    Abdominal  (+) + obese  Peds  Hematology negative hematology ROS (+)   Anesthesia Other Findings Left thumb carpometacarpal osteoarthritis  Reproductive/Obstetrics                             Anesthesia Physical Anesthesia Plan  ASA: 3  Anesthesia Plan: Regional   Post-op Pain Management:    Induction: Intravenous  PONV Risk Score and Plan: 2 and Ondansetron, Dexamethasone, Propofol infusion, Midazolam and Treatment may vary due to age or medical condition  Airway Management Planned: Simple Face Mask  Additional Equipment:   Intra-op Plan:   Post-operative Plan:   Informed Consent: I have reviewed the patients History and Physical, chart, labs and discussed the procedure including the risks, benefits and alternatives for the proposed anesthesia with the patient or authorized representative who has indicated his/her understanding and acceptance.     Dental advisory given  Plan Discussed with: CRNA  Anesthesia Plan Comments:        Anesthesia Quick Evaluation

## 2022-10-25 NOTE — Discharge Instructions (Addendum)
Postoperative instructions:  Weightbearing instructions: non weight bearing  Dressing instructions: Keep your dressing and/or splint clean and dry at all times.  It will be removed at your first post-operative appointment.  Your stitches and/or staples will be removed at this visit.  Incision instructions:  Do not soak your incision for 3 weeks after surgery.  If the incision gets wet, pat dry and do not scrub the incision.  Pain control:  You have been given a prescription to be taken as directed for post-operative pain control.  In addition, elevate the operative extremity above the heart at all times to prevent swelling and throbbing pain.  Take over-the-counter Colace, 100mg  by mouth twice a day while taking narcotic pain medications to help prevent constipation.  Follow up appointments: 1) 14 days for suture removal and wound check. 2) Dr. Roda Shutters or Tessa Lerner, PA as scheduled.   -------------------------------------------------------------------------------------------------------------  After Surgery Pain Control:  After your surgery, post-surgical discomfort or pain is likely. This discomfort can last several days to a few weeks. At certain times of the day your discomfort may be more intense.  Did you receive a nerve block?  A nerve block can provide pain relief for one hour to two days after your surgery. As long as the nerve block is working, you will experience little or no sensation in the area the surgeon operated on.  As the nerve block wears off, you will begin to experience pain or discomfort. It is very important that you begin taking your prescribed pain medication before the nerve block fully wears off. Treating your pain at the first sign of the block wearing off will ensure your pain is better controlled and more tolerable when full-sensation returns. Do not wait until the pain is intolerable, as the medicine will be less effective. It is better to treat pain in  advance than to try and catch up.  General Anesthesia:  If you did not receive a nerve block during your surgery, you will need to start taking your pain medication shortly after your surgery and should continue to do so as prescribed by your surgeon.  Pain Medication:  Most commonly we prescribe Vicodin and Percocet for post-operative pain. Both of these medications contain a combination of acetaminophen (Tylenol) and a narcotic to help control pain.   It takes between 30 and 45 minutes before pain medication starts to work. It is important to take your medication before your pain level gets too intense.   Nausea is a common side effect of many pain medications. You will want to eat something before taking your pain medicine to help prevent nausea.   If you are taking a prescription pain medication that contains acetaminophen, we recommend that you do not take additional over the counter acetaminophen (Tylenol).  Other pain relieving options:   Using a cold pack to ice the affected area a few times a day (15 to 20 minutes at a time) can help to relieve pain, reduce swelling and bruising.   Elevation of the affected area can also help to reduce pain and swelling.    Post Anesthesia Home Care Instructions  Activity: Get plenty of rest for the remainder of the day. A responsible individual must stay with you for 24 hours following the procedure.  For the next 24 hours, DO NOT: -Drive a car -Advertising copywriter -Drink alcoholic beverages -Take any medication unless instructed by your physician -Make any legal decisions or sign important papers.  Meals: Start with liquid  foods such as gelatin or soup. Progress to regular foods as tolerated. Avoid greasy, spicy, heavy foods. If nausea and/or vomiting occur, drink only clear liquids until the nausea and/or vomiting subsides. Call your physician if vomiting continues.  Special Instructions/Symptoms: Your throat may feel dry or sore from  the anesthesia or the breathing tube placed in your throat during surgery. If this causes discomfort, gargle with warm salt water. The discomfort should disappear within 24 hours.  If you had a scopolamine patch placed behind your ear for the management of post- operative nausea and/or vomiting:  1. The medication in the patch is effective for 72 hours, after which it should be removed.  Wrap patch in a tissue and discard in the trash. Wash hands thoroughly with soap and water. 2. You may remove the patch earlier than 72 hours if you experience unpleasant side effects which may include dry mouth, dizziness or visual disturbances. 3. Avoid touching the patch. Wash your hands with soap and water after contact with the patch.    Regional Anesthesia Blocks  1. Numbness or the inability to move the "blocked" extremity may last from 3-48 hours after placement. The length of time depends on the medication injected and your individual response to the medication. If the numbness is not going away after 48 hours, call your surgeon.  2. The extremity that is blocked will need to be protected until the numbness is gone and the  Strength has returned. Because you cannot feel it, you will need to take extra care to avoid injury. Because it may be weak, you may have difficulty moving it or using it. You may not know what position it is in without looking at it while the block is in effect.  3. For blocks in the legs and feet, returning to weight bearing and walking needs to be done carefully. You will need to wait until the numbness is entirely gone and the strength has returned. You should be able to move your leg and foot normally before you try and bear weight or walk. You will need someone to be with you when you first try to ensure you do not fall and possibly risk injury.  4. Bruising and tenderness at the needle site are common side effects and will resolve in a few days.  5. Persistent numbness or new  problems with movement should be communicated to the surgeon or the [facility] [phone] [facility] [phone]).

## 2022-10-26 ENCOUNTER — Telehealth: Payer: Self-pay | Admitting: Orthopaedic Surgery

## 2022-10-26 ENCOUNTER — Encounter (HOSPITAL_BASED_OUTPATIENT_CLINIC_OR_DEPARTMENT_OTHER): Payer: Self-pay | Admitting: Orthopaedic Surgery

## 2022-10-26 ENCOUNTER — Other Ambulatory Visit: Payer: Self-pay | Admitting: Physician Assistant

## 2022-10-26 MED ORDER — HYDROCODONE-ACETAMINOPHEN 5-325 MG PO TABS: 1.0000 | ORAL_TABLET | Freq: Three times a day (TID) | ORAL | 0 refills | Status: DC | PRN

## 2022-10-26 MED ORDER — ONDANSETRON HCL 4 MG PO TABS: 4.0000 mg | ORAL_TABLET | Freq: Three times a day (TID) | ORAL | 0 refills | Status: DC | PRN

## 2022-10-26 NOTE — Telephone Encounter (Signed)
Patient called needing something called into her pharmacy for pain.  Patient uses [facility] on [address] [place] [address]   The number to contact patient is [phone]

## 2022-10-26 NOTE — Telephone Encounter (Signed)
sent 

## 2022-10-26 NOTE — Telephone Encounter (Signed)
Notified patient.

## 2022-11-06 ENCOUNTER — Encounter: Payer: Self-pay | Admitting: Nurse Practitioner

## 2022-11-06 ENCOUNTER — Ambulatory Visit: Payer: Medicare HMO | Admitting: Nurse Practitioner

## 2022-11-06 VITALS — BP 136/82 | HR 70 | Temp 97.9°F | Ht 62.5 in | Wt 230.0 lb

## 2022-11-06 DIAGNOSIS — Z0000 Encounter for general adult medical examination without abnormal findings: Secondary | ICD-10-CM | POA: Diagnosis not present

## 2022-11-06 DIAGNOSIS — E1169 Type 2 diabetes mellitus with other specified complication: Secondary | ICD-10-CM

## 2022-11-06 MED ORDER — SEMAGLUTIDE(0.25 OR 0.5MG/DOS) 2 MG/3ML ~~LOC~~ SOPN
0.2500 mg | PEN_INJECTOR | SUBCUTANEOUS | 2 refills | Status: DC
Start: 2022-11-06 — End: 2023-03-30

## 2022-11-06 NOTE — Patient Instructions (Addendum)
  [name] , Thank you for taking time to come for your Medicare Wellness Visit. I appreciate your ongoing commitment to your health goals. Please review the following plan we discussed and let me know if I can assist you in the future.   These are the goals we discussed:  Goals      Weight (lb) < 180 lb (81.6 kg)        This is a list of the screening recommended for you and due dates:  Health Maintenance  Topic Date Due   Zoster (Shingles) Vaccine (1 of 2) Never done   COVID-19 Vaccine (3 - 2023-24 season) 11/25/2021   Flu Shot  10/26/2022   Mammogram  12/16/2022   Hemoglobin A1C  03/24/2023   Yearly kidney health urinalysis for diabetes  05/10/2023   Eye exam for diabetics  05/12/2023   Yearly kidney function blood test for diabetes  10/20/2023   Complete foot exam   11/06/2023   Medicare Annual Wellness Visit  11/06/2023   Colon Cancer Screening  07/15/2026   DTaP/Tdap/Td vaccine (2 - Td or Tdap) 09/05/2026   DEXA scan (bone density measurement)  12/16/2026   Pneumonia Vaccine  Completed   Hepatitis C Screening  Completed   HPV Vaccine  Aged Out

## 2022-11-06 NOTE — Progress Notes (Signed)
Subjective:   [name] is a 67 y.o. female who presents for Medicare Annual (Subsequent) preventive examination.  Visit Complete: In person at [facility]   Review of Systems    Cardiac Risk Factors include: advanced age (>84men, >4 women);hypertension;diabetes mellitus;dyslipidemia;obesity (BMI >30kg/m2);sedentary lifestyle     Objective:    Today's Vitals   11/06/22 0957 11/06/22 1015  BP: 136/82   Pulse: 70   Temp: 97.9 F (36.6 C)   TempSrc: Temporal   SpO2: 98%   Weight: 230 lb (104.3 kg)   Height: 5' 2.5" (1.588 m)   PainSc:  1    Body mass index is 41.4 kg/m.     11/06/2022    9:54 AM 11/06/2022    9:51 AM 10/25/2022   11:14 AM 10/18/2022    1:21 PM 05/22/2022    3:19 PM 01/06/2022   10:46 AM 11/01/2021    8:27 AM  Advanced Directives  Does Patient Have a Medical Advance Directive? No No No No No No No  Would patient like information on creating a medical advance directive? No - Patient declined No - Patient declined No - Patient declined No - Patient declined No - Patient declined No - Patient declined No - Patient declined    Current Medications (verified) Outpatient Encounter Medications as of 11/06/2022  Medication Sig   acetaminophen (TYLENOL) 500 MG tablet Take 500 mg by mouth as needed.   calcium carbonate (CALCIUM 600) 600 MG TABS tablet Take 1 tablet (600 mg total) by mouth 2 (two) times daily with a meal.   Cholecalciferol (VITAMIN D3) 50 MCG (2000 UT) capsule Take 1 capsule (2,000 Units total) by mouth daily.   glucose blood (ONETOUCH ULTRA) test strip Use to check blood sugar twice daily. Dx: E11.9   HYDROcodone-acetaminophen (NORCO) 5-325 MG tablet Take 1 tablet by mouth 3 (three) times daily as needed.   metFORMIN (GLUCOPHAGE) 500 MG tablet Take 2 tablets by mouth twice daily   nystatin (MYCOSTATIN/NYSTOP) powder APPLY TO AFFECTED AREA TWICE A DAY   OneTouch Delica Lancets 33G MISC Use to check blood sugar twice daily. Dx: E11.9   rosuvastatin  (CRESTOR) 20 MG tablet TAKE 1 TABLET BY MOUTH EVERY DAY   telmisartan-hydrochlorothiazide (MICARDIS HCT) 40-12.5 MG tablet TAKE 1 TABLET BY MOUTH EVERY DAY   thyroid (ARMOUR) 60 MG tablet Take 1 tablet (60 mg total) by mouth daily before breakfast.   tirzepatide (MOUNJARO) 5 MG/0.5ML Pen Inject 5 mg into the skin once a week.   [DISCONTINUED] ondansetron (ZOFRAN) 4 MG tablet Take 1 tablet (4 mg total) by mouth every 8 (eight) hours as needed for nausea or vomiting.   [DISCONTINUED] ondansetron (ZOFRAN) 4 MG tablet Take 1 tablet (4 mg total) by mouth every 8 (eight) hours as needed for nausea or vomiting.   No facility-administered encounter medications on file as of 11/06/2022.    Allergies (verified) Naprosyn [naproxen]   History: Past Medical History:  Diagnosis Date   Arthritis    "fingers occasionally" (08/26/2014)   Asthmatic bronchitis    "haven't used an inhaler in years" (08/26/2014)   Cervical cancer (HCC)    Counseling for estrogen replacement therapy 12/30/2014   Depression    Diabetes mellitus without complication (HCC)    type- 2   Elevated cholesterol    Exertional shortness of breath    GERD (gastroesophageal reflux disease)    Hot flashes 07/05/2012   Hypertension    Hypothyroidism    IFG (impaired fasting glucose)  Mixed stress and urge urinary incontinence    Moody 12/30/2014   Obesity    Panniculus    PONV (postoperative nausea and vomiting)    Rectocele 07/05/2012   Stress incontinence    Superficial fungus infection of skin    Past Surgical History:  Procedure Laterality Date   ABDOMINAL HYSTERECTOMY  1986   "partial" Uterine cancer   ANKLE FUSION Left 10/24/2013   Procedure: LEFT ANKLE SUBTALAR AND TALONAVICULAR FUSION;  Surgeon: Nadara Mustard, MD;  Location: [facility] OR;  Service: Orthopedics;  Laterality: Left;   ANKLE FUSION Left 08/26/2014   Procedure: Left Foot Take Down Non-union with Revision Talonavicular and Subtalar Fusion;  Surgeon: Nadara Mustard,  MD;  Location: [facility] OR;  Service: Orthopedics;  Laterality: Left;   BACK SURGERY     BILATERAL SALPINGECTOMY  2010   w/LOA   CARPOMETACARPEL SUSPENSION PLASTY Left 10/25/2022   Procedure: LEFT THUMB CARPOMETACARPAL  ARTHROSPLASTY;  Surgeon: Tarry Kos, MD;  Location: [facility];  Service: Orthopedics;  Laterality: Left;   COLONOSCOPY     COLONOSCOPY N/A 07/14/2016   Procedure: COLONOSCOPY;  Surgeon: West Bali, MD;  [facility]: [facility];  Service: Endoscopy;  Laterality: N/A;  8:30   FUSION OF TALONAVICULAR JOINT Left    Take Down Non-union with Revision Talonavicular and Subtalar Fusion /notes 08/26/2014   HAMMER TOE SURGERY Bilateral 2000-2013   right-left   JOINT REPLACEMENT     Right knee   KNEE ARTHROSCOPY Bilateral 2008-2010    left-right   LUMBAR DISC SURGERY  2004   Lumbar 4- 5   POLYPECTOMY  07/14/2016   Procedure: POLYPECTOMY;  Surgeon: West Bali, MD;  Location: [facility];  Service: Endoscopy;;  hepatic flexure   SHOULDER ARTHROSCOPY WITH SUBACROMIAL DECOMPRESSION, ROTATOR CUFF REPAIR AND BICEP TENDON REPAIR Right 02/11/2019   Procedure: right shoulde arthroscopy, biceps tenodesis lower trapezius tendon transfer;  Surgeon: Cammy Copa, MD;  Location: [facility] OR;  Service: Orthopedics;  Laterality: Right;   TOTAL KNEE ARTHROPLASTY Right 2013   TUBAL LIGATION     Family History  Problem Relation Age of Onset   Heart disease Mother    Stroke Mother    Heart disease Father    Heart attack Father        MI at age 24   Diabetes Brother    Hypertension Brother    Diabetes Brother    Hypertension Brother    Other Brother        Gastrointestional disease   Cancer Maternal Aunt        cervical   Diabetes Maternal Grandmother    Diabetes Paternal Grandmother    Social History   Socioeconomic History   Marital status: Married    Spouse name: Not on file   Number of children: Not on file   Years of education: Not on file   Highest  education level: Not on file  Occupational History   Not on file  Tobacco Use   Smoking status: Former    Current packs/day: 0.00    Average packs/day: 0.5 packs/day for 10.0 years (5.0 ttl pk-yrs)    Types: Cigarettes    Start date: 03/27/1978    Quit date: 03/27/1988    Years since quitting: 34.6   Smokeless tobacco: Never  Vaping Use   Vaping status: Never Used  Substance and Sexual Activity   Alcohol use: Yes    Comment: occasionally a beer   Drug use:  No   Sexual activity: Not Currently    Birth control/protection: Surgical    Comment: hyst  Other Topics Concern   Not on file  Social History Narrative   Married for 45 years.Lives with husband.On disability for foot problems.Previously worked for [employer].      Per [facility] New Patient Packet Abstracted on 11/30/2020:      Diet: Left blank      Caffeine: Sometimes      Married, if yes what year: Yes, 1976      Do you live in a house, apartment, assisted living, condo, trailer, ect: House      Is it one or more stories: Left Blank      How many persons live in your home? Left Blank      Pets: Yes, 2      Highest level or education completed: Left Blank      Current/Past profession:Left Blank      Exercise:                  Type and how often:          Living Will: No   DNR: No   POA/HPOA: No      Functional Status:   Do you have difficulty bathing or dressing yourself?No   Do you have difficulty preparing food or eating?No   Do you have difficulty managing your medications?No   Do you have difficulty managing your finances?No   Do you have difficulty affording your medications? No   Social Determinants of Corporate investment banker Strain: Not on file  Food Insecurity: Not on file  Transportation Needs: Not on file  Physical Activity: Not on file  Stress: Not on file  Social Connections: Not on file    Tobacco Counseling Counseling given: Not Answered   Clinical Intake:  Pre-visit preparation completed:  Yes  Pain : 0-10 Pain Score: 1  Pain Location: Hand Pain Orientation: Left Pain Descriptors / Indicators: Throbbing     BMI - recorded: 41 Nutritional Status: BMI > 30  Obese Nutritional Risks: None Diabetes: Yes  How often do you need to have someone help you when you read instructions, pamphlets, or other written materials from your doctor or pharmacy?: 1 - Never         Activities of Daily Living    11/06/2022   10:13 AM 10/25/2022   10:54 AM  In your present state of health, do you have any difficulty performing the following activities:  Hearing? 0 0  Vision? 0 0  Difficulty concentrating or making decisions? 0 0  Walking or climbing stairs? 0 0  Dressing or bathing? 0 0  Doing errands, shopping? 0   Preparing Food and eating ? N   Using the Toilet? N   In the past six months, have you accidently leaked urine? Y   Do you have problems with loss of bowel control? N   Managing your Medications? N   Managing your Finances? N   Housekeeping or managing your Housekeeping? N     Patient Care Team: Sharon Seller, NP as PCP - General (Geriatric Medicine) Wyline Mood Dorothe Pea, MD as PCP - Cardiology (Cardiology) Daisy Lazar, DO (Optometry)  Indicate any recent Medical Services you may have received from other than [facility] providers in the past year (date may be approximate).     Assessment:   This is a routine wellness examination for [place].  Hearing/Vision screen Hearing Screening - Comments:: No  hearing issues  Vision Screening - Comments:: Last eye exam less than 12 months ago with Dr. Charise Killian  Dietary issues and exercise activities discussed:     Goals Addressed   None    Depression Screen    11/06/2022    9:51 AM 09/22/2022    3:03 PM 11/01/2021    8:26 AM 03/07/2021   11:37 AM 12/01/2020    1:34 PM 08/04/2020    9:42 AM 11/28/2018    4:08 PM  PHQ 2/9 Scores  PHQ - 2 Score 0 0 0 0 0 0 0  PHQ- 9 Score      0     Fall Risk    11/06/2022    9:51 AM  09/22/2022    2:58 PM 05/22/2022    3:19 PM 01/06/2022   10:45 AM 11/01/2021    8:26 AM  Fall Risk   Falls in the past year? 0 0 0 0 0  Number falls in past yr: 0 0 0 0 0  Injury with Fall? 0 0 0 0 0  Risk for fall due to : No Fall Risks  No Fall Risks No Fall Risks No Fall Risks  Follow up Falls evaluation completed  Falls evaluation completed Falls evaluation completed Falls evaluation completed    MEDICARE RISK AT HOME:   TIMED UP AND GO:  Was the test performed?  No    Cognitive Function:    11/06/2022    9:59 AM  MMSE - Mini Mental State Exam  Orientation to time 5  Orientation to Place 5  Registration 3  Attention/ Calculation 5  Recall 2  Language- name 2 objects 2  Language- repeat 1  Language- follow 3 step command 3  Language- read & follow direction 1  Write a sentence 1  Copy design 1  Total score 29        11/01/2021    8:58 AM  6CIT Screen  What Year? 0 points  What month? 0 points  What time? 0 points  Count back from 20 0 points  Months in reverse 0 points  Repeat phrase 0 points  Total Score 0 points    Immunizations Immunization History  Administered Date(s) Administered   Fluad Quad(high Dose 65+) 01/06/2022   Influenza Inj Mdck Quad Pf 01/06/2018   Influenza,inj,Quad PF,6+ Mos 01/12/2015, 12/03/2018, 12/25/2020   Influenza-Unspecified 01/08/2014, 01/16/2016, 01/06/2018   Moderna Sars-Covid-2 Vaccination 01/08/2020, 02/05/2020   PNEUMOCOCCAL CONJUGATE-20 06/10/2021   Pneumococcal Polysaccharide-23 01/13/2014   Tdap 09/04/2016    TDAP status: Up to date  Flu Vaccine status: Due, Education has been provided regarding the importance of this vaccine. Advised may receive this vaccine at local pharmacy or Health Dept. Aware to provide a copy of the vaccination record if obtained from local pharmacy or Health Dept. Verbalized acceptance and understanding.  Pneumococcal vaccine status: Up to date  Covid-19 vaccine status: Information provided  on how to obtain vaccines.   Qualifies for Shingles Vaccine? Yes   Zostavax completed No   Shingrix Completed?: No.    Education has been provided regarding the importance of this vaccine. Patient has been advised to call insurance company to determine out of pocket expense if they have not yet received this vaccine. Advised may also receive vaccine at local pharmacy or Health Dept. Verbalized acceptance and understanding.  Screening Tests Health Maintenance  Topic Date Due   Zoster Vaccines- Shingrix (1 of 2) Never done   COVID-19 Vaccine (3 - 2023-24 season) 11/25/2021  INFLUENZA VACCINE  10/26/2022   MAMMOGRAM  12/16/2022   HEMOGLOBIN A1C  03/24/2023   Diabetic kidney evaluation - Urine ACR  05/10/2023   OPHTHALMOLOGY EXAM  05/12/2023   Diabetic kidney evaluation - eGFR measurement  10/20/2023   FOOT EXAM  11/06/2023   Medicare Annual Wellness (AWV)  11/06/2023   Colonoscopy  07/15/2026   DTaP/Tdap/Td (2 - Td or Tdap) 09/05/2026   DEXA SCAN  12/16/2026   Pneumonia Vaccine 26+ Years old  Completed   Hepatitis C Screening  Completed   HPV VACCINES  Aged Out    Health Maintenance  Health Maintenance Due  Topic Date Due   Zoster Vaccines- Shingrix (1 of 2) Never done   COVID-19 Vaccine (3 - 2023-24 season) 11/25/2021   INFLUENZA VACCINE  10/26/2022    Colorectal cancer screening: Type of screening: Colonoscopy. Completed 2018. Repeat every 10 years  Mammogram status: Completed 12/16/2022. Repeat every year  Bone Density status: Completed 12/15/2021. Results reflect: Bone density results: NORMAL. Repeat every 5 years.  Lung Cancer Screening: (Low Dose CT Chest recommended if Age 32-80 years, 20 pack-year currently smoking OR have quit w/in 15years.) does not qualify.   Lung Cancer Screening Referral: na  Additional Screening:  Hepatitis C Screening: does qualify; Completed   Vision Screening: Recommended annual ophthalmology exams for early detection of glaucoma and  other disorders of the eye. Is the patient up to date with their annual eye exam?  Yes  Who is the provider or what is the name of the office in which the patient attends annual eye exams? [facility] If pt is not established with a provider, would they like to be referred to a provider to establish care? No .   Dental Screening: Recommended annual dental exams for proper oral hygiene  Diabetic Foot Exam: Diabetic Foot Exam: Completed today  Community Resource Referral / Chronic Care Management: CRR required this visit?  No   CCM required this visit?  No     Plan:     I have personally reviewed and noted the following in the patient's chart:   Medical and social history Use of alcohol, tobacco or illicit drugs  Current medications and supplements including opioid prescriptions. Patient is not currently taking opioid prescriptions. Functional ability and status Nutritional status Physical activity Advanced directives List of other physicians Hospitalizations, surgeries, and ER visits in previous 12 months Vitals Screenings to include cognitive, depression, and falls Referrals and appointments  In addition, I have reviewed and discussed with patient certain preventive protocols, quality metrics, and best practice recommendations. A written personalized care plan for preventive services as well as general preventive health recommendations were provided to patient.     Sharon Seller, NP   11/06/2022

## 2022-11-09 ENCOUNTER — Encounter: Payer: Self-pay | Admitting: Physician Assistant

## 2022-11-09 ENCOUNTER — Ambulatory Visit (INDEPENDENT_AMBULATORY_CARE_PROVIDER_SITE_OTHER): Payer: Medicare HMO | Admitting: Physician Assistant

## 2022-11-09 ENCOUNTER — Other Ambulatory Visit (INDEPENDENT_AMBULATORY_CARE_PROVIDER_SITE_OTHER): Payer: Medicare HMO

## 2022-11-09 DIAGNOSIS — M1812 Unilateral primary osteoarthritis of first carpometacarpal joint, left hand: Secondary | ICD-10-CM

## 2022-11-09 NOTE — Progress Notes (Signed)
Post-Op Visit Note   Patient: [name]           Date of Birth: 1955/04/14           MRN: [id number] Visit Date: 11/09/2022 PCP: Sharon Seller, NP   Assessment & Plan:  Chief Complaint:  Chief Complaint  Patient presents with   Left Hand - Follow-up    Left thumb [facility] arthroplasty 10/26/2022   Visit Diagnoses:  1. Primary osteoarthritis of first carpometacarpal joint of left hand     Plan: Patient is a pleasant 67 year old female who comes in today 2 weeks status post left thumb [facility] arthroplasty 10/26/2022.  She has been doing well.  Minimal pain which is relieved with Tylenol.  Examination of the left hand reveals a well-healed surgical incision with nylon sutures in place.  No evidence of infection or cellulitis.  Fingers warm well-perfused.  She is neurovascular intact distally.  Today, her sutures were removed and Steri-Strips applied.  Thumb spica splint applied for which she will wear for 2 weeks.  Follow-up in 2 weeks for removal of the cast and transition to a removable thumb spica splint and referral to hand therapy for a thermoplastic splint.  Call with concerns or questions.  Follow-Up Instructions: Return in about 2 weeks (around 11/23/2022).   Orders:  Orders Placed This Encounter  Procedures   XR Hand Complete Left   No orders of the defined types were placed in this encounter.   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, midline 10/25/2020   Upper airway cough syndrome 07/30/2018   Morbid (severe) obesity due to excess calories (HCC) 07/30/2018   Depression, major, single episode, moderate (HCC) 10/17/2017   Hypothyroidism 09/19/2017   Mixed stress and urge urinary incontinence 02/10/2014   Diabetes (HCC)  01/18/2014   Hyperlipidemia LDL goal <100 03/10/2013   Hot flashes 07/05/2012   Rectocele 07/05/2012   Essential hypertension 07/04/2012   Stiffness of joint, lower leg, left 08/28/2011   Pain in left knee 08/28/2011   Past Medical History:  Diagnosis Date   Arthritis    "fingers occasionally" (08/26/2014)   Asthmatic bronchitis    "haven't used an inhaler in years" (08/26/2014)   Cervical cancer (HCC)    Counseling for estrogen replacement therapy 12/30/2014   Depression    Diabetes mellitus without complication (HCC)    type- 2   Elevated cholesterol    Exertional shortness of breath    GERD (gastroesophageal reflux disease)    Hot flashes 07/05/2012   Hypertension    Hypothyroidism    IFG (impaired fasting glucose)    Mixed stress and urge urinary incontinence    Moody 12/30/2014   Obesity    Panniculus    PONV (postoperative nausea and vomiting)    Rectocele 07/05/2012   Stress incontinence    Superficial fungus infection of skin     Family History  Problem Relation Age of Onset   Heart disease Mother    Stroke Mother    Heart disease Father    Heart attack Father        MI at age 36   Diabetes Brother    Hypertension Brother    Diabetes Brother    Hypertension Brother  Other Brother        Gastrointestional disease   Cancer Maternal Aunt        cervical   Diabetes Maternal Grandmother    Diabetes Paternal Grandmother     Past Surgical History:  Procedure Laterality Date   ABDOMINAL HYSTERECTOMY  1986   "partial" Uterine cancer   ANKLE FUSION Left 10/24/2013   Procedure: LEFT ANKLE SUBTALAR AND TALONAVICULAR FUSION;  Surgeon: Nadara Mustard, MD;  Location: [facility] OR;  Service: Orthopedics;  Laterality: Left;   ANKLE FUSION Left 08/26/2014   Procedure: Left Foot Take Down Non-union with Revision Talonavicular and Subtalar Fusion;  Surgeon: Nadara Mustard, MD;  Location: [facility] OR;  Service: Orthopedics;  Laterality: Left;   BACK SURGERY     BILATERAL SALPINGECTOMY   2010   w/LOA   CARPOMETACARPEL SUSPENSION PLASTY Left 10/25/2022   Procedure: LEFT THUMB CARPOMETACARPAL  ARTHROSPLASTY;  Surgeon: Tarry Kos, MD;  Location: [facility];  Service: Orthopedics;  Laterality: Left;   COLONOSCOPY     COLONOSCOPY N/A 07/14/2016   Procedure: COLONOSCOPY;  Surgeon: West Bali, MD;  [facility];  Service: Endoscopy;  Laterality: N/A;  8:30   FUSION OF TALONAVICULAR JOINT Left    Take Down Non-union with Revision Talonavicular and Subtalar Fusion /notes 08/26/2014   HAMMER TOE SURGERY Bilateral 2000-2013   right-left   JOINT REPLACEMENT     Right knee   KNEE ARTHROSCOPY Bilateral 2008-2010    left-right   LUMBAR DISC SURGERY  2004   Lumbar 4- 5   POLYPECTOMY  07/14/2016   Procedure: POLYPECTOMY;  Surgeon: West Bali, MD;  [facility];  Service: Endoscopy;;  hepatic flexure   SHOULDER ARTHROSCOPY WITH SUBACROMIAL DECOMPRESSION, ROTATOR CUFF REPAIR AND BICEP TENDON REPAIR Right 02/11/2019   Procedure: right shoulde arthroscopy, biceps tenodesis lower trapezius tendon transfer;  Surgeon: Cammy Copa, MD;  Location: [facility] OR;  Service: Orthopedics;  Laterality: Right;   TOTAL KNEE ARTHROPLASTY Right 2013   TUBAL LIGATION     Social History   Occupational History   Not on file  Tobacco Use   Smoking status: Former    Current packs/day: 0.00    Average packs/day: 0.5 packs/day for 10.0 years (5.0 ttl pk-yrs)    Types: Cigarettes    Start date: 03/27/1978    Quit date: 03/27/1988    Years since quitting: 34.6   Smokeless tobacco: Never  Vaping Use   Vaping status: Never Used  Substance and Sexual Activity   Alcohol use: Yes    Comment: occasionally a beer   Drug use: No   Sexual activity: Not Currently    Birth control/protection: Surgical    Comment: hyst

## 2022-11-24 ENCOUNTER — Ambulatory Visit: Payer: Medicare HMO | Admitting: Physician Assistant

## 2022-11-24 DIAGNOSIS — M1812 Unilateral primary osteoarthritis of first carpometacarpal joint, left hand: Secondary | ICD-10-CM | POA: Diagnosis not present

## 2022-11-24 NOTE — Progress Notes (Signed)
Post-Op Visit Note   Patient: [name]           Date of Birth: 02/15/1956           MRN: [id number] Visit Date: 11/24/2022 PCP: Sharon Seller, NP   Assessment & Plan:  Chief Complaint:  Chief Complaint  Patient presents with   Left Hand - Follow-up    Left thumb [facility] arthroplasty 10/26/2022   Visit Diagnoses:  1. Primary osteoarthritis of first carpometacarpal joint of left hand     Plan: Patient is a pleasant 67 year old female who comes in today 4 weeks status post left thumb [facility] arthroplasty 10/26/2022.  She has been doing well.  She is not taking anything for pain.  She only complains of stiffness about the left thumb as she has been in a thumb spica cast for the past 2 weeks.  Examination of the left thumb out of the cast reveals a well-healed surgical incision.  No signs of infection or cellulitis.  Fingers warm well-perfused.  Today, we have applied a Velcro thumb spica splint.  Have made a referral to hand therapy at [facility] to include thermoplastic splinting.  Patient will follow-up with [place] in 6 weeks for recheck.  Call with concerns or questions.  Follow-Up Instructions: Return in about 6 weeks (around 01/05/2023).   Orders:  No orders of the defined types were placed in this encounter.  No orders of the defined types were placed in this encounter.   Imaging: No new imaging  PMFS History: Patient Active Problem List   Diagnosis Date Noted   Primary osteoarthritis of first carpometacarpal joint of left hand 10/25/2022   Pelvic relaxation due to cystocele, midline 10/25/2020   Upper airway cough syndrome 07/30/2018   Morbid (severe) obesity due to excess calories (HCC) 07/30/2018   Depression, major, single episode, moderate (HCC) 10/17/2017   Hypothyroidism 09/19/2017   Mixed stress and urge urinary incontinence 02/10/2014   Diabetes (HCC) 01/18/2014   Hyperlipidemia LDL goal <100 03/10/2013   Hot flashes 07/05/2012   Rectocele 07/05/2012   Essential  hypertension 07/04/2012   Stiffness of joint, lower leg, left 08/28/2011   Pain in left knee 08/28/2011   Past Medical History:  Diagnosis Date   Arthritis    "fingers occasionally" (08/26/2014)   Asthmatic bronchitis    "haven't used an inhaler in years" (08/26/2014)   Cervical cancer (HCC)    Counseling for estrogen replacement therapy 12/30/2014   Depression    Diabetes mellitus without complication (HCC)    type- 2   Elevated cholesterol    Exertional shortness of breath    GERD (gastroesophageal reflux disease)    Hot flashes 07/05/2012   Hypertension    Hypothyroidism    IFG (impaired fasting glucose)    Mixed stress and urge urinary incontinence    Moody 12/30/2014   Obesity    Panniculus    PONV (postoperative nausea and vomiting)    Rectocele 07/05/2012   Stress incontinence    Superficial fungus infection of skin     Family History  Problem Relation Age of Onset   Heart disease Mother    Stroke Mother    Heart disease Father    Heart attack Father        MI at age 74   Diabetes Brother    Hypertension Brother    Diabetes Brother    Hypertension Brother    Other Brother        Gastrointestional disease  Cancer Maternal Aunt        cervical   Diabetes Maternal Grandmother    Diabetes Paternal Grandmother     Past Surgical History:  Procedure Laterality Date   ABDOMINAL HYSTERECTOMY  1986   "partial" Uterine cancer   ANKLE FUSION Left 10/24/2013   Procedure: LEFT ANKLE SUBTALAR AND TALONAVICULAR FUSION;  Surgeon: Nadara Mustard, MD;  Location: [facility] OR;  Service: Orthopedics;  Laterality: Left;   ANKLE FUSION Left 08/26/2014   Procedure: Left Foot Take Down Non-union with Revision Talonavicular and Subtalar Fusion;  Surgeon: Nadara Mustard, MD;  Location: [facility] OR;  Service: Orthopedics;  Laterality: Left;   BACK SURGERY     BILATERAL SALPINGECTOMY  2010   w/LOA   CARPOMETACARPEL SUSPENSION PLASTY Left 10/25/2022   Procedure: LEFT THUMB CARPOMETACARPAL   ARTHROSPLASTY;  Surgeon: Tarry Kos, MD;  Location:  SURGERY CENTER;  Service: Orthopedics;  Laterality: Left;   COLONOSCOPY     COLONOSCOPY N/A 07/14/2016   Procedure: COLONOSCOPY;  Surgeon: West Bali, MD;  Location: [facility];  Service: Endoscopy;  Laterality: N/A;  8:30   FUSION OF TALONAVICULAR JOINT Left    Take Down Non-union with Revision Talonavicular and Subtalar Fusion /notes 08/26/2014   HAMMER TOE SURGERY Bilateral 2000-2013   right-left   JOINT REPLACEMENT     Right knee   KNEE ARTHROSCOPY Bilateral 2008-2010    left-right   LUMBAR DISC SURGERY  2004   Lumbar 4- 5   POLYPECTOMY  07/14/2016   Procedure: POLYPECTOMY;  Surgeon: West Bali, MD;  Location: [facility];  Service: Endoscopy;;  hepatic flexure   SHOULDER ARTHROSCOPY WITH SUBACROMIAL DECOMPRESSION, ROTATOR CUFF REPAIR AND BICEP TENDON REPAIR Right 02/11/2019   Procedure: right shoulde arthroscopy, biceps tenodesis lower trapezius tendon transfer;  Surgeon: Cammy Copa, MD;  Location: [facility] OR;  Service: Orthopedics;  Laterality: Right;   TOTAL KNEE ARTHROPLASTY Right 2013   TUBAL LIGATION     Social History   Occupational History   Not on file  Tobacco Use   Smoking status: Former    Current packs/day: 0.00    Average packs/day: 0.5 packs/day for 10.0 years (5.0 ttl pk-yrs)    Types: Cigarettes    Start date: 03/27/1978    Quit date: 03/27/1988    Years since quitting: 34.6   Smokeless tobacco: Never  Vaping Use   Vaping status: Never Used  Substance and Sexual Activity   Alcohol use: Yes    Comment: occasionally a beer   Drug use: No   Sexual activity: Not Currently    Birth control/protection: Surgical    Comment: hyst

## 2022-12-15 ENCOUNTER — Other Ambulatory Visit: Payer: Self-pay

## 2022-12-15 ENCOUNTER — Ambulatory Visit (HOSPITAL_COMMUNITY): Payer: Medicare HMO | Attending: Physician Assistant | Admitting: Occupational Therapy

## 2022-12-15 ENCOUNTER — Encounter (HOSPITAL_COMMUNITY): Payer: Self-pay | Admitting: Occupational Therapy

## 2022-12-15 DIAGNOSIS — R29898 Other symptoms and signs involving the musculoskeletal system: Secondary | ICD-10-CM | POA: Diagnosis not present

## 2022-12-15 DIAGNOSIS — M79642 Pain in left hand: Secondary | ICD-10-CM | POA: Diagnosis not present

## 2022-12-15 DIAGNOSIS — M1812 Unilateral primary osteoarthritis of first carpometacarpal joint, left hand: Secondary | ICD-10-CM | POA: Insufficient documentation

## 2022-12-15 NOTE — Patient Instructions (Signed)
AROM Exercises: *Complete exercises 10 times each, 3 times per day*  1) Wrist Flexion  Start with wrist at edge of table, palm facing up. With wrist hanging slightly off table, curl wrist upward, and back down.      2) Wrist Extension  Start with wrist at edge of table, palm facing down. With wrist slightly off the edge of the table, curl wrist up and back down.      3) Radial Deviations  Start with forearm flat against a table, wrist hanging slightly off the edge, and palm facing the wall. Bending at the wrist only, and keeping palm facing the wall, bend wrist so fist is pointing towards the floor, back up to start position, and up towards the ceiling. Return to start.       Hand Exercises: Complete each exercise 10-15X, 2-3X/day  1) Towel crunch Place a small towel on a firm table top. Flatten out the towel and then place your hand on one end of it.  Next, flex your fingers 2-5 (index finger through pinky finger) as you pull the towel towards your hand.    2) Digit composite flexion/adduction (make a fist) Hold your hand up as shown. Open and close your hand into a fist and repeat. If you cannot make a full fist, then make a partial fist.    3) Thumb/finger opposition Touch the tip of the thumb to each fingertip one by one. Extend fingers fully after they are touched.      4) Finger Taps Start with the hand flat and fingers slightly spread.  One at a time, starting with the thumb, lift each finger up separately.     5) Digit Abduction/Adduction Hold hand palm down flat on table. Spread your fingers apart and back together.

## 2022-12-15 NOTE — Therapy (Signed)
OUTPATIENT OCCUPATIONAL THERAPY ORTHO EVALUATION  Patient Name: [name] MRN: [id number] DOB:10-20-1955, 67 y.o., female Today's Date: 12/15/2022    END OF SESSION:  OT End of Session - 12/15/22 1021     Visit Number 1    Number of Visits 5    Date for OT Re-Evaluation 01/14/23    Authorization Type [employer]    Progress Note Due on Visit 10    OT Start Time [phone]    OT Stop Time 1015    OT Time Calculation (min) 47 min    Activity Tolerance Patient tolerated treatment well    Behavior During Therapy [facility] for tasks assessed/performed             Past Medical History:  Diagnosis Date   Arthritis    "fingers occasionally" (08/26/2014)   Asthmatic bronchitis    "haven't used an inhaler in years" (08/26/2014)   Cervical cancer (HCC)    Counseling for estrogen replacement therapy 12/30/2014   Depression    Diabetes mellitus without complication (HCC)    type- 2   Elevated cholesterol    Exertional shortness of breath    GERD (gastroesophageal reflux disease)    Hot flashes 07/05/2012   Hypertension    Hypothyroidism    IFG (impaired fasting glucose)    Mixed stress and urge urinary incontinence    Moody 12/30/2014   Obesity    Panniculus    PONV (postoperative nausea and vomiting)    Rectocele 07/05/2012   Stress incontinence    Superficial fungus infection of skin    Past Surgical History:  Procedure Laterality Date   ABDOMINAL HYSTERECTOMY  1986   "partial" Uterine cancer   ANKLE FUSION Left 10/24/2013   Procedure: LEFT ANKLE SUBTALAR AND TALONAVICULAR FUSION;  Surgeon: Nadara Mustard, MD;  Location: [facility];  Service: Orthopedics;  Laterality: Left;   ANKLE FUSION Left 08/26/2014   Procedure: Left Foot Take Down Non-union with Revision Talonavicular and Subtalar Fusion;  Surgeon: Nadara Mustard, MD;  Location: [facility];  Service: Orthopedics;  Laterality: Left;   BACK SURGERY     BILATERAL SALPINGECTOMY  2010   w/LOA   CARPOMETACARPEL SUSPENSION PLASTY Left  10/25/2022   Procedure: LEFT THUMB CARPOMETACARPAL  ARTHROSPLASTY;  Surgeon: Tarry Kos, MD;  Location: [facility];  Service: Orthopedics;  Laterality: Left;   COLONOSCOPY     COLONOSCOPY N/A 07/14/2016   Procedure: COLONOSCOPY;  Surgeon: West Bali, MD;  [facility];  Service: Endoscopy;  Laterality: N/A;  8:30   FUSION OF TALONAVICULAR JOINT Left    Take Down Non-union with Revision Talonavicular and Subtalar Fusion /notes 08/26/2014   HAMMER TOE SURGERY Bilateral 2000-2013   right-left   JOINT REPLACEMENT     Right knee   KNEE ARTHROSCOPY Bilateral 2008-2010    left-right   LUMBAR DISC SURGERY  2004   Lumbar 4- 5   POLYPECTOMY  07/14/2016   Procedure: POLYPECTOMY;  Surgeon: West Bali, MD;  [facility];  Service: Endoscopy;;  hepatic flexure   SHOULDER ARTHROSCOPY WITH SUBACROMIAL DECOMPRESSION, ROTATOR CUFF REPAIR AND BICEP TENDON REPAIR Right 02/11/2019   Procedure: right shoulde arthroscopy, biceps tenodesis lower trapezius tendon transfer;  Surgeon: Cammy Copa, MD;  Location: [facility] OR;  Service: Orthopedics;  Laterality: Right;   TOTAL KNEE ARTHROPLASTY Right 2013   TUBAL LIGATION     Patient Active Problem List   Diagnosis Date Noted   Primary osteoarthritis  of first carpometacarpal joint of left hand 10/25/2022   Pelvic relaxation due to cystocele, midline 10/25/2020   Upper airway cough syndrome 07/30/2018   Morbid (severe) obesity due to excess calories (HCC) 07/30/2018   Depression, major, single episode, moderate (HCC) 10/17/2017   Hypothyroidism 09/19/2017   Mixed stress and urge urinary incontinence 02/10/2014   Diabetes (HCC) 01/18/2014   Hyperlipidemia LDL goal <100 03/10/2013   Hot flashes 07/05/2012   Rectocele 07/05/2012   Essential hypertension 07/04/2012   Stiffness of joint, lower leg, left 08/28/2011   Pain in left knee 08/28/2011   PCP: Abbey Chatters, NP REFERRING PROVIDER: Jari Sportsman, PA-C (Dr.  Coralyn Mark Xu=surgeon)  ONSET DATE: 10/25/22  REFERRING DIAG: s/p left cmc arthroplasty  THERAPY DIAG:  Pain in left hand  Other symptoms and signs involving the musculoskeletal system  Rationale for Evaluation and Treatment: Rehabilitation  SUBJECTIVE:   SUBJECTIVE STATEMENT: S: This has been worse than any other surgeries.  Pt accompanied by: self  PERTINENT HISTORY: Pt is a 67 y/o female s/p left cmc arthroplasty on 10/25/22.   PRECAUTIONS: Other: Standard protocol    WEIGHT BEARING RESTRICTIONS: No  PAIN:  Are you having pain? No  FALLS: Has patient fallen in last 6 months? No  PLOF: Independent  PATIENT GOALS: To be able to use the left hand more.   NEXT MD VISIT: 01/05/23  OBJECTIVE:   HAND DOMINANCE: Right  ADLs: Overall ADLs: Pt is unable to use the left hand for gripping items, opening jars/bottles. Pt uses it to stabilize items at times, has difficulty folding clothes/laundry. Pt with difficulty with toileting/hygiene. Can't open or shut doors.    FUNCTIONAL OUTCOME MEASURES: Quick Dash: 65.91  UPPER EXTREMITY ROM:     Active ROM Left eval  Wrist flexion 30  Wrist extension 42  Wrist ulnar deviation 22  Wrist radial deviation 8  Wrist pronation 90  Wrist supination 90  (Blank rows = not tested)  Active ROM Left eval  Thumb MCP (0-60) 10  Thumb IP (0-80) 40  Thumb Radial abd/add (0-55)    Thumb Palmar abd/add (0-45)    Thumb Opposition to Small Finger  1 cm  Index MCP (0-90)    Index PIP (0-100)    Index DIP (0-70)    Long MCP (0-90)    Long PIP (0-100)    Long DIP (0-70)    Ring MCP (0-90)    Ring PIP (0-100)    Ring DIP (0-70)    Little MCP (0-90)    Little PIP (0-100)    Little DIP (0-70)    (Blank rows = not tested)   UPPER EXTREMITY MMT:     MMT Left eval  Wrist flexion 5/5  Wrist extension 4+/5  Wrist ulnar deviation 4+/5  Wrist radial deviation 4+/5  (Blank rows = not tested)  HAND FUNCTION: Grip strength: Right:  68 lbs; Left: 23 lbs, Lateral pinch: Right: 14 lbs, Left: 5 lbs, and 3 point pinch: Right: 13 lbs, Left: 3 lbs  EDEMA: Mild edema on observation at 2-4th MCPs  COGNITION: Overall cognitive status: Within functional limits for tasks assessed   TODAY'S TREATMENT:  DATE:  12/15/22 -Splinting: fabricated left thumb spica splint, wrist at neutral. Strapping to proximal forearm and across hand, thumb post open. Instructed in wear and care.    PATIENT EDUCATION: Education details: wrist A/ROM, finger A/ROM, splint wear and care Person educated: Patient Education method: Explanation, Demonstration, and Handouts Education comprehension: verbalized understanding and returned demonstration  HOME EXERCISE PROGRAM: Eval: wrist A/ROM, finger A/ROM, splint wear and care  GOALS: Goals reviewed with patient? Yes  SHORT TERM GOALS: Target date: 01/14/23  Pt will be provided with and educated on HEP to improve mobility of left hand and thumb required for use as non-dominant during ADLs.   Goal status: INITIAL  2.  Pt will increase left thumb A/ROM by 10+ degrees to improve ability to utilize hand to assist with folding laundry.   Goal status: INITIAL  3.  Pt will increase left wrist A/ROM by 15+ degrees to improve mobility required for reaching and grasping objects.   Goal status: INITIAL  4.  Pt will increase left grip strength by 20# and pinch strength by 4# to improve ability to complete meal preparation tasks incorporating LUE.   Goal status: INITIAL  5.  Pt will decrease pain in left hand to 2/10 or less to improve ability to sleep for 3+ consecutive hours without waking due to pain in the hand.   Goal status: INITIAL  6.  Pt will be educated on splint wear and care to provide support for left hand during post surgical protective healing phase.   Goal status:  INITIAL    ASSESSMENT:  CLINICAL IMPRESSION: Patient is a 67 y.o. female who was seen today for occupational therapy evaluation s/p left cmc arthroplasty on 10/25/22. Pt presents with pre-fabricated thumb spica splint, on observation splint is large and bulky, obstructs MCPs and encapsulates entire thumb. Custom fabricated volar based thumb spica splint today, pt independent in donning and doffing, pleased with fit. Pt presenting with decreased ROM, strength, and functional use of left hand impacting use during ADLs and functional tasks.   PERFORMANCE DEFICITS: in functional skills including ADLs, IADLs, coordination, dexterity, edema, ROM, strength, pain, fascial restrictions, and UE functional use  IMPAIRMENTS: are limiting patient from ADLs, IADLs, rest and sleep, and leisure.   COMORBIDITIES: has no other co-morbidities that affects occupational performance. Patient will benefit from skilled OT to address above impairments and improve overall function.  MODIFICATION OR ASSISTANCE TO COMPLETE EVALUATION: No modification of tasks or assist necessary to complete an evaluation.  OT OCCUPATIONAL PROFILE AND HISTORY: Problem focused assessment: Including review of records relating to presenting problem.  CLINICAL DECISION MAKING: LOW - limited treatment options, no task modification necessary  REHAB POTENTIAL: Good  EVALUATION COMPLEXITY: Low      PLAN:  OT FREQUENCY: 1x/week  OT DURATION: 4 weeks  PLANNED INTERVENTIONS: self care/ADL training, therapeutic exercise, therapeutic activity, manual therapy, scar mobilization, passive range of motion, splinting, electrical stimulation, ultrasound, moist heat, patient/family education, and DME and/or AE instructions  RECOMMENDED OTHER SERVICES: None at this time  CONSULTED AND AGREED WITH PLAN OF CARE: Patient  PLAN FOR NEXT SESSION: Follow up on splint wear and adjust if needed, provide edema glove, wrist and digit A/ROM   Ezra Sites, OTR/L  [phone] 12/15/2022, 10:23 AM

## 2022-12-16 ENCOUNTER — Other Ambulatory Visit: Payer: Self-pay | Admitting: Nurse Practitioner

## 2022-12-16 DIAGNOSIS — E785 Hyperlipidemia, unspecified: Secondary | ICD-10-CM

## 2022-12-20 ENCOUNTER — Encounter (HOSPITAL_COMMUNITY): Payer: Self-pay | Admitting: Occupational Therapy

## 2022-12-20 ENCOUNTER — Ambulatory Visit (HOSPITAL_COMMUNITY): Payer: Medicare HMO | Admitting: Occupational Therapy

## 2022-12-20 DIAGNOSIS — M79642 Pain in left hand: Secondary | ICD-10-CM | POA: Diagnosis not present

## 2022-12-20 DIAGNOSIS — R29898 Other symptoms and signs involving the musculoskeletal system: Secondary | ICD-10-CM | POA: Diagnosis not present

## 2022-12-20 DIAGNOSIS — M1812 Unilateral primary osteoarthritis of first carpometacarpal joint, left hand: Secondary | ICD-10-CM | POA: Diagnosis not present

## 2022-12-20 NOTE — Therapy (Unsigned)
OUTPATIENT OCCUPATIONAL THERAPY ORTHO TREATMENT NOTE  Patient Name: [name] MRN: [id number] DOB:1956-03-18, 67 y.o., female Today's Date: 12/21/2022    END OF SESSION:  OT End of Session - 12/20/22 0932     Visit Number 2    Number of Visits 5    Date for OT Re-Evaluation 01/14/23    Authorization Type [employer]    Progress Note Due on Visit 10    OT Start Time [phone]    OT Stop Time 0932    OT Time Calculation (min) 39 min    Activity Tolerance Patient tolerated treatment well    Behavior During Therapy [facility] for tasks assessed/performed             Past Medical History:  Diagnosis Date   Arthritis    "fingers occasionally" (08/26/2014)   Asthmatic bronchitis    "haven't used an inhaler in years" (08/26/2014)   Cervical cancer (HCC)    Counseling for estrogen replacement therapy 12/30/2014   Depression    Diabetes mellitus without complication (HCC)    type- 2   Elevated cholesterol    Exertional shortness of breath    GERD (gastroesophageal reflux disease)    Hot flashes 07/05/2012   Hypertension    Hypothyroidism    IFG (impaired fasting glucose)    Mixed stress and urge urinary incontinence    Moody 12/30/2014   Obesity    Panniculus    PONV (postoperative nausea and vomiting)    Rectocele 07/05/2012   Stress incontinence    Superficial fungus infection of skin    Past Surgical History:  Procedure Laterality Date   ABDOMINAL HYSTERECTOMY  1986   "partial" Uterine cancer   ANKLE FUSION Left 10/24/2013   Procedure: LEFT ANKLE SUBTALAR AND TALONAVICULAR FUSION;  Surgeon: Nadara Mustard, MD;  Location: [facility] OR;  Service: Orthopedics;  Laterality: Left;   ANKLE FUSION Left 08/26/2014   Procedure: Left Foot Take Down Non-union with Revision Talonavicular and Subtalar Fusion;  Surgeon: Nadara Mustard, MD;  Location: [facility] OR;  Service: Orthopedics;  Laterality: Left;   BACK SURGERY     BILATERAL SALPINGECTOMY  2010   w/LOA   CARPOMETACARPEL SUSPENSION PLASTY Left  10/25/2022   Procedure: LEFT THUMB CARPOMETACARPAL  ARTHROSPLASTY;  Surgeon: Tarry Kos, MD;  Location:  SURGERY CENTER;  Service: Orthopedics;  Laterality: Left;   COLONOSCOPY     COLONOSCOPY N/A 07/14/2016   Procedure: COLONOSCOPY;  Surgeon: West Bali, MD;  Location: [facility];  Service: Endoscopy;  Laterality: N/A;  8:30   FUSION OF TALONAVICULAR JOINT Left    Take Down Non-union with Revision Talonavicular and Subtalar Fusion /notes 08/26/2014   HAMMER TOE SURGERY Bilateral 2000-2013   right-left   JOINT REPLACEMENT     Right knee   KNEE ARTHROSCOPY Bilateral 2008-2010    left-right   LUMBAR DISC SURGERY  2004   Lumbar 4- 5   POLYPECTOMY  07/14/2016   Procedure: POLYPECTOMY;  Surgeon: West Bali, MD;  Location: [facility];  Service: Endoscopy;;  hepatic flexure   SHOULDER ARTHROSCOPY WITH SUBACROMIAL DECOMPRESSION, ROTATOR CUFF REPAIR AND BICEP TENDON REPAIR Right 02/11/2019   Procedure: right shoulde arthroscopy, biceps tenodesis lower trapezius tendon transfer;  Surgeon: Cammy Copa, MD;  Location: [facility] OR;  Service: Orthopedics;  Laterality: Right;   TOTAL KNEE ARTHROPLASTY Right 2013   TUBAL LIGATION     Patient Active Problem List   Diagnosis Date Noted   Primary  osteoarthritis of first carpometacarpal joint of left hand 10/25/2022   Pelvic relaxation due to cystocele, midline 10/25/2020   Upper airway cough syndrome 07/30/2018   Morbid (severe) obesity due to excess calories (HCC) 07/30/2018   Depression, major, single episode, moderate (HCC) 10/17/2017   Hypothyroidism 09/19/2017   Mixed stress and urge urinary incontinence 02/10/2014   Diabetes (HCC) 01/18/2014   Hyperlipidemia LDL goal <100 03/10/2013   Hot flashes 07/05/2012   Rectocele 07/05/2012   Essential hypertension 07/04/2012   Stiffness of joint, lower leg, left 08/28/2011   Pain in left knee 08/28/2011   PCP: Abbey Chatters, NP REFERRING PROVIDER: Jari Sportsman, PA-C (Dr.  Coralyn Mark Xu=surgeon)  ONSET DATE: 10/25/22  REFERRING DIAG: s/p left cmc arthroplasty  THERAPY DIAG:  Pain in left hand  Other symptoms and signs involving the musculoskeletal system  Rationale for Evaluation and Treatment: Rehabilitation  SUBJECTIVE:   SUBJECTIVE STATEMENT: S: Things are feeling a bit better  Pt accompanied by: self  PERTINENT HISTORY: Pt is a 67 y/o female s/p left cmc arthroplasty on 10/25/22.   PRECAUTIONS: Other: Standard protocol    WEIGHT BEARING RESTRICTIONS: No  PAIN:  Are you having pain? No  FALLS: Has patient fallen in last 6 months? No  PLOF: Independent  PATIENT GOALS: To be able to use the left hand more.   NEXT MD VISIT: 01/05/23  OBJECTIVE:   HAND DOMINANCE: Right  ADLs: Overall ADLs: Pt is unable to use the left hand for gripping items, opening jars/bottles. Pt uses it to stabilize items at times, has difficulty folding clothes/laundry. Pt with difficulty with toileting/hygiene. Can't open or shut doors.    FUNCTIONAL OUTCOME MEASURES: Quick Dash: 65.91  UPPER EXTREMITY ROM:     Active ROM Left eval  Wrist flexion 30  Wrist extension 42  Wrist ulnar deviation 22  Wrist radial deviation 8  Wrist pronation 90  Wrist supination 90  (Blank rows = not tested)  Active ROM Left eval  Thumb MCP (0-60) 10  Thumb IP (0-80) 40  Thumb Radial abd/add (0-55)    Thumb Palmar abd/add (0-45)    Thumb Opposition to Small Finger  1 cm  Index MCP (0-90)    Index PIP (0-100)    Index DIP (0-70)    Long MCP (0-90)    Long PIP (0-100)    Long DIP (0-70)    Ring MCP (0-90)    Ring PIP (0-100)    Ring DIP (0-70)    Little MCP (0-90)    Little PIP (0-100)    Little DIP (0-70)    (Blank rows = not tested)   UPPER EXTREMITY MMT:     MMT Left eval  Wrist flexion 5/5  Wrist extension 4+/5  Wrist ulnar deviation 4+/5  Wrist radial deviation 4+/5  (Blank rows = not tested)  HAND FUNCTION: Grip strength: Right: 68 lbs; Left:  23 lbs, Lateral pinch: Right: 14 lbs, Left: 5 lbs, and 3 point pinch: Right: 13 lbs, Left: 3 lbs  EDEMA: Mild edema on observation at 2-4th MCPs  COGNITION: Overall cognitive status: Within functional limits for tasks assessed   TODAY'S TREATMENT:  DATE:   12/20/22 -Wrist ROM: flexion, extension, ulnar/radial deviation, supination/pronation, x10 -Digit ROM: composite flexion, abduction/adduction, opposition, finger tips, x10 -Weighted Stretches: 3lb weight, flexion, extension, supination, 3x20" -Pinch Tree: yellow, red, green resistance clips, tripod pinch up, lateral pinch down, x6 resistance clips -Theraputty: yellow putty, roll into a ball, flatten into a pancake, roll into a log, tripod pinch x10, lateral pinch x10, roll into ball, squeeze x10 -self stretches: flexion, extension, thumb flexion, 3x20"  12/15/22 -Splinting: fabricated left thumb spica splint, wrist at neutral. Strapping to proximal forearm and across hand, thumb post open. Instructed in wear and care.    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 Yes  SHORT TERM GOALS: Target date: 01/14/23  Pt will be provided with and educated on HEP to improve mobility of left hand and thumb required for use as non-dominant during ADLs.   Goal status: IN PROGRESS  2.  Pt will increase left thumb A/ROM by 10+ degrees to improve ability to utilize hand to assist with folding laundry.   Goal status: IN PROGRESS  3.  Pt will increase left wrist A/ROM by 15+  degrees to improve mobility required for reaching and grasping objects.   Goal status: IN PROGRESS  4.  Pt will increase left grip strength by 20# and pinch strength by 4# to improve ability to complete meal preparation tasks incorporating LUE.   Goal status: IN PROGRESS  5.  Pt will decrease pain in left hand to 2/10 or less to improve ability to sleep for 3+ consecutive hours without waking due to pain in the hand.   Goal status: IN PROGRESS  6.  Pt will be educated on splint wear and care to provide support for left hand during post surgical protective healing phase.   Goal status: IN PROGRESS    ASSESSMENT:  CLINICAL IMPRESSION: This session pt working on her grip and pinch, as well as wrist ROM. She is having continued stiffness along the radial aspect of her wrist and thumb. Opposition continues to be difficulty due to stiffness and pain in the thumb. Pt also had difficulty and fatigue with theraputty, requiring multiple rest/stretch breaks. OT providing verbal and tactile cuing for positioning and technique.   PERFORMANCE DEFICITS: in functional skills including ADLs, IADLs, coordination, dexterity, edema, ROM, strength, pain, fascial restrictions, and UE functional use   PLAN:  OT FREQUENCY: 1x/week  OT DURATION: 4 weeks  PLANNED INTERVENTIONS: self care/ADL training, therapeutic exercise, therapeutic activity, manual therapy, scar mobilization, passive range of motion, splinting, electrical stimulation, ultrasound, moist heat, patient/family education, and DME and/or AE instructions  RECOMMENDED OTHER SERVICES: None at this time  CONSULTED AND AGREED WITH PLAN OF CARE: Patient  PLAN FOR NEXT SESSION: Follow up on splint wear and adjust if needed, provide edema glove, wrist and digit A/ROM   Trish Mage, OTR/L [phone] 12/21/2022, 12:17 PM

## 2022-12-27 ENCOUNTER — Ambulatory Visit (HOSPITAL_COMMUNITY): Payer: Medicare HMO | Attending: Physician Assistant | Admitting: Occupational Therapy

## 2022-12-27 ENCOUNTER — Encounter (HOSPITAL_COMMUNITY): Payer: Self-pay | Admitting: Occupational Therapy

## 2022-12-27 DIAGNOSIS — R29898 Other symptoms and signs involving the musculoskeletal system: Secondary | ICD-10-CM | POA: Diagnosis not present

## 2022-12-27 DIAGNOSIS — M79642 Pain in left hand: Secondary | ICD-10-CM | POA: Insufficient documentation

## 2022-12-27 NOTE — Therapy (Unsigned)
OUTPATIENT OCCUPATIONAL THERAPY ORTHO TREATMENT NOTE  Patient Name: VALE PERAZA MRN: [id number] DOB:[birth year], 67 y.o., female Today's Date: 12/28/2022    END OF SESSION:   12/27/22 1020  OT Visits / Re-Eval  Visit Number 3  Number of Visits 5  Date for OT Re-Evaluation 01/14/23  Authorization  Authorization Type [employer]  Progress Note Due on Visit 10  OT Time Calculation  OT Start Time [id number]  OT Stop Time 1019  OT Time Calculation (min) 38 min  End of Session  Activity Tolerance Patient tolerated treatment well  Behavior During Therapy WFL for tasks assessed/performed     Past Medical History:  Diagnosis Date   Arthritis    "fingers occasionally" (08/26/2014)   Asthmatic bronchitis    "haven't used an inhaler in years" (08/26/2014)   Cervical cancer (HCC)    Counseling for estrogen replacement therapy 12/30/2014   Depression    Diabetes mellitus without complication (HCC)    type- 2   Elevated cholesterol    Exertional shortness of breath    GERD (gastroesophageal reflux disease)    Hot flashes 07/05/2012   Hypertension    Hypothyroidism    IFG (impaired fasting glucose)    Mixed stress and urge urinary incontinence    Moody 12/30/2014   Obesity    Panniculus    PONV (postoperative nausea and vomiting)    Rectocele 07/05/2012   Stress incontinence    Superficial fungus infection of skin    Past Surgical History:  Procedure Laterality Date   ABDOMINAL HYSTERECTOMY  1986   "partial" Uterine cancer   ANKLE FUSION Left 10/24/2013   Procedure: LEFT ANKLE SUBTALAR AND TALONAVICULAR FUSION;  Surgeon: Nadara Mustard, MD;  Location: [facility];  Service: Orthopedics;  Laterality: Left;   ANKLE FUSION Left 08/26/2014   Procedure: Left Foot Take Down Non-union with Revision Talonavicular and Subtalar Fusion;  Surgeon: Nadara Mustard, MD;  Location: [facility];  Service: Orthopedics;  Laterality: Left;   BACK SURGERY     BILATERAL SALPINGECTOMY  2010   w/LOA    CARPOMETACARPEL SUSPENSION PLASTY Left 10/25/2022   Procedure: LEFT THUMB CARPOMETACARPAL  ARTHROSPLASTY;  Surgeon: Tarry Kos, MD;  Location: [facility];  Service: Orthopedics;  Laterality: Left;   COLONOSCOPY     COLONOSCOPY N/A 07/14/2016   Procedure: COLONOSCOPY;  Surgeon: West Bali, MD;  Location: [facility];  Service: Endoscopy;  Laterality: N/A;  8:30   FUSION OF TALONAVICULAR JOINT Left    Take Down Non-union with Revision Talonavicular and Subtalar Fusion /notes 08/26/2014   HAMMER TOE SURGERY Bilateral 2000-2013   right-left   JOINT REPLACEMENT     Right knee   KNEE ARTHROSCOPY Bilateral 2008-2010    left-right   LUMBAR DISC SURGERY  2004   Lumbar 4- 5   POLYPECTOMY  07/14/2016   Procedure: POLYPECTOMY;  Surgeon: West Bali, MD;  Location: [facility];  Service: Endoscopy;;  hepatic flexure   SHOULDER ARTHROSCOPY WITH SUBACROMIAL DECOMPRESSION, ROTATOR CUFF REPAIR AND BICEP TENDON REPAIR Right 02/11/2019   Procedure: right shoulde arthroscopy, biceps tenodesis lower trapezius tendon transfer;  Surgeon: Cammy Copa, MD;  Location: [facility] OR;  Service: Orthopedics;  Laterality: Right;   TOTAL KNEE ARTHROPLASTY Right 2013   TUBAL LIGATION     Patient Active Problem List   Diagnosis Date Noted   Primary osteoarthritis of first carpometacarpal joint of left hand 10/25/2022   Pelvic relaxation due to cystocele, midline 10/25/2020  Upper airway cough syndrome 07/30/2018   Morbid (severe) obesity due to excess calories (HCC) 07/30/2018   Depression, major, single episode, moderate (HCC) 10/17/2017   Hypothyroidism 09/19/2017   Mixed stress and urge urinary incontinence 02/10/2014   Diabetes (HCC) 01/18/2014   Hyperlipidemia LDL goal <100 03/10/2013   Hot flashes 07/05/2012   Rectocele 07/05/2012   Essential hypertension 07/04/2012   Joint stiffness of left lower leg 08/28/2011   Pain in left knee 08/28/2011   PCP: Abbey Chatters, NP REFERRING  PROVIDER: Jari Sportsman, PA-C (Dr. Lonia Farber)  ONSET DATE: 10/25/22  REFERRING DIAG: s/p left cmc arthroplasty  THERAPY DIAG:  Pain in left hand  Other symptoms and signs involving the musculoskeletal system  Rationale for Evaluation and Treatment: Rehabilitation  SUBJECTIVE:   SUBJECTIVE STATEMENT: S: I've been using my hand a lot, so I'm sore" Pt accompanied by: self  PERTINENT HISTORY: Pt is a 67 y/o female s/p left cmc arthroplasty on 10/25/22.   PRECAUTIONS: Other: Standard protocol    WEIGHT BEARING RESTRICTIONS: No  PAIN:  Are you having pain? No  FALLS: Has patient fallen in last 6 months? No  PLOF: Independent  PATIENT GOALS: To be able to use the left hand more.   NEXT MD VISIT: 01/05/23  OBJECTIVE:   HAND DOMINANCE: Right  ADLs: Overall ADLs: Pt is unable to use the left hand for gripping items, opening jars/bottles. Pt uses it to stabilize items at times, has difficulty folding clothes/laundry. Pt with difficulty with toileting/hygiene. Can't open or shut doors.    FUNCTIONAL OUTCOME MEASURES: Quick Dash: 65.91  UPPER EXTREMITY ROM:     Active ROM Left eval  Wrist flexion 30  Wrist extension 42  Wrist ulnar deviation 22  Wrist radial deviation 8  Wrist pronation 90  Wrist supination 90  (Blank rows = not tested)  Active ROM Left eval  Thumb MCP (0-60) 10  Thumb IP (0-80) 40  Thumb Radial abd/add (0-55)    Thumb Palmar abd/add (0-45)    Thumb Opposition to Small Finger  1 cm  Index MCP (0-90)    Index PIP (0-100)    Index DIP (0-70)    Long MCP (0-90)    Long PIP (0-100)    Long DIP (0-70)    Ring MCP (0-90)    Ring PIP (0-100)    Ring DIP (0-70)    Little MCP (0-90)    Little PIP (0-100)    Little DIP (0-70)    (Blank rows = not tested)   UPPER EXTREMITY MMT:     MMT Left eval  Wrist flexion 5/5  Wrist extension 4+/5  Wrist ulnar deviation 4+/5  Wrist radial deviation 4+/5  (Blank rows = not tested)  HAND  FUNCTION: Grip strength: Right: 68 lbs; Left: 23 lbs, Lateral pinch: Right: 14 lbs, Left: 5 lbs, and 3 point pinch: Right: 13 lbs, Left: 3 lbs  EDEMA: Mild edema on observation at 2-4th MCPs  COGNITION: Overall cognitive status: Within functional limits for tasks assessed   TODAY'S TREATMENT:  DATE:   12/27/22 -manual therapy:myofascial release and trigger point applied to thumb, palm, and entire wrist to decrease fascial restrictions and pain and improve ROM.  -Wrist ROM: flexion, extension, ulnar/radial deviation, supination/pronation, x12 -Thumb ROM: opposition, abduction/adduction, flexion/extension, x10 -Digiflex: 3 lb, full squeeze x10, each digit x10 -Gripper: 11#, picking up 10 medium beads, 7# picking up 8 small beads -Sponges: 23, 23  12/20/22 -Wrist ROM: flexion, extension, ulnar/radial deviation, supination/pronation, x10 -Digit ROM: composite flexion, abduction/adduction, opposition, finger tips, x10 -Weighted Stretches: 3lb weight, flexion, extension, supination, 3x20" -Pinch Tree: yellow, red, green resistance clips, tripod pinch up, lateral pinch down, x6 resistance clips -Theraputty: yellow putty, roll into a ball, flatten into a pancake, roll into a log, tripod pinch x10, lateral pinch x10, roll into ball, squeeze x10 -self stretches: flexion, extension, thumb flexion, 3x20"  12/15/22 -Splinting: fabricated left thumb spica splint, wrist at neutral. Strapping to proximal forearm and across hand, thumb post open. Instructed in wear and care.    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 Yes  SHORT TERM GOALS: Target date: 01/14/23  Pt will be provided with and educated on HEP to improve mobility of left hand and thumb required for use as non-dominant during ADLs.   Goal status: IN PROGRESS  2.  Pt will increase left thumb A/ROM by 10+ degrees to improve ability to utilize hand to assist with folding laundry.   Goal status: IN PROGRESS  3.  Pt will increase left wrist A/ROM by 15+ degrees to improve mobility required for reaching and grasping objects.   Goal status: IN PROGRESS  4.  Pt will increase left grip strength by 20# and pinch strength by 4# to improve ability to complete meal preparation tasks incorporating LUE.   Goal status: IN PROGRESS  5.  Pt will decrease pain in left hand to 2/10 or less to improve ability to sleep for 3+ consecutive hours without waking due to pain in the hand.   Goal status: IN PROGRESS  6.  Pt will be educated on splint wear and care to provide support for left hand during post surgical protective healing phase.   Goal status: IN PROGRESS    ASSESSMENT:  CLINICAL IMPRESSION: Pt reported that her dog chewed up her splint, so she has not been able to wear anything lately, causing her hand to hurt more. This session, pt demonstrating improving ROM, however her grip strength continues to be limited. She fatigued quickly with 11lbs of grip and had to down grade to 7lbs due to pain and weakness. OT provided manual therapy to reduce the stiffness and fascial restrictions along the thumb, palm, and wrist. Verbal and tactile cuing provided for positioning and technique throughout session.   PERFORMANCE DEFICITS: in functional skills including ADLs, IADLs, coordination, dexterity, edema, ROM, strength, pain, fascial restrictions, and UE functional use   PLAN:  OT FREQUENCY: 1x/week  OT DURATION: 4 weeks  PLANNED INTERVENTIONS: self care/ADL  training, therapeutic exercise, therapeutic activity, manual therapy, scar mobilization, passive range of motion, splinting, electrical stimulation, ultrasound, moist heat, patient/family education, and DME and/or AE instructions  RECOMMENDED OTHER SERVICES: None at this time  CONSULTED AND AGREED WITH PLAN OF CARE: Patient  PLAN FOR NEXT SESSION: Follow up on splint wear  and adjust if needed, provide edema glove, wrist and digit A/ROM   Trish Mage, OTR/L [phone] 12/28/2022, 8:08 PM

## 2022-12-31 ENCOUNTER — Other Ambulatory Visit: Payer: Self-pay | Admitting: Nurse Practitioner

## 2022-12-31 DIAGNOSIS — E119 Type 2 diabetes mellitus without complications: Secondary | ICD-10-CM

## 2023-01-01 ENCOUNTER — Encounter (HOSPITAL_COMMUNITY): Payer: Medicare HMO | Admitting: Occupational Therapy

## 2023-01-01 ENCOUNTER — Telehealth (HOSPITAL_COMMUNITY): Payer: Self-pay | Admitting: Occupational Therapy

## 2023-01-01 NOTE — Telephone Encounter (Signed)
Called pt regarding no-show for 10/7. Pt forgot about her appt. This is the last scheduled appt, pt wants to wait until she sees her MD on 10/11 before scheduling any additional appts. Pt reports her dog ate half of her splint and she had to go back to the pre-fabricated splint to sleep at night.   Ezra Sites, OTR/L  [phone] 01/01/23

## 2023-01-05 ENCOUNTER — Ambulatory Visit: Payer: Medicare HMO | Admitting: Orthopaedic Surgery

## 2023-01-05 ENCOUNTER — Encounter: Payer: Self-pay | Admitting: Orthopaedic Surgery

## 2023-01-05 DIAGNOSIS — M1812 Unilateral primary osteoarthritis of first carpometacarpal joint, left hand: Secondary | ICD-10-CM

## 2023-01-05 NOTE — Progress Notes (Signed)
Post-Op Visit Note   Patient: [name]           Date of Birth: January 16, 1956           MRN: [id number] Visit Date: 01/05/2023 PCP: Sharon Seller, NP   Assessment & Plan:  Chief Complaint:  Chief Complaint  Patient presents with   Left Hand - Follow-up    Left thumb [facility] arthroplasty 10/26/2022   Visit Diagnoses:  1. Primary osteoarthritis of first carpometacarpal joint of left hand     Plan: Azrael is a little over 22-month status post left thumb CMC arthroplasty on 10/26/2022.  She is doing well overall.  Has some expected postsurgical stiffness.  She has been to 3 sessions of hand therapy.  Exam of the left hand shows fully healed surgical scar.  She has active opposition, palmar abduction, abduction.  She has no tenderness to the surgical scar.  No paresthesias.  Happy with her recovery from the surgery.  At this point I would like her to finish out the remaining 2 sessions of hand therapy and then she can be released to home exercises.  From my standpoint she does not need to have a formal follow-up with me since she is doing so well.  Follow-Up Instructions: No follow-ups on file.   Orders:  No orders of the defined types were placed in this encounter.  No orders of the defined types were placed in this encounter.   Imaging: No results found.  PMFS History: Patient Active Problem List   Diagnosis Date Noted   Primary osteoarthritis of first carpometacarpal joint of left hand 10/25/2022   Pelvic relaxation due to cystocele, midline 10/25/2020   Upper airway cough syndrome 07/30/2018   Morbid (severe) obesity due to excess calories (HCC) 07/30/2018   Depression, major, single episode, moderate (HCC) 10/17/2017   Hypothyroidism 09/19/2017   Mixed stress and urge urinary incontinence 02/10/2014   Diabetes (HCC) 01/18/2014   Hyperlipidemia LDL goal <100 03/10/2013   Hot flashes 07/05/2012   Rectocele 07/05/2012   Essential hypertension 07/04/2012   Joint  stiffness of left lower leg 08/28/2011   Pain in left knee 08/28/2011   Past Medical History:  Diagnosis Date   Arthritis    "fingers occasionally" (08/26/2014)   Asthmatic bronchitis    "haven't used an inhaler in years" (08/26/2014)   Cervical cancer (HCC)    Counseling for estrogen replacement therapy 12/30/2014   Depression    Diabetes mellitus without complication (HCC)    type- 2   Elevated cholesterol    Exertional shortness of breath    GERD (gastroesophageal reflux disease)    Hot flashes 07/05/2012   Hypertension    Hypothyroidism    IFG (impaired fasting glucose)    Mixed stress and urge urinary incontinence    Moody 12/30/2014   Obesity    Panniculus    PONV (postoperative nausea and vomiting)    Rectocele 07/05/2012   Stress incontinence    Superficial fungus infection of skin     Family History  Problem Relation Age of Onset   Heart disease Mother    Stroke Mother    Heart disease Father    Heart attack Father        MI at age 40   Diabetes Brother    Hypertension Brother    Diabetes Brother    Hypertension Brother    Other Brother        Gastrointestional disease   Cancer Maternal Aunt  cervical   Diabetes Maternal Grandmother    Diabetes Paternal Grandmother     Past Surgical History:  Procedure Laterality Date   ABDOMINAL HYSTERECTOMY  1986   "partial" Uterine cancer   ANKLE FUSION Left 10/24/2013   Procedure: LEFT ANKLE SUBTALAR AND TALONAVICULAR FUSION;  Surgeon: Nadara Mustard, MD;  [facility]: [facility];  Service: Orthopedics;  Laterality: Left;   ANKLE FUSION Left 08/26/2014   Procedure: Left Foot Take Down Non-union with Revision Talonavicular and Subtalar Fusion;  Surgeon: Nadara Mustard, MD;  Location: [facility];  Service: Orthopedics;  Laterality: Left;   BACK SURGERY     BILATERAL SALPINGECTOMY  2010   w/LOA   CARPOMETACARPEL SUSPENSION PLASTY Left 10/25/2022   Procedure: LEFT THUMB CARPOMETACARPAL  ARTHROSPLASTY;  Surgeon: Tarry Kos, MD;   Location: [facility];  Service: Orthopedics;  Laterality: Left;   COLONOSCOPY     COLONOSCOPY N/A 07/14/2016   Procedure: COLONOSCOPY;  Surgeon: West Bali, MD;  [facility];  Service: Endoscopy;  Laterality: N/A;  8:30   FUSION OF TALONAVICULAR JOINT Left    Take Down Non-union with Revision Talonavicular and Subtalar Fusion /notes 08/26/2014   HAMMER TOE SURGERY Bilateral 2000-2013   right-left   JOINT REPLACEMENT     Right knee   KNEE ARTHROSCOPY Bilateral 2008-2010    left-right   LUMBAR DISC SURGERY  2004   Lumbar 4- 5   POLYPECTOMY  07/14/2016   Procedure: POLYPECTOMY;  Surgeon: West Bali, MD;  [facility];  Service: Endoscopy;;  hepatic flexure   SHOULDER ARTHROSCOPY WITH SUBACROMIAL DECOMPRESSION, ROTATOR CUFF REPAIR AND BICEP TENDON REPAIR Right 02/11/2019   Procedure: right shoulde arthroscopy, biceps tenodesis lower trapezius tendon transfer;  Surgeon: Cammy Copa, MD;  Location: [facility];  Service: Orthopedics;  Laterality: Right;   TOTAL KNEE ARTHROPLASTY Right 2013   TUBAL LIGATION     Social History   Occupational History   Not on file  Tobacco Use   Smoking status: Former    Current packs/day: 0.00    Average packs/day: 0.5 packs/day for 10.0 years (5.0 ttl pk-yrs)    Types: Cigarettes    Start date: 03/27/1978    Quit date: 03/27/1988    Years since quitting: 34.8   Smokeless tobacco: Never  Vaping Use   Vaping status: Never Used  Substance and Sexual Activity   Alcohol use: Yes    Comment: occasionally a beer   Drug use: No   Sexual activity: Not Currently    Birth control/protection: Surgical    Comment: hyst

## 2023-01-14 ENCOUNTER — Other Ambulatory Visit: Payer: Self-pay | Admitting: Nurse Practitioner

## 2023-01-15 NOTE — Telephone Encounter (Signed)
Due to medication name variation, I will send to Sharon Seller, NP to confirm rx's the same

## 2023-01-26 ENCOUNTER — Ambulatory Visit: Payer: Medicare HMO | Admitting: Nurse Practitioner

## 2023-02-13 ENCOUNTER — Encounter: Payer: Self-pay | Admitting: Orthopaedic Surgery

## 2023-02-13 ENCOUNTER — Other Ambulatory Visit (INDEPENDENT_AMBULATORY_CARE_PROVIDER_SITE_OTHER): Payer: Medicare HMO

## 2023-02-13 ENCOUNTER — Ambulatory Visit: Payer: Medicare HMO | Admitting: Orthopaedic Surgery

## 2023-02-13 DIAGNOSIS — M65311 Trigger thumb, right thumb: Secondary | ICD-10-CM | POA: Diagnosis not present

## 2023-02-13 MED ORDER — BUPIVACAINE HCL 0.5 % IJ SOLN
0.3300 mL | INTRAMUSCULAR | Status: AC | PRN
Start: 2023-02-13 — End: 2023-02-13
  Administered 2023-02-13: .33 mL

## 2023-02-13 MED ORDER — METHYLPREDNISOLONE ACETATE 40 MG/ML IJ SUSP
13.3300 mg | INTRAMUSCULAR | Status: AC | PRN
Start: 2023-02-13 — End: 2023-02-13
  Administered 2023-02-13: 13.33 mg

## 2023-02-13 MED ORDER — LIDOCAINE HCL 1 % IJ SOLN
0.3000 mL | INTRAMUSCULAR | Status: AC | PRN
Start: 2023-02-13 — End: 2023-02-13
  Administered 2023-02-13: .3 mL

## 2023-02-13 NOTE — Progress Notes (Signed)
Office Visit Note   Patient: [name]           Date of Birth: 06-01-1955           MRN: [id number] Visit Date: 02/13/2023              Requested by: Sharon Seller, NP [address] [place]. [place],  [place] [address] PCP: Sharon Seller, NP   Assessment & Plan: Visit Diagnoses:  1. Trigger thumb, right thumb     Plan: [name] is a 67 year old female with right trigger thumb.  She has a fair amount of DJD of the thumb CMC joint but she does not seem to be symptomatic from this.  The trigger thumb is what is bothering her more.  We will go ahead and do a cortisone injection today.  I recommend an over-the-counter trigger finger splint to wear over the next couple weeks.  She will follow-up if symptoms do not improve.  Follow-Up Instructions: No follow-ups on file.   Orders:  Orders Placed This Encounter  Procedures   Hand/UE Inj   XR Hand Complete Right   No orders of the defined types were placed in this encounter.     Procedures: Hand/UE Inj: R thumb A1 for trigger finger on 02/13/2023 4:45 PM Indications: pain Details: 25 G needle Medications: 0.3 mL lidocaine 1 %; 0.33 mL bupivacaine 0.5 %; 13.33 mg methylPREDNISolone acetate 40 MG/ML Outcome: tolerated well, no immediate complications Consent was given by the patient. Patient was prepped and draped in the usual sterile fashion.       Clinical Data: No additional findings.   Subjective: Chief Complaint  Patient presents with   Right Hand - Pain    Thumb pain    HPI [name] is a 67 year old female comes in for right thumb pain and locking for about a month.  Denies any injuries.  Feels a tender nodule at the base of the thumb.  Review of Systems  Constitutional: Negative.   HENT: Negative.    Eyes: Negative.   Respiratory: Negative.    Cardiovascular: Negative.   Endocrine: Negative.   Musculoskeletal: Negative.   Neurological: Negative.   Hematological: Negative.   Psychiatric/Behavioral:  Negative.    All other systems reviewed and are negative.    Objective: Vital Signs: There were no vitals taken for this visit.  Physical Exam Vitals and nursing note reviewed.  Constitutional:      Appearance: She is well-developed.  HENT:     Head: Atraumatic.     Nose: Nose normal.  Eyes:     Extraocular Movements: Extraocular movements intact.  Cardiovascular:     Pulses: Normal pulses.  Pulmonary:     Effort: Pulmonary effort is normal.  Abdominal:     Palpations: Abdomen is soft.  Musculoskeletal:     Cervical back: Neck supple.  Skin:    General: Skin is warm.     Capillary Refill: Capillary refill takes less than 2 seconds.  Neurological:     Mental Status: She is alert. Mental status is at baseline.  Psychiatric:        Behavior: Behavior normal.        Thought Content: Thought content normal.        Judgment: Judgment normal.     Ortho Exam Exam of the right thumb shows a tender nodule at the A1 pulley.  There is locking and triggering.  There is no real symptoms with a CMC grind test. Specialty Comments:  No specialty comments available.  Imaging: XR Hand Complete Right  Result Date: 02/13/2023 X-rays of the right hand show is advanced degenerative changes of the thumb [facility] joint with radial subluxation of the thumb metacarpal.    PMFS History: Patient Active Problem List   Diagnosis Date Noted   Primary osteoarthritis of first carpometacarpal joint of left hand 10/25/2022   Pelvic relaxation due to cystocele, midline 10/25/2020   Upper airway cough syndrome 07/30/2018   Morbid (severe) obesity due to excess calories (HCC) 07/30/2018   Depression, major, single episode, moderate (HCC) 10/17/2017   Hypothyroidism 09/19/2017   Mixed stress and urge urinary incontinence 02/10/2014   Diabetes (HCC) 01/18/2014   Hyperlipidemia LDL goal <100 03/10/2013   Hot flashes 07/05/2012   Rectocele 07/05/2012   Essential hypertension 07/04/2012   Joint  stiffness of left lower leg 08/28/2011   Pain in left knee 08/28/2011   Past Medical History:  Diagnosis Date   Arthritis    "fingers occasionally" (08/26/2014)   Asthmatic bronchitis    "haven't used an inhaler in years" (08/26/2014)   Cervical cancer (HCC)    Counseling for estrogen replacement therapy 12/30/2014   Depression    Diabetes mellitus without complication (HCC)    type- 2   Elevated cholesterol    Exertional shortness of breath    GERD (gastroesophageal reflux disease)    Hot flashes 07/05/2012   Hypertension    Hypothyroidism    IFG (impaired fasting glucose)    Mixed stress and urge urinary incontinence    Moody 12/30/2014   Obesity    Panniculus    PONV (postoperative nausea and vomiting)    Rectocele 07/05/2012   Stress incontinence    Superficial fungus infection of skin     Family History  Problem Relation Age of Onset   Heart disease Mother    Stroke Mother    Heart disease Father    Heart attack Father        MI at age 88   Diabetes Brother    Hypertension Brother    Diabetes Brother    Hypertension Brother    Other Brother        Gastrointestional disease   Cancer Maternal Aunt        cervical   Diabetes Maternal Grandmother    Diabetes Paternal Grandmother     Past Surgical History:  Procedure Laterality Date   ABDOMINAL HYSTERECTOMY  1986   "partial" Uterine cancer   ANKLE FUSION Left 10/24/2013   Procedure: LEFT ANKLE SUBTALAR AND TALONAVICULAR FUSION;  Surgeon: Nadara Mustard, MD;  [facility]: [facility];  Service: Orthopedics;  Laterality: Left;   ANKLE FUSION Left 08/26/2014   Procedure: Left Foot Take Down Non-union with Revision Talonavicular and Subtalar Fusion;  Surgeon: Nadara Mustard, MD;  [facility]: [facility];  Service: Orthopedics;  Laterality: Left;   BACK SURGERY     BILATERAL SALPINGECTOMY  2010   w/LOA   CARPOMETACARPEL SUSPENSION PLASTY Left 10/25/2022   Procedure: LEFT THUMB CARPOMETACARPAL  ARTHROSPLASTY;  Surgeon: Tarry Kos, MD;   Location: [facility];  Service: Orthopedics;  Laterality: Left;   COLONOSCOPY     COLONOSCOPY N/A 07/14/2016   Procedure: COLONOSCOPY;  Surgeon: West Bali, MD;  [facility];  Service: Endoscopy;  Laterality: N/A;  8:30   FUSION OF TALONAVICULAR JOINT Left    Take Down Non-union with Revision Talonavicular and Subtalar Fusion /notes 08/26/2014   HAMMER TOE SURGERY Bilateral 2000-2013  right-left   JOINT REPLACEMENT     Right knee   KNEE ARTHROSCOPY Bilateral 2008-2010    left-right   LUMBAR DISC SURGERY  2004   Lumbar 4- 5   POLYPECTOMY  07/14/2016   Procedure: POLYPECTOMY;  Surgeon: West Bali, MD;  [facility];  Service: Endoscopy;;  hepatic flexure   SHOULDER ARTHROSCOPY WITH SUBACROMIAL DECOMPRESSION, ROTATOR CUFF REPAIR AND BICEP TENDON REPAIR Right 02/11/2019   Procedure: right shoulde arthroscopy, biceps tenodesis lower trapezius tendon transfer;  Surgeon: Cammy Copa, MD;  Location: [facility] OR;  Service: Orthopedics;  Laterality: Right;   TOTAL KNEE ARTHROPLASTY Right 2013   TUBAL LIGATION     Social History   Occupational History   Not on file  Tobacco Use   Smoking status: Former    Current packs/day: 0.00    Average packs/day: 0.5 packs/day for 10.0 years (5.0 ttl pk-yrs)    Types: Cigarettes    Start date: 03/27/1978    Quit date: 03/27/1988    Years since quitting: 34.9   Smokeless tobacco: Never  Vaping Use   Vaping status: Never Used  Substance and Sexual Activity   Alcohol use: Yes    Comment: occasionally a beer   Drug use: No   Sexual activity: Not Currently    Birth control/protection: Surgical    Comment: hyst

## 2023-03-26 ENCOUNTER — Ambulatory Visit: Payer: Medicare HMO | Admitting: Nurse Practitioner

## 2023-03-26 NOTE — Progress Notes (Signed)
This encounter was created in error - please disregard.

## 2023-03-30 ENCOUNTER — Encounter: Payer: Self-pay | Admitting: Nurse Practitioner

## 2023-03-30 ENCOUNTER — Ambulatory Visit (INDEPENDENT_AMBULATORY_CARE_PROVIDER_SITE_OTHER): Payer: Medicare HMO | Admitting: Nurse Practitioner

## 2023-03-30 VITALS — BP 128/84 | HR 70 | Temp 97.9°F | Ht 62.5 in | Wt 234.0 lb

## 2023-03-30 DIAGNOSIS — E785 Hyperlipidemia, unspecified: Secondary | ICD-10-CM | POA: Diagnosis not present

## 2023-03-30 DIAGNOSIS — E039 Hypothyroidism, unspecified: Secondary | ICD-10-CM

## 2023-03-30 DIAGNOSIS — I10 Essential (primary) hypertension: Secondary | ICD-10-CM | POA: Diagnosis not present

## 2023-03-30 DIAGNOSIS — E1169 Type 2 diabetes mellitus with other specified complication: Secondary | ICD-10-CM

## 2023-03-30 MED ORDER — SEMAGLUTIDE(0.25 OR 0.5MG/DOS) 2 MG/3ML ~~LOC~~ SOPN: 0.2500 mg | PEN_INJECTOR | SUBCUTANEOUS | 1 refills | Status: DC

## 2023-03-30 NOTE — Patient Instructions (Addendum)
 Increase fiber and protein in diet.   Take a food journal  Schedule mammogram  Add benefiber daily to drink of choice.

## 2023-03-30 NOTE — Progress Notes (Signed)
 Careteam: Patient Care Team: Caro Harlene POUR, NP as PCP - General (Geriatric Medicine) Alvan Dorn FALCON, MD as PCP - Cardiology (Cardiology) Darroll Anes, DO (Optometry)  PLACE OF SERVICE:  [facility]  Advanced Directive information Does Patient Have a Medical Advance Directive?: No, Would patient like information on creating a medical advance directive?: No - Patient declined  Allergies  Allergen Reactions   Naprosyn [Naproxen] Nausea Only    Can take Alleve but years ago patient took a liquid form of this medication and it made me nauseated    Chief Complaint  Patient presents with   Medical Management of Chronic Issues    4 month follow-up. Discuss need for coivd booster, mammogram, and A1c. Discuss Ozempic       HPI: Patient is a 68 y.o. female for routine follow up.  She had a lot of illness over the holidays, nasal congestion, etc.   She has been eating a lot of oranges, apples and grapes. Reports her blood sugar is in the 200s  Eating some cakes and holiday food.  On metformin  for diabetes only- off ozempic .   She is having a lot of gas  GERD- controlled on nexium  She is due for colonoscopy and mammogram   Review of Systems:  Review of Systems  Constitutional:  Negative for chills, fever and weight loss.  HENT:  Negative for tinnitus.   Respiratory:  Negative for cough, sputum production and shortness of breath.   Cardiovascular:  Negative for chest pain, palpitations and leg swelling.  Gastrointestinal:  Negative for abdominal pain, constipation, diarrhea and heartburn.  Genitourinary:  Negative for dysuria, frequency and urgency.  Musculoskeletal:  Negative for back pain, falls, joint pain and myalgias.  Skin: Negative.   Neurological:  Negative for dizziness and headaches.  Psychiatric/Behavioral:  Negative for depression and memory loss. The patient does not have insomnia.     Past Medical History:  Diagnosis Date   Arthritis    fingers  occasionally (08/26/2014)   Asthmatic bronchitis    haven't used an inhaler in years (08/26/2014)   Cervical cancer (HCC)    Counseling for estrogen replacement therapy 12/30/2014   Depression    Diabetes mellitus without complication (HCC)    type- 2   Elevated cholesterol    Exertional shortness of breath    GERD (gastroesophageal reflux disease)    Hot flashes 07/05/2012   Hypertension    Hypothyroidism    IFG (impaired fasting glucose)    Mixed stress and urge urinary incontinence    Moody 12/30/2014   Obesity    Panniculus    PONV (postoperative nausea and vomiting)    Rectocele 07/05/2012   Stress incontinence    Superficial fungus infection of skin    Past Surgical History:  Procedure Laterality Date   ABDOMINAL HYSTERECTOMY  1986   partial Uterine cancer   ANKLE FUSION Left 10/24/2013   Procedure: LEFT ANKLE SUBTALAR AND TALONAVICULAR FUSION;  Surgeon: Jerona Harden GAILS, MD;  [facility]: [facility];  Service: Orthopedics;  Laterality: Left;   ANKLE FUSION Left 08/26/2014   Procedure: Left Foot Take Down Non-union with Revision Talonavicular and Subtalar Fusion;  Surgeon: Jerona Harden GAILS, MD;  [facility]: [facility];  Service: Orthopedics;  Laterality: Left;   BACK SURGERY     BILATERAL SALPINGECTOMY  2010   w/LOA   CARPOMETACARPEL SUSPENSION PLASTY Left 10/25/2022   Procedure: LEFT THUMB CARPOMETACARPAL  ARTHROSPLASTY;  Surgeon: Jerri Kay HERO, MD;  Location: [facility];  Service: Orthopedics;  Laterality: Left;   COLONOSCOPY     COLONOSCOPY N/A 07/14/2016   Procedure: COLONOSCOPY;  Surgeon: Margo LITTIE Haddock, MD;  [facility];  Service: Endoscopy;  Laterality: N/A;  8:30   FUSION OF TALONAVICULAR JOINT Left    Take Down Non-union with Revision Talonavicular and Subtalar Fusion /notes 08/26/2014   HAMMER TOE SURGERY Bilateral 2000-2013   right-left   JOINT REPLACEMENT     Right knee   KNEE ARTHROSCOPY Bilateral 2008-2010    left-right   LUMBAR DISC SURGERY  2004    Lumbar 4- 5   POLYPECTOMY  07/14/2016   Procedure: POLYPECTOMY;  Surgeon: Margo LITTIE Haddock, MD;  [facility];  Service: Endoscopy;;  hepatic flexure   SHOULDER ARTHROSCOPY WITH SUBACROMIAL DECOMPRESSION, ROTATOR CUFF REPAIR AND BICEP TENDON REPAIR Right 02/11/2019   Procedure: right shoulde arthroscopy, biceps tenodesis lower trapezius tendon transfer;  Surgeon: Addie Cordella Hamilton, MD;  Location: [facility];  Service: Orthopedics;  Laterality: Right;   TOTAL KNEE ARTHROPLASTY Right 2013   TUBAL LIGATION     Social History:   reports that she quit smoking about 35 years ago. Her smoking use included cigarettes. She started smoking about 45 years ago. She has a 5 pack-year smoking history. She has never used smokeless tobacco. She reports current alcohol use. She reports that she does not use drugs.  Family History  Problem Relation Age of Onset   Heart disease Mother    Stroke Mother    Heart disease Father    Heart attack Father        MI at age 42   Diabetes Brother    Hypertension Brother    Diabetes Brother    Hypertension Brother    Other Brother        Gastrointestional disease   Cancer Maternal Aunt        cervical   Diabetes Maternal Grandmother    Diabetes Paternal Grandmother     Medications: Patient's Medications  New Prescriptions   No medications on file  Previous Medications   ACETAMINOPHEN  (TYLENOL ) 500 MG TABLET    Take 500 mg by mouth as needed.   CALCIUM  CARBONATE (CALCIUM  600) 600 MG TABS TABLET    Take 1 tablet (600 mg total) by mouth 2 (two) times daily with a meal.   CHOLECALCIFEROL (VITAMIN D3) 50 MCG (2000 UT) CAPSULE    Take 1 capsule (2,000 Units total) by mouth daily.   ESOMEPRAZOLE (NEXIUM) 20 MG PACKET    Take 20 mg by mouth daily.   GLUCOSE BLOOD (ONETOUCH ULTRA) TEST STRIP    Use to check blood sugar twice daily. Dx: E11.9   HYDROCODONE -ACETAMINOPHEN  (NORCO) 5-325 MG TABLET    Take 1 tablet by mouth 3 (three) times daily as needed.    METFORMIN  (GLUCOPHAGE ) 500 MG TABLET    Take 2 tablets by mouth twice daily   NP THYROID  60 MG TABLET    TAKE 1 TABLET BY MOUTH DAILY BEFORE BREAKFAST   NYSTATIN  (MYCOSTATIN /NYSTOP ) POWDER    APPLY TO AFFECTED AREA TWICE A DAY   ONETOUCH DELICA LANCETS 33G MISC    Use to check blood sugar twice daily. Dx: E11.9   ROSUVASTATIN  (CRESTOR ) 20 MG TABLET    TAKE 1 TABLET BY MOUTH EVERY DAY   SEMAGLUTIDE ,0.25 OR 0.5MG /DOS, 2 MG/3ML SOPN    Inject 0.25 mg into the skin once a week.   TELMISARTAN -HYDROCHLOROTHIAZIDE  (MICARDIS  HCT) 40-12.5 MG TABLET  TAKE 1 TABLET BY MOUTH EVERY DAY  Modified Medications   No medications on file  Discontinued Medications   No medications on file    Physical Exam:  Vitals:   03/30/23 1338  BP: 128/84  Pulse: 70  Temp: 97.9 F (36.6 C)  TempSrc: Temporal  SpO2: 97%  Weight: 234 lb (106.1 kg)  Height: 5' 2.5 (1.588 m)   Body mass index is 42.12 kg/m. Wt Readings from Last 3 Encounters:  03/30/23 234 lb (106.1 kg)  11/06/22 230 lb (104.3 kg)  10/25/22 226 lb 13.7 oz (102.9 kg)    Physical Exam Constitutional:      General: She is not in acute distress.    Appearance: She is well-developed. She is not diaphoretic.  HENT:     Head: Normocephalic and atraumatic.     Mouth/Throat:     Pharynx: No oropharyngeal exudate.  Eyes:     Conjunctiva/sclera: Conjunctivae normal.     Pupils: Pupils are equal, round, and reactive to light.  Cardiovascular:     Rate and Rhythm: Normal rate and regular rhythm.     Heart sounds: Normal heart sounds.  Pulmonary:     Effort: Pulmonary effort is normal.     Breath sounds: Normal breath sounds.  Abdominal:     General: Bowel sounds are normal.     Palpations: Abdomen is soft.  Musculoskeletal:     Cervical back: Normal range of motion and neck supple.     Right lower leg: No edema.     Left lower leg: No edema.  Skin:    General: Skin is warm and dry.  Neurological:     Mental Status: She is alert.   Psychiatric:        Mood and Affect: Mood normal.     Labs reviewed: Basic Metabolic Panel: Recent Labs    05/22/22 1527 09/19/22 0807 10/20/22 1347  NA 138 138 137  K 4.1 4.3 4.9  CL 104 104 101  CO2 25 24 24   GLUCOSE 161* 124* 129*  BUN 26* 30* 22  CREATININE 0.93 1.06* 0.93  CALCIUM  9.5 9.8 9.4  TSH  --  7.25*  --    Liver Function Tests: Recent Labs    05/22/22 1527 09/19/22 0807  AST 24 17  ALT 15 12  BILITOT 0.3 0.3  PROT 7.0 7.1   No results for input(s): LIPASE, AMYLASE in the last 8760 hours. No results for input(s): AMMONIA in the last 8760 hours. CBC: Recent Labs    05/22/22 1527  WBC 9.1  NEUTROABS 5,396  HGB 12.5  HCT 37.0  MCV 88.3  PLT 283   Lipid Panel: Recent Labs    09/19/22 0807  CHOL 159  HDL 58  LDLCALC 81  TRIG 103  CHOLHDL 2.7   TSH: Recent Labs    09/19/22 0807  TSH 7.25*   A1C: Lab Results  Component Value Date   HGBA1C 6.3 (H) 09/22/2022     Assessment/Plan 1. Type 2 diabetes mellitus with other specified complication, without long-term current use of insulin (HCC) (Primary) -off ozempic  at this time -Encouraged dietary compliance, routine foot care/monitoring and to keep up with diabetic eye exams through ophthalmology  -continues on metformin  BID but will add ozempeic back to regimen due to elevated blood sugars - Hemoglobin A1c - Semaglutide ,0.25 or 0.5MG /DOS, 2 MG/3ML SOPN; Inject 0.25 mg into the skin once a week.  Dispense: 9 mL; Refill: 1  2. Hypothyroidism, unspecified type -continues on  np thyroid   - TSH  3. Hyperlipidemia LDL goal <70 -continues on crestor  20 mg daily - Lipid panel  4. Morbid (severe) obesity due to excess calories (HCC) --education provided on healthy weight loss through increase in physical activity and proper nutrition   5. Essential hypertension -Blood pressure well controlled, goal bp <140/90 Continue current medications and dietary modifications follow metabolic  panel - COMPLETE METABOLIC PANEL WITH GFR - CBC with Differential/Platelet   4 month for routine follow up   Clent Damore K. Caro BODILY [facility] [phone]

## 2023-03-31 LAB — COMPLETE METABOLIC PANEL WITH GFR
AG Ratio: 1.6 (calc) (ref 1.0–2.5)
ALT: 16 U/L (ref 6–29)
AST: 22 U/L (ref 10–35)
Albumin: 4.4 g/dL (ref 3.6–5.1)
Alkaline phosphatase (APISO): 72 U/L (ref 37–153)
BUN: 22 mg/dL (ref 7–25)
CO2: 24 mmol/L (ref 20–32)
Calcium: 9.5 mg/dL (ref 8.6–10.4)
Chloride: 102 mmol/L (ref 98–110)
Creat: 0.91 mg/dL (ref 0.50–1.05)
Globulin: 2.8 g/dL (ref 1.9–3.7)
Glucose, Bld: 99 mg/dL (ref 65–139)
Potassium: 4.7 mmol/L (ref 3.5–5.3)
Sodium: 137 mmol/L (ref 135–146)
Total Bilirubin: 0.3 mg/dL (ref 0.2–1.2)
Total Protein: 7.2 g/dL (ref 6.1–8.1)
eGFR: 70 mL/min/{1.73_m2} (ref >=60)

## 2023-03-31 LAB — CBC WITH DIFFERENTIAL/PLATELET
Absolute Lymphocytes: 2321 {cells}/uL (ref 850–3900)
Absolute Monocytes: 730 {cells}/uL (ref 200–950)
Basophils Absolute: 90 {cells}/uL (ref 0–200)
Basophils Relative: 1.1 %
Eosinophils Absolute: 492 {cells}/uL (ref 15–500)
Eosinophils Relative: 6 %
HCT: 37.7 % (ref 35.0–45.0)
Hemoglobin: 12.2 g/dL (ref 11.7–15.5)
MCH: 29.3 pg (ref 27.0–33.0)
MCHC: 32.4 g/dL (ref 32.0–36.0)
MCV: 90.6 fL (ref 80.0–100.0)
MPV: 10.1 fL (ref 7.5–12.5)
Monocytes Relative: 8.9 %
Neutro Abs: 4567 {cells}/uL (ref 1500–7800)
Neutrophils Relative %: 55.7 %
Platelets: 322 10*3/uL (ref 140–400)
RBC: 4.16 10*6/uL (ref 3.80–5.10)
RDW: 12.9 % (ref 11.0–15.0)
Total Lymphocyte: 28.3 %
WBC: 8.2 10*3/uL (ref 3.8–10.8)

## 2023-03-31 LAB — LIPID PANEL
Cholesterol: 171 mg/dL (ref <200)
HDL: 50 mg/dL (ref >=50)
LDL Cholesterol (Calc): 88 mg/dL
Non-HDL Cholesterol (Calc): 121 mg/dL (ref <130)
Total CHOL/HDL Ratio: 3.4 (calc) (ref <5.0)
Triglycerides: 253 mg/dL — ABNORMAL HIGH (ref <150)

## 2023-03-31 LAB — HEMOGLOBIN A1C
Hgb A1c MFr Bld: 7.6 %{Hb} — ABNORMAL HIGH (ref <5.7)
Mean Plasma Glucose: 171 mg/dL
eAG (mmol/L): 9.5 mmol/L

## 2023-03-31 LAB — TSH: TSH: 2.61 m[IU]/L (ref 0.40–4.50)

## 2023-04-15 ENCOUNTER — Other Ambulatory Visit: Payer: Self-pay | Admitting: Nurse Practitioner

## 2023-04-15 DIAGNOSIS — E119 Type 2 diabetes mellitus without complications: Secondary | ICD-10-CM

## 2023-04-22 NOTE — Progress Notes (Unsigned)
Office Visit Note   Patient: [name]           Date of Birth: 09-09-1955           MRN: [id number] Visit Date: 04/24/2023              Requested by: Sharon Seller, NP [address] [place]. [place],  [place] [address] PCP: Sharon Seller, NP   Assessment & Plan: Visit Diagnoses: No diagnosis found.  Plan: ***  Follow-Up Instructions: No follow-ups on file.   Orders:  No orders of the defined types were placed in this encounter.  No orders of the defined types were placed in this encounter.     Procedures: No procedures performed   Clinical Data: No additional findings.   Subjective: No chief complaint on file.   HPI  Review of Systems  Constitutional: Negative.   HENT: Negative.    Eyes: Negative.   Respiratory: Negative.    Cardiovascular: Negative.   Endocrine: Negative.   Musculoskeletal: Negative.   Neurological: Negative.   Hematological: Negative.   Psychiatric/Behavioral: Negative.    All other systems reviewed and are negative.   Objective: Vital Signs: There were no vitals taken for this visit.  Physical Exam Vitals and nursing note reviewed.  Constitutional:      Appearance: She is well-developed.  HENT:     Head: Atraumatic.     Nose: Nose normal.  Eyes:     Extraocular Movements: Extraocular movements intact.  Cardiovascular:     Pulses: Normal pulses.  Pulmonary:     Effort: Pulmonary effort is normal.  Abdominal:     Palpations: Abdomen is soft.  Musculoskeletal:     Cervical back: Neck supple.  Skin:    General: Skin is warm.     Capillary Refill: Capillary refill takes less than 2 seconds.  Neurological:     Mental Status: She is alert. Mental status is at baseline.  Psychiatric:        Behavior: Behavior normal.        Thought Content: Thought content normal.        Judgment: Judgment normal.   Ortho Exam  Specialty Comments:  No specialty comments available.  Imaging: No results found.   PMFS  History: Patient Active Problem List   Diagnosis Date Noted  . Primary osteoarthritis of first carpometacarpal joint of left hand 10/25/2022  . Pelvic relaxation due to cystocele, midline 10/25/2020  . Upper airway cough syndrome 07/30/2018  . Morbid (severe) obesity due to excess calories (HCC) 07/30/2018  . Depression, major, single episode, moderate (HCC) 10/17/2017  . Hypothyroidism 09/19/2017  . Mixed stress and urge urinary incontinence 02/10/2014  . Diabetes (HCC) 01/18/2014  . Hyperlipidemia LDL goal <100 03/10/2013  . Hot flashes 07/05/2012  . Rectocele 07/05/2012  . Essential hypertension 07/04/2012  . Joint stiffness of left lower leg 08/28/2011  . Pain in left knee 08/28/2011   Past Medical History:  Diagnosis Date  . Arthritis    "fingers occasionally" (08/26/2014)  . Asthmatic bronchitis    "haven't used an inhaler in years" (08/26/2014)  . Cervical cancer (HCC)   . Counseling for estrogen replacement therapy 12/30/2014  . Depression   . Diabetes mellitus without complication (HCC)    type- 2  . Elevated cholesterol   . Exertional shortness of breath   . GERD (gastroesophageal reflux disease)   . Hot flashes 07/05/2012  . Hypertension   . Hypothyroidism   . IFG (impaired  fasting glucose)   . Mixed stress and urge urinary incontinence   . Moody 12/30/2014  . Obesity   . Panniculus   . PONV (postoperative nausea and vomiting)   . Rectocele 07/05/2012  . Stress incontinence   . Superficial fungus infection of skin     Family History  Problem Relation Age of Onset  . Heart disease Mother   . Stroke Mother   . Heart disease Father   . Heart attack Father        MI at age 30  . Diabetes Brother   . Hypertension Brother   . Diabetes Brother   . Hypertension Brother   . Other Brother        Gastrointestional disease  . Cancer Maternal Aunt        cervical  . Diabetes Maternal Grandmother   . Diabetes Paternal Grandmother     Past Surgical History:   Procedure Laterality Date  . ABDOMINAL HYSTERECTOMY  1986   "partial" Uterine cancer  . ANKLE FUSION Left 10/24/2013   Procedure: LEFT ANKLE SUBTALAR AND TALONAVICULAR FUSION;  Surgeon: Nadara Mustard, MD;  Location: [facility];  Service: Orthopedics;  Laterality: Left;  . ANKLE FUSION Left 08/26/2014   Procedure: Left Foot Take Down Non-union with Revision Talonavicular and Subtalar Fusion;  Surgeon: Nadara Mustard, MD;  Location: [facility] OR;  Service: Orthopedics;  Laterality: Left;  . BACK SURGERY    . BILATERAL SALPINGECTOMY  2010   w/LOA  . CARPOMETACARPEL SUSPENSION PLASTY Left 10/25/2022   Procedure: LEFT THUMB CARPOMETACARPAL  ARTHROSPLASTY;  Surgeon: Tarry Kos, MD;  Location: [facility];  Service: Orthopedics;  Laterality: Left;  . COLONOSCOPY    . COLONOSCOPY N/A 07/14/2016   Procedure: COLONOSCOPY;  Surgeon: West Bali, MD;  [facility]: [facility];  Service: Endoscopy;  Laterality: N/A;  8:30  . FUSION OF TALONAVICULAR JOINT Left    Take Down Non-union with Revision Talonavicular and Subtalar Fusion /notes 08/26/2014  . HAMMER TOE SURGERY Bilateral 2000-2013   right-left  . JOINT REPLACEMENT     Right knee  . KNEE ARTHROSCOPY Bilateral 2008-2010    left-right  . LUMBAR DISC SURGERY  2004   Lumbar 4- 5  . POLYPECTOMY  07/14/2016   Procedure: POLYPECTOMY;  Surgeon: West Bali, MD;  [facility]: [facility];  Service: Endoscopy;;  hepatic flexure  . SHOULDER ARTHROSCOPY WITH SUBACROMIAL DECOMPRESSION, ROTATOR CUFF REPAIR AND BICEP TENDON REPAIR Right 02/11/2019   Procedure: right shoulde arthroscopy, biceps tenodesis lower trapezius tendon transfer;  Surgeon: Cammy Copa, MD;  Location: [facility] OR;  Service: Orthopedics;  Laterality: Right;  . TOTAL KNEE ARTHROPLASTY Right 2013  . TUBAL LIGATION     Social History   Occupational History  . Not on file  Tobacco Use  . Smoking status: Former    Current packs/day: 0.00    Average packs/day: 0.5 packs/day for  10.0 years (5.0 ttl pk-yrs)    Types: Cigarettes    Start date: 03/27/1978    Quit date: 03/27/1988    Years since quitting: 35.0  . Smokeless tobacco: Never  Vaping Use  . Vaping status: Never Used  Substance and Sexual Activity  . Alcohol use: Yes    Comment: occasionally a beer  . Drug use: No  . Sexual activity: Not Currently    Birth control/protection: Surgical    Comment: hyst

## 2023-04-24 ENCOUNTER — Encounter: Payer: Self-pay | Admitting: Orthopaedic Surgery

## 2023-04-24 ENCOUNTER — Other Ambulatory Visit (INDEPENDENT_AMBULATORY_CARE_PROVIDER_SITE_OTHER): Payer: Medicare HMO

## 2023-04-24 ENCOUNTER — Ambulatory Visit: Payer: Medicare HMO | Admitting: Orthopaedic Surgery

## 2023-04-24 ENCOUNTER — Other Ambulatory Visit (INDEPENDENT_AMBULATORY_CARE_PROVIDER_SITE_OTHER): Payer: Self-pay

## 2023-04-24 DIAGNOSIS — M25571 Pain in right ankle and joints of right foot: Secondary | ICD-10-CM

## 2023-04-24 MED ORDER — DICLOFENAC SODIUM 75 MG PO TBEC: 75.0000 mg | DELAYED_RELEASE_TABLET | Freq: Two times a day (BID) | ORAL | 2 refills | Status: DC

## 2023-05-16 DIAGNOSIS — H2510 Age-related nuclear cataract, unspecified eye: Secondary | ICD-10-CM | POA: Diagnosis not present

## 2023-05-16 DIAGNOSIS — H35369 Drusen (degenerative) of macula, unspecified eye: Secondary | ICD-10-CM | POA: Diagnosis not present

## 2023-05-16 DIAGNOSIS — E119 Type 2 diabetes mellitus without complications: Secondary | ICD-10-CM | POA: Diagnosis not present

## 2023-05-16 DIAGNOSIS — H524 Presbyopia: Secondary | ICD-10-CM | POA: Diagnosis not present

## 2023-06-14 DIAGNOSIS — H2513 Age-related nuclear cataract, bilateral: Secondary | ICD-10-CM | POA: Diagnosis not present

## 2023-06-14 DIAGNOSIS — H353132 Nonexudative age-related macular degeneration, bilateral, intermediate dry stage: Secondary | ICD-10-CM | POA: Diagnosis not present

## 2023-06-14 DIAGNOSIS — H43813 Vitreous degeneration, bilateral: Secondary | ICD-10-CM | POA: Diagnosis not present

## 2023-06-20 ENCOUNTER — Other Ambulatory Visit (HOSPITAL_COMMUNITY): Payer: Self-pay | Admitting: Nurse Practitioner

## 2023-06-20 DIAGNOSIS — Z1231 Encounter for screening mammogram for malignant neoplasm of breast: Secondary | ICD-10-CM

## 2023-07-04 ENCOUNTER — Ambulatory Visit (HOSPITAL_COMMUNITY)
Admission: RE | Admit: 2023-07-04 | Discharge: 2023-07-04 | Disposition: A | Discharge status: Home / Self Care | Source: Ambulatory Visit | Attending: Nurse Practitioner | Admitting: Nurse Practitioner

## 2023-07-04 DIAGNOSIS — Z1231 Encounter for screening mammogram for malignant neoplasm of breast: Secondary | ICD-10-CM | POA: Diagnosis not present

## 2023-07-30 ENCOUNTER — Ambulatory Visit: Payer: Medicare HMO | Admitting: Nurse Practitioner

## 2023-07-30 ENCOUNTER — Encounter: Payer: Self-pay | Admitting: Nurse Practitioner

## 2023-07-30 VITALS — BP 130/80 | HR 74 | Temp 98.0°F | Resp 19 | Ht 62.5 in | Wt 229.8 lb

## 2023-07-30 DIAGNOSIS — K219 Gastro-esophageal reflux disease without esophagitis: Secondary | ICD-10-CM | POA: Diagnosis not present

## 2023-07-30 DIAGNOSIS — E039 Hypothyroidism, unspecified: Secondary | ICD-10-CM | POA: Diagnosis not present

## 2023-07-30 DIAGNOSIS — E1169 Type 2 diabetes mellitus with other specified complication: Secondary | ICD-10-CM

## 2023-07-30 DIAGNOSIS — E785 Hyperlipidemia, unspecified: Secondary | ICD-10-CM

## 2023-07-30 DIAGNOSIS — N3946 Mixed incontinence: Secondary | ICD-10-CM | POA: Diagnosis not present

## 2023-07-30 DIAGNOSIS — I10 Essential (primary) hypertension: Secondary | ICD-10-CM

## 2023-07-30 NOTE — Assessment & Plan Note (Signed)
-  education provided on healthy weight loss through increase in physical activity and proper nutrition

## 2023-07-30 NOTE — Assessment & Plan Note (Signed)
 Continues on crestor  with dietary modificatons

## 2023-07-30 NOTE — Assessment & Plan Note (Signed)
Continue lifestyle modifications. 

## 2023-07-30 NOTE — Assessment & Plan Note (Signed)
 Blood pressure well controlled, goal bp <140/90 Continue current medications and dietary modifications follow metabolic panel

## 2023-07-30 NOTE — Assessment & Plan Note (Signed)
 Stable on nexium and continue lifestyle medication

## 2023-07-30 NOTE — Progress Notes (Signed)
 Careteam: Patient Care Team: Verma Gobble, NP as PCP - General (Geriatric Medicine) Amanda Jungling Joyceann No, MD as PCP - Cardiology (Cardiology) Lucendia Rusk, DO (Optometry)  PLACE OF SERVICE:  [facility]  Advanced Directive information Does Patient Have a Medical Advance Directive?: No, Would patient like information on creating a medical advance directive?: No - Patient declined  Allergies  Allergen Reactions   Naprosyn [Naproxen] Nausea Only    Can take Alleve but years ago patient took a liquid form of this medication and it made me nauseated    Chief Complaint  Patient presents with   Medical Management of Chronic Issues    4 month follow up.    HPI:  Discussed the use of AI scribe software for clinical note transcription with the patient, who gave verbal consent to proceed.  History of Present Illness [name] is a 68 year old female with type 2 diabetes who presents for a four-month follow-up and A1c check.  She adheres to dietary modifications and participates in a program called [employer], which involves fasting in the morning, a small lunch, and a regular supper with no snacks. She has lost 5 pounds since her last visit, now weighing 229 pounds, down from 234 pounds. She is not currently taking semaglutide  due to cost but continues metformin  1000 mg twice daily.  She is on Crestor  20 mg daily for hyperlipidemia, no side effects.   Her blood pressure is well-controlled on telmisartan  hydrochlorothiazide .  She takes Nexium daily for indigestion and reports discomfort after eating, described as 'pains this way' that are not severe but uncomfortable. This has been occurring for several months, typically after meals like yogurt with fruit or a sandwich. She notes a decrease in gas since reducing food intake.  She had a partial hysterectomy at age 66 due to stage four cervical cancer and has been cancer-free for over 25 years. She no longer requires follow-up for this  condition.  She had a recent diabetic eye exam with no issues reported and takes AREDS2 vitamins daily for eye health. She has not yet had a colonoscopy, which is not due until 2028. No vaginal bleeding, no blood in stools, and no increase in indigestion symptoms.    Review of Systems:  Review of Systems  Constitutional:  Negative for chills, fever and weight loss.  HENT:  Negative for tinnitus.   Respiratory:  Negative for cough, sputum production and shortness of breath.   Cardiovascular:  Negative for chest pain, palpitations and leg swelling.  Gastrointestinal:  Negative for abdominal pain, constipation, diarrhea and heartburn.  Genitourinary:  Negative for dysuria, frequency and urgency.  Musculoskeletal:  Negative for back pain, falls, joint pain and myalgias.  Skin: Negative.   Neurological:  Negative for dizziness and headaches.  Psychiatric/Behavioral:  Negative for depression and memory loss. The patient does not have insomnia.     Past Medical History:  Diagnosis Date   Arthritis    "fingers occasionally" (08/26/2014)   Asthmatic bronchitis    "haven't used an inhaler in years" (08/26/2014)   Cervical cancer (HCC)    Counseling for estrogen replacement therapy 12/30/2014   Depression    Diabetes mellitus without complication (HCC)    type- 2   Elevated cholesterol    Exertional shortness of breath    GERD (gastroesophageal reflux disease)    Hot flashes 07/05/2012   Hypertension    Hypothyroidism    IFG (impaired fasting glucose)    Mixed stress and  urge urinary incontinence    Moody 12/30/2014   Obesity    Panniculus    PONV (postoperative nausea and vomiting)    Rectocele 07/05/2012   Stress incontinence    Superficial fungus infection of skin    Past Surgical History:  Procedure Laterality Date   ABDOMINAL HYSTERECTOMY  1986   "partial" Uterine cancer   ANKLE FUSION Left 10/24/2013   Procedure: LEFT ANKLE SUBTALAR AND TALONAVICULAR FUSION;  Surgeon: Timothy Ford, MD;  Location: [facility];  Service: Orthopedics;  Laterality: Left;   ANKLE FUSION Left 08/26/2014   Procedure: Left Foot Take Down Non-union with Revision Talonavicular and Subtalar Fusion;  Surgeon: Timothy Ford, MD;  Location: [facility];  Service: Orthopedics;  Laterality: Left;   BACK SURGERY     BILATERAL SALPINGECTOMY  2010   w/LOA   CARPOMETACARPEL SUSPENSION PLASTY Left 10/25/2022   Procedure: LEFT THUMB CARPOMETACARPAL  ARTHROSPLASTY;  Surgeon: Wes Hamman, MD;  Location: [facility];  Service: Orthopedics;  Laterality: Left;   COLONOSCOPY     COLONOSCOPY N/A 07/14/2016   Procedure: COLONOSCOPY;  Surgeon: Alyce Jubilee, MD;  [facility];  Service: Endoscopy;  Laterality: N/A;  8:30   FUSION OF TALONAVICULAR JOINT Left    Take Down Non-union with Revision Talonavicular and Subtalar Fusion /notes 08/26/2014   HAMMER TOE SURGERY Bilateral 2000-2013   right-left   JOINT REPLACEMENT     Right knee   KNEE ARTHROSCOPY Bilateral 2008-2010    left-right   LUMBAR DISC SURGERY  2004   Lumbar 4- 5   POLYPECTOMY  07/14/2016   Procedure: POLYPECTOMY;  Surgeon: Alyce Jubilee, MD;  [facility];  Service: Endoscopy;;  hepatic flexure   SHOULDER ARTHROSCOPY WITH SUBACROMIAL DECOMPRESSION, ROTATOR CUFF REPAIR AND BICEP TENDON REPAIR Right 02/11/2019   Procedure: right shoulde arthroscopy, biceps tenodesis lower trapezius tendon transfer;  Surgeon: Jasmine Mesi, MD;  Location: [facility] OR;  Service: Orthopedics;  Laterality: Right;   TOTAL KNEE ARTHROPLASTY Right 2013   TUBAL LIGATION     Social History:   reports that she quit smoking about 35 years ago. Her smoking use included cigarettes. She started smoking about 45 years ago. She has a 5 pack-year smoking history. She has never used smokeless tobacco. She reports current alcohol use. She reports that she does not use drugs.  Family History  Problem Relation Age of Onset   Heart disease Mother    Stroke  Mother    Heart disease Father    Heart attack Father        MI at age 12   Diabetes Brother    Hypertension Brother    Diabetes Brother    Hypertension Brother    Other Brother        Gastrointestional disease   Cancer Maternal Aunt        cervical   Diabetes Maternal Grandmother    Diabetes Paternal Grandmother     Medications: Patient's Medications  New Prescriptions   No medications on file  Previous Medications   ACETAMINOPHEN  (TYLENOL ) 500 MG TABLET    Take 500 mg by mouth as needed.   CALCIUM  CARBONATE (CALCIUM  600) 600 MG TABS TABLET    Take 1 tablet (600 mg total) by mouth 2 (two) times daily with a meal.   DICLOFENAC  (VOLTAREN ) 75 MG EC TABLET    Take 1 tablet (75 mg total) by mouth 2 (two) times daily.   ESOMEPRAZOLE (NEXIUM)  20 MG PACKET    Take 20 mg by mouth daily.   GLUCOSE BLOOD (ONETOUCH ULTRA) TEST STRIP    Use to check blood sugar twice daily. Dx: E11.9   METFORMIN  (GLUCOPHAGE ) 500 MG TABLET    Take 2 tablets by mouth twice daily   MULTIPLE VITAMINS-MINERALS (PRESERVISION AREDS 2 PO)    Take by mouth. 1 tablet by mouth daily   NP THYROID  60 MG TABLET    TAKE 1 TABLET BY MOUTH DAILY BEFORE BREAKFAST   NYSTATIN  (MYCOSTATIN /NYSTOP ) POWDER    APPLY TO AFFECTED AREA TWICE A DAY   ONETOUCH DELICA LANCETS 33G MISC    Use to check blood sugar twice daily. Dx: E11.9   ROSUVASTATIN  (CRESTOR ) 20 MG TABLET    TAKE 1 TABLET BY MOUTH EVERY DAY   TELMISARTAN -HYDROCHLOROTHIAZIDE  (MICARDIS  HCT) 40-12.5 MG TABLET    TAKE 1 TABLET BY MOUTH EVERY DAY  Modified Medications   No medications on file  Discontinued Medications   SEMAGLUTIDE ,0.25 OR 0.5MG /DOS, 2 MG/3ML SOPN    Inject 0.25 mg into the skin once a week.    Physical Exam:  Vitals:   07/30/23 0900  BP: 130/80  Pulse: 74  Resp: 19  Temp: 98 F (36.7 C)  Weight: 229 lb 12.8 oz (104.2 kg)  Height: 5' 2.5" (1.588 m)   Body mass index is 41.36 kg/m. Wt Readings from Last 3 Encounters:  07/30/23 229 lb 12.8 oz  (104.2 kg)  03/30/23 234 lb (106.1 kg)  11/06/22 230 lb (104.3 kg)    Physical Exam Constitutional:      General: She is not in acute distress.    Appearance: She is well-developed. She is not diaphoretic.  HENT:     Head: Normocephalic and atraumatic.     Mouth/Throat:     Pharynx: No oropharyngeal exudate.  Eyes:     Conjunctiva/sclera: Conjunctivae normal.     Pupils: Pupils are equal, round, and reactive to light.  Cardiovascular:     Rate and Rhythm: Normal rate and regular rhythm.     Heart sounds: Normal heart sounds.  Pulmonary:     Effort: Pulmonary effort is normal.     Breath sounds: Normal breath sounds.  Abdominal:     General: Bowel sounds are normal.     Palpations: Abdomen is soft.  Musculoskeletal:     Cervical back: Normal range of motion and neck supple.     Right lower leg: No edema.     Left lower leg: No edema.  Skin:    General: Skin is warm and dry.  Neurological:     Mental Status: She is alert.  Psychiatric:        Mood and Affect: Mood normal.     Labs reviewed: Basic Metabolic Panel: Recent Labs    09/19/22 0807 10/20/22 1347 03/30/23 1420  NA 138 137 137  K 4.3 4.9 4.7  CL 104 101 102  CO2 24 24 24   GLUCOSE 124* 129* 99  BUN 30* 22 22  CREATININE 1.06* 0.93 0.91  CALCIUM  9.8 9.4 9.5  TSH 7.25*  --  2.61   Liver Function Tests: Recent Labs    09/19/22 0807 03/30/23 1420  AST 17 22  ALT 12 16  BILITOT 0.3 0.3  PROT 7.1 7.2   No results for input(s): "LIPASE", "AMYLASE" in the last 8760 hours. No results for input(s): "AMMONIA" in the last 8760 hours. CBC: Recent Labs    03/30/23 1420  WBC 8.2  NEUTROABS  4,567  HGB 12.2  HCT 37.7  MCV 90.6  PLT 322   Lipid Panel: Recent Labs    09/19/22 0807 03/30/23 1420  CHOL 159 171  HDL 58 50  LDLCALC 81 88  TRIG 103 253*  CHOLHDL 2.7 3.4   TSH: Recent Labs    09/19/22 0807 03/30/23 1420  TSH 7.25* 2.61   A1C: Lab Results  Component Value Date   HGBA1C 7.6  (H) 03/30/2023     Assessment/Plan Type 2 diabetes mellitus with other specified complication, without long-term current use of insulin (HCC) Assessment & Plan: Encouraged dietary compliance, routine foot care/monitoring and to keep up with diabetic eye exams through ophthalmology  Continue on metformin  twice daily She has stopped emaglutide due to cost.   Orders: -     Hemoglobin A1c -     Microalbumin / creatinine urine ratio  Hypothyroidism, unspecified type Assessment & Plan: TSH at goal on last lab, continue NP thyroid  60 mg   Hyperlipidemia LDL goal <70 Assessment & Plan: Continues on crestor  with dietary modificatons   Morbid (severe) obesity due to excess calories (HCC) Assessment & Plan: -education provided on healthy weight loss through increase in physical activity and proper nutrition     Essential hypertension Assessment & Plan: Blood pressure well controlled, goal bp <140/90 Continue current medications and dietary modifications follow metabolic panel  Orders: -     COMPLETE METABOLIC PANEL WITHOUT GFR -     CBC with Differential/Platelet  Gastroesophageal reflux disease without esophagitis Assessment & Plan: Stable on nexium and continue lifestyle medication    Mixed stress and urge urinary incontinence Assessment & Plan: Continue lifestyle modifications      Return in about 4 months (around 11/30/2023) for routine follow up.  Tanishka Drolet K. Denney Fisherman [facility] [phone]

## 2023-07-30 NOTE — Assessment & Plan Note (Signed)
 TSH at goal on last lab, continue NP thyroid  60 mg

## 2023-07-30 NOTE — Assessment & Plan Note (Signed)
 Encouraged dietary compliance, routine foot care/monitoring and to keep up with diabetic eye exams through ophthalmology  Continue on metformin  twice daily She has stopped emaglutide due to cost.

## 2023-07-31 ENCOUNTER — Encounter: Payer: Self-pay | Admitting: Nurse Practitioner

## 2023-07-31 ENCOUNTER — Telehealth: Payer: Self-pay

## 2023-07-31 LAB — COMPLETE METABOLIC PANEL WITHOUT GFR
AG Ratio: 1.7 (calc) (ref 1.0–2.5)
ALT: 18 U/L (ref 6–29)
AST: 28 U/L (ref 10–35)
Albumin: 4.5 g/dL (ref 3.6–5.1)
Alkaline phosphatase (APISO): 55 U/L (ref 37–153)
BUN: 17 mg/dL (ref 7–25)
CO2: 24 mmol/L (ref 20–32)
Calcium: 9.7 mg/dL (ref 8.6–10.4)
Chloride: 107 mmol/L (ref 98–110)
Creat: 0.84 mg/dL (ref 0.50–1.05)
Globulin: 2.7 g/dL (ref 1.9–3.7)
Glucose, Bld: 140 mg/dL — ABNORMAL HIGH (ref 65–99)
Potassium: 4.3 mmol/L (ref 3.5–5.3)
Sodium: 138 mmol/L (ref 135–146)
Total Bilirubin: 0.4 mg/dL (ref 0.2–1.2)
Total Protein: 7.2 g/dL (ref 6.1–8.1)

## 2023-07-31 LAB — CBC WITH DIFFERENTIAL/PLATELET
Absolute Lymphocytes: 1619 {cells}/uL (ref 850–3900)
Absolute Monocytes: 542 {cells}/uL (ref 200–950)
Basophils Absolute: 68 {cells}/uL (ref 0–200)
Basophils Relative: 1.2 %
Eosinophils Absolute: 479 {cells}/uL (ref 15–500)
Eosinophils Relative: 8.4 %
HCT: 36 % (ref 35.0–45.0)
Hemoglobin: 12.1 g/dL (ref 11.7–15.5)
MCH: 30.4 pg (ref 27.0–33.0)
MCHC: 33.6 g/dL (ref 32.0–36.0)
MCV: 90.5 fL (ref 80.0–100.0)
MPV: 10.2 fL (ref 7.5–12.5)
Monocytes Relative: 9.5 %
Neutro Abs: 2993 {cells}/uL (ref 1500–7800)
Neutrophils Relative %: 52.5 %
Platelets: 251 10*3/uL (ref 140–400)
RBC: 3.98 10*6/uL (ref 3.80–5.10)
RDW: 12.8 % (ref 11.0–15.0)
Total Lymphocyte: 28.4 %
WBC: 5.7 10*3/uL (ref 3.8–10.8)

## 2023-07-31 LAB — MICROALBUMIN / CREATININE URINE RATIO
Creatinine, Urine: 26 mg/dL (ref 20–275)
Microalb, Ur: <0.2 mg/dL

## 2023-07-31 LAB — HEMOGLOBIN A1C
Hgb A1c MFr Bld: 6.8 % — ABNORMAL HIGH (ref <5.7)
Mean Plasma Glucose: 148 mg/dL
eAG (mmol/L): 8.2 mmol/L

## 2023-07-31 NOTE — Telephone Encounter (Signed)
 Copied from CRM [id number]. Topic: Clinical - Lab/Test Results >> Jul 31, 2023  9:45 AM Karole Pacer C wrote: Reason for CRM: Patient would like a call back at [phone] to discuss her A1C results.

## 2023-07-31 NOTE — Telephone Encounter (Signed)
 MyChart message sent to patient.

## 2023-08-01 DIAGNOSIS — M9902 Segmental and somatic dysfunction of thoracic region: Secondary | ICD-10-CM | POA: Diagnosis not present

## 2023-08-01 DIAGNOSIS — M9905 Segmental and somatic dysfunction of pelvic region: Secondary | ICD-10-CM | POA: Diagnosis not present

## 2023-08-01 DIAGNOSIS — M62830 Muscle spasm of back: Secondary | ICD-10-CM | POA: Diagnosis not present

## 2023-08-01 DIAGNOSIS — M9903 Segmental and somatic dysfunction of lumbar region: Secondary | ICD-10-CM | POA: Diagnosis not present

## 2023-08-15 DIAGNOSIS — M62830 Muscle spasm of back: Secondary | ICD-10-CM | POA: Diagnosis not present

## 2023-08-15 DIAGNOSIS — M9903 Segmental and somatic dysfunction of lumbar region: Secondary | ICD-10-CM | POA: Diagnosis not present

## 2023-08-15 DIAGNOSIS — M9902 Segmental and somatic dysfunction of thoracic region: Secondary | ICD-10-CM | POA: Diagnosis not present

## 2023-08-15 DIAGNOSIS — M9905 Segmental and somatic dysfunction of pelvic region: Secondary | ICD-10-CM | POA: Diagnosis not present

## 2023-08-29 DIAGNOSIS — M9903 Segmental and somatic dysfunction of lumbar region: Secondary | ICD-10-CM | POA: Diagnosis not present

## 2023-08-29 DIAGNOSIS — M9905 Segmental and somatic dysfunction of pelvic region: Secondary | ICD-10-CM | POA: Diagnosis not present

## 2023-08-29 DIAGNOSIS — M9902 Segmental and somatic dysfunction of thoracic region: Secondary | ICD-10-CM | POA: Diagnosis not present

## 2023-08-29 DIAGNOSIS — M62830 Muscle spasm of back: Secondary | ICD-10-CM | POA: Diagnosis not present

## 2023-09-05 DIAGNOSIS — M9902 Segmental and somatic dysfunction of thoracic region: Secondary | ICD-10-CM | POA: Diagnosis not present

## 2023-09-05 DIAGNOSIS — M62830 Muscle spasm of back: Secondary | ICD-10-CM | POA: Diagnosis not present

## 2023-09-05 DIAGNOSIS — M9905 Segmental and somatic dysfunction of pelvic region: Secondary | ICD-10-CM | POA: Diagnosis not present

## 2023-09-05 DIAGNOSIS — M9903 Segmental and somatic dysfunction of lumbar region: Secondary | ICD-10-CM | POA: Diagnosis not present

## 2023-09-11 DIAGNOSIS — M9903 Segmental and somatic dysfunction of lumbar region: Secondary | ICD-10-CM | POA: Diagnosis not present

## 2023-09-11 DIAGNOSIS — M9905 Segmental and somatic dysfunction of pelvic region: Secondary | ICD-10-CM | POA: Diagnosis not present

## 2023-09-11 DIAGNOSIS — M9902 Segmental and somatic dysfunction of thoracic region: Secondary | ICD-10-CM | POA: Diagnosis not present

## 2023-09-11 DIAGNOSIS — M62830 Muscle spasm of back: Secondary | ICD-10-CM | POA: Diagnosis not present

## 2023-09-26 ENCOUNTER — Other Ambulatory Visit: Payer: Self-pay | Admitting: Adult Health

## 2023-09-26 ENCOUNTER — Other Ambulatory Visit: Payer: Self-pay | Admitting: Nurse Practitioner

## 2023-09-26 DIAGNOSIS — I10 Essential (primary) hypertension: Secondary | ICD-10-CM

## 2023-09-27 DIAGNOSIS — H524 Presbyopia: Secondary | ICD-10-CM | POA: Diagnosis not present

## 2023-09-27 DIAGNOSIS — H52209 Unspecified astigmatism, unspecified eye: Secondary | ICD-10-CM | POA: Diagnosis not present

## 2023-09-27 DIAGNOSIS — H5203 Hypermetropia, bilateral: Secondary | ICD-10-CM | POA: Diagnosis not present

## 2023-10-09 ENCOUNTER — Ambulatory Visit: Admitting: Orthopaedic Surgery

## 2023-10-09 DIAGNOSIS — M65311 Trigger thumb, right thumb: Secondary | ICD-10-CM | POA: Diagnosis not present

## 2023-10-09 MED ORDER — METHYLPREDNISOLONE ACETATE 40 MG/ML IJ SUSP
13.3300 mg | INTRAMUSCULAR | Status: AC | PRN
Start: 2023-10-09 — End: 2023-10-09
  Administered 2023-10-09: 13.33 mg

## 2023-10-09 MED ORDER — BUPIVACAINE HCL 0.5 % IJ SOLN
0.3300 mL | INTRAMUSCULAR | Status: AC | PRN
Start: 2023-10-09 — End: 2023-10-09
  Administered 2023-10-09: .33 mL

## 2023-10-09 MED ORDER — LIDOCAINE HCL 1 % IJ SOLN
0.3000 mL | INTRAMUSCULAR | Status: AC | PRN
Start: 2023-10-09 — End: 2023-10-09
  Administered 2023-10-09: .3 mL

## 2023-10-09 NOTE — Progress Notes (Signed)
 Office Visit Note   Patient: [name]           Date of Birth: September 17, 1955           MRN: [id number] Visit Date: 10/09/2023              Requested by: Caro Harlene POUR, NP [address] [place],  [place] [address] PCP: Caro Harlene POUR, NP   Assessment & Plan: Visit Diagnoses:  1. Trigger thumb, right thumb     Plan: History of Present Illness The patient, with diabetes, presents with recurrence of trigger finger symptoms in the thumb.  Trigger finger symptoms in the thumb have recurred, with a previous injection in November providing relief until three months ago. Symptoms include a 'little stick' sensation upon waking, with the thumb locking in a bent position before popping back. Pain and locking are localized at the base of the thumb.  Diabetes is managed with medication. There is a history of arthritis, with prior surgery on a different finger and thumb involving significant bone removal due to arthritis.  Physical Exam MUSCULOSKELETAL: Tenderness and obvious triggering at the base of the thumb.  Assessment and Plan Trigger finger Recurrent trigger finger in the thumb, previously treated with injection. Symptoms include triggering and tenderness at the base of the thumb. 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 - Administer second injection for trigger finger. - Discuss potential for surgery if symptoms recur after second injection.  Follow-Up Instructions: No follow-ups on file.   Orders:  No orders of the defined types were placed in this encounter.  No orders of the defined types were placed in this encounter.     Procedures: Hand/UE Inj: R thumb A1 for trigger finger on 10/09/2023 8:33 AM Indications: pain Details: 25 G needle Medications: 0.3 mL lidocaine  1 %; 0.33 mL bupivacaine  0.5 %; 13.33 mg methylPREDNISolone  acetate 40 MG/ML Outcome: tolerated well, no immediate complications Consent was given by the patient. Patient was prepped and draped in the  usual sterile fashion.       Clinical Data: No additional findings.   Subjective: Chief Complaint  Patient presents with   Right Thumb - Pain    HPI  Review of Systems   Objective: Vital Signs: There were no vitals taken for this visit.  Physical Exam  Ortho Exam  Specialty Comments:  No specialty comments available.  Imaging: No results found.   PMFS History: Patient Active Problem List   Diagnosis Date Noted   Gastroesophageal reflux disease without esophagitis 07/30/2023   Primary osteoarthritis of first carpometacarpal joint of left hand 10/25/2022   Morbid (severe) obesity due to excess calories (HCC) 07/30/2018   Hypothyroidism 09/19/2017   Mixed stress and urge urinary incontinence 02/10/2014   Diabetes (HCC) 01/18/2014   Hyperlipidemia LDL goal <70 03/10/2013   Rectocele 07/05/2012   Essential hypertension 07/04/2012   Past Medical History:  Diagnosis Date   Arthritis    fingers occasionally (08/26/2014)   Asthmatic bronchitis    haven't used an inhaler in years (08/26/2014)   Cervical cancer (HCC)    Counseling for estrogen replacement therapy 12/30/2014   Depression    Diabetes mellitus without complication (HCC)    type- 2   Elevated cholesterol    Exertional shortness of breath    GERD (gastroesophageal reflux disease)    Hot flashes 07/05/2012   Hypertension    Hypothyroidism    IFG (impaired fasting glucose)    Mixed stress and urge urinary incontinence  Moody 12/30/2014   Obesity    Panniculus    PONV (postoperative nausea and vomiting)    Rectocele 07/05/2012   Stress incontinence    Superficial fungus infection of skin     Family History  Problem Relation Age of Onset   Heart disease Mother    Stroke Mother    Heart disease Father    Heart attack Father        MI at age 60   Diabetes Brother    Hypertension Brother    Diabetes Brother    Hypertension Brother    Other Brother        Gastrointestional disease    Cancer Maternal Aunt        cervical   Diabetes Maternal Grandmother    Diabetes Paternal Grandmother     Past Surgical History:  Procedure Laterality Date   ABDOMINAL HYSTERECTOMY  1986   partial Uterine cancer   ANKLE FUSION Left 10/24/2013   Procedure: LEFT ANKLE SUBTALAR AND TALONAVICULAR FUSION;  Surgeon: Jerona Harden GAILS, MD;  Location: [facility] OR;  Service: Orthopedics;  Laterality: Left;   ANKLE FUSION Left 08/26/2014   Procedure: Left Foot Take Down Non-union with Revision Talonavicular and Subtalar Fusion;  Surgeon: Jerona Harden GAILS, MD;  Location: [facility] OR;  Service: Orthopedics;  Laterality: Left;   BACK SURGERY     BILATERAL SALPINGECTOMY  2010   w/LOA   CARPOMETACARPEL SUSPENSION PLASTY Left 10/25/2022   Procedure: LEFT THUMB CARPOMETACARPAL  ARTHROSPLASTY;  Surgeon: Jerri Kay HERO, MD;  Location: [facility];  Service: Orthopedics;  Laterality: Left;   COLONOSCOPY     COLONOSCOPY N/A 07/14/2016   Procedure: COLONOSCOPY;  Surgeon: Margo LITTIE Haddock, MD;  [facility]: [facility];  Service: Endoscopy;  Laterality: N/A;  8:30   FUSION OF TALONAVICULAR JOINT Left    Take Down Non-union with Revision Talonavicular and Subtalar Fusion /notes 08/26/2014   HAMMER TOE SURGERY Bilateral 2000-2013   right-left   JOINT REPLACEMENT     Right knee   KNEE ARTHROSCOPY Bilateral 2008-2010    left-right   LUMBAR DISC SURGERY  2004   Lumbar 4- 5   POLYPECTOMY  07/14/2016   Procedure: POLYPECTOMY;  Surgeon: Margo LITTIE Haddock, MD;  Location: [facility];  Service: Endoscopy;;  hepatic flexure   SHOULDER ARTHROSCOPY WITH SUBACROMIAL DECOMPRESSION, ROTATOR CUFF REPAIR AND BICEP TENDON REPAIR Right 02/11/2019   Procedure: right shoulde arthroscopy, biceps tenodesis lower trapezius tendon transfer;  Surgeon: Addie Cordella Hamilton, MD;  Location: [facility] OR;  Service: Orthopedics;  Laterality: Right;   TOTAL KNEE ARTHROPLASTY Right 2013   TUBAL LIGATION     Social History   Occupational History   Not on  file  Tobacco Use   Smoking status: Former    Current packs/day: 0.00    Average packs/day: 0.5 packs/day for 10.0 years (5.0 ttl pk-yrs)    Types: Cigarettes    Start date: 03/27/1978    Quit date: 03/27/1988    Years since quitting: 35.5   Smokeless tobacco: Never  Vaping Use   Vaping status: Never Used  Substance and Sexual Activity   Alcohol use: Yes    Comment: occasionally a beer   Drug use: No   Sexual activity: Not Currently    Birth control/protection: Surgical    Comment: hyst

## 2023-11-12 ENCOUNTER — Encounter: Payer: Medicare HMO | Admitting: Nurse Practitioner

## 2023-11-28 ENCOUNTER — Other Ambulatory Visit: Payer: Self-pay | Admitting: Nurse Practitioner

## 2023-11-28 DIAGNOSIS — E785 Hyperlipidemia, unspecified: Secondary | ICD-10-CM

## 2023-11-29 NOTE — Patient Instructions (Addendum)
 1.) Please schedule your annual wellness visit.

## 2023-11-30 ENCOUNTER — Encounter: Admitting: Nurse Practitioner

## 2023-11-30 VITALS — Ht 62.5 in

## 2023-11-30 DIAGNOSIS — Z23 Encounter for immunization: Secondary | ICD-10-CM

## 2023-11-30 NOTE — Progress Notes (Signed)
 This encounter was created in error - please disregard.

## 2024-01-14 ENCOUNTER — Other Ambulatory Visit: Payer: Self-pay | Admitting: Nurse Practitioner

## 2024-01-28 ENCOUNTER — Encounter: Payer: Self-pay | Admitting: Radiology

## 2024-02-15 ENCOUNTER — Encounter: Payer: Self-pay | Admitting: Nurse Practitioner

## 2024-02-15 ENCOUNTER — Ambulatory Visit (INDEPENDENT_AMBULATORY_CARE_PROVIDER_SITE_OTHER): Payer: Self-pay | Admitting: Nurse Practitioner

## 2024-02-15 VITALS — BP 118/66 | HR 60 | Temp 96.6°F | Resp 18 | Ht 62.5 in | Wt 219.0 lb

## 2024-02-15 DIAGNOSIS — E1169 Type 2 diabetes mellitus with other specified complication: Secondary | ICD-10-CM | POA: Diagnosis not present

## 2024-02-15 DIAGNOSIS — Z7984 Long term (current) use of oral hypoglycemic drugs: Secondary | ICD-10-CM

## 2024-02-15 DIAGNOSIS — E039 Hypothyroidism, unspecified: Secondary | ICD-10-CM | POA: Diagnosis not present

## 2024-02-15 DIAGNOSIS — I10 Essential (primary) hypertension: Secondary | ICD-10-CM | POA: Diagnosis not present

## 2024-02-15 DIAGNOSIS — E785 Hyperlipidemia, unspecified: Secondary | ICD-10-CM

## 2024-02-15 DIAGNOSIS — K219 Gastro-esophageal reflux disease without esophagitis: Secondary | ICD-10-CM

## 2024-02-15 LAB — CBC WITH DIFFERENTIAL/PLATELET
Absolute Lymphocytes: 1944 {cells}/uL (ref 850–3900)
Absolute Monocytes: 783 {cells}/uL (ref 200–950)
Basophils Absolute: 72 {cells}/uL (ref 0–200)
Basophils Relative: 0.8 %
Eosinophils Absolute: 243 {cells}/uL (ref 15–500)
Eosinophils Relative: 2.7 %
HCT: 37.2 % (ref 35.0–45.0)
Hemoglobin: 12.2 g/dL (ref 11.7–15.5)
MCH: 30.2 pg (ref 27.0–33.0)
MCHC: 32.8 g/dL (ref 32.0–36.0)
MCV: 92.1 fL (ref 80.0–100.0)
MPV: 10.3 fL (ref 7.5–12.5)
Monocytes Relative: 8.7 %
Neutro Abs: 5958 {cells}/uL (ref 1500–7800)
Neutrophils Relative %: 66.2 %
Platelets: 265 Thousand/uL (ref 140–400)
RBC: 4.04 Million/uL (ref 3.80–5.10)
RDW: 13 % (ref 11.0–15.0)
Total Lymphocyte: 21.6 %
WBC: 9 Thousand/uL (ref 3.8–10.8)

## 2024-02-15 LAB — HEMOGLOBIN A1C
Hgb A1c MFr Bld: 6.1 % — ABNORMAL HIGH (ref <5.7)
Mean Plasma Glucose: 128 mg/dL
eAG (mmol/L): 7.1 mmol/L

## 2024-02-15 LAB — COMPREHENSIVE METABOLIC PANEL WITH GFR
AG Ratio: 1.7 (calc) (ref 1.0–2.5)
ALT: 18 U/L (ref 6–29)
AST: 27 U/L (ref 10–35)
Albumin: 4.4 g/dL (ref 3.6–5.1)
Alkaline phosphatase (APISO): 52 U/L (ref 37–153)
BUN: 16 mg/dL (ref 7–25)
CO2: 24 mmol/L (ref 20–32)
Calcium: 9.2 mg/dL (ref 8.6–10.4)
Chloride: 104 mmol/L (ref 98–110)
Creat: 0.87 mg/dL (ref 0.50–1.05)
Globulin: 2.6 g/dL (ref 1.9–3.7)
Glucose, Bld: 121 mg/dL (ref 65–139)
Potassium: 4.5 mmol/L (ref 3.5–5.3)
Sodium: 137 mmol/L (ref 135–146)
Total Bilirubin: 0.5 mg/dL (ref 0.2–1.2)
Total Protein: 7 g/dL (ref 6.1–8.1)
eGFR: 73 mL/min/1.73m2 (ref >=60)

## 2024-02-15 LAB — LIPID PANEL
Cholesterol: 147 mg/dL (ref <200)
HDL: 50 mg/dL (ref >=50)
LDL Cholesterol (Calc): 75 mg/dL
Non-HDL Cholesterol (Calc): 97 mg/dL (ref <130)
Total CHOL/HDL Ratio: 2.9 (calc) (ref <5.0)
Triglycerides: 139 mg/dL (ref <150)

## 2024-02-15 LAB — TSH: TSH: 2.25 m[IU]/L (ref 0.40–4.50)

## 2024-02-15 NOTE — Progress Notes (Signed)
 Careteam: Patient Care Team: Angelica Harlene POUR, NP as PCP - General (Geriatric Medicine) Alvan Dorn FALCON, MD as PCP - Cardiology (Cardiology) Darroll Anes, DO (Optometry)  PLACE OF SERVICE:  [facility]  Advanced Directive information    Allergies  Allergen Reactions   Naprosyn [Naproxen] Nausea Only    Can take Alleve but years ago patient took a liquid form of this medication and it made me nauseated    Chief Complaint  Patient presents with   Medical Management of Chronic Issues    Routine Checkup     HPI:  Discussed the use of AI scribe software for clinical note transcription with the patient, who gave verbal consent to proceed.  History of Present Illness [name] is a 68 year old female with diabetes and hypothyroidism who presents for a follow-up visit.  Over the past six months, she has lost 10 pounds, going from 229 pounds in May to 219 pounds currently. She attributes this weight loss to dietary changes, including drinking a product called Unicity twice daily, which she believes helps curb her appetite. She denies using any medications or injections for weight loss (due to cost) and engages in exercise twice a week at a senior club.  She is currently on metformin  500 mg, taking two tablets twice daily for diabetes management. Her blood sugar levels in the morning range from 130 to 160 mg/dL, but can exceed 799 mg/dL depending on her evening meals. Historically, her blood sugar levels have been high. No numbness or tingling in her feet, except for her toe, which has been present since surgery.  She is on NP thyroid  60 mg daily for hypothyroidism. She is concerned about not losing weight quickly enough, although she acknowledges that slower weight loss is generally better.  She denies any worsening of acid reflux while on Nexium   no abnormal joint aches or pains, although she experiences stiffness in her shoulder.   She reports frequent urination, which  she attributes to high fluid intake, including water  and coffee, and questions whether she is fully emptying her bladder. No issues with constipation or diarrhea, although her stools are loose but manageable.   She is on telmisartan  hydrochlorothiazide  for blood pressure   She takes crestor  20 mg daily for cholesterol management.     Review of Systems:  Review of Systems  Constitutional:  Negative for chills, fever and weight loss.  HENT:  Negative for tinnitus.   Respiratory:  Negative for cough, sputum production and shortness of breath.   Cardiovascular:  Negative for chest pain, palpitations and leg swelling.  Gastrointestinal:  Negative for abdominal pain, constipation, diarrhea and heartburn.  Genitourinary:  Negative for dysuria, frequency and urgency.  Musculoskeletal:  Negative for back pain, falls, joint pain and myalgias.  Skin: Negative.   Neurological:  Negative for dizziness and headaches.  Psychiatric/Behavioral:  Negative for depression and memory loss. The patient does not have insomnia.     Past Medical History:  Diagnosis Date   Arthritis    fingers occasionally (08/26/2014)   Asthmatic bronchitis    haven't used an inhaler in years (08/26/2014)   Cervical cancer (HCC)    Counseling for estrogen replacement therapy 12/30/2014   Depression    Diabetes mellitus without complication (HCC)    type- 2   Elevated cholesterol    Exertional shortness of breath    GERD (gastroesophageal reflux disease)    Hot flashes 07/05/2012   Hypertension    Hypothyroidism  IFG (impaired fasting glucose)    Mixed stress and urge urinary incontinence    Moody 12/30/2014   Obesity    Panniculus    PONV (postoperative nausea and vomiting)    Rectocele 07/05/2012   Stress incontinence    Superficial fungus infection of skin    Past Surgical History:  Procedure Laterality Date   ABDOMINAL HYSTERECTOMY  1986   partial Uterine cancer   ANKLE FUSION Left 10/24/2013    Procedure: LEFT ANKLE SUBTALAR AND TALONAVICULAR FUSION;  Surgeon: Jerona Harden GAILS, MD;  Location: [facility];  Service: Orthopedics;  Laterality: Left;   ANKLE FUSION Left 08/26/2014   Procedure: Left Foot Take Down Non-union with Revision Talonavicular and Subtalar Fusion;  Surgeon: Jerona Harden GAILS, MD;  Location: [facility];  Service: Orthopedics;  Laterality: Left;   BACK SURGERY     BILATERAL SALPINGECTOMY  2010   w/LOA   CARPOMETACARPEL SUSPENSION PLASTY Left 10/25/2022   Procedure: LEFT THUMB CARPOMETACARPAL  ARTHROSPLASTY;  Surgeon: Jerri Kay HERO, MD;  Location: [facility];  Service: Orthopedics;  Laterality: Left;   COLONOSCOPY     COLONOSCOPY N/A 07/14/2016   Procedure: COLONOSCOPY;  Surgeon: Margo LITTIE Haddock, MD;  [facility];  Service: Endoscopy;  Laterality: N/A;  8:30   FUSION OF TALONAVICULAR JOINT Left    Take Down Non-union with Revision Talonavicular and Subtalar Fusion /notes 08/26/2014   HAMMER TOE SURGERY Bilateral 2000-2013   right-left   JOINT REPLACEMENT     Right knee   KNEE ARTHROSCOPY Bilateral 2008-2010    left-right   LUMBAR DISC SURGERY  2004   Lumbar 4- 5   POLYPECTOMY  07/14/2016   Procedure: POLYPECTOMY;  Surgeon: Margo LITTIE Haddock, MD;  [facility];  Service: Endoscopy;;  hepatic flexure   SHOULDER ARTHROSCOPY WITH SUBACROMIAL DECOMPRESSION, ROTATOR CUFF REPAIR AND BICEP TENDON REPAIR Right 02/11/2019   Procedure: right shoulde arthroscopy, biceps tenodesis lower trapezius tendon transfer;  Surgeon: Addie Cordella Hamilton, MD;  Location: [facility] OR;  Service: Orthopedics;  Laterality: Right;   TOTAL KNEE ARTHROPLASTY Right 2013   TUBAL LIGATION     Social History:   reports that she quit smoking about 35 years ago. Her smoking use included cigarettes. She started smoking about 45 years ago. She has a 5 pack-year smoking history. She has never used smokeless tobacco. She reports current alcohol use. She reports that she does not use drugs.  Family  History  Problem Relation Age of Onset   Heart disease Mother    Stroke Mother    Heart disease Father    Heart attack Father        MI at age 10   Diabetes Brother    Hypertension Brother    Diabetes Brother    Hypertension Brother    Other Brother        Gastrointestional disease   Cancer Maternal Aunt        cervical   Diabetes Maternal Grandmother    Diabetes Paternal Grandmother     Medications: Patient's Medications  New Prescriptions   No medications on file  Previous Medications   ACETAMINOPHEN  (TYLENOL ) 500 MG TABLET    Take 500 mg by mouth as needed.   CALCIUM  CARBONATE (CALCIUM  600) 600 MG TABS TABLET    Take 1 tablet (600 mg total) by mouth 2 (two) times daily with a meal.   DICLOFENAC  (VOLTAREN ) 75 MG EC TABLET    Take 1 tablet (75 mg total)  by mouth 2 (two) times daily.   ESOMEPRAZOLE (NEXIUM) 20 MG PACKET    Take 20 mg by mouth daily.   GLUCOSE BLOOD (ONETOUCH ULTRA) TEST STRIP    Use to check blood sugar twice daily. Dx: E11.9   METFORMIN  (GLUCOPHAGE ) 500 MG TABLET    Take 2 tablets by mouth twice daily   MULTIPLE VITAMINS-MINERALS (PRESERVISION AREDS 2 PO)    Take by mouth. 1 tablet by mouth daily   NP THYROID  60 MG TABLET    TAKE 1 TABLET BY MOUTH ONCE DAILY BEFORE BREAKFAST   NYSTATIN  (MYCOSTATIN /NYSTOP ) POWDER    APPLY TO AFFECTED AREA TWICE A DAY   ONETOUCH DELICA LANCETS 33G MISC    Use to check blood sugar twice daily. Dx: E11.9   ROSUVASTATIN  (CRESTOR ) 20 MG TABLET    TAKE 1 TABLET BY MOUTH EVERY DAY   TELMISARTAN -HYDROCHLOROTHIAZIDE  (MICARDIS  HCT) 40-12.5 MG TABLET    TAKE 1 TABLET BY MOUTH EVERY DAY  Modified Medications   No medications on file  Discontinued Medications   No medications on file    Physical Exam:  Vitals:   02/15/24 1254  BP: 118/66  Pulse: 60  Resp: 18  Temp: (!) 96.6 F (35.9 C)  SpO2: 90%  Weight: 219 lb (99.3 kg)  Height: 5' 2.5 (1.588 m)   Body mass index is 39.42 kg/m. Wt Readings from Last 3 Encounters:   02/15/24 219 lb (99.3 kg)  07/30/23 229 lb 12.8 oz (104.2 kg)  03/30/23 234 lb (106.1 kg)    Physical Exam Constitutional:      General: She is not in acute distress.    Appearance: She is well-developed. She is not diaphoretic.  HENT:     Head: Normocephalic and atraumatic.     Mouth/Throat:     Pharynx: No oropharyngeal exudate.  Eyes:     Conjunctiva/sclera: Conjunctivae normal.     Pupils: Pupils are equal, round, and reactive to light.  Cardiovascular:     Rate and Rhythm: Normal rate and regular rhythm.     Heart sounds: Normal heart sounds.  Pulmonary:     Effort: Pulmonary effort is normal.     Breath sounds: Normal breath sounds.  Abdominal:     General: Bowel sounds are normal.     Palpations: Abdomen is soft.  Musculoskeletal:     Cervical back: Normal range of motion and neck supple.     Right lower leg: No edema.     Left lower leg: No edema.  Skin:    General: Skin is warm and dry.  Neurological:     Mental Status: She is alert.  Psychiatric:        Mood and Affect: Mood normal.     Labs reviewed: Basic Metabolic Panel: Recent Labs    03/30/23 1420 07/30/23 0938  NA 137 138  K 4.7 4.3  CL 102 107  CO2 24 24  GLUCOSE 99 140*  BUN 22 17  CREATININE 0.91 0.84  CALCIUM  9.5 9.7  TSH 2.61  --    Liver Function Tests: Recent Labs    03/30/23 1420 07/30/23 0938  AST 22 28  ALT 16 18  BILITOT 0.3 0.4  PROT 7.2 7.2   No results for input(s): LIPASE, AMYLASE in the last 8760 hours. No results for input(s): AMMONIA in the last 8760 hours. CBC: Recent Labs    03/30/23 1420 07/30/23 0938  WBC 8.2 5.7  NEUTROABS 4,567 2,993  HGB 12.2 12.1  HCT 37.7  36.0  MCV 90.6 90.5  PLT 322 251   Lipid Panel: Recent Labs    03/30/23 1420  CHOL 171  HDL 50  LDLCALC 88  TRIG 253*  CHOLHDL 3.4   TSH: Recent Labs    03/30/23 1420  TSH 2.61   A1C: Lab Results  Component Value Date   HGBA1C 6.8 (H) 07/30/2023      Assessment/Plan  Assessment & Plan Type 2 diabetes mellitus Blood sugars range from 130 to 160 mg/dL, occasionally exceeding 200 mg/dL postprandially. Discussed hyperglycemia risks. - Ordered A1c test. - Continue metformin  1000 mg twice daily. - Encouraged dietary modifications to include more protein. -dicussed use of GLP1 but reports insurance does not cover at this time She had great results with monjauro and if new insurance covers better will restart next year.  Hypothyroidism Currently on levothyroxine 60 mg daily. Due for TSH check in January. - Ordered TSH test.  Morbid obesity Weight decreased from 229 lbs to 119 lbs. Engages in exercise and uses Unicity for appetite suppression. Discussed potential weight loss plateau. - Encouraged increased protein intake in diet. -continue activity.   Essential hypertension Blood pressure well-controlled on telmisartan  hydrochlorothiazide . - Continue telmisartan  hydrochlorothiazide .  Hyperlipidemia On Crestor  20 mg daily. Due for lipid panel follow-up. - Ordered lipid panel.  Gastroesophageal reflux disease (GERD) No worsening of acid reflux symptoms. Currently on Nexium. - Continue Nexium.  General Health Maintenance Due for Medicare annual wellness visit. Discussed importance of staying up to date with screenings. - Scheduled annual wellness visit for December 29th. - Ensure up-to-date on colonoscopy, mammograms, and bone density screenings.   Return in about 6 months (around 08/14/2024) for routine follow up.:  Anela Bensman K. Angelica BODILY [facility] [phone]

## 2024-02-16 ENCOUNTER — Other Ambulatory Visit: Payer: Self-pay | Admitting: Nurse Practitioner

## 2024-02-18 ENCOUNTER — Ambulatory Visit: Payer: Self-pay | Admitting: Nurse Practitioner

## 2024-03-15 ENCOUNTER — Other Ambulatory Visit: Payer: Self-pay | Admitting: Nurse Practitioner

## 2024-03-17 NOTE — Telephone Encounter (Signed)
 High risk warning populated when attempting to refill medications, will send to provider for pending  review and approval

## 2024-03-19 ENCOUNTER — Encounter: Payer: Self-pay | Admitting: Pharmacist

## 2024-03-19 NOTE — Progress Notes (Signed)
" ° °  Pharmacy Quality Measure Review  This patient is appearing on a report for being at risk of failing the adherence measure for diabetes medications this calendar year.   Medication: Metformin   Last fill date: 03/18/24 for 90 day supply  Insurance report was not up to date. No action needed at this time.    Cassius DOROTHA Brought, PharmD, BCACP Clinical Pharmacist [phone]   "

## 2024-03-24 ENCOUNTER — Encounter: Admitting: Nurse Practitioner

## 2024-03-24 ENCOUNTER — Encounter: Payer: Self-pay | Admitting: Nurse Practitioner

## 2024-03-24 NOTE — Progress Notes (Signed)
 This encounter was created in error - please disregard.

## 2024-03-25 NOTE — Progress Notes (Signed)
 This encounter was created in error - please disregard.  This encounter was created in error - please disregard.

## 2024-04-29 ENCOUNTER — Telehealth: Payer: Self-pay

## 2024-04-29 DIAGNOSIS — E039 Hypothyroidism, unspecified: Secondary | ICD-10-CM

## 2024-04-29 NOTE — Telephone Encounter (Signed)
 Would not recommend stopping NP thyroid  without another thyroid  replacement. We can change her to synthroid  but she will need to be followed closely with lab work while we make the transition because medications are slightly different.  When she is also out of her NP thyroid  to notify us  so we can send synthroid  in- would recommend starting at 125 mcg daily and following up a TSH in 6 weeks.

## 2024-04-30 NOTE — Telephone Encounter (Signed)
 Left a detail voicemail in regards to Angelica Harlene POUR, [name] response for her thyroid  medication. Please see previous encounter that Angelica Harlene POUR, [name] responded.   Message sent to Suellen Seitz, CMA

## 2024-05-01 MED ORDER — LEVOTHYROXINE SODIUM 125 MCG PO TABS: 125.0000 ug | ORAL_TABLET | Freq: Every day | ORAL | 5 refills | Status: AC

## 2024-05-01 NOTE — Addendum Note (Signed)
 Addended by: SUELLEN PRICILLA BROCKS on: 05/01/2024 09:39 AM   Modules accepted: Orders

## 2024-05-01 NOTE — Telephone Encounter (Signed)
 Spoke with patient, patient states she has 1 week left of NP Thyroid  and asked that we go ahead and send the synthroid . Lab appointment scheduled for 7 weeks

## 2024-06-19 ENCOUNTER — Other Ambulatory Visit
